# Patient Record
Sex: Male | Born: 1940 | Race: White | Hispanic: No | Marital: Married | Smoking: Former smoker
Health system: Southern US, Community
[De-identification: ages and names within clinical notes are randomized; demographics above are authoritative.]

## PROBLEM LIST (undated history)

## (undated) ENCOUNTER — Ambulatory Visit (HOSPITAL_BASED_OUTPATIENT_CLINIC_OR_DEPARTMENT_OTHER)

## (undated) DIAGNOSIS — I499 Cardiac arrhythmia, unspecified: Secondary | ICD-10-CM

## (undated) DIAGNOSIS — I2510 Atherosclerotic heart disease of native coronary artery without angina pectoris: Secondary | ICD-10-CM

## (undated) DIAGNOSIS — I509 Heart failure, unspecified: Secondary | ICD-10-CM

## (undated) DIAGNOSIS — R55 Syncope and collapse: Secondary | ICD-10-CM

## (undated) DIAGNOSIS — I10 Essential (primary) hypertension: Secondary | ICD-10-CM

## (undated) DIAGNOSIS — E039 Hypothyroidism, unspecified: Secondary | ICD-10-CM

## (undated) DIAGNOSIS — I2699 Other pulmonary embolism without acute cor pulmonale: Secondary | ICD-10-CM

## (undated) DIAGNOSIS — K219 Gastro-esophageal reflux disease without esophagitis: Secondary | ICD-10-CM

## (undated) DIAGNOSIS — R0600 Dyspnea, unspecified: Secondary | ICD-10-CM

## (undated) DIAGNOSIS — G4733 Obstructive sleep apnea (adult) (pediatric): Secondary | ICD-10-CM

## (undated) DIAGNOSIS — I219 Acute myocardial infarction, unspecified: Secondary | ICD-10-CM

## (undated) HISTORY — DX: Obstructive sleep apnea (adult) (pediatric): G47.33

## (undated) HISTORY — PX: CORONARY STENT PLACEMENT: SHX1402

## (undated) HISTORY — DX: Hypothyroidism, unspecified: E03.9

## (undated) HISTORY — DX: Other pulmonary embolism without acute cor pulmonale: I26.99

## (undated) HISTORY — DX: Syncope and collapse: R55

## (undated) HISTORY — DX: Essential (primary) hypertension: I10

## (undated) HISTORY — DX: Heart failure, unspecified: I50.9

## (undated) HISTORY — DX: Gastro-esophageal reflux disease without esophagitis: K21.9

---
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
## 1977-11-09 HISTORY — PX: HERNIA REPAIR: SHX51

## 2010-06-11 HISTORY — PX: CATARACT EXTRACTION: SUR2

## 2011-06-12 HISTORY — PX: COLONOSCOPY: SHX174

## 2011-06-18 DIAGNOSIS — M25559 Pain in unspecified hip: Secondary | ICD-10-CM | POA: Diagnosis not present

## 2011-06-18 DIAGNOSIS — K2100 Gastro-esophageal reflux disease with esophagitis, without bleeding: Secondary | ICD-10-CM | POA: Diagnosis not present

## 2011-06-18 DIAGNOSIS — K5732 Diverticulitis of large intestine without perforation or abscess without bleeding: Secondary | ICD-10-CM | POA: Diagnosis not present

## 2011-08-03 DIAGNOSIS — J45909 Unspecified asthma, uncomplicated: Secondary | ICD-10-CM | POA: Diagnosis not present

## 2011-08-03 DIAGNOSIS — J309 Allergic rhinitis, unspecified: Secondary | ICD-10-CM | POA: Diagnosis not present

## 2011-08-03 DIAGNOSIS — G4710 Hypersomnia, unspecified: Secondary | ICD-10-CM | POA: Diagnosis not present

## 2011-08-03 DIAGNOSIS — G4730 Sleep apnea, unspecified: Secondary | ICD-10-CM | POA: Diagnosis not present

## 2011-08-03 DIAGNOSIS — G2581 Restless legs syndrome: Secondary | ICD-10-CM | POA: Diagnosis not present

## 2011-08-06 DIAGNOSIS — G4730 Sleep apnea, unspecified: Secondary | ICD-10-CM | POA: Diagnosis not present

## 2011-08-06 DIAGNOSIS — G4710 Hypersomnia, unspecified: Secondary | ICD-10-CM | POA: Diagnosis not present

## 2011-08-21 DIAGNOSIS — L570 Actinic keratosis: Secondary | ICD-10-CM | POA: Diagnosis not present

## 2011-08-21 DIAGNOSIS — C44319 Basal cell carcinoma of skin of other parts of face: Secondary | ICD-10-CM | POA: Diagnosis not present

## 2011-08-28 DIAGNOSIS — K5900 Constipation, unspecified: Secondary | ICD-10-CM | POA: Diagnosis not present

## 2011-08-28 DIAGNOSIS — K5732 Diverticulitis of large intestine without perforation or abscess without bleeding: Secondary | ICD-10-CM | POA: Diagnosis not present

## 2011-08-28 DIAGNOSIS — K625 Hemorrhage of anus and rectum: Secondary | ICD-10-CM | POA: Diagnosis not present

## 2011-09-03 DIAGNOSIS — Z0000 Encounter for general adult medical examination without abnormal findings: Secondary | ICD-10-CM | POA: Diagnosis not present

## 2011-09-17 DIAGNOSIS — R5381 Other malaise: Secondary | ICD-10-CM | POA: Diagnosis not present

## 2011-09-24 DIAGNOSIS — Z86010 Personal history of colonic polyps: Secondary | ICD-10-CM | POA: Diagnosis not present

## 2011-09-24 DIAGNOSIS — M129 Arthropathy, unspecified: Secondary | ICD-10-CM | POA: Diagnosis not present

## 2011-09-24 DIAGNOSIS — I10 Essential (primary) hypertension: Secondary | ICD-10-CM | POA: Diagnosis not present

## 2011-09-24 DIAGNOSIS — E039 Hypothyroidism, unspecified: Secondary | ICD-10-CM | POA: Diagnosis not present

## 2011-09-24 DIAGNOSIS — K5730 Diverticulosis of large intestine without perforation or abscess without bleeding: Secondary | ICD-10-CM | POA: Diagnosis not present

## 2011-09-24 DIAGNOSIS — Z9049 Acquired absence of other specified parts of digestive tract: Secondary | ICD-10-CM | POA: Diagnosis not present

## 2011-09-24 DIAGNOSIS — K219 Gastro-esophageal reflux disease without esophagitis: Secondary | ICD-10-CM | POA: Diagnosis not present

## 2011-09-24 DIAGNOSIS — K625 Hemorrhage of anus and rectum: Secondary | ICD-10-CM | POA: Diagnosis not present

## 2011-09-24 DIAGNOSIS — Z79899 Other long term (current) drug therapy: Secondary | ICD-10-CM | POA: Diagnosis not present

## 2011-09-24 DIAGNOSIS — Z7982 Long term (current) use of aspirin: Secondary | ICD-10-CM | POA: Diagnosis not present

## 2011-09-24 DIAGNOSIS — K648 Other hemorrhoids: Secondary | ICD-10-CM | POA: Diagnosis not present

## 2011-09-24 DIAGNOSIS — E7800 Pure hypercholesterolemia, unspecified: Secondary | ICD-10-CM | POA: Diagnosis not present

## 2011-09-24 DIAGNOSIS — K5900 Constipation, unspecified: Secondary | ICD-10-CM | POA: Diagnosis not present

## 2011-10-17 DIAGNOSIS — H1030 Unspecified acute conjunctivitis, unspecified eye: Secondary | ICD-10-CM | POA: Diagnosis not present

## 2011-10-24 DIAGNOSIS — K5900 Constipation, unspecified: Secondary | ICD-10-CM | POA: Diagnosis not present

## 2011-10-24 DIAGNOSIS — K5730 Diverticulosis of large intestine without perforation or abscess without bleeding: Secondary | ICD-10-CM | POA: Diagnosis not present

## 2011-10-24 DIAGNOSIS — K625 Hemorrhage of anus and rectum: Secondary | ICD-10-CM | POA: Diagnosis not present

## 2011-11-02 DIAGNOSIS — G2581 Restless legs syndrome: Secondary | ICD-10-CM | POA: Diagnosis not present

## 2011-11-02 DIAGNOSIS — G4710 Hypersomnia, unspecified: Secondary | ICD-10-CM | POA: Diagnosis not present

## 2011-11-02 DIAGNOSIS — J069 Acute upper respiratory infection, unspecified: Secondary | ICD-10-CM | POA: Diagnosis not present

## 2011-11-02 DIAGNOSIS — G4730 Sleep apnea, unspecified: Secondary | ICD-10-CM | POA: Diagnosis not present

## 2011-11-02 DIAGNOSIS — J45909 Unspecified asthma, uncomplicated: Secondary | ICD-10-CM | POA: Diagnosis not present

## 2011-11-06 DIAGNOSIS — G4710 Hypersomnia, unspecified: Secondary | ICD-10-CM | POA: Diagnosis not present

## 2011-11-19 DIAGNOSIS — I10 Essential (primary) hypertension: Secondary | ICD-10-CM | POA: Diagnosis not present

## 2011-11-19 DIAGNOSIS — R059 Cough, unspecified: Secondary | ICD-10-CM | POA: Diagnosis not present

## 2011-11-19 DIAGNOSIS — R05 Cough: Secondary | ICD-10-CM | POA: Diagnosis not present

## 2011-11-19 DIAGNOSIS — J309 Allergic rhinitis, unspecified: Secondary | ICD-10-CM | POA: Diagnosis not present

## 2011-11-19 DIAGNOSIS — E782 Mixed hyperlipidemia: Secondary | ICD-10-CM | POA: Diagnosis not present

## 2011-12-03 DIAGNOSIS — R05 Cough: Secondary | ICD-10-CM | POA: Diagnosis not present

## 2011-12-03 DIAGNOSIS — R059 Cough, unspecified: Secondary | ICD-10-CM | POA: Diagnosis not present

## 2011-12-24 DIAGNOSIS — R05 Cough: Secondary | ICD-10-CM | POA: Diagnosis not present

## 2011-12-24 DIAGNOSIS — K2100 Gastro-esophageal reflux disease with esophagitis, without bleeding: Secondary | ICD-10-CM | POA: Diagnosis not present

## 2011-12-24 DIAGNOSIS — J45991 Cough variant asthma: Secondary | ICD-10-CM | POA: Diagnosis not present

## 2011-12-24 DIAGNOSIS — R059 Cough, unspecified: Secondary | ICD-10-CM | POA: Diagnosis not present

## 2012-02-08 DIAGNOSIS — R1310 Dysphagia, unspecified: Secondary | ICD-10-CM | POA: Diagnosis not present

## 2012-02-08 DIAGNOSIS — K219 Gastro-esophageal reflux disease without esophagitis: Secondary | ICD-10-CM | POA: Diagnosis not present

## 2012-02-21 DIAGNOSIS — E7800 Pure hypercholesterolemia, unspecified: Secondary | ICD-10-CM | POA: Diagnosis not present

## 2012-02-21 DIAGNOSIS — Z7982 Long term (current) use of aspirin: Secondary | ICD-10-CM | POA: Diagnosis not present

## 2012-02-21 DIAGNOSIS — K2960 Other gastritis without bleeding: Secondary | ICD-10-CM | POA: Diagnosis not present

## 2012-02-21 DIAGNOSIS — E039 Hypothyroidism, unspecified: Secondary | ICD-10-CM | POA: Diagnosis not present

## 2012-02-21 DIAGNOSIS — R1310 Dysphagia, unspecified: Secondary | ICD-10-CM | POA: Diagnosis not present

## 2012-02-21 DIAGNOSIS — J45909 Unspecified asthma, uncomplicated: Secondary | ICD-10-CM | POA: Diagnosis not present

## 2012-02-21 DIAGNOSIS — K219 Gastro-esophageal reflux disease without esophagitis: Secondary | ICD-10-CM | POA: Diagnosis not present

## 2012-02-21 DIAGNOSIS — I10 Essential (primary) hypertension: Secondary | ICD-10-CM | POA: Diagnosis not present

## 2012-02-21 DIAGNOSIS — K222 Esophageal obstruction: Secondary | ICD-10-CM | POA: Diagnosis not present

## 2012-02-21 DIAGNOSIS — Z79899 Other long term (current) drug therapy: Secondary | ICD-10-CM | POA: Diagnosis not present

## 2012-02-21 DIAGNOSIS — R1013 Epigastric pain: Secondary | ICD-10-CM | POA: Diagnosis not present

## 2012-03-10 DIAGNOSIS — L570 Actinic keratosis: Secondary | ICD-10-CM | POA: Diagnosis not present

## 2012-03-14 DIAGNOSIS — R059 Cough, unspecified: Secondary | ICD-10-CM | POA: Diagnosis not present

## 2012-03-14 DIAGNOSIS — I10 Essential (primary) hypertension: Secondary | ICD-10-CM | POA: Diagnosis not present

## 2012-03-14 DIAGNOSIS — E039 Hypothyroidism, unspecified: Secondary | ICD-10-CM | POA: Diagnosis not present

## 2012-03-14 DIAGNOSIS — R05 Cough: Secondary | ICD-10-CM | POA: Diagnosis not present

## 2012-03-14 DIAGNOSIS — Z23 Encounter for immunization: Secondary | ICD-10-CM | POA: Diagnosis not present

## 2012-03-19 DIAGNOSIS — H43819 Vitreous degeneration, unspecified eye: Secondary | ICD-10-CM | POA: Diagnosis not present

## 2012-03-19 DIAGNOSIS — H01009 Unspecified blepharitis unspecified eye, unspecified eyelid: Secondary | ICD-10-CM | POA: Diagnosis not present

## 2012-04-08 DIAGNOSIS — J45909 Unspecified asthma, uncomplicated: Secondary | ICD-10-CM | POA: Diagnosis not present

## 2012-04-08 DIAGNOSIS — G2581 Restless legs syndrome: Secondary | ICD-10-CM | POA: Diagnosis not present

## 2012-04-08 DIAGNOSIS — K219 Gastro-esophageal reflux disease without esophagitis: Secondary | ICD-10-CM | POA: Diagnosis not present

## 2012-04-08 DIAGNOSIS — G4710 Hypersomnia, unspecified: Secondary | ICD-10-CM | POA: Diagnosis not present

## 2012-04-09 DIAGNOSIS — J45909 Unspecified asthma, uncomplicated: Secondary | ICD-10-CM | POA: Diagnosis not present

## 2012-04-09 DIAGNOSIS — G4710 Hypersomnia, unspecified: Secondary | ICD-10-CM | POA: Diagnosis not present

## 2012-04-09 DIAGNOSIS — G2581 Restless legs syndrome: Secondary | ICD-10-CM | POA: Diagnosis not present

## 2012-04-09 DIAGNOSIS — G4730 Sleep apnea, unspecified: Secondary | ICD-10-CM | POA: Diagnosis not present

## 2012-04-09 DIAGNOSIS — K219 Gastro-esophageal reflux disease without esophagitis: Secondary | ICD-10-CM | POA: Diagnosis not present

## 2012-04-28 DIAGNOSIS — R1084 Generalized abdominal pain: Secondary | ICD-10-CM | POA: Diagnosis not present

## 2012-04-28 DIAGNOSIS — R197 Diarrhea, unspecified: Secondary | ICD-10-CM | POA: Diagnosis not present

## 2012-04-29 DIAGNOSIS — R197 Diarrhea, unspecified: Secondary | ICD-10-CM | POA: Diagnosis not present

## 2012-04-29 DIAGNOSIS — R109 Unspecified abdominal pain: Secondary | ICD-10-CM | POA: Diagnosis not present

## 2012-08-04 DIAGNOSIS — G2581 Restless legs syndrome: Secondary | ICD-10-CM | POA: Diagnosis not present

## 2012-08-04 DIAGNOSIS — G4730 Sleep apnea, unspecified: Secondary | ICD-10-CM | POA: Diagnosis not present

## 2012-08-04 DIAGNOSIS — G4710 Hypersomnia, unspecified: Secondary | ICD-10-CM | POA: Diagnosis not present

## 2012-08-04 DIAGNOSIS — K219 Gastro-esophageal reflux disease without esophagitis: Secondary | ICD-10-CM | POA: Diagnosis not present

## 2012-08-04 DIAGNOSIS — J45909 Unspecified asthma, uncomplicated: Secondary | ICD-10-CM | POA: Diagnosis not present

## 2012-08-05 DIAGNOSIS — G4710 Hypersomnia, unspecified: Secondary | ICD-10-CM | POA: Diagnosis not present

## 2012-08-06 DIAGNOSIS — S058X9A Other injuries of unspecified eye and orbit, initial encounter: Secondary | ICD-10-CM | POA: Diagnosis not present

## 2012-08-06 DIAGNOSIS — I10 Essential (primary) hypertension: Secondary | ICD-10-CM | POA: Diagnosis not present

## 2012-08-06 DIAGNOSIS — E039 Hypothyroidism, unspecified: Secondary | ICD-10-CM | POA: Diagnosis not present

## 2012-08-06 DIAGNOSIS — IMO0002 Reserved for concepts with insufficient information to code with codable children: Secondary | ICD-10-CM | POA: Diagnosis not present

## 2012-08-06 DIAGNOSIS — T2610XA Burn of cornea and conjunctival sac, unspecified eye, initial encounter: Secondary | ICD-10-CM | POA: Diagnosis not present

## 2012-08-06 DIAGNOSIS — T2660XA Corrosion of cornea and conjunctival sac, unspecified eye, initial encounter: Secondary | ICD-10-CM | POA: Diagnosis not present

## 2012-08-06 DIAGNOSIS — J45909 Unspecified asthma, uncomplicated: Secondary | ICD-10-CM | POA: Diagnosis not present

## 2012-08-08 DIAGNOSIS — H16109 Unspecified superficial keratitis, unspecified eye: Secondary | ICD-10-CM | POA: Diagnosis not present

## 2012-08-27 DIAGNOSIS — M79609 Pain in unspecified limb: Secondary | ICD-10-CM | POA: Diagnosis not present

## 2012-08-27 DIAGNOSIS — M5030 Other cervical disc degeneration, unspecified cervical region: Secondary | ICD-10-CM | POA: Diagnosis not present

## 2012-08-27 DIAGNOSIS — M542 Cervicalgia: Secondary | ICD-10-CM | POA: Diagnosis not present

## 2012-09-01 DIAGNOSIS — G4730 Sleep apnea, unspecified: Secondary | ICD-10-CM | POA: Diagnosis not present

## 2012-09-01 DIAGNOSIS — Z006 Encounter for examination for normal comparison and control in clinical research program: Secondary | ICD-10-CM | POA: Diagnosis not present

## 2012-09-01 DIAGNOSIS — R5381 Other malaise: Secondary | ICD-10-CM | POA: Diagnosis not present

## 2012-09-01 DIAGNOSIS — R0609 Other forms of dyspnea: Secondary | ICD-10-CM | POA: Diagnosis not present

## 2012-09-01 DIAGNOSIS — J3089 Other allergic rhinitis: Secondary | ICD-10-CM | POA: Diagnosis not present

## 2012-09-01 DIAGNOSIS — G4710 Hypersomnia, unspecified: Secondary | ICD-10-CM | POA: Diagnosis not present

## 2012-09-01 DIAGNOSIS — K2100 Gastro-esophageal reflux disease with esophagitis, without bleeding: Secondary | ICD-10-CM | POA: Diagnosis not present

## 2012-09-01 DIAGNOSIS — J45909 Unspecified asthma, uncomplicated: Secondary | ICD-10-CM | POA: Diagnosis not present

## 2012-09-01 DIAGNOSIS — G2581 Restless legs syndrome: Secondary | ICD-10-CM | POA: Diagnosis not present

## 2012-09-01 DIAGNOSIS — R0989 Other specified symptoms and signs involving the circulatory and respiratory systems: Secondary | ICD-10-CM | POA: Diagnosis not present

## 2012-09-02 DIAGNOSIS — G4710 Hypersomnia, unspecified: Secondary | ICD-10-CM | POA: Diagnosis not present

## 2012-09-02 DIAGNOSIS — G4730 Sleep apnea, unspecified: Secondary | ICD-10-CM | POA: Diagnosis not present

## 2012-09-03 DIAGNOSIS — M542 Cervicalgia: Secondary | ICD-10-CM | POA: Diagnosis not present

## 2012-09-05 DIAGNOSIS — G5620 Lesion of ulnar nerve, unspecified upper limb: Secondary | ICD-10-CM | POA: Diagnosis not present

## 2012-09-05 DIAGNOSIS — G5600 Carpal tunnel syndrome, unspecified upper limb: Secondary | ICD-10-CM | POA: Diagnosis not present

## 2012-09-05 DIAGNOSIS — M48061 Spinal stenosis, lumbar region without neurogenic claudication: Secondary | ICD-10-CM | POA: Diagnosis not present

## 2012-09-05 DIAGNOSIS — M5137 Other intervertebral disc degeneration, lumbosacral region: Secondary | ICD-10-CM | POA: Diagnosis not present

## 2012-09-05 DIAGNOSIS — M5126 Other intervertebral disc displacement, lumbar region: Secondary | ICD-10-CM | POA: Diagnosis not present

## 2012-09-05 DIAGNOSIS — M546 Pain in thoracic spine: Secondary | ICD-10-CM | POA: Diagnosis not present

## 2012-09-09 DIAGNOSIS — M4802 Spinal stenosis, cervical region: Secondary | ICD-10-CM | POA: Diagnosis not present

## 2012-09-09 DIAGNOSIS — G5600 Carpal tunnel syndrome, unspecified upper limb: Secondary | ICD-10-CM | POA: Diagnosis not present

## 2012-09-09 DIAGNOSIS — G5620 Lesion of ulnar nerve, unspecified upper limb: Secondary | ICD-10-CM | POA: Diagnosis not present

## 2012-09-10 ENCOUNTER — Other Ambulatory Visit: Payer: Self-pay | Admitting: Neurosurgery

## 2012-09-10 DIAGNOSIS — M48061 Spinal stenosis, lumbar region without neurogenic claudication: Secondary | ICD-10-CM

## 2012-09-10 DIAGNOSIS — M5126 Other intervertebral disc displacement, lumbar region: Secondary | ICD-10-CM

## 2012-09-10 DIAGNOSIS — M4802 Spinal stenosis, cervical region: Secondary | ICD-10-CM

## 2012-09-10 DIAGNOSIS — M542 Cervicalgia: Secondary | ICD-10-CM

## 2012-09-14 ENCOUNTER — Ambulatory Visit
Admission: RE | Admit: 2012-09-14 | Discharge: 2012-09-14 | Disposition: A | Discharge status: Home / Self Care | Payer: Medicare Other | Source: Ambulatory Visit | Attending: Neurosurgery | Admitting: Neurosurgery

## 2012-09-14 DIAGNOSIS — M5020 Other cervical disc displacement, unspecified cervical region: Secondary | ICD-10-CM | POA: Diagnosis not present

## 2012-09-14 DIAGNOSIS — M5126 Other intervertebral disc displacement, lumbar region: Secondary | ICD-10-CM

## 2012-09-14 DIAGNOSIS — M47817 Spondylosis without myelopathy or radiculopathy, lumbosacral region: Secondary | ICD-10-CM | POA: Diagnosis not present

## 2012-09-14 DIAGNOSIS — M542 Cervicalgia: Secondary | ICD-10-CM

## 2012-09-14 DIAGNOSIS — M5030 Other cervical disc degeneration, unspecified cervical region: Secondary | ICD-10-CM | POA: Diagnosis not present

## 2012-09-14 DIAGNOSIS — M48061 Spinal stenosis, lumbar region without neurogenic claudication: Secondary | ICD-10-CM

## 2012-09-14 DIAGNOSIS — M5137 Other intervertebral disc degeneration, lumbosacral region: Secondary | ICD-10-CM | POA: Diagnosis not present

## 2012-09-14 DIAGNOSIS — M4802 Spinal stenosis, cervical region: Secondary | ICD-10-CM

## 2012-09-14 DIAGNOSIS — M47812 Spondylosis without myelopathy or radiculopathy, cervical region: Secondary | ICD-10-CM | POA: Diagnosis not present

## 2012-09-15 DIAGNOSIS — M5137 Other intervertebral disc degeneration, lumbosacral region: Secondary | ICD-10-CM | POA: Diagnosis not present

## 2012-09-15 DIAGNOSIS — G5600 Carpal tunnel syndrome, unspecified upper limb: Secondary | ICD-10-CM | POA: Diagnosis not present

## 2012-09-15 DIAGNOSIS — M5126 Other intervertebral disc displacement, lumbar region: Secondary | ICD-10-CM | POA: Diagnosis not present

## 2012-09-15 DIAGNOSIS — M47812 Spondylosis without myelopathy or radiculopathy, cervical region: Secondary | ICD-10-CM | POA: Diagnosis not present

## 2012-09-25 DIAGNOSIS — M5126 Other intervertebral disc displacement, lumbar region: Secondary | ICD-10-CM | POA: Diagnosis not present

## 2012-11-07 DIAGNOSIS — L570 Actinic keratosis: Secondary | ICD-10-CM | POA: Diagnosis not present

## 2012-11-25 DIAGNOSIS — M5137 Other intervertebral disc degeneration, lumbosacral region: Secondary | ICD-10-CM | POA: Diagnosis not present

## 2012-11-25 DIAGNOSIS — M5126 Other intervertebral disc displacement, lumbar region: Secondary | ICD-10-CM | POA: Diagnosis not present

## 2012-11-25 DIAGNOSIS — M47817 Spondylosis without myelopathy or radiculopathy, lumbosacral region: Secondary | ICD-10-CM | POA: Diagnosis not present

## 2012-11-26 DIAGNOSIS — R293 Abnormal posture: Secondary | ICD-10-CM | POA: Diagnosis not present

## 2012-11-26 DIAGNOSIS — M6281 Muscle weakness (generalized): Secondary | ICD-10-CM | POA: Diagnosis not present

## 2012-11-26 DIAGNOSIS — M2560 Stiffness of unspecified joint, not elsewhere classified: Secondary | ICD-10-CM | POA: Diagnosis not present

## 2012-11-26 DIAGNOSIS — R269 Unspecified abnormalities of gait and mobility: Secondary | ICD-10-CM | POA: Diagnosis not present

## 2012-11-26 DIAGNOSIS — IMO0001 Reserved for inherently not codable concepts without codable children: Secondary | ICD-10-CM | POA: Diagnosis not present

## 2012-11-26 DIAGNOSIS — M5126 Other intervertebral disc displacement, lumbar region: Secondary | ICD-10-CM | POA: Diagnosis not present

## 2012-12-02 DIAGNOSIS — J45909 Unspecified asthma, uncomplicated: Secondary | ICD-10-CM | POA: Diagnosis not present

## 2012-12-02 DIAGNOSIS — K219 Gastro-esophageal reflux disease without esophagitis: Secondary | ICD-10-CM | POA: Diagnosis not present

## 2012-12-02 DIAGNOSIS — J309 Allergic rhinitis, unspecified: Secondary | ICD-10-CM | POA: Diagnosis not present

## 2012-12-02 DIAGNOSIS — G4710 Hypersomnia, unspecified: Secondary | ICD-10-CM | POA: Diagnosis not present

## 2012-12-02 DIAGNOSIS — G2581 Restless legs syndrome: Secondary | ICD-10-CM | POA: Diagnosis not present

## 2012-12-03 DIAGNOSIS — R269 Unspecified abnormalities of gait and mobility: Secondary | ICD-10-CM | POA: Diagnosis not present

## 2012-12-03 DIAGNOSIS — R293 Abnormal posture: Secondary | ICD-10-CM | POA: Diagnosis not present

## 2012-12-03 DIAGNOSIS — IMO0001 Reserved for inherently not codable concepts without codable children: Secondary | ICD-10-CM | POA: Diagnosis not present

## 2012-12-03 DIAGNOSIS — M5126 Other intervertebral disc displacement, lumbar region: Secondary | ICD-10-CM | POA: Diagnosis not present

## 2012-12-03 DIAGNOSIS — M2560 Stiffness of unspecified joint, not elsewhere classified: Secondary | ICD-10-CM | POA: Diagnosis not present

## 2012-12-03 DIAGNOSIS — G4710 Hypersomnia, unspecified: Secondary | ICD-10-CM | POA: Diagnosis not present

## 2012-12-03 DIAGNOSIS — M6281 Muscle weakness (generalized): Secondary | ICD-10-CM | POA: Diagnosis not present

## 2012-12-05 DIAGNOSIS — IMO0001 Reserved for inherently not codable concepts without codable children: Secondary | ICD-10-CM | POA: Diagnosis not present

## 2012-12-05 DIAGNOSIS — R269 Unspecified abnormalities of gait and mobility: Secondary | ICD-10-CM | POA: Diagnosis not present

## 2012-12-05 DIAGNOSIS — M5126 Other intervertebral disc displacement, lumbar region: Secondary | ICD-10-CM | POA: Diagnosis not present

## 2012-12-05 DIAGNOSIS — M2560 Stiffness of unspecified joint, not elsewhere classified: Secondary | ICD-10-CM | POA: Diagnosis not present

## 2012-12-05 DIAGNOSIS — R293 Abnormal posture: Secondary | ICD-10-CM | POA: Diagnosis not present

## 2012-12-05 DIAGNOSIS — M6281 Muscle weakness (generalized): Secondary | ICD-10-CM | POA: Diagnosis not present

## 2012-12-08 DIAGNOSIS — IMO0001 Reserved for inherently not codable concepts without codable children: Secondary | ICD-10-CM | POA: Diagnosis not present

## 2012-12-08 DIAGNOSIS — M2560 Stiffness of unspecified joint, not elsewhere classified: Secondary | ICD-10-CM | POA: Diagnosis not present

## 2012-12-08 DIAGNOSIS — R269 Unspecified abnormalities of gait and mobility: Secondary | ICD-10-CM | POA: Diagnosis not present

## 2012-12-08 DIAGNOSIS — M5126 Other intervertebral disc displacement, lumbar region: Secondary | ICD-10-CM | POA: Diagnosis not present

## 2012-12-08 DIAGNOSIS — M6281 Muscle weakness (generalized): Secondary | ICD-10-CM | POA: Diagnosis not present

## 2012-12-08 DIAGNOSIS — R293 Abnormal posture: Secondary | ICD-10-CM | POA: Diagnosis not present

## 2012-12-17 DIAGNOSIS — M6281 Muscle weakness (generalized): Secondary | ICD-10-CM | POA: Diagnosis not present

## 2012-12-17 DIAGNOSIS — M5126 Other intervertebral disc displacement, lumbar region: Secondary | ICD-10-CM | POA: Diagnosis not present

## 2012-12-17 DIAGNOSIS — M2560 Stiffness of unspecified joint, not elsewhere classified: Secondary | ICD-10-CM | POA: Diagnosis not present

## 2012-12-17 DIAGNOSIS — R293 Abnormal posture: Secondary | ICD-10-CM | POA: Diagnosis not present

## 2012-12-17 DIAGNOSIS — R269 Unspecified abnormalities of gait and mobility: Secondary | ICD-10-CM | POA: Diagnosis not present

## 2012-12-17 DIAGNOSIS — IMO0001 Reserved for inherently not codable concepts without codable children: Secondary | ICD-10-CM | POA: Diagnosis not present

## 2012-12-19 DIAGNOSIS — R293 Abnormal posture: Secondary | ICD-10-CM | POA: Diagnosis not present

## 2012-12-19 DIAGNOSIS — M6281 Muscle weakness (generalized): Secondary | ICD-10-CM | POA: Diagnosis not present

## 2012-12-19 DIAGNOSIS — R269 Unspecified abnormalities of gait and mobility: Secondary | ICD-10-CM | POA: Diagnosis not present

## 2012-12-19 DIAGNOSIS — M2560 Stiffness of unspecified joint, not elsewhere classified: Secondary | ICD-10-CM | POA: Diagnosis not present

## 2012-12-19 DIAGNOSIS — IMO0001 Reserved for inherently not codable concepts without codable children: Secondary | ICD-10-CM | POA: Diagnosis not present

## 2012-12-19 DIAGNOSIS — M5126 Other intervertebral disc displacement, lumbar region: Secondary | ICD-10-CM | POA: Diagnosis not present

## 2012-12-24 DIAGNOSIS — M6281 Muscle weakness (generalized): Secondary | ICD-10-CM | POA: Diagnosis not present

## 2012-12-24 DIAGNOSIS — R293 Abnormal posture: Secondary | ICD-10-CM | POA: Diagnosis not present

## 2012-12-24 DIAGNOSIS — IMO0001 Reserved for inherently not codable concepts without codable children: Secondary | ICD-10-CM | POA: Diagnosis not present

## 2012-12-24 DIAGNOSIS — R269 Unspecified abnormalities of gait and mobility: Secondary | ICD-10-CM | POA: Diagnosis not present

## 2012-12-24 DIAGNOSIS — M5126 Other intervertebral disc displacement, lumbar region: Secondary | ICD-10-CM | POA: Diagnosis not present

## 2012-12-24 DIAGNOSIS — M2560 Stiffness of unspecified joint, not elsewhere classified: Secondary | ICD-10-CM | POA: Diagnosis not present

## 2012-12-26 DIAGNOSIS — M5126 Other intervertebral disc displacement, lumbar region: Secondary | ICD-10-CM | POA: Diagnosis not present

## 2012-12-26 DIAGNOSIS — IMO0001 Reserved for inherently not codable concepts without codable children: Secondary | ICD-10-CM | POA: Diagnosis not present

## 2012-12-26 DIAGNOSIS — M6281 Muscle weakness (generalized): Secondary | ICD-10-CM | POA: Diagnosis not present

## 2012-12-26 DIAGNOSIS — R269 Unspecified abnormalities of gait and mobility: Secondary | ICD-10-CM | POA: Diagnosis not present

## 2012-12-26 DIAGNOSIS — M2560 Stiffness of unspecified joint, not elsewhere classified: Secondary | ICD-10-CM | POA: Diagnosis not present

## 2012-12-26 DIAGNOSIS — R293 Abnormal posture: Secondary | ICD-10-CM | POA: Diagnosis not present

## 2012-12-31 DIAGNOSIS — R293 Abnormal posture: Secondary | ICD-10-CM | POA: Diagnosis not present

## 2012-12-31 DIAGNOSIS — M6281 Muscle weakness (generalized): Secondary | ICD-10-CM | POA: Diagnosis not present

## 2012-12-31 DIAGNOSIS — M2560 Stiffness of unspecified joint, not elsewhere classified: Secondary | ICD-10-CM | POA: Diagnosis not present

## 2012-12-31 DIAGNOSIS — R269 Unspecified abnormalities of gait and mobility: Secondary | ICD-10-CM | POA: Diagnosis not present

## 2012-12-31 DIAGNOSIS — M5126 Other intervertebral disc displacement, lumbar region: Secondary | ICD-10-CM | POA: Diagnosis not present

## 2012-12-31 DIAGNOSIS — IMO0001 Reserved for inherently not codable concepts without codable children: Secondary | ICD-10-CM | POA: Diagnosis not present

## 2013-01-02 DIAGNOSIS — R269 Unspecified abnormalities of gait and mobility: Secondary | ICD-10-CM | POA: Diagnosis not present

## 2013-01-02 DIAGNOSIS — R293 Abnormal posture: Secondary | ICD-10-CM | POA: Diagnosis not present

## 2013-01-02 DIAGNOSIS — M6281 Muscle weakness (generalized): Secondary | ICD-10-CM | POA: Diagnosis not present

## 2013-01-02 DIAGNOSIS — IMO0001 Reserved for inherently not codable concepts without codable children: Secondary | ICD-10-CM | POA: Diagnosis not present

## 2013-01-02 DIAGNOSIS — M2560 Stiffness of unspecified joint, not elsewhere classified: Secondary | ICD-10-CM | POA: Diagnosis not present

## 2013-01-02 DIAGNOSIS — M5126 Other intervertebral disc displacement, lumbar region: Secondary | ICD-10-CM | POA: Diagnosis not present

## 2013-01-07 DIAGNOSIS — IMO0001 Reserved for inherently not codable concepts without codable children: Secondary | ICD-10-CM | POA: Diagnosis not present

## 2013-01-07 DIAGNOSIS — M2560 Stiffness of unspecified joint, not elsewhere classified: Secondary | ICD-10-CM | POA: Diagnosis not present

## 2013-01-07 DIAGNOSIS — M5126 Other intervertebral disc displacement, lumbar region: Secondary | ICD-10-CM | POA: Diagnosis not present

## 2013-01-07 DIAGNOSIS — R293 Abnormal posture: Secondary | ICD-10-CM | POA: Diagnosis not present

## 2013-01-07 DIAGNOSIS — M6281 Muscle weakness (generalized): Secondary | ICD-10-CM | POA: Diagnosis not present

## 2013-01-07 DIAGNOSIS — R269 Unspecified abnormalities of gait and mobility: Secondary | ICD-10-CM | POA: Diagnosis not present

## 2013-01-20 DIAGNOSIS — M5126 Other intervertebral disc displacement, lumbar region: Secondary | ICD-10-CM | POA: Diagnosis not present

## 2013-01-20 DIAGNOSIS — M5137 Other intervertebral disc degeneration, lumbosacral region: Secondary | ICD-10-CM | POA: Diagnosis not present

## 2013-01-20 DIAGNOSIS — M47817 Spondylosis without myelopathy or radiculopathy, lumbosacral region: Secondary | ICD-10-CM | POA: Diagnosis not present

## 2013-03-16 DIAGNOSIS — L570 Actinic keratosis: Secondary | ICD-10-CM | POA: Diagnosis not present

## 2013-03-25 DIAGNOSIS — Z23 Encounter for immunization: Secondary | ICD-10-CM | POA: Diagnosis not present

## 2013-03-31 DIAGNOSIS — K219 Gastro-esophageal reflux disease without esophagitis: Secondary | ICD-10-CM | POA: Diagnosis not present

## 2013-03-31 DIAGNOSIS — G4710 Hypersomnia, unspecified: Secondary | ICD-10-CM | POA: Diagnosis not present

## 2013-03-31 DIAGNOSIS — G2581 Restless legs syndrome: Secondary | ICD-10-CM | POA: Diagnosis not present

## 2013-03-31 DIAGNOSIS — J45909 Unspecified asthma, uncomplicated: Secondary | ICD-10-CM | POA: Diagnosis not present

## 2013-04-01 DIAGNOSIS — G4710 Hypersomnia, unspecified: Secondary | ICD-10-CM | POA: Diagnosis not present

## 2013-05-06 DIAGNOSIS — Z125 Encounter for screening for malignant neoplasm of prostate: Secondary | ICD-10-CM | POA: Diagnosis not present

## 2013-05-06 DIAGNOSIS — J45909 Unspecified asthma, uncomplicated: Secondary | ICD-10-CM | POA: Diagnosis not present

## 2013-05-06 DIAGNOSIS — G2581 Restless legs syndrome: Secondary | ICD-10-CM | POA: Diagnosis not present

## 2013-05-06 DIAGNOSIS — E039 Hypothyroidism, unspecified: Secondary | ICD-10-CM | POA: Diagnosis not present

## 2013-05-06 DIAGNOSIS — I10 Essential (primary) hypertension: Secondary | ICD-10-CM | POA: Diagnosis not present

## 2013-05-06 DIAGNOSIS — Z23 Encounter for immunization: Secondary | ICD-10-CM | POA: Diagnosis not present

## 2013-05-06 DIAGNOSIS — K219 Gastro-esophageal reflux disease without esophagitis: Secondary | ICD-10-CM | POA: Diagnosis not present

## 2013-05-06 DIAGNOSIS — E782 Mixed hyperlipidemia: Secondary | ICD-10-CM | POA: Diagnosis not present

## 2013-05-06 DIAGNOSIS — G4710 Hypersomnia, unspecified: Secondary | ICD-10-CM | POA: Diagnosis not present

## 2013-05-08 DIAGNOSIS — G4710 Hypersomnia, unspecified: Secondary | ICD-10-CM | POA: Diagnosis not present

## 2013-05-11 DIAGNOSIS — R7301 Impaired fasting glucose: Secondary | ICD-10-CM | POA: Diagnosis not present

## 2013-05-11 DIAGNOSIS — R7309 Other abnormal glucose: Secondary | ICD-10-CM | POA: Diagnosis not present

## 2013-05-11 DIAGNOSIS — R070 Pain in throat: Secondary | ICD-10-CM | POA: Diagnosis not present

## 2013-05-11 DIAGNOSIS — Z1211 Encounter for screening for malignant neoplasm of colon: Secondary | ICD-10-CM | POA: Diagnosis not present

## 2013-05-11 DIAGNOSIS — Z125 Encounter for screening for malignant neoplasm of prostate: Secondary | ICD-10-CM | POA: Diagnosis not present

## 2013-05-24 DIAGNOSIS — G4710 Hypersomnia, unspecified: Secondary | ICD-10-CM | POA: Diagnosis not present

## 2013-05-26 DIAGNOSIS — Z139 Encounter for screening, unspecified: Secondary | ICD-10-CM | POA: Diagnosis not present

## 2013-05-26 DIAGNOSIS — Z0000 Encounter for general adult medical examination without abnormal findings: Secondary | ICD-10-CM | POA: Diagnosis not present

## 2013-05-26 DIAGNOSIS — Z1331 Encounter for screening for depression: Secondary | ICD-10-CM | POA: Diagnosis not present

## 2013-06-01 DIAGNOSIS — G4710 Hypersomnia, unspecified: Secondary | ICD-10-CM | POA: Diagnosis not present

## 2013-06-01 DIAGNOSIS — J45909 Unspecified asthma, uncomplicated: Secondary | ICD-10-CM | POA: Diagnosis not present

## 2013-06-01 DIAGNOSIS — G2581 Restless legs syndrome: Secondary | ICD-10-CM | POA: Diagnosis not present

## 2013-06-01 DIAGNOSIS — K219 Gastro-esophageal reflux disease without esophagitis: Secondary | ICD-10-CM | POA: Diagnosis not present

## 2013-06-15 DIAGNOSIS — K2100 Gastro-esophageal reflux disease with esophagitis, without bleeding: Secondary | ICD-10-CM | POA: Diagnosis not present

## 2013-06-15 DIAGNOSIS — Z6828 Body mass index (BMI) 28.0-28.9, adult: Secondary | ICD-10-CM | POA: Diagnosis not present

## 2013-06-15 DIAGNOSIS — Z1389 Encounter for screening for other disorder: Secondary | ICD-10-CM | POA: Diagnosis not present

## 2013-07-06 DIAGNOSIS — Z1389 Encounter for screening for other disorder: Secondary | ICD-10-CM | POA: Diagnosis not present

## 2013-07-06 DIAGNOSIS — J029 Acute pharyngitis, unspecified: Secondary | ICD-10-CM | POA: Diagnosis not present

## 2013-07-06 DIAGNOSIS — Z6828 Body mass index (BMI) 28.0-28.9, adult: Secondary | ICD-10-CM | POA: Diagnosis not present

## 2013-09-01 DIAGNOSIS — G2581 Restless legs syndrome: Secondary | ICD-10-CM | POA: Diagnosis not present

## 2013-09-01 DIAGNOSIS — G4730 Sleep apnea, unspecified: Secondary | ICD-10-CM | POA: Diagnosis not present

## 2013-09-01 DIAGNOSIS — K219 Gastro-esophageal reflux disease without esophagitis: Secondary | ICD-10-CM | POA: Diagnosis not present

## 2013-09-01 DIAGNOSIS — J45909 Unspecified asthma, uncomplicated: Secondary | ICD-10-CM | POA: Diagnosis not present

## 2013-09-01 DIAGNOSIS — G4710 Hypersomnia, unspecified: Secondary | ICD-10-CM | POA: Diagnosis not present

## 2013-12-04 DIAGNOSIS — G4730 Sleep apnea, unspecified: Secondary | ICD-10-CM | POA: Diagnosis not present

## 2013-12-04 DIAGNOSIS — G2581 Restless legs syndrome: Secondary | ICD-10-CM | POA: Diagnosis not present

## 2013-12-04 DIAGNOSIS — K219 Gastro-esophageal reflux disease without esophagitis: Secondary | ICD-10-CM | POA: Diagnosis not present

## 2013-12-04 DIAGNOSIS — G4710 Hypersomnia, unspecified: Secondary | ICD-10-CM | POA: Diagnosis not present

## 2013-12-04 DIAGNOSIS — J45909 Unspecified asthma, uncomplicated: Secondary | ICD-10-CM | POA: Diagnosis not present

## 2014-01-14 DIAGNOSIS — N410 Acute prostatitis: Secondary | ICD-10-CM | POA: Diagnosis not present

## 2014-02-08 DIAGNOSIS — H93299 Other abnormal auditory perceptions, unspecified ear: Secondary | ICD-10-CM | POA: Diagnosis not present

## 2014-02-08 DIAGNOSIS — H6980 Other specified disorders of Eustachian tube, unspecified ear: Secondary | ICD-10-CM | POA: Diagnosis not present

## 2014-02-10 DIAGNOSIS — L578 Other skin changes due to chronic exposure to nonionizing radiation: Secondary | ICD-10-CM | POA: Diagnosis not present

## 2014-02-10 DIAGNOSIS — L821 Other seborrheic keratosis: Secondary | ICD-10-CM | POA: Diagnosis not present

## 2014-02-10 DIAGNOSIS — L570 Actinic keratosis: Secondary | ICD-10-CM | POA: Diagnosis not present

## 2014-03-19 DIAGNOSIS — K219 Gastro-esophageal reflux disease without esophagitis: Secondary | ICD-10-CM | POA: Diagnosis not present

## 2014-03-19 DIAGNOSIS — J4520 Mild intermittent asthma, uncomplicated: Secondary | ICD-10-CM | POA: Diagnosis not present

## 2014-03-19 DIAGNOSIS — G4733 Obstructive sleep apnea (adult) (pediatric): Secondary | ICD-10-CM | POA: Diagnosis not present

## 2014-03-19 DIAGNOSIS — G2581 Restless legs syndrome: Secondary | ICD-10-CM | POA: Diagnosis not present

## 2014-03-24 DIAGNOSIS — M9903 Segmental and somatic dysfunction of lumbar region: Secondary | ICD-10-CM | POA: Diagnosis not present

## 2014-03-24 DIAGNOSIS — M9904 Segmental and somatic dysfunction of sacral region: Secondary | ICD-10-CM | POA: Diagnosis not present

## 2014-03-24 DIAGNOSIS — M5136 Other intervertebral disc degeneration, lumbar region: Secondary | ICD-10-CM | POA: Diagnosis not present

## 2014-03-24 DIAGNOSIS — M5441 Lumbago with sciatica, right side: Secondary | ICD-10-CM | POA: Diagnosis not present

## 2014-03-29 DIAGNOSIS — M5136 Other intervertebral disc degeneration, lumbar region: Secondary | ICD-10-CM | POA: Diagnosis not present

## 2014-03-29 DIAGNOSIS — M9903 Segmental and somatic dysfunction of lumbar region: Secondary | ICD-10-CM | POA: Diagnosis not present

## 2014-03-29 DIAGNOSIS — M9904 Segmental and somatic dysfunction of sacral region: Secondary | ICD-10-CM | POA: Diagnosis not present

## 2014-03-29 DIAGNOSIS — M5441 Lumbago with sciatica, right side: Secondary | ICD-10-CM | POA: Diagnosis not present

## 2014-03-30 DIAGNOSIS — M5136 Other intervertebral disc degeneration, lumbar region: Secondary | ICD-10-CM | POA: Diagnosis not present

## 2014-03-30 DIAGNOSIS — M5441 Lumbago with sciatica, right side: Secondary | ICD-10-CM | POA: Diagnosis not present

## 2014-03-30 DIAGNOSIS — M9903 Segmental and somatic dysfunction of lumbar region: Secondary | ICD-10-CM | POA: Diagnosis not present

## 2014-03-30 DIAGNOSIS — M9904 Segmental and somatic dysfunction of sacral region: Secondary | ICD-10-CM | POA: Diagnosis not present

## 2014-04-01 DIAGNOSIS — M5441 Lumbago with sciatica, right side: Secondary | ICD-10-CM | POA: Diagnosis not present

## 2014-04-01 DIAGNOSIS — M9904 Segmental and somatic dysfunction of sacral region: Secondary | ICD-10-CM | POA: Diagnosis not present

## 2014-04-01 DIAGNOSIS — M9903 Segmental and somatic dysfunction of lumbar region: Secondary | ICD-10-CM | POA: Diagnosis not present

## 2014-04-01 DIAGNOSIS — M5136 Other intervertebral disc degeneration, lumbar region: Secondary | ICD-10-CM | POA: Diagnosis not present

## 2014-04-05 DIAGNOSIS — H04123 Dry eye syndrome of bilateral lacrimal glands: Secondary | ICD-10-CM | POA: Diagnosis not present

## 2014-04-05 DIAGNOSIS — M9904 Segmental and somatic dysfunction of sacral region: Secondary | ICD-10-CM | POA: Diagnosis not present

## 2014-04-05 DIAGNOSIS — H43819 Vitreous degeneration, unspecified eye: Secondary | ICD-10-CM | POA: Diagnosis not present

## 2014-04-05 DIAGNOSIS — M5441 Lumbago with sciatica, right side: Secondary | ICD-10-CM | POA: Diagnosis not present

## 2014-04-05 DIAGNOSIS — M9903 Segmental and somatic dysfunction of lumbar region: Secondary | ICD-10-CM | POA: Diagnosis not present

## 2014-04-05 DIAGNOSIS — M5136 Other intervertebral disc degeneration, lumbar region: Secondary | ICD-10-CM | POA: Diagnosis not present

## 2014-04-06 DIAGNOSIS — M9904 Segmental and somatic dysfunction of sacral region: Secondary | ICD-10-CM | POA: Diagnosis not present

## 2014-04-06 DIAGNOSIS — M5136 Other intervertebral disc degeneration, lumbar region: Secondary | ICD-10-CM | POA: Diagnosis not present

## 2014-04-06 DIAGNOSIS — M9903 Segmental and somatic dysfunction of lumbar region: Secondary | ICD-10-CM | POA: Diagnosis not present

## 2014-04-06 DIAGNOSIS — M5441 Lumbago with sciatica, right side: Secondary | ICD-10-CM | POA: Diagnosis not present

## 2014-04-08 DIAGNOSIS — M9903 Segmental and somatic dysfunction of lumbar region: Secondary | ICD-10-CM | POA: Diagnosis not present

## 2014-04-08 DIAGNOSIS — M9904 Segmental and somatic dysfunction of sacral region: Secondary | ICD-10-CM | POA: Diagnosis not present

## 2014-04-08 DIAGNOSIS — M5441 Lumbago with sciatica, right side: Secondary | ICD-10-CM | POA: Diagnosis not present

## 2014-04-08 DIAGNOSIS — M5136 Other intervertebral disc degeneration, lumbar region: Secondary | ICD-10-CM | POA: Diagnosis not present

## 2014-04-12 DIAGNOSIS — Z23 Encounter for immunization: Secondary | ICD-10-CM | POA: Diagnosis not present

## 2014-04-12 DIAGNOSIS — M9903 Segmental and somatic dysfunction of lumbar region: Secondary | ICD-10-CM | POA: Diagnosis not present

## 2014-04-12 DIAGNOSIS — M9904 Segmental and somatic dysfunction of sacral region: Secondary | ICD-10-CM | POA: Diagnosis not present

## 2014-04-12 DIAGNOSIS — M5441 Lumbago with sciatica, right side: Secondary | ICD-10-CM | POA: Diagnosis not present

## 2014-04-12 DIAGNOSIS — M5136 Other intervertebral disc degeneration, lumbar region: Secondary | ICD-10-CM | POA: Diagnosis not present

## 2014-04-13 DIAGNOSIS — M9903 Segmental and somatic dysfunction of lumbar region: Secondary | ICD-10-CM | POA: Diagnosis not present

## 2014-04-13 DIAGNOSIS — M9904 Segmental and somatic dysfunction of sacral region: Secondary | ICD-10-CM | POA: Diagnosis not present

## 2014-04-13 DIAGNOSIS — M5136 Other intervertebral disc degeneration, lumbar region: Secondary | ICD-10-CM | POA: Diagnosis not present

## 2014-04-13 DIAGNOSIS — M5441 Lumbago with sciatica, right side: Secondary | ICD-10-CM | POA: Diagnosis not present

## 2014-04-14 DIAGNOSIS — L570 Actinic keratosis: Secondary | ICD-10-CM | POA: Diagnosis not present

## 2014-04-15 DIAGNOSIS — M5136 Other intervertebral disc degeneration, lumbar region: Secondary | ICD-10-CM | POA: Diagnosis not present

## 2014-04-15 DIAGNOSIS — M9904 Segmental and somatic dysfunction of sacral region: Secondary | ICD-10-CM | POA: Diagnosis not present

## 2014-04-15 DIAGNOSIS — M5441 Lumbago with sciatica, right side: Secondary | ICD-10-CM | POA: Diagnosis not present

## 2014-04-15 DIAGNOSIS — M9903 Segmental and somatic dysfunction of lumbar region: Secondary | ICD-10-CM | POA: Diagnosis not present

## 2014-04-19 DIAGNOSIS — M5136 Other intervertebral disc degeneration, lumbar region: Secondary | ICD-10-CM | POA: Diagnosis not present

## 2014-04-19 DIAGNOSIS — M9903 Segmental and somatic dysfunction of lumbar region: Secondary | ICD-10-CM | POA: Diagnosis not present

## 2014-04-19 DIAGNOSIS — M9904 Segmental and somatic dysfunction of sacral region: Secondary | ICD-10-CM | POA: Diagnosis not present

## 2014-04-19 DIAGNOSIS — M5441 Lumbago with sciatica, right side: Secondary | ICD-10-CM | POA: Diagnosis not present

## 2014-04-20 DIAGNOSIS — M9904 Segmental and somatic dysfunction of sacral region: Secondary | ICD-10-CM | POA: Diagnosis not present

## 2014-04-20 DIAGNOSIS — M5441 Lumbago with sciatica, right side: Secondary | ICD-10-CM | POA: Diagnosis not present

## 2014-04-20 DIAGNOSIS — M9903 Segmental and somatic dysfunction of lumbar region: Secondary | ICD-10-CM | POA: Diagnosis not present

## 2014-04-20 DIAGNOSIS — M5136 Other intervertebral disc degeneration, lumbar region: Secondary | ICD-10-CM | POA: Diagnosis not present

## 2014-04-22 DIAGNOSIS — M5441 Lumbago with sciatica, right side: Secondary | ICD-10-CM | POA: Diagnosis not present

## 2014-04-22 DIAGNOSIS — M9903 Segmental and somatic dysfunction of lumbar region: Secondary | ICD-10-CM | POA: Diagnosis not present

## 2014-04-22 DIAGNOSIS — M5136 Other intervertebral disc degeneration, lumbar region: Secondary | ICD-10-CM | POA: Diagnosis not present

## 2014-04-22 DIAGNOSIS — M9904 Segmental and somatic dysfunction of sacral region: Secondary | ICD-10-CM | POA: Diagnosis not present

## 2014-04-26 DIAGNOSIS — M9904 Segmental and somatic dysfunction of sacral region: Secondary | ICD-10-CM | POA: Diagnosis not present

## 2014-04-26 DIAGNOSIS — M9903 Segmental and somatic dysfunction of lumbar region: Secondary | ICD-10-CM | POA: Diagnosis not present

## 2014-04-26 DIAGNOSIS — M5136 Other intervertebral disc degeneration, lumbar region: Secondary | ICD-10-CM | POA: Diagnosis not present

## 2014-04-26 DIAGNOSIS — M5441 Lumbago with sciatica, right side: Secondary | ICD-10-CM | POA: Diagnosis not present

## 2014-04-27 DIAGNOSIS — M5136 Other intervertebral disc degeneration, lumbar region: Secondary | ICD-10-CM | POA: Diagnosis not present

## 2014-04-27 DIAGNOSIS — M9903 Segmental and somatic dysfunction of lumbar region: Secondary | ICD-10-CM | POA: Diagnosis not present

## 2014-04-27 DIAGNOSIS — M9904 Segmental and somatic dysfunction of sacral region: Secondary | ICD-10-CM | POA: Diagnosis not present

## 2014-04-27 DIAGNOSIS — M5441 Lumbago with sciatica, right side: Secondary | ICD-10-CM | POA: Diagnosis not present

## 2014-04-29 DIAGNOSIS — M9903 Segmental and somatic dysfunction of lumbar region: Secondary | ICD-10-CM | POA: Diagnosis not present

## 2014-04-29 DIAGNOSIS — M5136 Other intervertebral disc degeneration, lumbar region: Secondary | ICD-10-CM | POA: Diagnosis not present

## 2014-04-29 DIAGNOSIS — M5441 Lumbago with sciatica, right side: Secondary | ICD-10-CM | POA: Diagnosis not present

## 2014-04-29 DIAGNOSIS — M9904 Segmental and somatic dysfunction of sacral region: Secondary | ICD-10-CM | POA: Diagnosis not present

## 2014-05-03 DIAGNOSIS — M9903 Segmental and somatic dysfunction of lumbar region: Secondary | ICD-10-CM | POA: Diagnosis not present

## 2014-05-03 DIAGNOSIS — M9904 Segmental and somatic dysfunction of sacral region: Secondary | ICD-10-CM | POA: Diagnosis not present

## 2014-05-03 DIAGNOSIS — M5136 Other intervertebral disc degeneration, lumbar region: Secondary | ICD-10-CM | POA: Diagnosis not present

## 2014-05-03 DIAGNOSIS — M5441 Lumbago with sciatica, right side: Secondary | ICD-10-CM | POA: Diagnosis not present

## 2014-05-18 DIAGNOSIS — Z1329 Encounter for screening for other suspected endocrine disorder: Secondary | ICD-10-CM | POA: Diagnosis not present

## 2014-05-18 DIAGNOSIS — E782 Mixed hyperlipidemia: Secondary | ICD-10-CM | POA: Diagnosis not present

## 2014-05-18 DIAGNOSIS — I10 Essential (primary) hypertension: Secondary | ICD-10-CM | POA: Diagnosis not present

## 2014-05-18 DIAGNOSIS — R7301 Impaired fasting glucose: Secondary | ICD-10-CM | POA: Diagnosis not present

## 2014-05-31 DIAGNOSIS — M9904 Segmental and somatic dysfunction of sacral region: Secondary | ICD-10-CM | POA: Diagnosis not present

## 2014-05-31 DIAGNOSIS — M5136 Other intervertebral disc degeneration, lumbar region: Secondary | ICD-10-CM | POA: Diagnosis not present

## 2014-05-31 DIAGNOSIS — M9903 Segmental and somatic dysfunction of lumbar region: Secondary | ICD-10-CM | POA: Diagnosis not present

## 2014-05-31 DIAGNOSIS — M5441 Lumbago with sciatica, right side: Secondary | ICD-10-CM | POA: Diagnosis not present

## 2014-06-25 DIAGNOSIS — J309 Allergic rhinitis, unspecified: Secondary | ICD-10-CM | POA: Diagnosis not present

## 2014-06-25 DIAGNOSIS — J4530 Mild persistent asthma, uncomplicated: Secondary | ICD-10-CM | POA: Diagnosis not present

## 2014-06-25 DIAGNOSIS — G4733 Obstructive sleep apnea (adult) (pediatric): Secondary | ICD-10-CM | POA: Diagnosis not present

## 2014-06-25 DIAGNOSIS — G2581 Restless legs syndrome: Secondary | ICD-10-CM | POA: Diagnosis not present

## 2014-06-28 DIAGNOSIS — M5441 Lumbago with sciatica, right side: Secondary | ICD-10-CM | POA: Diagnosis not present

## 2014-06-28 DIAGNOSIS — M9904 Segmental and somatic dysfunction of sacral region: Secondary | ICD-10-CM | POA: Diagnosis not present

## 2014-06-28 DIAGNOSIS — M5136 Other intervertebral disc degeneration, lumbar region: Secondary | ICD-10-CM | POA: Diagnosis not present

## 2014-06-28 DIAGNOSIS — M5137 Other intervertebral disc degeneration, lumbosacral region: Secondary | ICD-10-CM | POA: Diagnosis not present

## 2014-06-28 DIAGNOSIS — M9903 Segmental and somatic dysfunction of lumbar region: Secondary | ICD-10-CM | POA: Diagnosis not present

## 2014-06-29 DIAGNOSIS — E782 Mixed hyperlipidemia: Secondary | ICD-10-CM | POA: Diagnosis not present

## 2014-06-29 DIAGNOSIS — Z6827 Body mass index (BMI) 27.0-27.9, adult: Secondary | ICD-10-CM | POA: Diagnosis not present

## 2014-06-29 DIAGNOSIS — Z23 Encounter for immunization: Secondary | ICD-10-CM | POA: Diagnosis not present

## 2014-09-13 DIAGNOSIS — K219 Gastro-esophageal reflux disease without esophagitis: Secondary | ICD-10-CM | POA: Diagnosis not present

## 2014-09-13 DIAGNOSIS — Z6826 Body mass index (BMI) 26.0-26.9, adult: Secondary | ICD-10-CM | POA: Diagnosis not present

## 2014-10-05 DIAGNOSIS — K5900 Constipation, unspecified: Secondary | ICD-10-CM | POA: Diagnosis not present

## 2014-10-05 DIAGNOSIS — K219 Gastro-esophageal reflux disease without esophagitis: Secondary | ICD-10-CM | POA: Diagnosis not present

## 2014-10-27 DIAGNOSIS — K2950 Unspecified chronic gastritis without bleeding: Secondary | ICD-10-CM | POA: Diagnosis not present

## 2014-10-27 DIAGNOSIS — K2900 Acute gastritis without bleeding: Secondary | ICD-10-CM | POA: Diagnosis not present

## 2014-10-27 DIAGNOSIS — E039 Hypothyroidism, unspecified: Secondary | ICD-10-CM | POA: Diagnosis not present

## 2014-10-27 DIAGNOSIS — J329 Chronic sinusitis, unspecified: Secondary | ICD-10-CM | POA: Diagnosis not present

## 2014-10-27 DIAGNOSIS — J45909 Unspecified asthma, uncomplicated: Secondary | ICD-10-CM | POA: Diagnosis not present

## 2014-10-27 DIAGNOSIS — K317 Polyp of stomach and duodenum: Secondary | ICD-10-CM | POA: Diagnosis not present

## 2014-10-27 DIAGNOSIS — K5900 Constipation, unspecified: Secondary | ICD-10-CM | POA: Diagnosis not present

## 2014-10-27 DIAGNOSIS — K219 Gastro-esophageal reflux disease without esophagitis: Secondary | ICD-10-CM | POA: Diagnosis not present

## 2014-10-27 DIAGNOSIS — E780 Pure hypercholesterolemia: Secondary | ICD-10-CM | POA: Diagnosis not present

## 2014-10-27 DIAGNOSIS — K5730 Diverticulosis of large intestine without perforation or abscess without bleeding: Secondary | ICD-10-CM | POA: Diagnosis not present

## 2014-10-27 DIAGNOSIS — M1990 Unspecified osteoarthritis, unspecified site: Secondary | ICD-10-CM | POA: Diagnosis not present

## 2014-10-27 DIAGNOSIS — D131 Benign neoplasm of stomach: Secondary | ICD-10-CM | POA: Diagnosis not present

## 2014-10-27 DIAGNOSIS — I10 Essential (primary) hypertension: Secondary | ICD-10-CM | POA: Diagnosis not present

## 2014-11-17 DIAGNOSIS — Z6826 Body mass index (BMI) 26.0-26.9, adult: Secondary | ICD-10-CM | POA: Diagnosis not present

## 2014-11-17 DIAGNOSIS — K219 Gastro-esophageal reflux disease without esophagitis: Secondary | ICD-10-CM | POA: Diagnosis not present

## 2014-11-17 DIAGNOSIS — E039 Hypothyroidism, unspecified: Secondary | ICD-10-CM | POA: Diagnosis not present

## 2014-11-17 DIAGNOSIS — E782 Mixed hyperlipidemia: Secondary | ICD-10-CM | POA: Diagnosis not present

## 2014-11-30 DIAGNOSIS — M5441 Lumbago with sciatica, right side: Secondary | ICD-10-CM | POA: Diagnosis not present

## 2014-11-30 DIAGNOSIS — M9904 Segmental and somatic dysfunction of sacral region: Secondary | ICD-10-CM | POA: Diagnosis not present

## 2014-11-30 DIAGNOSIS — M5136 Other intervertebral disc degeneration, lumbar region: Secondary | ICD-10-CM | POA: Diagnosis not present

## 2014-11-30 DIAGNOSIS — M5137 Other intervertebral disc degeneration, lumbosacral region: Secondary | ICD-10-CM | POA: Diagnosis not present

## 2014-11-30 DIAGNOSIS — M9903 Segmental and somatic dysfunction of lumbar region: Secondary | ICD-10-CM | POA: Diagnosis not present

## 2014-12-01 DIAGNOSIS — M9904 Segmental and somatic dysfunction of sacral region: Secondary | ICD-10-CM | POA: Diagnosis not present

## 2014-12-01 DIAGNOSIS — M9903 Segmental and somatic dysfunction of lumbar region: Secondary | ICD-10-CM | POA: Diagnosis not present

## 2014-12-01 DIAGNOSIS — M5137 Other intervertebral disc degeneration, lumbosacral region: Secondary | ICD-10-CM | POA: Diagnosis not present

## 2014-12-01 DIAGNOSIS — M5441 Lumbago with sciatica, right side: Secondary | ICD-10-CM | POA: Diagnosis not present

## 2014-12-01 DIAGNOSIS — M5136 Other intervertebral disc degeneration, lumbar region: Secondary | ICD-10-CM | POA: Diagnosis not present

## 2014-12-06 DIAGNOSIS — M9904 Segmental and somatic dysfunction of sacral region: Secondary | ICD-10-CM | POA: Diagnosis not present

## 2014-12-06 DIAGNOSIS — M5137 Other intervertebral disc degeneration, lumbosacral region: Secondary | ICD-10-CM | POA: Diagnosis not present

## 2014-12-06 DIAGNOSIS — M9903 Segmental and somatic dysfunction of lumbar region: Secondary | ICD-10-CM | POA: Diagnosis not present

## 2014-12-06 DIAGNOSIS — M5441 Lumbago with sciatica, right side: Secondary | ICD-10-CM | POA: Diagnosis not present

## 2014-12-06 DIAGNOSIS — M5136 Other intervertebral disc degeneration, lumbar region: Secondary | ICD-10-CM | POA: Diagnosis not present

## 2014-12-09 DIAGNOSIS — M5137 Other intervertebral disc degeneration, lumbosacral region: Secondary | ICD-10-CM | POA: Diagnosis not present

## 2014-12-09 DIAGNOSIS — M9903 Segmental and somatic dysfunction of lumbar region: Secondary | ICD-10-CM | POA: Diagnosis not present

## 2014-12-09 DIAGNOSIS — M5441 Lumbago with sciatica, right side: Secondary | ICD-10-CM | POA: Diagnosis not present

## 2014-12-09 DIAGNOSIS — M5136 Other intervertebral disc degeneration, lumbar region: Secondary | ICD-10-CM | POA: Diagnosis not present

## 2014-12-09 DIAGNOSIS — M9904 Segmental and somatic dysfunction of sacral region: Secondary | ICD-10-CM | POA: Diagnosis not present

## 2014-12-15 DIAGNOSIS — M5137 Other intervertebral disc degeneration, lumbosacral region: Secondary | ICD-10-CM | POA: Diagnosis not present

## 2014-12-15 DIAGNOSIS — M5441 Lumbago with sciatica, right side: Secondary | ICD-10-CM | POA: Diagnosis not present

## 2014-12-15 DIAGNOSIS — M5136 Other intervertebral disc degeneration, lumbar region: Secondary | ICD-10-CM | POA: Diagnosis not present

## 2014-12-15 DIAGNOSIS — M9903 Segmental and somatic dysfunction of lumbar region: Secondary | ICD-10-CM | POA: Diagnosis not present

## 2014-12-15 DIAGNOSIS — M9904 Segmental and somatic dysfunction of sacral region: Secondary | ICD-10-CM | POA: Diagnosis not present

## 2014-12-22 DIAGNOSIS — M9904 Segmental and somatic dysfunction of sacral region: Secondary | ICD-10-CM | POA: Diagnosis not present

## 2014-12-22 DIAGNOSIS — M5441 Lumbago with sciatica, right side: Secondary | ICD-10-CM | POA: Diagnosis not present

## 2014-12-22 DIAGNOSIS — M5136 Other intervertebral disc degeneration, lumbar region: Secondary | ICD-10-CM | POA: Diagnosis not present

## 2014-12-22 DIAGNOSIS — M5137 Other intervertebral disc degeneration, lumbosacral region: Secondary | ICD-10-CM | POA: Diagnosis not present

## 2014-12-22 DIAGNOSIS — M9903 Segmental and somatic dysfunction of lumbar region: Secondary | ICD-10-CM | POA: Diagnosis not present

## 2014-12-24 DIAGNOSIS — G4733 Obstructive sleep apnea (adult) (pediatric): Secondary | ICD-10-CM | POA: Diagnosis not present

## 2014-12-24 DIAGNOSIS — K219 Gastro-esophageal reflux disease without esophagitis: Secondary | ICD-10-CM | POA: Diagnosis not present

## 2014-12-24 DIAGNOSIS — J3089 Other allergic rhinitis: Secondary | ICD-10-CM | POA: Diagnosis not present

## 2014-12-24 DIAGNOSIS — J4520 Mild intermittent asthma, uncomplicated: Secondary | ICD-10-CM | POA: Diagnosis not present

## 2015-01-21 DIAGNOSIS — G4733 Obstructive sleep apnea (adult) (pediatric): Secondary | ICD-10-CM | POA: Diagnosis not present

## 2015-01-21 DIAGNOSIS — G2581 Restless legs syndrome: Secondary | ICD-10-CM | POA: Diagnosis not present

## 2015-01-21 DIAGNOSIS — K219 Gastro-esophageal reflux disease without esophagitis: Secondary | ICD-10-CM | POA: Diagnosis not present

## 2015-01-21 DIAGNOSIS — J4520 Mild intermittent asthma, uncomplicated: Secondary | ICD-10-CM | POA: Diagnosis not present

## 2015-01-26 DIAGNOSIS — M5137 Other intervertebral disc degeneration, lumbosacral region: Secondary | ICD-10-CM | POA: Diagnosis not present

## 2015-01-26 DIAGNOSIS — M5136 Other intervertebral disc degeneration, lumbar region: Secondary | ICD-10-CM | POA: Diagnosis not present

## 2015-01-26 DIAGNOSIS — M5441 Lumbago with sciatica, right side: Secondary | ICD-10-CM | POA: Diagnosis not present

## 2015-01-26 DIAGNOSIS — M9903 Segmental and somatic dysfunction of lumbar region: Secondary | ICD-10-CM | POA: Diagnosis not present

## 2015-01-26 DIAGNOSIS — M9904 Segmental and somatic dysfunction of sacral region: Secondary | ICD-10-CM | POA: Diagnosis not present

## 2015-04-06 DIAGNOSIS — Z139 Encounter for screening, unspecified: Secondary | ICD-10-CM | POA: Diagnosis not present

## 2015-04-06 DIAGNOSIS — Z23 Encounter for immunization: Secondary | ICD-10-CM | POA: Diagnosis not present

## 2015-04-06 DIAGNOSIS — Z0000 Encounter for general adult medical examination without abnormal findings: Secondary | ICD-10-CM | POA: Diagnosis not present

## 2015-04-06 DIAGNOSIS — Z1389 Encounter for screening for other disorder: Secondary | ICD-10-CM | POA: Diagnosis not present

## 2015-04-18 DIAGNOSIS — L570 Actinic keratosis: Secondary | ICD-10-CM | POA: Diagnosis not present

## 2015-04-27 DIAGNOSIS — H04123 Dry eye syndrome of bilateral lacrimal glands: Secondary | ICD-10-CM | POA: Diagnosis not present

## 2015-04-27 DIAGNOSIS — H43819 Vitreous degeneration, unspecified eye: Secondary | ICD-10-CM | POA: Diagnosis not present

## 2015-04-29 DIAGNOSIS — J4520 Mild intermittent asthma, uncomplicated: Secondary | ICD-10-CM | POA: Diagnosis not present

## 2015-04-29 DIAGNOSIS — G2581 Restless legs syndrome: Secondary | ICD-10-CM | POA: Diagnosis not present

## 2015-04-29 DIAGNOSIS — K219 Gastro-esophageal reflux disease without esophagitis: Secondary | ICD-10-CM | POA: Diagnosis not present

## 2015-04-29 DIAGNOSIS — G4733 Obstructive sleep apnea (adult) (pediatric): Secondary | ICD-10-CM | POA: Diagnosis not present

## 2015-05-02 DIAGNOSIS — G4733 Obstructive sleep apnea (adult) (pediatric): Secondary | ICD-10-CM | POA: Diagnosis not present

## 2015-05-10 DIAGNOSIS — J4520 Mild intermittent asthma, uncomplicated: Secondary | ICD-10-CM | POA: Diagnosis not present

## 2015-05-10 DIAGNOSIS — G4733 Obstructive sleep apnea (adult) (pediatric): Secondary | ICD-10-CM | POA: Diagnosis not present

## 2015-05-10 DIAGNOSIS — K219 Gastro-esophageal reflux disease without esophagitis: Secondary | ICD-10-CM | POA: Diagnosis not present

## 2015-05-10 DIAGNOSIS — G2581 Restless legs syndrome: Secondary | ICD-10-CM | POA: Diagnosis not present

## 2015-05-12 DIAGNOSIS — E785 Hyperlipidemia, unspecified: Secondary | ICD-10-CM | POA: Diagnosis not present

## 2015-05-12 DIAGNOSIS — I10 Essential (primary) hypertension: Secondary | ICD-10-CM | POA: Diagnosis not present

## 2015-05-19 DIAGNOSIS — K219 Gastro-esophageal reflux disease without esophagitis: Secondary | ICD-10-CM | POA: Diagnosis not present

## 2015-05-19 DIAGNOSIS — M791 Myalgia: Secondary | ICD-10-CM | POA: Diagnosis not present

## 2015-05-19 DIAGNOSIS — E039 Hypothyroidism, unspecified: Secondary | ICD-10-CM | POA: Diagnosis not present

## 2015-05-19 DIAGNOSIS — E785 Hyperlipidemia, unspecified: Secondary | ICD-10-CM | POA: Diagnosis not present

## 2015-05-30 DIAGNOSIS — L219 Seborrheic dermatitis, unspecified: Secondary | ICD-10-CM | POA: Diagnosis not present

## 2015-05-30 DIAGNOSIS — L570 Actinic keratosis: Secondary | ICD-10-CM | POA: Diagnosis not present

## 2015-06-01 DIAGNOSIS — Z6826 Body mass index (BMI) 26.0-26.9, adult: Secondary | ICD-10-CM | POA: Diagnosis not present

## 2015-06-01 DIAGNOSIS — M791 Myalgia: Secondary | ICD-10-CM | POA: Diagnosis not present

## 2015-06-01 DIAGNOSIS — M545 Low back pain: Secondary | ICD-10-CM | POA: Diagnosis not present

## 2015-06-08 DIAGNOSIS — G4733 Obstructive sleep apnea (adult) (pediatric): Secondary | ICD-10-CM | POA: Diagnosis not present

## 2015-06-08 DIAGNOSIS — K219 Gastro-esophageal reflux disease without esophagitis: Secondary | ICD-10-CM | POA: Diagnosis not present

## 2015-06-08 DIAGNOSIS — J4520 Mild intermittent asthma, uncomplicated: Secondary | ICD-10-CM | POA: Diagnosis not present

## 2015-06-08 DIAGNOSIS — G2581 Restless legs syndrome: Secondary | ICD-10-CM | POA: Diagnosis not present

## 2015-06-28 DIAGNOSIS — S61211A Laceration without foreign body of left index finger without damage to nail, initial encounter: Secondary | ICD-10-CM | POA: Diagnosis not present

## 2015-08-05 DIAGNOSIS — Z6827 Body mass index (BMI) 27.0-27.9, adult: Secondary | ICD-10-CM | POA: Diagnosis not present

## 2015-08-05 DIAGNOSIS — R079 Chest pain, unspecified: Secondary | ICD-10-CM | POA: Diagnosis not present

## 2015-08-05 DIAGNOSIS — J984 Other disorders of lung: Secondary | ICD-10-CM | POA: Diagnosis not present

## 2015-08-09 DIAGNOSIS — R079 Chest pain, unspecified: Secondary | ICD-10-CM | POA: Diagnosis not present

## 2015-08-12 DIAGNOSIS — I2510 Atherosclerotic heart disease of native coronary artery without angina pectoris: Secondary | ICD-10-CM | POA: Diagnosis not present

## 2015-08-12 DIAGNOSIS — R911 Solitary pulmonary nodule: Secondary | ICD-10-CM | POA: Diagnosis not present

## 2015-08-12 DIAGNOSIS — R918 Other nonspecific abnormal finding of lung field: Secondary | ICD-10-CM | POA: Diagnosis not present

## 2015-08-12 DIAGNOSIS — R079 Chest pain, unspecified: Secondary | ICD-10-CM | POA: Diagnosis not present

## 2015-08-18 DIAGNOSIS — R079 Chest pain, unspecified: Secondary | ICD-10-CM | POA: Diagnosis not present

## 2015-08-18 DIAGNOSIS — Z6826 Body mass index (BMI) 26.0-26.9, adult: Secondary | ICD-10-CM | POA: Diagnosis not present

## 2015-08-24 DIAGNOSIS — R079 Chest pain, unspecified: Secondary | ICD-10-CM | POA: Diagnosis not present

## 2015-08-24 DIAGNOSIS — I259 Chronic ischemic heart disease, unspecified: Secondary | ICD-10-CM | POA: Diagnosis not present

## 2015-08-29 DIAGNOSIS — Z6826 Body mass index (BMI) 26.0-26.9, adult: Secondary | ICD-10-CM | POA: Diagnosis not present

## 2015-08-29 DIAGNOSIS — R9439 Abnormal result of other cardiovascular function study: Secondary | ICD-10-CM | POA: Diagnosis not present

## 2015-08-29 DIAGNOSIS — R079 Chest pain, unspecified: Secondary | ICD-10-CM | POA: Diagnosis not present

## 2015-09-06 DIAGNOSIS — E785 Hyperlipidemia, unspecified: Secondary | ICD-10-CM | POA: Insufficient documentation

## 2015-09-06 DIAGNOSIS — I209 Angina pectoris, unspecified: Secondary | ICD-10-CM | POA: Diagnosis not present

## 2015-09-06 DIAGNOSIS — R0609 Other forms of dyspnea: Secondary | ICD-10-CM

## 2015-09-06 DIAGNOSIS — R9439 Abnormal result of other cardiovascular function study: Secondary | ICD-10-CM | POA: Diagnosis not present

## 2015-09-06 DIAGNOSIS — R0600 Dyspnea, unspecified: Secondary | ICD-10-CM | POA: Insufficient documentation

## 2015-09-06 DIAGNOSIS — I25118 Atherosclerotic heart disease of native coronary artery with other forms of angina pectoris: Secondary | ICD-10-CM | POA: Diagnosis not present

## 2015-09-06 DIAGNOSIS — I2510 Atherosclerotic heart disease of native coronary artery without angina pectoris: Secondary | ICD-10-CM | POA: Insufficient documentation

## 2015-09-06 HISTORY — DX: Other forms of dyspnea: R06.09

## 2015-09-06 HISTORY — DX: Hyperlipidemia, unspecified: E78.5

## 2015-09-06 HISTORY — DX: Dyspnea, unspecified: R06.00

## 2015-09-06 HISTORY — DX: Angina pectoris, unspecified: I20.9

## 2015-09-07 DIAGNOSIS — I209 Angina pectoris, unspecified: Secondary | ICD-10-CM | POA: Diagnosis not present

## 2015-09-07 DIAGNOSIS — E785 Hyperlipidemia, unspecified: Secondary | ICD-10-CM | POA: Diagnosis not present

## 2015-09-07 DIAGNOSIS — I25118 Atherosclerotic heart disease of native coronary artery with other forms of angina pectoris: Secondary | ICD-10-CM | POA: Diagnosis not present

## 2015-09-07 DIAGNOSIS — R0609 Other forms of dyspnea: Secondary | ICD-10-CM | POA: Diagnosis not present

## 2015-09-07 DIAGNOSIS — Z01818 Encounter for other preprocedural examination: Secondary | ICD-10-CM | POA: Diagnosis not present

## 2015-09-07 DIAGNOSIS — R9439 Abnormal result of other cardiovascular function study: Secondary | ICD-10-CM | POA: Diagnosis not present

## 2015-09-07 DIAGNOSIS — R001 Bradycardia, unspecified: Secondary | ICD-10-CM | POA: Diagnosis not present

## 2015-09-07 DIAGNOSIS — Z79899 Other long term (current) drug therapy: Secondary | ICD-10-CM | POA: Diagnosis not present

## 2015-09-08 DIAGNOSIS — I25118 Atherosclerotic heart disease of native coronary artery with other forms of angina pectoris: Secondary | ICD-10-CM | POA: Diagnosis not present

## 2015-09-08 DIAGNOSIS — R9439 Abnormal result of other cardiovascular function study: Secondary | ICD-10-CM | POA: Diagnosis not present

## 2015-09-10 HISTORY — PX: OTHER SURGICAL HISTORY: SHX169

## 2015-09-22 DIAGNOSIS — Z6827 Body mass index (BMI) 27.0-27.9, adult: Secondary | ICD-10-CM | POA: Diagnosis not present

## 2015-09-22 DIAGNOSIS — R079 Chest pain, unspecified: Secondary | ICD-10-CM | POA: Diagnosis not present

## 2015-10-06 DIAGNOSIS — I25118 Atherosclerotic heart disease of native coronary artery with other forms of angina pectoris: Secondary | ICD-10-CM | POA: Diagnosis not present

## 2015-10-06 DIAGNOSIS — E785 Hyperlipidemia, unspecified: Secondary | ICD-10-CM | POA: Diagnosis not present

## 2015-10-06 DIAGNOSIS — I209 Angina pectoris, unspecified: Secondary | ICD-10-CM | POA: Diagnosis not present

## 2015-10-06 DIAGNOSIS — R0609 Other forms of dyspnea: Secondary | ICD-10-CM | POA: Diagnosis not present

## 2015-10-07 DIAGNOSIS — E785 Hyperlipidemia, unspecified: Secondary | ICD-10-CM | POA: Diagnosis not present

## 2015-11-08 ENCOUNTER — Telehealth: Payer: Self-pay | Admitting: Pulmonary Disease

## 2015-11-08 NOTE — Telephone Encounter (Signed)
Rec'd from [facility] forward 13 pages to [place]

## 2015-11-14 ENCOUNTER — Ambulatory Visit (INDEPENDENT_AMBULATORY_CARE_PROVIDER_SITE_OTHER): Payer: PPO | Admitting: Pulmonary Disease

## 2015-11-14 ENCOUNTER — Encounter: Payer: Self-pay | Admitting: *Deleted

## 2015-11-14 ENCOUNTER — Telehealth: Payer: Self-pay | Admitting: Pulmonary Disease

## 2015-11-14 VITALS — BP 132/88 | HR 56 | Ht 70.0 in | Wt 188.0 lb

## 2015-11-14 DIAGNOSIS — G4733 Obstructive sleep apnea (adult) (pediatric): Secondary | ICD-10-CM

## 2015-11-14 DIAGNOSIS — Z9989 Dependence on other enabling machines and devices: Secondary | ICD-10-CM

## 2015-11-14 HISTORY — DX: Dependence on other enabling machines and devices: Z99.89

## 2015-11-14 HISTORY — DX: Obstructive sleep apnea (adult) (pediatric): G47.33

## 2015-11-14 NOTE — Telephone Encounter (Signed)
Spoke with pt's wife. Pt is needing a refill on Clonazepam. He meant to ask about this when he was here for his appointment. A 90 day supply will need to go to [facility].  RA - please advise if you are okay with refilling this medication. Thanks.

## 2015-11-14 NOTE — Assessment & Plan Note (Addendum)
CPAP is set at 13 cm and seems to be effective Call me if snoring persists or if your wife witnesses apneas  We will obtain your sleep studies from Dr.Chodri We will set you up with a new DME to get CPAP supplies    Weight loss encouraged, compliance with goal of at least 4-6 hrs every night is the expectation. Advised against medications with sedative side effects Cautioned against driving when sleepy - understanding that sleepiness will vary on a day to day basis

## 2015-11-14 NOTE — Patient Instructions (Signed)
Your CPAP is set at 13 cm and seems to be effective Call me if snoring persists or if your wife witnesses apneas  We will obtain your sleep studies from Dr.Chodri We will set you up with a new DME to get CPAP supplies

## 2015-11-14 NOTE — Telephone Encounter (Signed)
Defer to PCP I prefer an alternative medication for restless leg syndrome like requip -so would prefer if PCP prescribes this

## 2015-11-14 NOTE — Progress Notes (Signed)
Subjective:    Patient ID: Shane Massey, male    DOB: [birth year], 75 y.o.   MRN: [id number]  HPI  Chief Complaint  Patient presents with  . Sleep Consult    Referred by Shane. Alcide Massey.    75 year old referred for management of OSA He was diagnosed with OSA more than 15 years ago by Shane Massey in [place] and is maintained on CPAP since then. He underwent a repeat study in 2016 He changed insurance this year his DME was [facility] and he now needs to change DME and her sleep doctor.  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 His wife Shane Massey reports that he still snores on CPAP and has occasional witnessed apneas. He is maintained on CPAP of 13 cm and download shows excellent compliance with a few residuals at 5/hour and mild leak.  Epworth sleepiness score is 8 Bedtime is between 10 PM and midnight, sleep latency is about 30 minutes, he sleeps on his back with one pillow up with 1-2 nocturnal awakenings for nocturia and is out of bed by 9 AM feeling rested without dryness of mouth or headaches, his weight has mostly been unchanged over the past few years  He has been diagnosed with restless leg syndrome and has been maintained on clonazepam for more than 10 years. He did not try Requip due to reading about symptoms of augmentation.  He was diagnosed with asthma-but his symptoms seem to have been mostly cardiac related with dyspnea on walking uphill and this was relieved after stent placement, he has not required use of MDI     Past Medical History  Diagnosis Date  . OSA (obstructive sleep apnea)   . GERD (gastroesophageal reflux disease)   . Hypothyroidism     No past surgical history on file.  No Known Allergies  Social History   Social History  . Marital Status: Married    Spouse Name: N/A  . Number of Children: N/A  . Years of Education: N/A   Occupational History  . Not on file.   Social History Main Topics  . Smoking status: Former Smoker --  0.50 packs/day for 4 years    Types: Cigarettes    Quit date: 06/11/1958  . Smokeless tobacco: Not on file  . Alcohol Use: No  . Drug Use: No  . Sexual Activity: Not on file   Other Topics Concern  . Not on file   Social History Narrative  . No narrative on file    No family history on file.     Review of Systems  Constitutional: Negative for fever and unexpected weight change.  HENT: Negative for congestion, dental problem, ear pain, nosebleeds, postnasal drip, rhinorrhea, sinus pressure, sneezing, sore throat and trouble swallowing.   Eyes: Negative for redness and itching.  Respiratory: Negative for cough, chest tightness, shortness of breath and wheezing.   Cardiovascular: Negative for palpitations and leg swelling.  Gastrointestinal: Negative for nausea and vomiting.  Genitourinary: Negative for dysuria.  Musculoskeletal: Negative for joint swelling.  Skin: Negative for rash.  Neurological: Negative for headaches.  Hematological: Does not bruise/bleed easily.  Psychiatric/Behavioral: Negative for dysphoric mood. The patient is not nervous/anxious.        Objective:   Physical Exam  Gen. Pleasant, well-nourished, in no distress ENT - no lesions, no post nasal drip Neck: No JVD, no thyromegaly, no carotid bruits Lungs: no use of accessory muscles, no dullness to percussion, clear without rales or  rhonchi  Cardiovascular: Rhythm regular, heart sounds  normal, no murmurs or gallops, no peripheral edema Musculoskeletal: No deformities, no cyanosis or clubbing         Assessment & Plan:

## 2015-11-15 NOTE — Telephone Encounter (Signed)
Spoke with pt's wife. She is aware of the below. Nothing further was needed.

## 2015-11-18 ENCOUNTER — Telehealth: Payer: Self-pay | Admitting: Pulmonary Disease

## 2015-11-18 NOTE — Telephone Encounter (Signed)
11/2003 PSG showed mild OSA RDI 10/h 12/2003 wt 180 lbs - CPAP 9 cm

## 2015-11-22 DIAGNOSIS — G4733 Obstructive sleep apnea (adult) (pediatric): Secondary | ICD-10-CM | POA: Diagnosis not present

## 2015-11-23 DIAGNOSIS — H6590 Unspecified nonsuppurative otitis media, unspecified ear: Secondary | ICD-10-CM | POA: Diagnosis not present

## 2015-11-23 DIAGNOSIS — Z6826 Body mass index (BMI) 26.0-26.9, adult: Secondary | ICD-10-CM | POA: Diagnosis not present

## 2015-11-23 DIAGNOSIS — G2581 Restless legs syndrome: Secondary | ICD-10-CM | POA: Diagnosis not present

## 2015-11-24 ENCOUNTER — Encounter: Payer: Self-pay | Admitting: Pulmonary Disease

## 2015-11-28 DIAGNOSIS — L570 Actinic keratosis: Secondary | ICD-10-CM | POA: Diagnosis not present

## 2015-12-03 DIAGNOSIS — K5732 Diverticulitis of large intestine without perforation or abscess without bleeding: Secondary | ICD-10-CM | POA: Diagnosis not present

## 2015-12-03 DIAGNOSIS — N281 Cyst of kidney, acquired: Secondary | ICD-10-CM | POA: Diagnosis not present

## 2015-12-03 DIAGNOSIS — R109 Unspecified abdominal pain: Secondary | ICD-10-CM | POA: Diagnosis not present

## 2015-12-07 DIAGNOSIS — R197 Diarrhea, unspecified: Secondary | ICD-10-CM | POA: Diagnosis not present

## 2015-12-07 DIAGNOSIS — G2581 Restless legs syndrome: Secondary | ICD-10-CM | POA: Diagnosis not present

## 2015-12-07 DIAGNOSIS — Z6826 Body mass index (BMI) 26.0-26.9, adult: Secondary | ICD-10-CM | POA: Diagnosis not present

## 2015-12-07 DIAGNOSIS — H6590 Unspecified nonsuppurative otitis media, unspecified ear: Secondary | ICD-10-CM | POA: Diagnosis not present

## 2015-12-27 DIAGNOSIS — A09 Infectious gastroenteritis and colitis, unspecified: Secondary | ICD-10-CM | POA: Diagnosis not present

## 2015-12-27 DIAGNOSIS — Z9049 Acquired absence of other specified parts of digestive tract: Secondary | ICD-10-CM | POA: Diagnosis not present

## 2015-12-27 DIAGNOSIS — Z8719 Personal history of other diseases of the digestive system: Secondary | ICD-10-CM | POA: Diagnosis not present

## 2015-12-27 DIAGNOSIS — K5730 Diverticulosis of large intestine without perforation or abscess without bleeding: Secondary | ICD-10-CM | POA: Diagnosis not present

## 2016-01-06 DIAGNOSIS — R0609 Other forms of dyspnea: Secondary | ICD-10-CM | POA: Diagnosis not present

## 2016-01-06 DIAGNOSIS — I25118 Atherosclerotic heart disease of native coronary artery with other forms of angina pectoris: Secondary | ICD-10-CM | POA: Diagnosis not present

## 2016-01-06 DIAGNOSIS — I209 Angina pectoris, unspecified: Secondary | ICD-10-CM | POA: Diagnosis not present

## 2016-01-06 DIAGNOSIS — R9439 Abnormal result of other cardiovascular function study: Secondary | ICD-10-CM | POA: Diagnosis not present

## 2016-01-09 DIAGNOSIS — G2581 Restless legs syndrome: Secondary | ICD-10-CM | POA: Diagnosis not present

## 2016-01-09 DIAGNOSIS — Z6826 Body mass index (BMI) 26.0-26.9, adult: Secondary | ICD-10-CM | POA: Diagnosis not present

## 2016-01-11 ENCOUNTER — Encounter: Payer: Self-pay | Admitting: Pulmonary Disease

## 2016-01-11 NOTE — Progress Notes (Signed)
CPAP titration study 04/2015 showed optimal pressure of 13 cm  Spirometry 03/2013 shows normal ratio with FEV1 of 75% and FVC of 73%, DLCO 70%

## 2016-01-19 ENCOUNTER — Encounter: Payer: Self-pay | Admitting: Pulmonary Disease

## 2016-02-07 DIAGNOSIS — Z8719 Personal history of other diseases of the digestive system: Secondary | ICD-10-CM | POA: Diagnosis not present

## 2016-02-07 DIAGNOSIS — K625 Hemorrhage of anus and rectum: Secondary | ICD-10-CM | POA: Diagnosis not present

## 2016-02-07 DIAGNOSIS — K5900 Constipation, unspecified: Secondary | ICD-10-CM | POA: Diagnosis not present

## 2016-02-07 DIAGNOSIS — A047 Enterocolitis due to Clostridium difficile: Secondary | ICD-10-CM | POA: Diagnosis not present

## 2016-02-29 DIAGNOSIS — G4733 Obstructive sleep apnea (adult) (pediatric): Secondary | ICD-10-CM | POA: Diagnosis not present

## 2016-03-09 DIAGNOSIS — I209 Angina pectoris, unspecified: Secondary | ICD-10-CM | POA: Diagnosis not present

## 2016-03-09 DIAGNOSIS — R9439 Abnormal result of other cardiovascular function study: Secondary | ICD-10-CM | POA: Diagnosis not present

## 2016-03-09 DIAGNOSIS — E785 Hyperlipidemia, unspecified: Secondary | ICD-10-CM | POA: Diagnosis not present

## 2016-03-09 DIAGNOSIS — R0609 Other forms of dyspnea: Secondary | ICD-10-CM | POA: Diagnosis not present

## 2016-03-09 DIAGNOSIS — I25118 Atherosclerotic heart disease of native coronary artery with other forms of angina pectoris: Secondary | ICD-10-CM | POA: Diagnosis not present

## 2016-03-13 DIAGNOSIS — I25118 Atherosclerotic heart disease of native coronary artery with other forms of angina pectoris: Secondary | ICD-10-CM | POA: Diagnosis not present

## 2016-03-26 DIAGNOSIS — R9439 Abnormal result of other cardiovascular function study: Secondary | ICD-10-CM | POA: Diagnosis not present

## 2016-03-26 DIAGNOSIS — R0609 Other forms of dyspnea: Secondary | ICD-10-CM | POA: Diagnosis not present

## 2016-03-26 DIAGNOSIS — E785 Hyperlipidemia, unspecified: Secondary | ICD-10-CM | POA: Diagnosis not present

## 2016-03-26 DIAGNOSIS — I25118 Atherosclerotic heart disease of native coronary artery with other forms of angina pectoris: Secondary | ICD-10-CM | POA: Diagnosis not present

## 2016-03-26 DIAGNOSIS — I209 Angina pectoris, unspecified: Secondary | ICD-10-CM | POA: Diagnosis not present

## 2016-04-30 DIAGNOSIS — H524 Presbyopia: Secondary | ICD-10-CM | POA: Diagnosis not present

## 2016-04-30 DIAGNOSIS — H43819 Vitreous degeneration, unspecified eye: Secondary | ICD-10-CM | POA: Diagnosis not present

## 2016-04-30 DIAGNOSIS — H04123 Dry eye syndrome of bilateral lacrimal glands: Secondary | ICD-10-CM | POA: Diagnosis not present

## 2016-05-10 DIAGNOSIS — E785 Hyperlipidemia, unspecified: Secondary | ICD-10-CM | POA: Diagnosis not present

## 2016-05-10 DIAGNOSIS — E039 Hypothyroidism, unspecified: Secondary | ICD-10-CM | POA: Diagnosis not present

## 2016-05-10 DIAGNOSIS — Z9049 Acquired absence of other specified parts of digestive tract: Secondary | ICD-10-CM | POA: Diagnosis not present

## 2016-05-10 DIAGNOSIS — G4730 Sleep apnea, unspecified: Secondary | ICD-10-CM | POA: Diagnosis not present

## 2016-05-10 DIAGNOSIS — Z79899 Other long term (current) drug therapy: Secondary | ICD-10-CM | POA: Diagnosis not present

## 2016-05-10 DIAGNOSIS — K219 Gastro-esophageal reflux disease without esophagitis: Secondary | ICD-10-CM | POA: Diagnosis not present

## 2016-05-10 DIAGNOSIS — K649 Unspecified hemorrhoids: Secondary | ICD-10-CM | POA: Diagnosis not present

## 2016-05-10 DIAGNOSIS — K5730 Diverticulosis of large intestine without perforation or abscess without bleeding: Secondary | ICD-10-CM | POA: Diagnosis not present

## 2016-05-10 DIAGNOSIS — I10 Essential (primary) hypertension: Secondary | ICD-10-CM | POA: Diagnosis not present

## 2016-05-10 DIAGNOSIS — K648 Other hemorrhoids: Secondary | ICD-10-CM | POA: Diagnosis not present

## 2016-05-10 DIAGNOSIS — J45909 Unspecified asthma, uncomplicated: Secondary | ICD-10-CM | POA: Diagnosis not present

## 2016-05-10 DIAGNOSIS — Z8719 Personal history of other diseases of the digestive system: Secondary | ICD-10-CM | POA: Diagnosis not present

## 2016-05-10 DIAGNOSIS — F329 Major depressive disorder, single episode, unspecified: Secondary | ICD-10-CM | POA: Diagnosis not present

## 2016-05-10 DIAGNOSIS — K5900 Constipation, unspecified: Secondary | ICD-10-CM | POA: Diagnosis not present

## 2016-05-10 DIAGNOSIS — I252 Old myocardial infarction: Secondary | ICD-10-CM | POA: Diagnosis not present

## 2016-05-10 DIAGNOSIS — K625 Hemorrhage of anus and rectum: Secondary | ICD-10-CM | POA: Diagnosis not present

## 2016-05-10 DIAGNOSIS — M1990 Unspecified osteoarthritis, unspecified site: Secondary | ICD-10-CM | POA: Diagnosis not present

## 2016-05-26 DIAGNOSIS — M79605 Pain in left leg: Secondary | ICD-10-CM | POA: Diagnosis not present

## 2016-05-26 DIAGNOSIS — M79604 Pain in right leg: Secondary | ICD-10-CM | POA: Diagnosis not present

## 2016-05-26 DIAGNOSIS — G2581 Restless legs syndrome: Secondary | ICD-10-CM | POA: Diagnosis not present

## 2016-05-29 DIAGNOSIS — L570 Actinic keratosis: Secondary | ICD-10-CM | POA: Diagnosis not present

## 2016-06-07 DIAGNOSIS — G2581 Restless legs syndrome: Secondary | ICD-10-CM | POA: Diagnosis not present

## 2016-06-07 DIAGNOSIS — Z6826 Body mass index (BMI) 26.0-26.9, adult: Secondary | ICD-10-CM | POA: Diagnosis not present

## 2016-06-26 DIAGNOSIS — I209 Angina pectoris, unspecified: Secondary | ICD-10-CM | POA: Diagnosis not present

## 2016-06-26 DIAGNOSIS — I25118 Atherosclerotic heart disease of native coronary artery with other forms of angina pectoris: Secondary | ICD-10-CM | POA: Diagnosis not present

## 2016-06-26 DIAGNOSIS — R0609 Other forms of dyspnea: Secondary | ICD-10-CM | POA: Diagnosis not present

## 2016-06-26 DIAGNOSIS — E785 Hyperlipidemia, unspecified: Secondary | ICD-10-CM | POA: Diagnosis not present

## 2016-07-04 DIAGNOSIS — M25512 Pain in left shoulder: Secondary | ICD-10-CM | POA: Diagnosis not present

## 2016-07-04 DIAGNOSIS — I2510 Atherosclerotic heart disease of native coronary artery without angina pectoris: Secondary | ICD-10-CM | POA: Diagnosis not present

## 2016-07-04 DIAGNOSIS — M7702 Medial epicondylitis, left elbow: Secondary | ICD-10-CM | POA: Diagnosis not present

## 2016-07-12 DIAGNOSIS — Z6826 Body mass index (BMI) 26.0-26.9, adult: Secondary | ICD-10-CM | POA: Diagnosis not present

## 2016-07-12 DIAGNOSIS — G2581 Restless legs syndrome: Secondary | ICD-10-CM | POA: Diagnosis not present

## 2016-07-12 DIAGNOSIS — J029 Acute pharyngitis, unspecified: Secondary | ICD-10-CM | POA: Diagnosis not present

## 2016-08-02 DIAGNOSIS — G2581 Restless legs syndrome: Secondary | ICD-10-CM | POA: Diagnosis not present

## 2016-08-02 DIAGNOSIS — Z6826 Body mass index (BMI) 26.0-26.9, adult: Secondary | ICD-10-CM | POA: Diagnosis not present

## 2016-08-15 DIAGNOSIS — G5622 Lesion of ulnar nerve, left upper limb: Secondary | ICD-10-CM | POA: Diagnosis not present

## 2016-08-15 DIAGNOSIS — M25512 Pain in left shoulder: Secondary | ICD-10-CM | POA: Diagnosis not present

## 2016-08-22 DIAGNOSIS — J383 Other diseases of vocal cords: Secondary | ICD-10-CM | POA: Diagnosis not present

## 2016-08-22 DIAGNOSIS — R0989 Other specified symptoms and signs involving the circulatory and respiratory systems: Secondary | ICD-10-CM | POA: Diagnosis not present

## 2016-08-22 DIAGNOSIS — K219 Gastro-esophageal reflux disease without esophagitis: Secondary | ICD-10-CM | POA: Diagnosis not present

## 2016-08-22 DIAGNOSIS — R1310 Dysphagia, unspecified: Secondary | ICD-10-CM | POA: Diagnosis not present

## 2016-08-22 DIAGNOSIS — R490 Dysphonia: Secondary | ICD-10-CM | POA: Diagnosis not present

## 2016-09-06 DIAGNOSIS — R1310 Dysphagia, unspecified: Secondary | ICD-10-CM | POA: Diagnosis not present

## 2016-09-13 DIAGNOSIS — Z6826 Body mass index (BMI) 26.0-26.9, adult: Secondary | ICD-10-CM | POA: Diagnosis not present

## 2016-09-13 DIAGNOSIS — Z0000 Encounter for general adult medical examination without abnormal findings: Secondary | ICD-10-CM | POA: Diagnosis not present

## 2016-09-13 DIAGNOSIS — G2581 Restless legs syndrome: Secondary | ICD-10-CM | POA: Diagnosis not present

## 2016-09-13 DIAGNOSIS — Z139 Encounter for screening, unspecified: Secondary | ICD-10-CM | POA: Diagnosis not present

## 2016-09-13 DIAGNOSIS — Z1389 Encounter for screening for other disorder: Secondary | ICD-10-CM | POA: Diagnosis not present

## 2016-09-26 DIAGNOSIS — I209 Angina pectoris, unspecified: Secondary | ICD-10-CM | POA: Diagnosis not present

## 2016-09-26 DIAGNOSIS — R0609 Other forms of dyspnea: Secondary | ICD-10-CM | POA: Diagnosis not present

## 2016-09-26 DIAGNOSIS — I25118 Atherosclerotic heart disease of native coronary artery with other forms of angina pectoris: Secondary | ICD-10-CM | POA: Diagnosis not present

## 2016-09-26 DIAGNOSIS — E785 Hyperlipidemia, unspecified: Secondary | ICD-10-CM | POA: Diagnosis not present

## 2016-09-27 DIAGNOSIS — M7702 Medial epicondylitis, left elbow: Secondary | ICD-10-CM | POA: Diagnosis not present

## 2016-09-27 DIAGNOSIS — G5602 Carpal tunnel syndrome, left upper limb: Secondary | ICD-10-CM | POA: Diagnosis not present

## 2016-10-03 DIAGNOSIS — L570 Actinic keratosis: Secondary | ICD-10-CM | POA: Diagnosis not present

## 2016-10-03 DIAGNOSIS — L578 Other skin changes due to chronic exposure to nonionizing radiation: Secondary | ICD-10-CM | POA: Diagnosis not present

## 2016-10-03 DIAGNOSIS — L821 Other seborrheic keratosis: Secondary | ICD-10-CM | POA: Diagnosis not present

## 2016-10-15 DIAGNOSIS — K219 Gastro-esophageal reflux disease without esophagitis: Secondary | ICD-10-CM | POA: Diagnosis not present

## 2016-10-15 DIAGNOSIS — R1310 Dysphagia, unspecified: Secondary | ICD-10-CM | POA: Diagnosis not present

## 2016-10-15 DIAGNOSIS — R0989 Other specified symptoms and signs involving the circulatory and respiratory systems: Secondary | ICD-10-CM | POA: Diagnosis not present

## 2016-10-24 DIAGNOSIS — E785 Hyperlipidemia, unspecified: Secondary | ICD-10-CM | POA: Diagnosis not present

## 2016-10-24 DIAGNOSIS — R0609 Other forms of dyspnea: Secondary | ICD-10-CM | POA: Diagnosis not present

## 2016-10-24 DIAGNOSIS — I25118 Atherosclerotic heart disease of native coronary artery with other forms of angina pectoris: Secondary | ICD-10-CM | POA: Diagnosis not present

## 2016-10-24 DIAGNOSIS — I255 Ischemic cardiomyopathy: Secondary | ICD-10-CM | POA: Diagnosis not present

## 2016-10-24 DIAGNOSIS — I209 Angina pectoris, unspecified: Secondary | ICD-10-CM | POA: Diagnosis not present

## 2016-10-24 HISTORY — DX: Ischemic cardiomyopathy: I25.5

## 2016-11-26 DIAGNOSIS — I25118 Atherosclerotic heart disease of native coronary artery with other forms of angina pectoris: Secondary | ICD-10-CM | POA: Diagnosis not present

## 2016-11-26 DIAGNOSIS — I255 Ischemic cardiomyopathy: Secondary | ICD-10-CM | POA: Diagnosis not present

## 2016-11-29 DIAGNOSIS — I252 Old myocardial infarction: Secondary | ICD-10-CM | POA: Diagnosis not present

## 2016-11-29 DIAGNOSIS — I10 Essential (primary) hypertension: Secondary | ICD-10-CM | POA: Diagnosis not present

## 2016-11-29 DIAGNOSIS — E875 Hyperkalemia: Secondary | ICD-10-CM | POA: Diagnosis not present

## 2016-11-29 DIAGNOSIS — I2510 Atherosclerotic heart disease of native coronary artery without angina pectoris: Secondary | ICD-10-CM | POA: Diagnosis not present

## 2016-11-29 DIAGNOSIS — G2581 Restless legs syndrome: Secondary | ICD-10-CM | POA: Diagnosis not present

## 2016-11-29 DIAGNOSIS — I255 Ischemic cardiomyopathy: Secondary | ICD-10-CM | POA: Diagnosis not present

## 2016-11-29 DIAGNOSIS — G4733 Obstructive sleep apnea (adult) (pediatric): Secondary | ICD-10-CM | POA: Diagnosis not present

## 2016-11-29 DIAGNOSIS — E785 Hyperlipidemia, unspecified: Secondary | ICD-10-CM | POA: Diagnosis not present

## 2016-11-30 ENCOUNTER — Ambulatory Visit (INDEPENDENT_AMBULATORY_CARE_PROVIDER_SITE_OTHER): Payer: PPO | Admitting: Pulmonary Disease

## 2016-11-30 ENCOUNTER — Other Ambulatory Visit (INDEPENDENT_AMBULATORY_CARE_PROVIDER_SITE_OTHER): Payer: PPO

## 2016-11-30 ENCOUNTER — Encounter: Payer: Self-pay | Admitting: Pulmonary Disease

## 2016-11-30 VITALS — BP 118/74 | HR 64 | Ht 70.0 in | Wt 176.6 lb

## 2016-11-30 DIAGNOSIS — G2581 Restless legs syndrome: Secondary | ICD-10-CM

## 2016-11-30 DIAGNOSIS — G4733 Obstructive sleep apnea (adult) (pediatric): Secondary | ICD-10-CM | POA: Diagnosis not present

## 2016-11-30 DIAGNOSIS — Z9989 Dependence on other enabling machines and devices: Secondary | ICD-10-CM | POA: Diagnosis not present

## 2016-11-30 HISTORY — DX: Restless legs syndrome: G25.81

## 2016-11-30 LAB — IRON AND TIBC
%SAT: 21 % (ref 15–60)
Iron: 68 ug/dL (ref 50–180)
TIBC: 330 ug/dL (ref 250–425)
UIBC: 262 ug/dL

## 2016-11-30 LAB — FERRITIN: Ferritin: 63.4 ng/mL (ref 22.0–322.0)

## 2016-11-30 NOTE — Assessment & Plan Note (Signed)
Check iron studies today.  Now off clonazepam, better controlled with Mirapex

## 2016-11-30 NOTE — Progress Notes (Signed)
   Subjective:    Patient ID: Shane Massey, male    DOB: April 09, 1941, 76 y.o.   MRN: [id number]  HPI  76 year old for FU of OSA He was diagnosed with OSA more than 15 years ago by DR chodri in [place] and was maintained on CPAP since then.as a dental guard.  CPAP download on his last visit continued to show events 5/hour.  He stopped using his CPAP in 02/2016 because he felt that mask issues hours worse than the problem itself. His wife needed reports that he continues to remain sleepy in the daytime and also sleep every time he sits down or reads or watches TV  He was maintained on clonazepam for more than 10 years. This was discontinued last year and he is now on Mirapex and does has been titrated to 1.5 mg at bedtime but he continues to complain of leg movements that wake him up from sleep  He has CAD status post stents   Significant tests/ events reviewed 11/2003 PSG showed mild OSA RDI 10/h 12/2003 wt 180 lbs - CPAP 9 cm  Review of Systems neg for any significant sore throat, dysphagia, itching, sneezing, nasal congestion or excess/ purulent secretions, fever, chills, sweats, unintended wt loss, pleuritic or exertional cp, hempoptysis, orthopnea pnd or change in chronic leg swelling. Also denies presyncope, palpitations, heartburn, abdominal pain, nausea, vomiting, diarrhea or change in bowel or urinary habits, dysuria,hematuria, rash, arthralgias, visual complaints, headache, numbness weakness or ataxia.     Objective:   Physical Exam  Gen. Pleasant, well-nourished, in no distress ENT - no thrush, no post nasal drip Neck: No JVD, no thyromegaly, no carotid bruits Lungs: no use of accessory muscles, no dullness to percussion, clear without rales or rhonchi  Cardiovascular: Rhythm regular, heart sounds  normal, no murmurs or gallops, no peripheral edema Musculoskeletal: No deformities, no cyanosis or clubbing        Assessment & Plan:

## 2016-11-30 NOTE — Patient Instructions (Signed)
Check iron studies today.  Your obstructive sleep apnea was of mild degree-okay to stay off CPAP machine  We discussed strategies for excessive daytime sleepiness - Light exposure to 30 minutes in the morning  -planned nap in the afternoon for 1 hour with alarm - Stay active

## 2016-11-30 NOTE — Assessment & Plan Note (Signed)
obstructive sleep apnea was of mild degree-okay to stay off CPAP machine  We discussed strategies for excessive daytime sleepiness - Light exposure to 30 minutes in the morning  -planned nap in the afternoon for 1 hour with alarm - Stay active

## 2016-12-03 ENCOUNTER — Telehealth: Payer: Self-pay | Admitting: Pulmonary Disease

## 2016-12-03 NOTE — Telephone Encounter (Signed)
Notes recorded by Rigoberto Noel, MD on 12/03/2016 at 12:27 PM EDT Iron levels are ok ----------------------- Spoke with pt's wife. She is aware of pt's lab results. Nothing further was needed.

## 2016-12-13 DIAGNOSIS — G2581 Restless legs syndrome: Secondary | ICD-10-CM | POA: Diagnosis not present

## 2016-12-13 DIAGNOSIS — F3160 Bipolar disorder, current episode mixed, unspecified: Secondary | ICD-10-CM | POA: Diagnosis not present

## 2016-12-13 DIAGNOSIS — Z139 Encounter for screening, unspecified: Secondary | ICD-10-CM | POA: Diagnosis not present

## 2016-12-13 DIAGNOSIS — M79609 Pain in unspecified limb: Secondary | ICD-10-CM | POA: Diagnosis not present

## 2016-12-13 DIAGNOSIS — Z6824 Body mass index (BMI) 24.0-24.9, adult: Secondary | ICD-10-CM | POA: Diagnosis not present

## 2016-12-24 DIAGNOSIS — R7303 Prediabetes: Secondary | ICD-10-CM | POA: Diagnosis not present

## 2016-12-24 DIAGNOSIS — I10 Essential (primary) hypertension: Secondary | ICD-10-CM | POA: Diagnosis not present

## 2016-12-24 DIAGNOSIS — E785 Hyperlipidemia, unspecified: Secondary | ICD-10-CM | POA: Diagnosis not present

## 2016-12-24 DIAGNOSIS — E039 Hypothyroidism, unspecified: Secondary | ICD-10-CM | POA: Diagnosis not present

## 2017-01-14 DIAGNOSIS — E785 Hyperlipidemia, unspecified: Secondary | ICD-10-CM | POA: Diagnosis not present

## 2017-01-14 DIAGNOSIS — Z125 Encounter for screening for malignant neoplasm of prostate: Secondary | ICD-10-CM | POA: Diagnosis not present

## 2017-01-14 DIAGNOSIS — Z6825 Body mass index (BMI) 25.0-25.9, adult: Secondary | ICD-10-CM | POA: Diagnosis not present

## 2017-01-14 DIAGNOSIS — E039 Hypothyroidism, unspecified: Secondary | ICD-10-CM | POA: Diagnosis not present

## 2017-01-14 DIAGNOSIS — Z139 Encounter for screening, unspecified: Secondary | ICD-10-CM | POA: Diagnosis not present

## 2017-01-14 DIAGNOSIS — J305 Allergic rhinitis due to food: Secondary | ICD-10-CM | POA: Diagnosis not present

## 2017-01-14 DIAGNOSIS — I2510 Atherosclerotic heart disease of native coronary artery without angina pectoris: Secondary | ICD-10-CM | POA: Diagnosis not present

## 2017-01-14 DIAGNOSIS — J302 Other seasonal allergic rhinitis: Secondary | ICD-10-CM | POA: Diagnosis not present

## 2017-01-31 DIAGNOSIS — L821 Other seborrheic keratosis: Secondary | ICD-10-CM | POA: Diagnosis not present

## 2017-01-31 DIAGNOSIS — L570 Actinic keratosis: Secondary | ICD-10-CM | POA: Diagnosis not present

## 2017-01-31 DIAGNOSIS — L300 Nummular dermatitis: Secondary | ICD-10-CM | POA: Diagnosis not present

## 2017-01-31 DIAGNOSIS — D1801 Hemangioma of skin and subcutaneous tissue: Secondary | ICD-10-CM | POA: Diagnosis not present

## 2017-03-08 DIAGNOSIS — Z23 Encounter for immunization: Secondary | ICD-10-CM | POA: Diagnosis not present

## 2017-03-25 DIAGNOSIS — L578 Other skin changes due to chronic exposure to nonionizing radiation: Secondary | ICD-10-CM | POA: Diagnosis not present

## 2017-03-25 DIAGNOSIS — L820 Inflamed seborrheic keratosis: Secondary | ICD-10-CM | POA: Diagnosis not present

## 2017-03-25 DIAGNOSIS — L821 Other seborrheic keratosis: Secondary | ICD-10-CM | POA: Diagnosis not present

## 2017-04-08 ENCOUNTER — Encounter: Payer: Self-pay | Admitting: Cardiology

## 2017-04-08 ENCOUNTER — Ambulatory Visit (INDEPENDENT_AMBULATORY_CARE_PROVIDER_SITE_OTHER): Payer: PPO | Admitting: Cardiology

## 2017-04-08 VITALS — BP 114/62 | HR 68 | Resp 12 | Ht 70.0 in | Wt 175.0 lb

## 2017-04-08 DIAGNOSIS — R0600 Dyspnea, unspecified: Secondary | ICD-10-CM

## 2017-04-08 DIAGNOSIS — R0609 Other forms of dyspnea: Secondary | ICD-10-CM | POA: Diagnosis not present

## 2017-04-08 DIAGNOSIS — I2510 Atherosclerotic heart disease of native coronary artery without angina pectoris: Secondary | ICD-10-CM | POA: Diagnosis not present

## 2017-04-08 DIAGNOSIS — I255 Ischemic cardiomyopathy: Secondary | ICD-10-CM | POA: Diagnosis not present

## 2017-04-08 NOTE — Progress Notes (Signed)
Cardiology Office Note:    Date:  04/08/2017   ID:  Shane Massey, DOB Feb 27, 1941, MRN [id number]  PCP:  Maryella Shivers, MD  Cardiologist:  Jenne Campus, MD    Referring MD: Maryella Shivers, MD   Chief Complaint  Patient presents with  . Follow-up  Have some strange symptoms  History of Present Illness:    Shane Massey is a 76 y.o. male with coronary artery disease he describes episodes of lightheadedness and dizziness that happened twice it always happen after exercise.  Last time it happened he went to his hunting stand he climbed a tree and he started feeling poorly is having a chest pain tightness squeezing pressure burning chest just profound dizziness to the point that he ended up going down of his stent.  Another time it happened when he was walking he had to slow down and stop because of dizziness.  Also described to have some dizziness during the night while walking.  Denies having any chest pain tightness squeezing pressure burning chest but he said there was something not right in my chest.  Past Medical History:  Diagnosis Date  . GERD (gastroesophageal reflux disease)   . Hypothyroidism   . OSA (obstructive sleep apnea)     Past Surgical History:  Procedure Laterality Date  . heart stent Left 09/2015    Current Medications: Current Meds  Medication Sig  . aspirin 81 MG tablet Take 81 mg by mouth daily.  Marland Kitchen atorvastatin (LIPITOR) 80 MG tablet Take 80 mg by mouth daily.  . calcium carbonate (CALCIUM 600) 600 MG TABS tablet Take 1 tablet by mouth daily.  . clopidogrel (PLAVIX) 75 MG tablet Take 75 mg by mouth daily.  . Coenzyme Q-10 200 MG CAPS Take 200 mg by mouth daily.  . isosorbide mononitrate (IMDUR) 30 MG 24 hr tablet Take 30 mg by mouth daily.  Marland Kitchen levothyroxine (SYNTHROID, LEVOTHROID) 50 MCG tablet Take 50 mcg by mouth daily before breakfast.  . Multiple Vitamin (MULTIVITAMIN) tablet Take 1 tablet by mouth daily.  . pantoprazole (PROTONIX) 40 MG  tablet Take 40 mg by mouth daily.  . pramipexole (MIRAPEX) 0.5 MG tablet Take 0.5 mg by mouth at bedtime.     Allergies:   Patient has no known allergies.   Social History   Social History  . Marital status: Married    Spouse name: N/A  . Number of children: N/A  . Years of education: N/A   Social History Main Topics  . Smoking status: Former Smoker    Packs/day: 0.50    Years: 4.00    Types: Cigarettes    Quit date: 06/11/1958  . Smokeless tobacco: Former Systems developer    Types: Chew    Quit date: 12/01/1958  . Alcohol use No  . Drug use: No  . Sexual activity: Not Asked   Other Topics Concern  . None   Social History Narrative  . None     Family History: The patient's family history includes Asthma in his brother and maternal grandmother; Heart attack in his brother; Heart disease in his brother; Stroke in his father. ROS:   Please see the history of present illness.    All 14 point review of systems negative except as described per history of present illness  EKGs/Labs/Other Studies Reviewed:      Recent Labs: No results found for requested labs within last 8760 hours.  Recent Lipid Panel No results found for: CHOL, TRIG, HDL, CHOLHDL, VLDL, LDLCALC, LDLDIRECT  Physical  Exam:    VS:  BP 114/62   Pulse 68   Resp 12   Ht 5\' 10"  (1.778 m)   Wt 175 lb (79.4 kg)   BMI 25.11 kg/m     Wt Readings from Last 3 Encounters:  04/08/17 175 lb (79.4 kg)  11/30/16 176 lb 9.6 oz (80.1 kg)  11/14/15 188 lb (85.3 kg)     GEN:  Well nourished, well developed in no acute distress HEENT: Normal NECK: No JVD; No carotid bruits LYMPHATICS: No lymphadenopathy CARDIAC: RRR, no murmurs, no rubs, no gallops RESPIRATORY:  Clear to auscultation without rales, wheezing or rhonchi  ABDOMEN: Soft, non-tender, non-distended MUSCULOSKELETAL:  No edema; No deformity  SKIN: Warm and dry LOWER EXTREMITIES: no swelling NEUROLOGIC:  Alert and oriented x 3 PSYCHIATRIC:  Normal affect    ASSESSMENT:    1. Ischemic cardiomyopathy   2. Dyspnea on exertion   3. Coronary artery disease involving native heart without angina pectoris, unspecified vessel or lesion type    PLAN:    In order of problems listed above:  1. Ischemic cardiomyopathy: On appropriate medication that he can tolerate I will ask him to have echocardiogram to recheck left ventricular ejection fraction. 2. Coronary artery disease status post PTCA and stenting to mid LAD in 2017.  He does have some strange symptoms we will schedule him to have a stress test. 3. Because of dizziness I will ask him to wear a event recorder. 4. Dyslipidemia: We will call his primary care physician to get fasting lipid profile.   Medication Adjustments/Labs and Tests Ordered: Current medicines are reviewed at length with the patient today.  Concerns regarding medicines are outlined above.  No orders of the defined types were placed in this encounter.  Medication changes: No orders of the defined types were placed in this encounter.   Signed, Park Liter, MD, [facility] 04/08/2017 3:51 PM    [place]

## 2017-04-08 NOTE — Patient Instructions (Signed)
Medication Instructions:  Your physician recommends that you continue on your current medications as directed. Please refer to the Current Medication list given to you today.  1. Avoid all over-the-counter antihistamines except Claritin/Loratadine and Zyrtec/Cetrizine. 2. Avoid all combination including cold sinus allergies flu decongestant and sleep medications 3. You can use Robitussin DM Mucinex and Mucinex DM for cough. 4. can use Tylenol aspirin ibuprofen and naproxen but no combinations such as sleep or sinus.  Labwork: None   Testing/Procedures: Your physician has requested that you have an echocardiogram. Echocardiography is a painless test that uses sound waves to create images of your heart. It provides your doctor with information about the size and shape of your heart and how well your heart's chambers and valves are working. This procedure takes approximately one hour. There are no restrictions for this procedure.  Your physician has recommended that you wear an event monitor. Event monitors are medical devices that record the heart's electrical activity. Doctors most often [place] these monitors to diagnose arrhythmias. Arrhythmias are problems with the speed or rhythm of the heartbeat. The monitor is a small, portable device. You can wear one while you do your normal daily activities. This is usually used to diagnose what is causing palpitations/syncope (passing out).  Stress Test Directions for [facility]: 1.) Please check in at the outpatient center at [facility] the day of your testing. 2.) Nothing to eat or drink after midnight prior to testing. Please hold all medications the day of your test.  3.) Please be aware that the test can take up to 3-4 hours.  4.) Should you have any problem with the appointment date or time, please call [phone].   Follow-Up: Your physician recommends that you schedule a follow-up appointment in: 2 months   Any Other Special  Instructions Will Be Listed Below (If Applicable).  Please note that any paperwork needing to be filled out by the provider will need to be addressed at the front desk prior to seeing the provider. Please note that any paperwork FMLA, Disability or other documents regarding health condition is subject to a $25.00 charge that must be received prior to completion of paperwork in the form of a money order or check.     If you need a refill on your cardiac medications before your next appointment, please call your pharmacy.

## 2017-04-11 ENCOUNTER — Ambulatory Visit (HOSPITAL_BASED_OUTPATIENT_CLINIC_OR_DEPARTMENT_OTHER)
Admission: RE | Admit: 2017-04-11 | Discharge: 2017-04-11 | Disposition: A | Discharge status: Home / Self Care | Payer: PPO | Source: Ambulatory Visit | Attending: Cardiology | Admitting: Cardiology

## 2017-04-11 ENCOUNTER — Ambulatory Visit: Payer: PPO

## 2017-04-11 DIAGNOSIS — R0609 Other forms of dyspnea: Secondary | ICD-10-CM | POA: Insufficient documentation

## 2017-04-11 DIAGNOSIS — I255 Ischemic cardiomyopathy: Secondary | ICD-10-CM | POA: Diagnosis not present

## 2017-04-11 DIAGNOSIS — I2510 Atherosclerotic heart disease of native coronary artery without angina pectoris: Secondary | ICD-10-CM

## 2017-04-11 DIAGNOSIS — R0600 Dyspnea, unspecified: Secondary | ICD-10-CM

## 2017-04-11 NOTE — Progress Notes (Signed)
  Echocardiogram 2D Echocardiogram has been performed.  Shane Massey 04/11/2017, 8:56 AM

## 2017-04-17 DIAGNOSIS — I2510 Atherosclerotic heart disease of native coronary artery without angina pectoris: Secondary | ICD-10-CM | POA: Diagnosis not present

## 2017-04-25 DIAGNOSIS — I255 Ischemic cardiomyopathy: Secondary | ICD-10-CM

## 2017-04-25 DIAGNOSIS — I517 Cardiomegaly: Secondary | ICD-10-CM | POA: Diagnosis not present

## 2017-04-25 DIAGNOSIS — I2510 Atherosclerotic heart disease of native coronary artery without angina pectoris: Secondary | ICD-10-CM | POA: Diagnosis not present

## 2017-04-25 DIAGNOSIS — I499 Cardiac arrhythmia, unspecified: Secondary | ICD-10-CM | POA: Diagnosis not present

## 2017-04-25 DIAGNOSIS — R0609 Other forms of dyspnea: Secondary | ICD-10-CM | POA: Diagnosis not present

## 2017-04-25 DIAGNOSIS — I252 Old myocardial infarction: Secondary | ICD-10-CM | POA: Diagnosis not present

## 2017-04-25 DIAGNOSIS — I5189 Other ill-defined heart diseases: Secondary | ICD-10-CM | POA: Diagnosis not present

## 2017-04-26 ENCOUNTER — Telehealth: Payer: Self-pay

## 2017-04-26 DIAGNOSIS — I255 Ischemic cardiomyopathy: Secondary | ICD-10-CM

## 2017-04-26 DIAGNOSIS — R0609 Other forms of dyspnea: Secondary | ICD-10-CM

## 2017-04-26 DIAGNOSIS — R0600 Dyspnea, unspecified: Secondary | ICD-10-CM

## 2017-04-26 DIAGNOSIS — I2510 Atherosclerotic heart disease of native coronary artery without angina pectoris: Secondary | ICD-10-CM

## 2017-04-26 NOTE — Telephone Encounter (Signed)
pts wife advised of results.

## 2017-05-13 DIAGNOSIS — H524 Presbyopia: Secondary | ICD-10-CM | POA: Diagnosis not present

## 2017-05-13 DIAGNOSIS — H43819 Vitreous degeneration, unspecified eye: Secondary | ICD-10-CM | POA: Diagnosis not present

## 2017-05-13 DIAGNOSIS — H16103 Unspecified superficial keratitis, bilateral: Secondary | ICD-10-CM | POA: Diagnosis not present

## 2017-06-14 ENCOUNTER — Ambulatory Visit (INDEPENDENT_AMBULATORY_CARE_PROVIDER_SITE_OTHER): Payer: PPO | Admitting: Cardiology

## 2017-06-14 ENCOUNTER — Encounter: Payer: Self-pay | Admitting: Cardiology

## 2017-06-14 VITALS — BP 128/70 | HR 65 | Ht 70.0 in | Wt 178.0 lb

## 2017-06-14 DIAGNOSIS — I2510 Atherosclerotic heart disease of native coronary artery without angina pectoris: Secondary | ICD-10-CM

## 2017-06-14 DIAGNOSIS — G4733 Obstructive sleep apnea (adult) (pediatric): Secondary | ICD-10-CM

## 2017-06-14 DIAGNOSIS — I255 Ischemic cardiomyopathy: Secondary | ICD-10-CM

## 2017-06-14 DIAGNOSIS — Z9989 Dependence on other enabling machines and devices: Secondary | ICD-10-CM | POA: Diagnosis not present

## 2017-06-14 DIAGNOSIS — R42 Dizziness and giddiness: Secondary | ICD-10-CM

## 2017-06-14 HISTORY — DX: Dizziness and giddiness: R42

## 2017-06-14 NOTE — Progress Notes (Signed)
Cardiology Office Note:    Date:  06/14/2017   ID:  Shane Massey, DOB 09-22-1940, MRN [id number]  PCP:  Maryella Shivers, MD  Cardiologist:  Jenne Campus, MD    Referring MD: Maryella Shivers, MD   Chief Complaint  Patient presents with  . Follow-up  Doing well  History of Present Illness:    Shane Massey is a 77 y.o. male with history of dizziness and coronary artery disease.  We have been investigating issue of dizziness for last few visits.  He had 2 episode of profound weakness and dizziness that happens when he went to [place] he went to stand and then had to go down because of dizziness.  Never completely passed out there is no chest pain no shortness of breath associated with this sensation.  I did echocardiogram which showed relatively preserved left ventricular ejection fraction, stress test was done which showed no evidence of ischemia.  Event recorder showed only some extrasystole.  He tells me that he had episode of dizziness when he was wearing event recorder.  After conversation today I realized that we may be dealing with some vasovagal response.  He tells me when he go hunting he try not to drink fluids so he will not have to urinate.  Luckily hunting season is over and I advised him to stay well-hydrated.  Also told him if he have another episode of dizziness or syncope he need to let me know.  Past Medical History:  Diagnosis Date  . GERD (gastroesophageal reflux disease)   . Hypothyroidism   . OSA (obstructive sleep apnea)     Past Surgical History:  Procedure Laterality Date  . heart stent Left 09/2015    Current Medications: Current Meds  Medication Sig  . aspirin 81 MG tablet Take 81 mg by mouth daily.  Marland Kitchen atorvastatin (LIPITOR) 80 MG tablet Take 80 mg by mouth daily.  . calcium carbonate (CALCIUM 600) 600 MG TABS tablet Take 1 tablet by mouth daily.  . clopidogrel (PLAVIX) 75 MG tablet Take 75 mg by mouth daily.  . Coenzyme Q-10 200 MG CAPS Take 200  mg by mouth daily.  . isosorbide mononitrate (IMDUR) 30 MG 24 hr tablet Take 30 mg by mouth daily.  Marland Kitchen levothyroxine (SYNTHROID, LEVOTHROID) 50 MCG tablet Take 50 mcg by mouth daily before breakfast.  . Multiple Vitamin (MULTIVITAMIN) tablet Take 1 tablet by mouth daily.  . pantoprazole (PROTONIX) 40 MG tablet Take 40 mg by mouth daily.  . pramipexole (MIRAPEX) 0.5 MG tablet Take 0.5 mg by mouth at bedtime.     Allergies:   Patient has no known allergies.   Social History   Socioeconomic History  . Marital status: Married    Spouse name: None  . Number of children: None  . Years of education: None  . Highest education level: None  Social Needs  . Financial resource strain: None  . Food insecurity - worry: None  . Food insecurity - inability: None  . Transportation needs - medical: None  . Transportation needs - non-medical: None  Occupational History  . None  Tobacco Use  . Smoking status: Former Smoker    Packs/day: 0.50    Years: 4.00    Pack years: 2.00    Types: Cigarettes    Last attempt to quit: 06/11/1958    Years since quitting: 59.0  . Smokeless tobacco: Former Systems developer    Types: [place] date: 12/01/1958  Substance and Sexual Activity  .  Alcohol use: No    Alcohol/week: 0.0 oz  . Drug use: No  . Sexual activity: None  Other Topics Concern  . None  Social History Narrative  . None     Family History: The patient's family history includes Asthma in his brother and maternal grandmother; Heart attack in his brother; Heart disease in his brother; Stroke in his father. ROS:   Please see the history of present illness.    All 14 point review of systems negative except as described per history of present illness  EKGs/Labs/Other Studies Reviewed:      Recent Labs: No results found for requested labs within last 8760 hours.  Recent Lipid Panel No results found for: CHOL, TRIG, HDL, CHOLHDL, VLDL, LDLCALC, LDLDIRECT  Physical Exam:    VS:  BP 128/70 (BP  Location: Right Arm, Patient Position: Sitting, Cuff Size: Normal)   Pulse 65   Ht 5\' 10"  (1.778 m)   Wt 178 lb (80.7 kg)   SpO2 98%   BMI 25.54 kg/m     Wt Readings from Last 3 Encounters:  06/14/17 178 lb (80.7 kg)  04/08/17 175 lb (79.4 kg)  11/30/16 176 lb 9.6 oz (80.1 kg)     GEN:  Well nourished, well developed in no acute distress HEENT: Normal NECK: No JVD; No carotid bruits LYMPHATICS: No lymphadenopathy CARDIAC: RRR, no murmurs, no rubs, no gallops RESPIRATORY:  Clear to auscultation without rales, wheezing or rhonchi  ABDOMEN: Soft, non-tender, non-distended MUSCULOSKELETAL:  No edema; No deformity  SKIN: Warm and dry LOWER EXTREMITIES: no swelling NEUROLOGIC:  Alert and oriented x 3 PSYCHIATRIC:  Normal affect   ASSESSMENT:    1. Coronary artery disease involving native heart without angina pectoris, unspecified vessel or lesion type   2. Ischemic cardiomyopathy   3. OSA on CPAP   4. Dizziness    PLAN:    In order of problems listed above:  1. Coronary artery disease: Asymptomatic lower recent stress test showed no evidence of ischemia. 2. Ischemic cardiomyopathy: Left ventricular ejection fraction relatively preserved we will continue present management. 3. Obstructive sleep apnea followed by internal medicine team. 4. Dizziness: Quite extensive workup been done so far no worrisome finding.  No ischemia preserved ejection fraction, nothing important on event recorder.  We will continue monitoring he will stay well-hydrated.  Overall he seems to be doing well I see him back in 6 months.   Medication Adjustments/Labs and Tests Ordered: Current medicines are reviewed at length with the patient today.  Concerns regarding medicines are outlined above.  No orders of the defined types were placed in this encounter.  Medication changes: No orders of the defined types were placed in this encounter.   Signed, Park Liter, MD, [facility] 06/14/2017 11:03 AM      Shane Massey

## 2017-06-14 NOTE — Patient Instructions (Signed)
Medication Instructions:  Your physician recommends that you continue on your current medications as directed. Please refer to the Current Medication list given to you today.  Labwork: None ordered  Testing/Procedures: None ordered  Follow-Up: Your physician recommends that you schedule a follow-up appointment in: 6 months with Dr. Krasowski in [place]  Any Other Special Instructions Will Be Listed Below (If Applicable).     If you need a refill on your cardiac medications before your next appointment, please call your pharmacy.   

## 2017-07-31 DIAGNOSIS — Z6825 Body mass index (BMI) 25.0-25.9, adult: Secondary | ICD-10-CM | POA: Diagnosis not present

## 2017-07-31 DIAGNOSIS — Z7189 Other specified counseling: Secondary | ICD-10-CM | POA: Diagnosis not present

## 2017-07-31 DIAGNOSIS — M5431 Sciatica, right side: Secondary | ICD-10-CM | POA: Diagnosis not present

## 2017-08-01 ENCOUNTER — Other Ambulatory Visit: Payer: Self-pay

## 2017-08-01 MED ORDER — CLOPIDOGREL BISULFATE 75 MG PO TABS: 75.0000 mg | ORAL_TABLET | Freq: Every day | ORAL | 3 refills | Status: DC

## 2017-08-12 DIAGNOSIS — H04123 Dry eye syndrome of bilateral lacrimal glands: Secondary | ICD-10-CM | POA: Diagnosis not present

## 2017-08-20 DIAGNOSIS — Z0000 Encounter for general adult medical examination without abnormal findings: Secondary | ICD-10-CM | POA: Diagnosis not present

## 2017-08-20 DIAGNOSIS — Z1331 Encounter for screening for depression: Secondary | ICD-10-CM | POA: Diagnosis not present

## 2017-08-20 DIAGNOSIS — Z139 Encounter for screening, unspecified: Secondary | ICD-10-CM | POA: Diagnosis not present

## 2017-09-04 ENCOUNTER — Telehealth: Payer: Self-pay | Admitting: Cardiology

## 2017-09-04 MED ORDER — ISOSORBIDE MONONITRATE ER 30 MG PO TB24: 30.0000 mg | ORAL_TABLET | Freq: Every day | ORAL | 3 refills | Status: DC

## 2017-09-04 MED ORDER — ATORVASTATIN CALCIUM 80 MG PO TABS: 80.0000 mg | ORAL_TABLET | Freq: Every day | ORAL | 3 refills | Status: DC

## 2017-09-04 NOTE — Telephone Encounter (Signed)
°*  STAT* If patient is at the pharmacy, call can be transferred to refill team.   1. Which medications need to be refilled? (please list name of each medication and dose if known) Isosorbide 30mg  tablet takes 1 daily   2. Which pharmacy/location (including street and city if local pharmacy) is medication to be sent to?[place] (mail order)  3. Do they need a 30 day or 90 day supply? 90   *STAT* If patient is at the pharmacy, call can be transferred to refill team.   1. Which medications need to be refilled? (please list name of each medication and dose if known) Atorvastatin 80mg  takes 1 daily   2. Which pharmacy/location (including street and city if local pharmacy) is medication to be sent to? [place]  3. Do they need a 30 day or 90 day supply? [place]

## 2017-09-04 NOTE — Telephone Encounter (Signed)
Refill sent.

## 2017-09-06 DIAGNOSIS — M1990 Unspecified osteoarthritis, unspecified site: Secondary | ICD-10-CM | POA: Diagnosis not present

## 2017-09-06 DIAGNOSIS — Z6825 Body mass index (BMI) 25.0-25.9, adult: Secondary | ICD-10-CM | POA: Diagnosis not present

## 2017-09-09 DIAGNOSIS — N393 Stress incontinence (female) (male): Secondary | ICD-10-CM | POA: Diagnosis not present

## 2017-09-09 DIAGNOSIS — Z6826 Body mass index (BMI) 26.0-26.9, adult: Secondary | ICD-10-CM | POA: Diagnosis not present

## 2017-09-23 DIAGNOSIS — R05 Cough: Secondary | ICD-10-CM | POA: Diagnosis not present

## 2017-09-23 DIAGNOSIS — Z6827 Body mass index (BMI) 27.0-27.9, adult: Secondary | ICD-10-CM | POA: Diagnosis not present

## 2017-09-23 DIAGNOSIS — J040 Acute laryngitis: Secondary | ICD-10-CM | POA: Diagnosis not present

## 2017-10-07 DIAGNOSIS — H9193 Unspecified hearing loss, bilateral: Secondary | ICD-10-CM | POA: Diagnosis not present

## 2017-10-07 DIAGNOSIS — R351 Nocturia: Secondary | ICD-10-CM | POA: Diagnosis not present

## 2017-10-07 DIAGNOSIS — G4730 Sleep apnea, unspecified: Secondary | ICD-10-CM | POA: Diagnosis not present

## 2017-10-07 DIAGNOSIS — H938X2 Other specified disorders of left ear: Secondary | ICD-10-CM | POA: Diagnosis not present

## 2017-10-07 DIAGNOSIS — Z6827 Body mass index (BMI) 27.0-27.9, adult: Secondary | ICD-10-CM | POA: Diagnosis not present

## 2017-10-07 DIAGNOSIS — H9190 Unspecified hearing loss, unspecified ear: Secondary | ICD-10-CM | POA: Diagnosis not present

## 2017-10-10 DIAGNOSIS — G4733 Obstructive sleep apnea (adult) (pediatric): Secondary | ICD-10-CM | POA: Diagnosis not present

## 2017-10-14 DIAGNOSIS — G4733 Obstructive sleep apnea (adult) (pediatric): Secondary | ICD-10-CM | POA: Diagnosis not present

## 2017-11-01 DIAGNOSIS — N393 Stress incontinence (female) (male): Secondary | ICD-10-CM | POA: Diagnosis not present

## 2017-11-01 DIAGNOSIS — Z6841 Body Mass Index (BMI) 40.0 and over, adult: Secondary | ICD-10-CM | POA: Diagnosis not present

## 2017-11-01 DIAGNOSIS — R0683 Snoring: Secondary | ICD-10-CM | POA: Diagnosis not present

## 2017-12-11 DIAGNOSIS — L578 Other skin changes due to chronic exposure to nonionizing radiation: Secondary | ICD-10-CM | POA: Diagnosis not present

## 2017-12-11 DIAGNOSIS — C44622 Squamous cell carcinoma of skin of right upper limb, including shoulder: Secondary | ICD-10-CM | POA: Diagnosis not present

## 2017-12-11 DIAGNOSIS — L570 Actinic keratosis: Secondary | ICD-10-CM | POA: Diagnosis not present

## 2017-12-11 DIAGNOSIS — L821 Other seborrheic keratosis: Secondary | ICD-10-CM | POA: Diagnosis not present

## 2017-12-20 DIAGNOSIS — I2510 Atherosclerotic heart disease of native coronary artery without angina pectoris: Secondary | ICD-10-CM | POA: Diagnosis not present

## 2017-12-20 DIAGNOSIS — J45909 Unspecified asthma, uncomplicated: Secondary | ICD-10-CM | POA: Diagnosis not present

## 2017-12-20 DIAGNOSIS — Z79899 Other long term (current) drug therapy: Secondary | ICD-10-CM | POA: Diagnosis not present

## 2017-12-20 DIAGNOSIS — E039 Hypothyroidism, unspecified: Secondary | ICD-10-CM | POA: Diagnosis not present

## 2017-12-20 DIAGNOSIS — G2581 Restless legs syndrome: Secondary | ICD-10-CM | POA: Diagnosis not present

## 2017-12-20 DIAGNOSIS — S60456A Superficial foreign body of right little finger, initial encounter: Secondary | ICD-10-CM | POA: Diagnosis not present

## 2017-12-20 DIAGNOSIS — Z7982 Long term (current) use of aspirin: Secondary | ICD-10-CM | POA: Diagnosis not present

## 2017-12-20 DIAGNOSIS — I10 Essential (primary) hypertension: Secondary | ICD-10-CM | POA: Diagnosis not present

## 2017-12-20 DIAGNOSIS — I252 Old myocardial infarction: Secondary | ICD-10-CM | POA: Diagnosis not present

## 2017-12-27 DIAGNOSIS — Z6827 Body mass index (BMI) 27.0-27.9, adult: Secondary | ICD-10-CM | POA: Diagnosis not present

## 2017-12-27 DIAGNOSIS — Z7189 Other specified counseling: Secondary | ICD-10-CM | POA: Diagnosis not present

## 2017-12-27 DIAGNOSIS — S60551D Superficial foreign body of right hand, subsequent encounter: Secondary | ICD-10-CM | POA: Diagnosis not present

## 2017-12-27 DIAGNOSIS — N400 Enlarged prostate without lower urinary tract symptoms: Secondary | ICD-10-CM | POA: Diagnosis not present

## 2018-02-03 ENCOUNTER — Ambulatory Visit: Payer: PPO | Admitting: Cardiology

## 2018-02-18 ENCOUNTER — Encounter: Payer: Self-pay | Admitting: Cardiology

## 2018-02-18 ENCOUNTER — Ambulatory Visit (INDEPENDENT_AMBULATORY_CARE_PROVIDER_SITE_OTHER): Payer: PPO | Admitting: Cardiology

## 2018-02-18 VITALS — BP 150/62 | HR 59 | Ht 70.0 in | Wt 178.0 lb

## 2018-02-18 DIAGNOSIS — I209 Angina pectoris, unspecified: Secondary | ICD-10-CM | POA: Diagnosis not present

## 2018-02-18 DIAGNOSIS — I255 Ischemic cardiomyopathy: Secondary | ICD-10-CM

## 2018-02-18 DIAGNOSIS — E785 Hyperlipidemia, unspecified: Secondary | ICD-10-CM

## 2018-02-18 DIAGNOSIS — I2510 Atherosclerotic heart disease of native coronary artery without angina pectoris: Secondary | ICD-10-CM | POA: Diagnosis not present

## 2018-02-18 NOTE — Addendum Note (Signed)
Addended by: Ashok Norris on: 02/18/2018 10:47 AM   Modules accepted: Orders

## 2018-02-18 NOTE — Patient Instructions (Signed)
Medication Instructions:  Your physician recommends that you continue on your current medications as directed. Please refer to the Current Medication list given to you today.   Labwork: None.  Testing/Procedures: Your physician has requested that you have a lexiscan myoview. For further information please visit [email or web address]. Please follow instruction sheet, as given.    Follow-Up: Your physician recommends that you schedule a follow-up appointment in: 1 month.   Any Other Special Instructions Will Be Listed Below (If Applicable).     If you need a refill on your cardiac medications before your next appointment, please call your pharmacy.   Cardiac Nuclear Scan A cardiac nuclear scan is a test that measures blood flow to the heart when a person is resting and when he or she is exercising. The test looks for problems such as:  Not enough blood reaching a portion of the heart.  The heart muscle not working normally.  You may need this test if:  You have heart disease.  You have had abnormal lab results.  You have had heart surgery or angioplasty.  You have chest pain.  You have shortness of breath.  In this test, a radioactive dye (tracer) is injected into your bloodstream. After the tracer has traveled to your heart, an imaging device is used to measure how much of the tracer is absorbed by or distributed to various areas of your heart. This procedure is usually done at a hospital and takes 2-4 hours. Tell a health care provider about:  Any allergies you have.  All medicines you are taking, including vitamins, herbs, eye drops, creams, and over-the-counter medicines.  Any problems you or family members have had with the use of anesthetic medicines.  Any blood disorders you have.  Any surgeries you have had.  Any medical conditions you have.  Whether you are pregnant or may be pregnant. What are the risks? Generally, this is a safe procedure. However,  problems may occur, including:  Serious chest pain and heart attack. This is only a risk if the stress portion of the test is done.  Rapid heartbeat.  Sensation of warmth in your chest. This usually passes quickly.  What happens before the procedure?  Ask your health care provider about changing or stopping your regular medicines. This is especially important if you are taking diabetes medicines or blood thinners.  Remove your jewelry on the day of the procedure. What happens during the procedure?  An IV tube will be inserted into one of your veins.  Your health care provider will inject a small amount of radioactive tracer through the tube.  You will wait for 20-40 minutes while the tracer travels through your bloodstream.  Your heart activity will be monitored with an electrocardiogram (ECG).  You will lie down on an exam table.  Images of your heart will be taken for about 15-20 minutes.  You may be asked to exercise on a treadmill or stationary bike. While you exercise, your heart's activity will be monitored with an ECG, and your blood pressure will be checked. If you are unable to exercise, you may be given a medicine to increase blood flow to parts of your heart.  When blood flow to your heart has peaked, a tracer will again be injected through the IV tube.  After 20-40 minutes, you will get back on the exam table and have more images taken of your heart.  When the procedure is over, your IV tube will be removed. The procedure may vary  among health care providers and hospitals. Depending on the type of tracer used, scans may need to be repeated 3-4 hours later. What happens after the procedure?  Unless your health care provider tells you otherwise, you may return to your normal schedule, including diet, activities, and medicines.  Unless your health care provider tells you otherwise, you may increase your fluid intake. This will help flush the contrast dye from your  body. Drink enough fluid to keep your urine clear or pale yellow.  It is up to you to get your test results. Ask your health care provider, or the department that is doing the test, when your results will be ready. Summary  A cardiac nuclear scan measures the blood flow to the heart when a person is resting and when he or she is exercising.  You may need this test if you are at risk for heart disease.  Tell your health care provider if you are pregnant.  Unless your health care provider tells you otherwise, increase your fluid intake. This will help flush the contrast dye from your body. Drink enough fluid to keep your urine clear or pale yellow. This information is not intended to replace advice given to you by your health care provider. Make sure you discuss any questions you have with your health care provider. Document Released: 06/22/2004 Document Revised: 05/30/2016 Document Reviewed: 05/06/2013 Elsevier Interactive Patient Education  2017 [employer].

## 2018-02-18 NOTE — Addendum Note (Signed)
Addended by: Linna Hoff R on: 02/18/2018 11:40 AM   Modules accepted: Orders

## 2018-02-18 NOTE — Progress Notes (Signed)
Cardiology Office Note:    Date:  02/18/2018   ID:  Shane Massey, DOB 1941/05/04, MRN [id number]  PCP:  Maryella Shivers, MD  Cardiologist:  Jenne Campus, MD    Referring MD: Maryella Shivers, MD   Chief Complaint  Patient presents with  . Follow-up  Doing fair  History of Present Illness:    Shane Massey is a 77 y.o. male with history of coronary artery disease.  Comes today to my office for follow-up he complains of having chest pain.  Pain happening different situations one time he had to take nitroglycerin usually not related to exercise.  Overall he can of slows down.  He is being active all his life always doing something left behind however lately he started experiencing some issue tiredness fatigue he thinks this is simply related to his aging however I brought this issue of chest pain that became somewhat more frequent obviously concerning.  He graded the pain as up to 4 in scale up to 10.  He denies having any shortness of breath associated with this sensation pain can last up to 20 minutes last episode of pain he got was 2 weeks ago he was just doing his usual staff in the garden when he developed this sensation.  Past Medical History:  Diagnosis Date  . GERD (gastroesophageal reflux disease)   . Hypothyroidism   . OSA (obstructive sleep apnea)     Past Surgical History:  Procedure Laterality Date  . heart stent Left 09/2015    Current Medications: Current Meds  Medication Sig  . aspirin 81 MG tablet Take 81 mg by mouth daily.  Marland Kitchen atorvastatin (LIPITOR) 80 MG tablet Take 1 tablet (80 mg total) by mouth daily.  . calcium carbonate (CALCIUM 600) 600 MG TABS tablet Take 1 tablet by mouth daily.  . clopidogrel (PLAVIX) 75 MG tablet Take 1 tablet (75 mg total) by mouth daily.  . Coenzyme Q-10 200 MG CAPS Take 200 mg by mouth daily.  . isosorbide mononitrate (IMDUR) 30 MG 24 hr tablet Take 1 tablet (30 mg total) by mouth daily.  Marland Kitchen levothyroxine (SYNTHROID,  LEVOTHROID) 50 MCG tablet Take 50 mcg by mouth daily before breakfast.  . Multiple Vitamin (MULTIVITAMIN) tablet Take 1 tablet by mouth daily.  . pantoprazole (PROTONIX) 40 MG tablet Take 40 mg by mouth daily.  . pramipexole (MIRAPEX) 1.5 MG tablet Take 1.5 mg by mouth at bedtime.      Allergies:   Patient has no known allergies.   Social History   Socioeconomic History  . Marital status: Married    Spouse name: Not on file  . Number of children: Not on file  . Years of education: Not on file  . Highest education level: Not on file  Occupational History  . Not on file  Social Needs  . Financial resource strain: Not on file  . Food insecurity:    Worry: Not on file    Inability: Not on file  . Transportation needs:    Medical: Not on file    Non-medical: Not on file  Tobacco Use  . Smoking status: Former Smoker    Packs/day: 0.50    Years: 4.00    Pack years: 2.00    Types: Cigarettes    Last attempt to quit: 06/11/1958    Years since quitting: 59.7  . Smokeless tobacco: Former Systems developer    Types: [place] date: 12/01/1958  Substance and Sexual Activity  . Alcohol  use: No    Alcohol/week: 0.0 standard drinks  . Drug use: No  . Sexual activity: Not on file  Lifestyle  . Physical activity:    Days per week: Not on file    Minutes per session: Not on file  . Stress: Not on file  Relationships  . Social connections:    Talks on phone: Not on file    Gets together: Not on file    Attends religious service: Not on file    Active member of club or organization: Not on file    Attends meetings of clubs or organizations: Not on file    Relationship status: Not on file  Other Topics Concern  . Not on file  Social History Narrative  . Not on file     Family History: The patient's family history includes Asthma in his brother and maternal grandmother; Heart attack in his brother; Heart disease in his brother; Stroke in his father. ROS:   Please see the history of  present illness.    All 14 point review of systems negative except as described per history of present illness  EKGs/Labs/Other Studies Reviewed:    EKG showed sinus bradycardia with occasional PVCs.  Q waves V1 V2 raising suspicion for anterior septal wall MI.  Nonspecific ST segment changes  Recent Labs: No results found for requested labs within last 8760 hours.  Recent Lipid Panel No results found for: CHOL, TRIG, HDL, CHOLHDL, VLDL, LDLCALC, LDLDIRECT  Physical Exam:    VS:  BP (!) 150/62   Pulse (!) 59   Ht 5\' 10"  (1.778 m)   Wt 178 lb (80.7 kg)   SpO2 97%   BMI 25.54 kg/m     Wt Readings from Last 3 Encounters:  02/18/18 178 lb (80.7 kg)  06/14/17 178 lb (80.7 kg)  04/08/17 175 lb (79.4 kg)     GEN:  Well nourished, well developed in no acute distress HEENT: Normal NECK: No JVD; No carotid bruits LYMPHATICS: No lymphadenopathy CARDIAC: RRR, no murmurs, no rubs, no gallops RESPIRATORY:  Clear to auscultation without rales, wheezing or rhonchi  ABDOMEN: Soft, non-tender, non-distended MUSCULOSKELETAL:  No edema; No deformity  SKIN: Warm and dry LOWER EXTREMITIES: no swelling NEUROLOGIC:  Alert and oriented x 3 PSYCHIATRIC:  Normal affect   ASSESSMENT:    1. Angina pectoris ([place])   2. Coronary artery disease involving native heart without angina pectoris, unspecified vessel or lesion type   3. Ischemic cardiomyopathy   4. Dyslipidemia    PLAN:    In order of problems listed above:  1. Angina pectoris.  I offer him ranolazine he declined he said he gets rare episode of pain before he does not want to take any extra medications.  I will schedule him to have a stress test.  It will be done in form of exercise Cardiolite. 2. Coronary artery disease; plan as outlined above 3. Ischemic cardiomyopathy and appropriate medications which I will continue. 4. Dyslipidemia he is on high intensity statin which I will continue.  He was told if he developed pain which is  worse than before and not relieved by nitroglycerin he needs to go to the emergency room.   Medication Adjustments/Labs and Tests Ordered: Current medicines are reviewed at length with the patient today.  Concerns regarding medicines are outlined above.  No orders of the defined types were placed in this encounter.  Medication changes: No orders of the defined types were placed in this encounter.  Signed, Park Liter, MD, [facility] 02/18/2018 10:19 AM    [place]

## 2018-03-05 ENCOUNTER — Telehealth (HOSPITAL_COMMUNITY): Payer: Self-pay

## 2018-03-05 NOTE — Telephone Encounter (Signed)
Spoke with the pt's wife. She took all the instructions and said that she would give them to her husband. She stated that she understood and would call back with any questions.

## 2018-03-11 ENCOUNTER — Ambulatory Visit (INDEPENDENT_AMBULATORY_CARE_PROVIDER_SITE_OTHER): Payer: PPO

## 2018-03-11 VITALS — Ht 70.0 in | Wt 178.0 lb

## 2018-03-11 DIAGNOSIS — I209 Angina pectoris, unspecified: Secondary | ICD-10-CM

## 2018-03-11 DIAGNOSIS — I255 Ischemic cardiomyopathy: Secondary | ICD-10-CM | POA: Diagnosis not present

## 2018-03-11 DIAGNOSIS — R0609 Other forms of dyspnea: Secondary | ICD-10-CM

## 2018-03-11 DIAGNOSIS — R0600 Dyspnea, unspecified: Secondary | ICD-10-CM

## 2018-03-11 MED ORDER — TECHNETIUM TC 99M TETROFOSMIN IV KIT
10.6000 | PACK | Freq: Once | INTRAVENOUS | Status: AC | PRN
  Administered 2018-03-11: 10.6 via INTRAVENOUS

## 2018-03-11 MED ORDER — TECHNETIUM TC 99M TETROFOSMIN IV KIT
30.8000 | PACK | Freq: Once | INTRAVENOUS | Status: AC | PRN
  Administered 2018-03-11: 30.8 via INTRAVENOUS

## 2018-03-12 LAB — MYOCARDIAL PERFUSION IMAGING
CHL CUP MPHR: 145 {beats}/min
CHL CUP NUCLEAR SDS: 0
CHL CUP NUCLEAR SRS: 0
CHL CUP NUCLEAR SSS: 0
CHL CUP RESTING HR STRESS: 56 {beats}/min
CSEPEW: 7.9 METS
CSEPPHR: 126 {beats}/min
Exercise duration (min): 7 min
Exercise duration (sec): 46 s
LV dias vol: 155 mL (ref 62–150)
LV sys vol: 84 mL
NUC STRESS TID: 1.06
Percent HR: 86 %

## 2018-03-18 DIAGNOSIS — L570 Actinic keratosis: Secondary | ICD-10-CM | POA: Diagnosis not present

## 2018-03-18 DIAGNOSIS — L578 Other skin changes due to chronic exposure to nonionizing radiation: Secondary | ICD-10-CM | POA: Diagnosis not present

## 2018-03-18 DIAGNOSIS — C44622 Squamous cell carcinoma of skin of right upper limb, including shoulder: Secondary | ICD-10-CM | POA: Diagnosis not present

## 2018-03-18 DIAGNOSIS — D485 Neoplasm of uncertain behavior of skin: Secondary | ICD-10-CM | POA: Diagnosis not present

## 2018-03-18 DIAGNOSIS — L821 Other seborrheic keratosis: Secondary | ICD-10-CM | POA: Diagnosis not present

## 2018-04-09 ENCOUNTER — Ambulatory Visit (INDEPENDENT_AMBULATORY_CARE_PROVIDER_SITE_OTHER): Payer: PPO | Admitting: Cardiology

## 2018-04-09 ENCOUNTER — Encounter: Payer: Self-pay | Admitting: Cardiology

## 2018-04-09 ENCOUNTER — Telehealth: Payer: Self-pay | Admitting: Cardiology

## 2018-04-09 VITALS — BP 144/72 | HR 65 | Ht 70.0 in | Wt 177.2 lb

## 2018-04-09 DIAGNOSIS — R0609 Other forms of dyspnea: Secondary | ICD-10-CM | POA: Diagnosis not present

## 2018-04-09 DIAGNOSIS — I255 Ischemic cardiomyopathy: Secondary | ICD-10-CM

## 2018-04-09 DIAGNOSIS — I2510 Atherosclerotic heart disease of native coronary artery without angina pectoris: Secondary | ICD-10-CM

## 2018-04-09 DIAGNOSIS — E785 Hyperlipidemia, unspecified: Secondary | ICD-10-CM | POA: Diagnosis not present

## 2018-04-09 DIAGNOSIS — R0600 Dyspnea, unspecified: Secondary | ICD-10-CM

## 2018-04-09 DIAGNOSIS — I209 Angina pectoris, unspecified: Secondary | ICD-10-CM | POA: Diagnosis not present

## 2018-04-09 MED ORDER — RANOLAZINE ER 500 MG PO TB12: 500.0000 mg | ORAL_TABLET | Freq: Two times a day (BID) | ORAL | 1 refills | Status: DC

## 2018-04-09 NOTE — Patient Instructions (Signed)
Medication Instructions:  Your physician has recommended you make the following change in your medication:   START: Ranexa 500 mg twice daily   If you need a refill on your cardiac medications before your next appointment, please call your pharmacy.   Lab work: None.   If you have labs (blood work) drawn today and your tests are completely normal, you will receive your results only by: Marland Kitchen MyChart Message (if you have MyChart) OR . A paper copy in the mail If you have any lab test that is abnormal or we need to change your treatment, we will call you to review the results.  Testing/Procedures: None.    Follow-Up: At [facility], you and your health needs are our priority.  As part of our continuing mission to provide you with exceptional heart care, we have created designated Provider Care Teams.  These Care Teams include your primary Cardiologist (physician) and Advanced Practice Providers (APPs -  Physician Assistants and Nurse Practitioners) who all work together to provide you with the care you need, when you need it. You will need a follow up appointment in 3 weeks.  Please call our office 2 months in advance to schedule this appointment.  You may see No primary care provider on file. or another member of our [employer] Provider Team in [place]: Shirlee More, MD . Jyl Heinz, MD  Any Other Special Instructions Will Be Listed Below (If Applicable).  Ranolazine tablets, extended release What is this medicine? RANOLAZINE (ra NOE la zeen) is a heart medicine. It is used to treat chronic chest pain (angina). This medicine must be taken regularly. It will not relieve an acute episode of chest pain. This medicine may be used for other purposes; ask your health care provider or pharmacist if you have questions. COMMON BRAND NAME(S): Ranexa What should I tell my health care provider before I take this medicine? They need to know if you have any of these conditions: -heart  disease -irregular heartbeat -kidney disease -liver disease -low levels of potassium or magnesium in the blood -an unusual or allergic reaction to ranolazine, other medicines, foods, dyes, or preservatives -pregnant or trying to get pregnant -breast-feeding How should I use this medicine? Take this medicine by mouth with a glass of water. Follow the directions on the prescription label. Do not cut, crush, or chew this medicine. Take with or without food. Do not take this medication with grapefruit juice. Take your doses at regular intervals. Do not take your medicine more often then directed. Talk to your pediatrician regarding the use of this medicine in children. Special care may be needed. Overdosage: If you think you have taken too much of this medicine contact a poison control center or emergency room at once. NOTE: This medicine is only for you. Do not share this medicine with others. What if I miss a dose? If you miss a dose, take it as soon as you can. If it is almost time for your next dose, take only that dose. Do not take double or extra doses. What may interact with this medicine? Do not take this medicine with any of the following medications: -antivirals for HIV or AIDS -cerivastatin -certain antibiotics like chloramphenicol, clarithromycin, dalfopristin; quinupristin, isoniazid, rifabutin, rifampin, rifapentine -certain medicines used for cancer like imatinib, nilotinib -certain medicines for fungal infections like fluconazole, itraconazole, ketoconazole, posaconazole, voriconazole -certain medicines for irregular heart beat like dofetilide, dronedarone -certain medicines for seizures like carbamazepine, fosphenytoin, oxcarbazepine, phenobarbital, phenytoin -cisapride -conivaptan -cyclosporine -  grapefruit or grapefruit juice -lumacaftor; ivacaftor -nefazodone -pimozide -quinacrine -St John's wort -thioridazine -ziprasidone This medicine may also interact with the  following medications: -alfuzosin -certain medicines for depression, anxiety, or psychotic disturbances like bupropion, citalopram, fluoxetine, fluphenazine, paroxetine, perphenazine, risperidone, sertraline, trifluoperazine -certain medicines for cholesterol like atorvastatin, lovastatin, simvastatin -certain medicines for stomach problems like octreotide, palonosetron, prochlorperazine -eplerenone -ergot alkaloids like dihydroergotamine, ergonovine, ergotamine, methylergonovine -metformin -nicardipine -other medicines that prolong the QT interval (cause an abnormal heart rhythm) -sirolimus -tacrolimus This list may not describe all possible interactions. Give your health care provider a list of all the medicines, herbs, non-prescription drugs, or dietary supplements you use. Also tell them if you smoke, drink alcohol, or use illegal drugs. Some items may interact with your medicine. What should I watch for while using this medicine? Visit your doctor for regular check ups. Tell your doctor or healthcare professional if your symptoms do not start to get better or if they get worse. This medicine will not relieve an acute attack of angina or chest pain. This medicine can change your heart rhythm. Your health care provider may check your heart rhythm by ordering an electrocardiogram (ECG) while you are taking this medicine. You may get drowsy or dizzy. Do not drive, use machinery, or do anything that needs mental alertness until you know how this medicine affects you. Do not stand or sit up quickly, especially if you are an older patient. This reduces the risk of dizzy or fainting spells. Alcohol may interfere with the effect of this medicine. Avoid alcoholic drinks. If you are scheduled for any medical or dental procedure, tell your healthcare provider that you are taking this medicine. This medicine can interact with other medicines used during surgery. What side effects may I notice from  receiving this medicine? Side effects that you should report to your doctor or health care professional as soon as possible: -allergic reactions like skin rash, itching or hives, swelling of the face, lips, or tongue -breathing problems -changes in vision -fast, irregular or pounding heartbeat -feeling faint or lightheaded, falls -low or high blood pressure -numbness or tingling feelings -ringing in the ears -tremor or shakiness -slow heartbeat (fewer than 50 beats per minute) -swelling of the legs or feet Side effects that usually do not require medical attention (report to your doctor or health care professional if they continue or are bothersome): -constipation -drowsy -dry mouth -headache -nausea or vomiting -stomach upset This list may not describe all possible side effects. Call your doctor for medical advice about side effects. You may report side effects to FDA at 1-800-FDA-1088. Where should I keep my medicine? Keep out of the reach of children. Store at room temperature between 15 and 30 degrees C (59 and 86 degrees F). Throw away any unused medicine after the expiration date. NOTE: This sheet is a summary. It may not cover all possible information. If you have questions about this medicine, talk to your doctor, pharmacist, or health care provider.  2018 Elsevier/Gold Standard (2015-06-30 12:24:15)

## 2018-04-09 NOTE — Progress Notes (Signed)
Cardiology Office Note:    Date:  04/09/2018   ID:  CARLA WHILDEN, DOB 20-Apr-1941, MRN [id number]  PCP:  Maryella Shivers, MD  Cardiologist:  Jenne Campus, MD    Referring MD: Maryella Shivers, MD   Chief Complaint  Patient presents with  . Follow up on Lexi  I am still having problems  History of Present Illness:    Shane Massey is a 77 y.o. male with coronary artery disease.  I see him about a month ago he started complaining of having some chest pains some of this was exertional he took nitroglycerin with good relief.  We decided to do a stress test stress test showed no evidence of ischemia however he still complaining of having some pain.  Last time I wanted to give him some medication he refused he said he takes too many medication he did not want to do it however today is ready to take some medication.  We talked about options in this situation about 3 weeks ago he also had of having cold he still recovering from it.  I will talk about potentially doing cardiac catheterization or starting some extra medication he wants to try medications first therefore I will give him ranolazine 5 mg twice daily if that will not help done cardiac catheterization need to be strongly consider even considering the fact that his stress test was normal.  Past Medical History:  Diagnosis Date  . GERD (gastroesophageal reflux disease)   . Hypothyroidism   . OSA (obstructive sleep apnea)     Past Surgical History:  Procedure Laterality Date  . heart stent Left 09/2015    Current Medications: Current Meds  Medication Sig  . aspirin 81 MG tablet Take 81 mg by mouth daily.  Marland Kitchen atorvastatin (LIPITOR) 80 MG tablet Take 1 tablet (80 mg total) by mouth daily.  . calcium carbonate (CALCIUM 600) 600 MG TABS tablet Take 1 tablet by mouth daily.  . clopidogrel (PLAVIX) 75 MG tablet Take 1 tablet (75 mg total) by mouth daily.  . Coenzyme Q-10 200 MG CAPS Take 200 mg by mouth daily.  . isosorbide  mononitrate (IMDUR) 30 MG 24 hr tablet Take 1 tablet (30 mg total) by mouth daily.  Marland Kitchen levothyroxine (SYNTHROID, LEVOTHROID) 50 MCG tablet Take 50 mcg by mouth daily before breakfast.  . Multiple Vitamin (MULTIVITAMIN) tablet Take 1 tablet by mouth daily.  . pantoprazole (PROTONIX) 40 MG tablet Take 40 mg by mouth daily.  . pramipexole (MIRAPEX) 1.5 MG tablet Take 1.5 mg by mouth at bedtime.      Allergies:   Patient has no known allergies.   Social History   Socioeconomic History  . Marital status: Married    Spouse name: Not on file  . Number of children: Not on file  . Years of education: Not on file  . Highest education level: Not on file  Occupational History  . Not on file  Social Needs  . Financial resource strain: Not on file  . Food insecurity:    Worry: Not on file    Inability: Not on file  . Transportation needs:    Medical: Not on file    Non-medical: Not on file  Tobacco Use  . Smoking status: Former Smoker    Packs/day: 0.50    Years: 4.00    Pack years: 2.00    Types: Cigarettes    Last attempt to quit: 06/11/1958    Years since quitting: 59.8  . Smokeless  tobacco: Former Systems developer    Types: Chew    Quit date: 12/01/1958  Substance and Sexual Activity  . Alcohol use: No    Alcohol/week: 0.0 standard drinks  . Drug use: No  . Sexual activity: Not on file  Lifestyle  . Physical activity:    Days per week: Not on file    Minutes per session: Not on file  . Stress: Not on file  Relationships  . Social connections:    Talks on phone: Not on file    Gets together: Not on file    Attends religious service: Not on file    Active member of club or organization: Not on file    Attends meetings of clubs or organizations: Not on file    Relationship status: Not on file  Other Topics Concern  . Not on file  Social History Narrative  . Not on file     Family History: The patient's family history includes Asthma in his brother and maternal grandmother; Heart  attack in his brother; Heart disease in his brother; Stroke in his father. ROS:   Please see the history of present illness.    All 14 point review of systems negative except as described per history of present illness  EKGs/Labs/Other Studies Reviewed:      Recent Labs: No results found for requested labs within last 8760 hours.  Recent Lipid Panel No results found for: CHOL, TRIG, HDL, CHOLHDL, VLDL, LDLCALC, LDLDIRECT  Physical Exam:    VS:  BP (!) 144/72   Pulse 65   Ht 5\' 10"  (1.778 m)   Wt 177 lb 3.2 oz (80.4 kg)   SpO2 96%   BMI 25.43 kg/m     Wt Readings from Last 3 Encounters:  04/09/18 177 lb 3.2 oz (80.4 kg)  03/11/18 178 lb (80.7 kg)  02/18/18 178 lb (80.7 kg)     GEN:  Well nourished, well developed in no acute distress HEENT: Normal NECK: No JVD; No carotid bruits LYMPHATICS: No lymphadenopathy CARDIAC: RRR, no murmurs, no rubs, no gallops RESPIRATORY:  Clear to auscultation without rales, wheezing or rhonchi  ABDOMEN: Soft, non-tender, non-distended MUSCULOSKELETAL:  No edema; No deformity  SKIN: Warm and dry LOWER EXTREMITIES: no swelling NEUROLOGIC:  Alert and oriented x 3 PSYCHIATRIC:  Normal affect   ASSESSMENT:    1. Angina pectoris ([place])   2. Coronary artery disease involving native heart without angina pectoris, unspecified vessel or lesion type   3. Ischemic cardiomyopathy   4. Dyslipidemia   5. Dyspnea on exertion    PLAN:    In order of problems listed above:  1. Angina pectoris we will initiate ranolazine 500 mg twice daily rest of the medication will be the same he takes nitroglycerin on as-needed basis. 2. Coronary artery disease last stress test normal however symptoms are worrisome plan as outlined above. 3. Ischemic cardiomyopathy on appropriate medication which I will continue. 4. Dyslipidemia on high intensity statin with latest LDL of 73 and HDL 54   See him back with 2 to 3 weeks or sooner if he has a problem.  I told him  that he need to let us know if he started having more problems.   Medication Adjustments/Labs and Tests Ordered: Current medicines are reviewed at length with the patient today.  Concerns regarding medicines are outlined above.  No orders of the defined types were placed in this encounter.  Medication changes: No orders of the defined types were placed in  this encounter.   Signed, Park Liter, MD, [facility] 04/09/2018 9:24 AM    [place]

## 2018-04-09 NOTE — Telephone Encounter (Signed)
Per Dr. Agustin Cree this is okay. Wife informed, no further questions

## 2018-04-09 NOTE — Telephone Encounter (Signed)
Patient's wife called back because Dr. Agustin Cree started patient on Ranexa and the drug information states not to take it if he is takig a statin drug. Please call wide cause hse is concerned because he has taken 1/2 of a pill already.

## 2018-04-10 DIAGNOSIS — Z23 Encounter for immunization: Secondary | ICD-10-CM | POA: Diagnosis not present

## 2018-04-30 ENCOUNTER — Ambulatory Visit (INDEPENDENT_AMBULATORY_CARE_PROVIDER_SITE_OTHER): Payer: PPO | Admitting: Cardiology

## 2018-04-30 ENCOUNTER — Encounter: Payer: Self-pay | Admitting: Cardiology

## 2018-04-30 VITALS — BP 132/74 | HR 58 | Ht 70.0 in | Wt 179.8 lb

## 2018-04-30 DIAGNOSIS — R42 Dizziness and giddiness: Secondary | ICD-10-CM | POA: Diagnosis not present

## 2018-04-30 DIAGNOSIS — I2510 Atherosclerotic heart disease of native coronary artery without angina pectoris: Secondary | ICD-10-CM

## 2018-04-30 DIAGNOSIS — I255 Ischemic cardiomyopathy: Secondary | ICD-10-CM | POA: Diagnosis not present

## 2018-04-30 NOTE — Progress Notes (Signed)
Cardiology Office Note:    Date:  04/30/2018   ID:  Shane Massey, DOB Jul 17, 1940, MRN [id number]  PCP:  Maryella Shivers, MD  Cardiologist:  Jenne Campus, MD    Referring MD: Maryella Shivers, MD   Chief Complaint  Patient presents with  . 3 week follow up  After episode of dizziness as well as chest pain  History of Present Illness:    Shane Massey is a 77 y.o. 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  I am worried that he may have some significant slowing down heart rate which is responsible for his symptomatology and dizziness.  Past Medical History:  Diagnosis Date  . GERD (gastroesophageal reflux disease)   . Hypothyroidism   . OSA (obstructive sleep apnea)     Past Surgical History:  Procedure Laterality Date  . heart stent Left 09/2015    Current Medications: Current Meds  Medication Sig  . aspirin 81 MG tablet Take 81 mg by mouth daily.  Marland Kitchen atorvastatin (LIPITOR) 80 MG tablet Take 1 tablet (80 mg total) by mouth daily.  . calcium carbonate (CALCIUM 600) 600 MG TABS tablet Take 1 tablet by mouth daily.  . clopidogrel (PLAVIX) 75 MG tablet Take 1 tablet (75 mg total) by mouth daily.  . Coenzyme Q-10 200 MG CAPS Take 200 mg by mouth daily.  . isosorbide mononitrate (IMDUR) 30 MG 24 hr tablet Take 1  tablet (30 mg total) by mouth daily.  Marland Kitchen levothyroxine (SYNTHROID, LEVOTHROID) 50 MCG tablet Take 50 mcg by mouth daily before breakfast.  . Multiple Vitamin (MULTIVITAMIN) tablet Take 1 tablet by mouth daily.  . pantoprazole (PROTONIX) 40 MG tablet Take 40 mg by mouth daily.  . pramipexole (MIRAPEX) 1.5 MG tablet Take 1.5 mg by mouth at bedtime.   . ranolazine (RANEXA) 500 MG 12 hr tablet Take 1 tablet (500 mg total) by mouth 2 (two) times daily.     Allergies:   Patient has no known allergies.   Social History   Socioeconomic History  . Marital status: Married    Spouse name: Not on file  . Number of children: Not on file  . Years of education: Not on file  . Highest education level: Not on file  Occupational History  . Not on file  Social Needs  . Financial resource strain: Not on file  . Food insecurity:    Worry: Not on file    Inability: Not on file  . Transportation needs:    Medical: Not on file    Non-medical: Not on file  Tobacco Use  . Smoking status: Former Smoker    Packs/day: 0.50    Years: 4.00    Pack years: 2.00    Types: Cigarettes    Last attempt  to quit: 06/11/1958    Years since quitting: 59.9  . Smokeless tobacco: Former Systems developer    Types: [place] date: 12/01/1958  Substance and Sexual Activity  . Alcohol use: No    Alcohol/week: 0.0 standard drinks  . Drug use: No  . Sexual activity: Not on file  Lifestyle  . Physical activity:    Days per week: Not on file    Minutes per session: Not on file  . Stress: Not on file  Relationships  . Social connections:    Talks on phone: Not on file    Gets together: Not on file    Attends religious service: Not on file    Active member of club or organization: Not on file    Attends meetings of clubs or organizations: Not on file    Relationship status: Not on file  Other Topics Concern  . Not on file  Social History Narrative  . Not on file     Family History: The patient's family history includes  Asthma in his brother and maternal grandmother; Heart attack in his brother; Heart disease in his brother; Stroke in his father. ROS:   Please see the history of present illness.    All 14 point review of systems negative except as described per history of present illness  EKGs/Labs/Other Studies Reviewed:      Recent Labs: No results found for requested labs within last 8760 hours.  Recent Lipid Panel No results found for: CHOL, TRIG, HDL, CHOLHDL, VLDL, LDLCALC, LDLDIRECT  Physical Exam:    VS:  BP 132/74   Pulse (!) 58   Ht 5\' 10"  (1.778 m)   Wt 179 lb 12.8 oz (81.6 kg)   SpO2 97%   BMI 25.80 kg/m     Wt Readings from Last 3 Encounters:  04/30/18 179 lb 12.8 oz (81.6 kg)  04/09/18 177 lb 3.2 oz (80.4 kg)  03/11/18 178 lb (80.7 kg)     GEN:  Well nourished, well developed in no acute distress HEENT: Normal NECK: No JVD; No carotid bruits LYMPHATICS: No lymphadenopathy CARDIAC: RRR, no murmurs, no rubs, no gallops RESPIRATORY:  Clear to auscultation without rales, wheezing or rhonchi  ABDOMEN: Soft, non-tender, non-distended MUSCULOSKELETAL:  No edema; No deformity  SKIN: Warm and dry LOWER EXTREMITIES: no swelling NEUROLOGIC:  Alert and oriented x 3 PSYCHIATRIC:  Normal affect   ASSESSMENT:    1. Coronary artery disease involving native heart without angina pectoris, unspecified vessel or lesion type   2. Dizziness   3. Ischemic cardiomyopathy    PLAN:    In order of problems listed above:  1. Coronary artery disease and appropriate medication which I will continue recent stress test negative but symptoms somewhat concerning. 2. Dizziness will put 30 days telemetry monitor so we can monitor him carefully and see if he get any significant arrhythmia 3. Ischemic cardia myopathy and appropriate medications which I will continue.   Medication Adjustments/Labs and Tests Ordered: Current medicines are reviewed at length with the patient today.  Concerns  regarding medicines are outlined above.  No orders of the defined types were placed in this encounter.  Medication changes: No orders of the defined types were placed in this encounter.   Signed, Park Liter, MD, [facility] 04/30/2018 11:23 AM    [place]

## 2018-04-30 NOTE — Addendum Note (Signed)
Addended by: Ashok Norris on: 04/30/2018 11:47 AM   Modules accepted: Orders

## 2018-04-30 NOTE — Addendum Note (Signed)
Addended by: Ashok Norris on: 04/30/2018 01:46 PM   Modules accepted: Orders

## 2018-04-30 NOTE — Patient Instructions (Signed)
Medication Instructions:  Your physician recommends that you continue on your current medications as directed. Please refer to the Current Medication list given to you today.  If you need a refill on your cardiac medications before your next appointment, please call your pharmacy.   Lab work: None.  If you have labs (blood work) drawn today and your tests are completely normal, you will receive your results only by: Marland Kitchen MyChart Message (if you have MyChart) OR . A paper copy in the mail If you have any lab test that is abnormal or we need to change your treatment, we will call you to review the results.  Testing/Procedures: Your physician has recommended that you wear an event monitor. Event monitors are medical devices that record the heart's electrical activity. Doctors most often [place] these monitors to diagnose arrhythmias. Arrhythmias are problems with the speed or rhythm of the heartbeat. The monitor is a small, portable device. You can wear one while you do your normal daily activities. This is usually used to diagnose what is causing palpitations/syncope (passing out). Wear for 30 days.     Follow-Up: At [facility], you and your health needs are our priority.  As part of our continuing mission to provide you with exceptional heart care, we have created designated Provider Care Teams.  These Care Teams include your primary Cardiologist (physician) and Advanced Practice Providers (APPs -  Physician Assistants and Nurse Practitioners) who all work together to provide you with the care you need, when you need it. You will need a follow up appointment in 6 weeks.  Please call our office 2 months in advance to schedule this appointment.  You may see No primary care provider on file. or another member of our [employer] Provider Team in [place]: Shirlee More, MD . Jyl Heinz, MD  Any Other Special Instructions Will Be Listed Below (If Applicable).   Cardiac Event Monitoring A  cardiac event monitor is a small recording device that is used to detect abnormal heart rhythms (arrhythmias). The monitor is used to record your heart rhythm when you have symptoms, such as:  Fast heartbeats (palpitations), such as heart racing or fluttering.  Dizziness.  Fainting or light-headedness.  Unexplained weakness.  Some monitors are wired to electrodes placed on your chest. Electrodes are flat, sticky disks that attach to your skin. Other monitors may be hand-held or worn on the wrist. The monitor can be worn for up to 30 days. If the monitor is attached to your chest, a technician will prepare your chest for the electrode placement and show you how to work the monitor. Take time to practice using the monitor before you leave the office. Make sure you understand how to send the information from the monitor to your health care provider. In some cases, you may need to use a landline telephone instead of a cell phone. What are the risks? Generally, this device is safe to use, but it possible that the skin under the electrodes will become irritated. How to use your cardiac event monitor  Wear your monitor at all times, except when you are in water: ? Do not let the monitor get wet. ? Take the monitor off when you bathe. Do not swim or use a hot tub with it on.  Keep your skin clean. Do not put body lotion or moisturizer on your chest.  Change the electrodes as told by your health care provider or any time they stop sticking to your skin. You may  need to use medical tape to keep them on.  Try to put the electrodes in slightly different places on your chest to help prevent skin irritation. They must remain in the area under your left breast and in the upper right section of your chest.  Make sure the monitor is safely clipped to your clothing or in a location close to your body that your health care provider recommends.  Press the button to record as soon as you feel heart-related  symptoms, such as: ? Dizziness. ? Weakness. ? Light-headedness. ? Palpitations. ? Thumping or pounding in your chest. ? Shortness of breath. ? Unexplained weakness.  Keep a diary of your activities, such as walking, doing chores, and taking medicine. It is very important to note what you were doing when you pushed the button to record your symptoms. This will help your health care provider determine what might be contributing to your symptoms.  Send the recorded information as recommended by your health care provider. It may take some time for your health care provider to process the results.  Change the batteries as told by your health care provider.  Keep electronic devices away from your monitor. This includes: ? Tablets. ? MP3 players. ? Cell phones.  While wearing your monitor you should avoid: ? Electric blankets. ? Armed forces operational officer. ? Electric toothbrushes. ? Microwave ovens. ? Magnets. ? Metal detectors. Get help right away if:  You have chest pain.  You have extreme difficulty breathing or shortness of breath.  You develop a very fast heartbeat that persists.  You develop dizziness that does not go away.  You faint or constantly feel like you are about to faint. Summary  A cardiac event monitor is a small recording device that is used to help detect abnormal heart rhythms (arrhythmias).  The monitor is used to record your heart rhythm when you have heart-related symptoms.  Make sure you understand how to send the information from the monitor to your health care provider.  It is important to press the button on the monitor when you have any heart-related symptoms.  Keep a diary of your activities, such as walking, doing chores, and taking medicine. It is very important to note what you were doing when you pushed the button to record your symptoms. This will help your health care provider learn what might be causing your symptoms. This information is not  intended to replace advice given to you by your health care provider. Make sure you discuss any questions you have with your health care provider. Document Released: 03/06/2008 Document Revised: 05/12/2016 Document Reviewed: 05/12/2016 Elsevier Interactive Patient Education  2017 [employer].

## 2018-05-05 DIAGNOSIS — R7303 Prediabetes: Secondary | ICD-10-CM | POA: Diagnosis not present

## 2018-05-05 DIAGNOSIS — E785 Hyperlipidemia, unspecified: Secondary | ICD-10-CM | POA: Diagnosis not present

## 2018-05-05 DIAGNOSIS — E039 Hypothyroidism, unspecified: Secondary | ICD-10-CM | POA: Diagnosis not present

## 2018-05-06 DIAGNOSIS — M9903 Segmental and somatic dysfunction of lumbar region: Secondary | ICD-10-CM | POA: Diagnosis not present

## 2018-05-06 DIAGNOSIS — M62830 Muscle spasm of back: Secondary | ICD-10-CM | POA: Diagnosis not present

## 2018-05-06 DIAGNOSIS — M9901 Segmental and somatic dysfunction of cervical region: Secondary | ICD-10-CM | POA: Diagnosis not present

## 2018-05-06 DIAGNOSIS — M5442 Lumbago with sciatica, left side: Secondary | ICD-10-CM | POA: Diagnosis not present

## 2018-05-06 DIAGNOSIS — M5137 Other intervertebral disc degeneration, lumbosacral region: Secondary | ICD-10-CM | POA: Diagnosis not present

## 2018-05-06 DIAGNOSIS — M5413 Radiculopathy, cervicothoracic region: Secondary | ICD-10-CM | POA: Diagnosis not present

## 2018-05-06 DIAGNOSIS — M5136 Other intervertebral disc degeneration, lumbar region: Secondary | ICD-10-CM | POA: Diagnosis not present

## 2018-05-06 DIAGNOSIS — M9904 Segmental and somatic dysfunction of sacral region: Secondary | ICD-10-CM | POA: Diagnosis not present

## 2018-05-07 DIAGNOSIS — M9904 Segmental and somatic dysfunction of sacral region: Secondary | ICD-10-CM | POA: Diagnosis not present

## 2018-05-07 DIAGNOSIS — M9903 Segmental and somatic dysfunction of lumbar region: Secondary | ICD-10-CM | POA: Diagnosis not present

## 2018-05-07 DIAGNOSIS — M5136 Other intervertebral disc degeneration, lumbar region: Secondary | ICD-10-CM | POA: Diagnosis not present

## 2018-05-07 DIAGNOSIS — M5442 Lumbago with sciatica, left side: Secondary | ICD-10-CM | POA: Diagnosis not present

## 2018-05-07 DIAGNOSIS — M5413 Radiculopathy, cervicothoracic region: Secondary | ICD-10-CM | POA: Diagnosis not present

## 2018-05-07 DIAGNOSIS — M9901 Segmental and somatic dysfunction of cervical region: Secondary | ICD-10-CM | POA: Diagnosis not present

## 2018-05-07 DIAGNOSIS — M5137 Other intervertebral disc degeneration, lumbosacral region: Secondary | ICD-10-CM | POA: Diagnosis not present

## 2018-05-07 DIAGNOSIS — M62830 Muscle spasm of back: Secondary | ICD-10-CM | POA: Diagnosis not present

## 2018-05-12 ENCOUNTER — Other Ambulatory Visit: Payer: Self-pay | Admitting: Cardiology

## 2018-05-12 DIAGNOSIS — M9901 Segmental and somatic dysfunction of cervical region: Secondary | ICD-10-CM | POA: Diagnosis not present

## 2018-05-12 DIAGNOSIS — M5413 Radiculopathy, cervicothoracic region: Secondary | ICD-10-CM | POA: Diagnosis not present

## 2018-05-12 DIAGNOSIS — M9903 Segmental and somatic dysfunction of lumbar region: Secondary | ICD-10-CM | POA: Diagnosis not present

## 2018-05-12 DIAGNOSIS — I2510 Atherosclerotic heart disease of native coronary artery without angina pectoris: Secondary | ICD-10-CM

## 2018-05-12 DIAGNOSIS — M5442 Lumbago with sciatica, left side: Secondary | ICD-10-CM | POA: Diagnosis not present

## 2018-05-12 DIAGNOSIS — E785 Hyperlipidemia, unspecified: Secondary | ICD-10-CM | POA: Diagnosis not present

## 2018-05-12 DIAGNOSIS — M5136 Other intervertebral disc degeneration, lumbar region: Secondary | ICD-10-CM | POA: Diagnosis not present

## 2018-05-12 DIAGNOSIS — E039 Hypothyroidism, unspecified: Secondary | ICD-10-CM | POA: Diagnosis not present

## 2018-05-12 DIAGNOSIS — I255 Ischemic cardiomyopathy: Secondary | ICD-10-CM

## 2018-05-12 DIAGNOSIS — M5137 Other intervertebral disc degeneration, lumbosacral region: Secondary | ICD-10-CM | POA: Diagnosis not present

## 2018-05-12 DIAGNOSIS — M9904 Segmental and somatic dysfunction of sacral region: Secondary | ICD-10-CM | POA: Diagnosis not present

## 2018-05-12 DIAGNOSIS — R42 Dizziness and giddiness: Secondary | ICD-10-CM

## 2018-05-12 DIAGNOSIS — K219 Gastro-esophageal reflux disease without esophagitis: Secondary | ICD-10-CM | POA: Diagnosis not present

## 2018-05-12 DIAGNOSIS — I10 Essential (primary) hypertension: Secondary | ICD-10-CM | POA: Diagnosis not present

## 2018-05-12 DIAGNOSIS — M62830 Muscle spasm of back: Secondary | ICD-10-CM | POA: Diagnosis not present

## 2018-05-14 ENCOUNTER — Ambulatory Visit (INDEPENDENT_AMBULATORY_CARE_PROVIDER_SITE_OTHER): Payer: PPO

## 2018-05-14 DIAGNOSIS — R42 Dizziness and giddiness: Secondary | ICD-10-CM | POA: Diagnosis not present

## 2018-05-14 DIAGNOSIS — I2510 Atherosclerotic heart disease of native coronary artery without angina pectoris: Secondary | ICD-10-CM | POA: Diagnosis not present

## 2018-05-14 DIAGNOSIS — I255 Ischemic cardiomyopathy: Secondary | ICD-10-CM | POA: Diagnosis not present

## 2018-05-14 DIAGNOSIS — M9904 Segmental and somatic dysfunction of sacral region: Secondary | ICD-10-CM | POA: Diagnosis not present

## 2018-05-14 DIAGNOSIS — M5413 Radiculopathy, cervicothoracic region: Secondary | ICD-10-CM | POA: Diagnosis not present

## 2018-05-14 DIAGNOSIS — M5137 Other intervertebral disc degeneration, lumbosacral region: Secondary | ICD-10-CM | POA: Diagnosis not present

## 2018-05-14 DIAGNOSIS — M9901 Segmental and somatic dysfunction of cervical region: Secondary | ICD-10-CM | POA: Diagnosis not present

## 2018-05-14 DIAGNOSIS — M62830 Muscle spasm of back: Secondary | ICD-10-CM | POA: Diagnosis not present

## 2018-05-14 DIAGNOSIS — M9903 Segmental and somatic dysfunction of lumbar region: Secondary | ICD-10-CM | POA: Diagnosis not present

## 2018-05-14 DIAGNOSIS — M5136 Other intervertebral disc degeneration, lumbar region: Secondary | ICD-10-CM | POA: Diagnosis not present

## 2018-05-14 DIAGNOSIS — M5442 Lumbago with sciatica, left side: Secondary | ICD-10-CM | POA: Diagnosis not present

## 2018-05-16 DIAGNOSIS — M5137 Other intervertebral disc degeneration, lumbosacral region: Secondary | ICD-10-CM | POA: Diagnosis not present

## 2018-05-16 DIAGNOSIS — M9901 Segmental and somatic dysfunction of cervical region: Secondary | ICD-10-CM | POA: Diagnosis not present

## 2018-05-16 DIAGNOSIS — M5413 Radiculopathy, cervicothoracic region: Secondary | ICD-10-CM | POA: Diagnosis not present

## 2018-05-16 DIAGNOSIS — M5136 Other intervertebral disc degeneration, lumbar region: Secondary | ICD-10-CM | POA: Diagnosis not present

## 2018-05-16 DIAGNOSIS — M9904 Segmental and somatic dysfunction of sacral region: Secondary | ICD-10-CM | POA: Diagnosis not present

## 2018-05-16 DIAGNOSIS — M9903 Segmental and somatic dysfunction of lumbar region: Secondary | ICD-10-CM | POA: Diagnosis not present

## 2018-05-16 DIAGNOSIS — M5442 Lumbago with sciatica, left side: Secondary | ICD-10-CM | POA: Diagnosis not present

## 2018-05-16 DIAGNOSIS — M62830 Muscle spasm of back: Secondary | ICD-10-CM | POA: Diagnosis not present

## 2018-05-19 DIAGNOSIS — M62830 Muscle spasm of back: Secondary | ICD-10-CM | POA: Diagnosis not present

## 2018-05-19 DIAGNOSIS — M5137 Other intervertebral disc degeneration, lumbosacral region: Secondary | ICD-10-CM | POA: Diagnosis not present

## 2018-05-19 DIAGNOSIS — M9903 Segmental and somatic dysfunction of lumbar region: Secondary | ICD-10-CM | POA: Diagnosis not present

## 2018-05-19 DIAGNOSIS — M9904 Segmental and somatic dysfunction of sacral region: Secondary | ICD-10-CM | POA: Diagnosis not present

## 2018-05-19 DIAGNOSIS — M5413 Radiculopathy, cervicothoracic region: Secondary | ICD-10-CM | POA: Diagnosis not present

## 2018-05-19 DIAGNOSIS — M5136 Other intervertebral disc degeneration, lumbar region: Secondary | ICD-10-CM | POA: Diagnosis not present

## 2018-05-19 DIAGNOSIS — M9901 Segmental and somatic dysfunction of cervical region: Secondary | ICD-10-CM | POA: Diagnosis not present

## 2018-05-19 DIAGNOSIS — M5442 Lumbago with sciatica, left side: Secondary | ICD-10-CM | POA: Diagnosis not present

## 2018-05-21 DIAGNOSIS — M5442 Lumbago with sciatica, left side: Secondary | ICD-10-CM | POA: Diagnosis not present

## 2018-05-21 DIAGNOSIS — M5137 Other intervertebral disc degeneration, lumbosacral region: Secondary | ICD-10-CM | POA: Diagnosis not present

## 2018-05-21 DIAGNOSIS — M9903 Segmental and somatic dysfunction of lumbar region: Secondary | ICD-10-CM | POA: Diagnosis not present

## 2018-05-21 DIAGNOSIS — M9901 Segmental and somatic dysfunction of cervical region: Secondary | ICD-10-CM | POA: Diagnosis not present

## 2018-05-21 DIAGNOSIS — M62830 Muscle spasm of back: Secondary | ICD-10-CM | POA: Diagnosis not present

## 2018-05-21 DIAGNOSIS — M9904 Segmental and somatic dysfunction of sacral region: Secondary | ICD-10-CM | POA: Diagnosis not present

## 2018-05-21 DIAGNOSIS — M5136 Other intervertebral disc degeneration, lumbar region: Secondary | ICD-10-CM | POA: Diagnosis not present

## 2018-05-21 DIAGNOSIS — M5413 Radiculopathy, cervicothoracic region: Secondary | ICD-10-CM | POA: Diagnosis not present

## 2018-05-23 DIAGNOSIS — M5413 Radiculopathy, cervicothoracic region: Secondary | ICD-10-CM | POA: Diagnosis not present

## 2018-05-23 DIAGNOSIS — M5137 Other intervertebral disc degeneration, lumbosacral region: Secondary | ICD-10-CM | POA: Diagnosis not present

## 2018-05-23 DIAGNOSIS — M5442 Lumbago with sciatica, left side: Secondary | ICD-10-CM | POA: Diagnosis not present

## 2018-05-23 DIAGNOSIS — M5136 Other intervertebral disc degeneration, lumbar region: Secondary | ICD-10-CM | POA: Diagnosis not present

## 2018-05-23 DIAGNOSIS — M9903 Segmental and somatic dysfunction of lumbar region: Secondary | ICD-10-CM | POA: Diagnosis not present

## 2018-05-23 DIAGNOSIS — L570 Actinic keratosis: Secondary | ICD-10-CM | POA: Diagnosis not present

## 2018-05-23 DIAGNOSIS — M62830 Muscle spasm of back: Secondary | ICD-10-CM | POA: Diagnosis not present

## 2018-05-23 DIAGNOSIS — M9901 Segmental and somatic dysfunction of cervical region: Secondary | ICD-10-CM | POA: Diagnosis not present

## 2018-05-23 DIAGNOSIS — M9904 Segmental and somatic dysfunction of sacral region: Secondary | ICD-10-CM | POA: Diagnosis not present

## 2018-05-26 DIAGNOSIS — M5442 Lumbago with sciatica, left side: Secondary | ICD-10-CM | POA: Diagnosis not present

## 2018-05-26 DIAGNOSIS — M5136 Other intervertebral disc degeneration, lumbar region: Secondary | ICD-10-CM | POA: Diagnosis not present

## 2018-05-26 DIAGNOSIS — M9903 Segmental and somatic dysfunction of lumbar region: Secondary | ICD-10-CM | POA: Diagnosis not present

## 2018-05-26 DIAGNOSIS — M9904 Segmental and somatic dysfunction of sacral region: Secondary | ICD-10-CM | POA: Diagnosis not present

## 2018-05-26 DIAGNOSIS — M5413 Radiculopathy, cervicothoracic region: Secondary | ICD-10-CM | POA: Diagnosis not present

## 2018-05-26 DIAGNOSIS — M9901 Segmental and somatic dysfunction of cervical region: Secondary | ICD-10-CM | POA: Diagnosis not present

## 2018-05-26 DIAGNOSIS — M62830 Muscle spasm of back: Secondary | ICD-10-CM | POA: Diagnosis not present

## 2018-05-26 DIAGNOSIS — M5137 Other intervertebral disc degeneration, lumbosacral region: Secondary | ICD-10-CM | POA: Diagnosis not present

## 2018-05-28 DIAGNOSIS — M5413 Radiculopathy, cervicothoracic region: Secondary | ICD-10-CM | POA: Diagnosis not present

## 2018-05-28 DIAGNOSIS — M5137 Other intervertebral disc degeneration, lumbosacral region: Secondary | ICD-10-CM | POA: Diagnosis not present

## 2018-05-28 DIAGNOSIS — M9904 Segmental and somatic dysfunction of sacral region: Secondary | ICD-10-CM | POA: Diagnosis not present

## 2018-05-28 DIAGNOSIS — M62830 Muscle spasm of back: Secondary | ICD-10-CM | POA: Diagnosis not present

## 2018-05-28 DIAGNOSIS — M5136 Other intervertebral disc degeneration, lumbar region: Secondary | ICD-10-CM | POA: Diagnosis not present

## 2018-05-28 DIAGNOSIS — M9903 Segmental and somatic dysfunction of lumbar region: Secondary | ICD-10-CM | POA: Diagnosis not present

## 2018-05-28 DIAGNOSIS — M9901 Segmental and somatic dysfunction of cervical region: Secondary | ICD-10-CM | POA: Diagnosis not present

## 2018-05-28 DIAGNOSIS — M5442 Lumbago with sciatica, left side: Secondary | ICD-10-CM | POA: Diagnosis not present

## 2018-05-29 DIAGNOSIS — N401 Enlarged prostate with lower urinary tract symptoms: Secondary | ICD-10-CM | POA: Diagnosis not present

## 2018-05-29 DIAGNOSIS — R339 Retention of urine, unspecified: Secondary | ICD-10-CM | POA: Diagnosis not present

## 2018-05-30 DIAGNOSIS — M5413 Radiculopathy, cervicothoracic region: Secondary | ICD-10-CM | POA: Diagnosis not present

## 2018-05-30 DIAGNOSIS — M62830 Muscle spasm of back: Secondary | ICD-10-CM | POA: Diagnosis not present

## 2018-05-30 DIAGNOSIS — M5136 Other intervertebral disc degeneration, lumbar region: Secondary | ICD-10-CM | POA: Diagnosis not present

## 2018-05-30 DIAGNOSIS — M9903 Segmental and somatic dysfunction of lumbar region: Secondary | ICD-10-CM | POA: Diagnosis not present

## 2018-05-30 DIAGNOSIS — M9904 Segmental and somatic dysfunction of sacral region: Secondary | ICD-10-CM | POA: Diagnosis not present

## 2018-05-30 DIAGNOSIS — M5442 Lumbago with sciatica, left side: Secondary | ICD-10-CM | POA: Diagnosis not present

## 2018-05-30 DIAGNOSIS — M5137 Other intervertebral disc degeneration, lumbosacral region: Secondary | ICD-10-CM | POA: Diagnosis not present

## 2018-05-30 DIAGNOSIS — M9901 Segmental and somatic dysfunction of cervical region: Secondary | ICD-10-CM | POA: Diagnosis not present

## 2018-06-02 DIAGNOSIS — M62830 Muscle spasm of back: Secondary | ICD-10-CM | POA: Diagnosis not present

## 2018-06-02 DIAGNOSIS — M9901 Segmental and somatic dysfunction of cervical region: Secondary | ICD-10-CM | POA: Diagnosis not present

## 2018-06-02 DIAGNOSIS — M9904 Segmental and somatic dysfunction of sacral region: Secondary | ICD-10-CM | POA: Diagnosis not present

## 2018-06-02 DIAGNOSIS — M5136 Other intervertebral disc degeneration, lumbar region: Secondary | ICD-10-CM | POA: Diagnosis not present

## 2018-06-02 DIAGNOSIS — M5137 Other intervertebral disc degeneration, lumbosacral region: Secondary | ICD-10-CM | POA: Diagnosis not present

## 2018-06-02 DIAGNOSIS — M9903 Segmental and somatic dysfunction of lumbar region: Secondary | ICD-10-CM | POA: Diagnosis not present

## 2018-06-02 DIAGNOSIS — M5442 Lumbago with sciatica, left side: Secondary | ICD-10-CM | POA: Diagnosis not present

## 2018-06-02 DIAGNOSIS — M5413 Radiculopathy, cervicothoracic region: Secondary | ICD-10-CM | POA: Diagnosis not present

## 2018-06-06 DIAGNOSIS — M9903 Segmental and somatic dysfunction of lumbar region: Secondary | ICD-10-CM | POA: Diagnosis not present

## 2018-06-06 DIAGNOSIS — M9904 Segmental and somatic dysfunction of sacral region: Secondary | ICD-10-CM | POA: Diagnosis not present

## 2018-06-06 DIAGNOSIS — M5442 Lumbago with sciatica, left side: Secondary | ICD-10-CM | POA: Diagnosis not present

## 2018-06-06 DIAGNOSIS — M62830 Muscle spasm of back: Secondary | ICD-10-CM | POA: Diagnosis not present

## 2018-06-06 DIAGNOSIS — M5413 Radiculopathy, cervicothoracic region: Secondary | ICD-10-CM | POA: Diagnosis not present

## 2018-06-06 DIAGNOSIS — M9901 Segmental and somatic dysfunction of cervical region: Secondary | ICD-10-CM | POA: Diagnosis not present

## 2018-06-06 DIAGNOSIS — M5137 Other intervertebral disc degeneration, lumbosacral region: Secondary | ICD-10-CM | POA: Diagnosis not present

## 2018-06-06 DIAGNOSIS — M5136 Other intervertebral disc degeneration, lumbar region: Secondary | ICD-10-CM | POA: Diagnosis not present

## 2018-06-09 DIAGNOSIS — M9901 Segmental and somatic dysfunction of cervical region: Secondary | ICD-10-CM | POA: Diagnosis not present

## 2018-06-09 DIAGNOSIS — M62830 Muscle spasm of back: Secondary | ICD-10-CM | POA: Diagnosis not present

## 2018-06-09 DIAGNOSIS — M9904 Segmental and somatic dysfunction of sacral region: Secondary | ICD-10-CM | POA: Diagnosis not present

## 2018-06-09 DIAGNOSIS — M5137 Other intervertebral disc degeneration, lumbosacral region: Secondary | ICD-10-CM | POA: Diagnosis not present

## 2018-06-09 DIAGNOSIS — M5136 Other intervertebral disc degeneration, lumbar region: Secondary | ICD-10-CM | POA: Diagnosis not present

## 2018-06-09 DIAGNOSIS — M5413 Radiculopathy, cervicothoracic region: Secondary | ICD-10-CM | POA: Diagnosis not present

## 2018-06-09 DIAGNOSIS — M5442 Lumbago with sciatica, left side: Secondary | ICD-10-CM | POA: Diagnosis not present

## 2018-06-09 DIAGNOSIS — M9903 Segmental and somatic dysfunction of lumbar region: Secondary | ICD-10-CM | POA: Diagnosis not present

## 2018-06-12 DIAGNOSIS — M5413 Radiculopathy, cervicothoracic region: Secondary | ICD-10-CM | POA: Diagnosis not present

## 2018-06-12 DIAGNOSIS — M5137 Other intervertebral disc degeneration, lumbosacral region: Secondary | ICD-10-CM | POA: Diagnosis not present

## 2018-06-12 DIAGNOSIS — M5442 Lumbago with sciatica, left side: Secondary | ICD-10-CM | POA: Diagnosis not present

## 2018-06-12 DIAGNOSIS — M62830 Muscle spasm of back: Secondary | ICD-10-CM | POA: Diagnosis not present

## 2018-06-12 DIAGNOSIS — M9904 Segmental and somatic dysfunction of sacral region: Secondary | ICD-10-CM | POA: Diagnosis not present

## 2018-06-12 DIAGNOSIS — M9901 Segmental and somatic dysfunction of cervical region: Secondary | ICD-10-CM | POA: Diagnosis not present

## 2018-06-12 DIAGNOSIS — M5136 Other intervertebral disc degeneration, lumbar region: Secondary | ICD-10-CM | POA: Diagnosis not present

## 2018-06-12 DIAGNOSIS — M9903 Segmental and somatic dysfunction of lumbar region: Secondary | ICD-10-CM | POA: Diagnosis not present

## 2018-06-13 DIAGNOSIS — M9903 Segmental and somatic dysfunction of lumbar region: Secondary | ICD-10-CM | POA: Diagnosis not present

## 2018-06-13 DIAGNOSIS — M5413 Radiculopathy, cervicothoracic region: Secondary | ICD-10-CM | POA: Diagnosis not present

## 2018-06-13 DIAGNOSIS — M9904 Segmental and somatic dysfunction of sacral region: Secondary | ICD-10-CM | POA: Diagnosis not present

## 2018-06-13 DIAGNOSIS — M5137 Other intervertebral disc degeneration, lumbosacral region: Secondary | ICD-10-CM | POA: Diagnosis not present

## 2018-06-13 DIAGNOSIS — M9901 Segmental and somatic dysfunction of cervical region: Secondary | ICD-10-CM | POA: Diagnosis not present

## 2018-06-13 DIAGNOSIS — M5136 Other intervertebral disc degeneration, lumbar region: Secondary | ICD-10-CM | POA: Diagnosis not present

## 2018-06-13 DIAGNOSIS — M5442 Lumbago with sciatica, left side: Secondary | ICD-10-CM | POA: Diagnosis not present

## 2018-06-13 DIAGNOSIS — M62830 Muscle spasm of back: Secondary | ICD-10-CM | POA: Diagnosis not present

## 2018-06-16 ENCOUNTER — Ambulatory Visit: Payer: PPO | Admitting: Cardiology

## 2018-06-17 ENCOUNTER — Telehealth: Payer: Self-pay | Admitting: Cardiology

## 2018-06-17 DIAGNOSIS — I4729 Other ventricular tachycardia: Secondary | ICD-10-CM

## 2018-06-17 DIAGNOSIS — I472 Ventricular tachycardia: Secondary | ICD-10-CM

## 2018-06-17 NOTE — Telephone Encounter (Signed)
Patient called to get his monitor results.

## 2018-06-17 NOTE — Telephone Encounter (Signed)
Patient informed of monitor results. Patient wife informed of appointment for ep.

## 2018-06-19 DIAGNOSIS — Z6827 Body mass index (BMI) 27.0-27.9, adult: Secondary | ICD-10-CM | POA: Diagnosis not present

## 2018-06-19 DIAGNOSIS — R42 Dizziness and giddiness: Secondary | ICD-10-CM | POA: Diagnosis not present

## 2018-06-24 ENCOUNTER — Ambulatory Visit: Payer: PPO | Admitting: Cardiology

## 2018-06-24 DIAGNOSIS — E039 Hypothyroidism, unspecified: Secondary | ICD-10-CM | POA: Diagnosis not present

## 2018-06-27 DIAGNOSIS — Z6827 Body mass index (BMI) 27.0-27.9, adult: Secondary | ICD-10-CM | POA: Diagnosis not present

## 2018-06-27 DIAGNOSIS — H8110 Benign paroxysmal vertigo, unspecified ear: Secondary | ICD-10-CM | POA: Diagnosis not present

## 2018-06-30 DIAGNOSIS — I2510 Atherosclerotic heart disease of native coronary artery without angina pectoris: Secondary | ICD-10-CM | POA: Diagnosis not present

## 2018-06-30 DIAGNOSIS — Z6827 Body mass index (BMI) 27.0-27.9, adult: Secondary | ICD-10-CM | POA: Diagnosis not present

## 2018-06-30 DIAGNOSIS — H8110 Benign paroxysmal vertigo, unspecified ear: Secondary | ICD-10-CM | POA: Diagnosis not present

## 2018-07-01 DIAGNOSIS — R339 Retention of urine, unspecified: Secondary | ICD-10-CM | POA: Diagnosis not present

## 2018-07-01 DIAGNOSIS — Z125 Encounter for screening for malignant neoplasm of prostate: Secondary | ICD-10-CM | POA: Diagnosis not present

## 2018-07-01 DIAGNOSIS — N401 Enlarged prostate with lower urinary tract symptoms: Secondary | ICD-10-CM | POA: Diagnosis not present

## 2018-07-07 DIAGNOSIS — M1990 Unspecified osteoarthritis, unspecified site: Secondary | ICD-10-CM | POA: Diagnosis not present

## 2018-07-07 DIAGNOSIS — J45909 Unspecified asthma, uncomplicated: Secondary | ICD-10-CM | POA: Diagnosis not present

## 2018-07-07 DIAGNOSIS — Z7902 Long term (current) use of antithrombotics/antiplatelets: Secondary | ICD-10-CM | POA: Diagnosis not present

## 2018-07-07 DIAGNOSIS — E039 Hypothyroidism, unspecified: Secondary | ICD-10-CM | POA: Diagnosis not present

## 2018-07-07 DIAGNOSIS — I252 Old myocardial infarction: Secondary | ICD-10-CM | POA: Diagnosis not present

## 2018-07-07 DIAGNOSIS — Z87891 Personal history of nicotine dependence: Secondary | ICD-10-CM | POA: Diagnosis not present

## 2018-07-07 DIAGNOSIS — Z79899 Other long term (current) drug therapy: Secondary | ICD-10-CM | POA: Diagnosis not present

## 2018-07-07 DIAGNOSIS — K219 Gastro-esophageal reflux disease without esophagitis: Secondary | ICD-10-CM | POA: Diagnosis not present

## 2018-07-07 DIAGNOSIS — Z955 Presence of coronary angioplasty implant and graft: Secondary | ICD-10-CM | POA: Diagnosis not present

## 2018-07-07 DIAGNOSIS — R079 Chest pain, unspecified: Secondary | ICD-10-CM | POA: Diagnosis not present

## 2018-07-07 DIAGNOSIS — I10 Essential (primary) hypertension: Secondary | ICD-10-CM | POA: Diagnosis not present

## 2018-07-07 DIAGNOSIS — D6489 Other specified anemias: Secondary | ICD-10-CM | POA: Diagnosis not present

## 2018-07-07 DIAGNOSIS — G2581 Restless legs syndrome: Secondary | ICD-10-CM | POA: Diagnosis not present

## 2018-07-07 DIAGNOSIS — Z7982 Long term (current) use of aspirin: Secondary | ICD-10-CM | POA: Diagnosis not present

## 2018-07-07 DIAGNOSIS — R0789 Other chest pain: Secondary | ICD-10-CM | POA: Diagnosis not present

## 2018-07-07 DIAGNOSIS — I2510 Atherosclerotic heart disease of native coronary artery without angina pectoris: Secondary | ICD-10-CM | POA: Diagnosis not present

## 2018-07-07 DIAGNOSIS — R400 Somnolence: Secondary | ICD-10-CM | POA: Diagnosis not present

## 2018-07-07 DIAGNOSIS — R0602 Shortness of breath: Secondary | ICD-10-CM | POA: Diagnosis not present

## 2018-07-08 DIAGNOSIS — I517 Cardiomegaly: Secondary | ICD-10-CM | POA: Diagnosis not present

## 2018-07-08 DIAGNOSIS — R079 Chest pain, unspecified: Secondary | ICD-10-CM | POA: Diagnosis not present

## 2018-07-10 ENCOUNTER — Other Ambulatory Visit: Payer: Self-pay | Admitting: *Deleted

## 2018-07-10 NOTE — Patient Outreach (Addendum)
[facility]) Care Management  07/10/2018  Shane Massey 09/05/1940 [id number]   Transition of care Referral date: 07/10/2018 Referral source: Transition of care  Insurance: [employer]   Per referral patient discharged from [facility] on 07/08/2018 and diagnosis hyperlipidemia. Transition of care will be completed by primary care provider office who will refer to [facility] care management if needed.   PLAN:  RNCM will close patient due to patient being enrolled in an external program.   Shane Massey H. Annia Friendly, BSN, [place] [facility] Telephonic CM Phone: [phone] Fax: [phone]

## 2018-07-15 DIAGNOSIS — I2510 Atherosclerotic heart disease of native coronary artery without angina pectoris: Secondary | ICD-10-CM | POA: Diagnosis not present

## 2018-07-15 DIAGNOSIS — Z6828 Body mass index (BMI) 28.0-28.9, adult: Secondary | ICD-10-CM | POA: Diagnosis not present

## 2018-07-15 DIAGNOSIS — R0789 Other chest pain: Secondary | ICD-10-CM | POA: Diagnosis not present

## 2018-07-15 DIAGNOSIS — H8110 Benign paroxysmal vertigo, unspecified ear: Secondary | ICD-10-CM | POA: Diagnosis not present

## 2018-07-17 ENCOUNTER — Ambulatory Visit (INDEPENDENT_AMBULATORY_CARE_PROVIDER_SITE_OTHER): Payer: PPO | Admitting: Cardiology

## 2018-07-17 ENCOUNTER — Encounter: Payer: Self-pay | Admitting: Cardiology

## 2018-07-17 VITALS — BP 136/64 | HR 61 | Ht 70.0 in | Wt 185.2 lb

## 2018-07-17 DIAGNOSIS — I4729 Other ventricular tachycardia: Secondary | ICD-10-CM

## 2018-07-17 DIAGNOSIS — R0600 Dyspnea, unspecified: Secondary | ICD-10-CM

## 2018-07-17 DIAGNOSIS — R0609 Other forms of dyspnea: Secondary | ICD-10-CM

## 2018-07-17 DIAGNOSIS — I2510 Atherosclerotic heart disease of native coronary artery without angina pectoris: Secondary | ICD-10-CM

## 2018-07-17 DIAGNOSIS — I472 Ventricular tachycardia: Secondary | ICD-10-CM | POA: Diagnosis not present

## 2018-07-17 DIAGNOSIS — I255 Ischemic cardiomyopathy: Secondary | ICD-10-CM

## 2018-07-17 DIAGNOSIS — R42 Dizziness and giddiness: Secondary | ICD-10-CM

## 2018-07-17 DIAGNOSIS — E785 Hyperlipidemia, unspecified: Secondary | ICD-10-CM

## 2018-07-17 HISTORY — DX: Ventricular tachycardia: I47.2

## 2018-07-17 HISTORY — DX: Other ventricular tachycardia: I47.29

## 2018-07-17 MED ORDER — RANITIDINE HCL 150 MG PO TABS: 150.0000 mg | ORAL_TABLET | Freq: Two times a day (BID) | ORAL | 1 refills | Status: DC

## 2018-07-17 NOTE — Patient Instructions (Signed)
Medication Instructions:  Your physician has recommended you make the following change in your medication:  START: Zantac 150 mg twice daily   If you need a refill on your cardiac medications before your next appointment, please call your pharmacy.   Lab work: None.  If you have labs (blood work) drawn today and your tests are completely normal, you will receive your results only by: Marland Kitchen MyChart Message (if you have MyChart) OR . A paper copy in the mail If you have any lab test that is abnormal or we need to change your treatment, we will call you to review the results.  Testing/Procedures: None.   Follow-Up: At [facility], you and your health needs are our priority.  As part of our continuing mission to provide you with exceptional heart care, we have created designated Provider Care Teams.  These Care Teams include your primary Cardiologist (physician) and Advanced Practice Providers (APPs -  Physician Assistants and Nurse Practitioners) who all work together to provide you with the care you need, when you need it. You will need a follow up appointment in 2 months.  Please call our office 2 months in advance to schedule this appointment.  You may see Jenne Campus, MD or another member of our [place] Provider Team in [place]: Shirlee More, MD . Jyl Heinz, MD  Any Other Special Instructions Will Be Listed Below (If Applicable).  Ranitidine tablets or capsules What is this medicine? RANITIDINE (ra NYE te deen) is a type of antihistamine that blocks the release of stomach acid. It is used to treat stomach or intestinal ulcers. It can relieve ulcer pain and discomfort, and the heartburn from acid reflux. This medicine may be used for other purposes; ask your health care provider or pharmacist if you have questions. COMMON BRAND NAME(S): Acid Reducer, Ranitidine, Taladine, Wal-Zan, Zantac, Zantac 150, Zantac 75 What should I tell my health care provider before I take  this medicine? They need to know if you have any of these conditions: -kidney disease -liver disease -porphyria -an unusual or allergic reaction to ranitidine, other medicines, foods, dyes, or preservatives -pregnant or trying to get pregnant -breast-feeding How should I use this medicine? Take this medicine by mouth with a glass of water. Follow the directions on the prescription label. If you only take this medicine once a day, take it at bedtime. Take your medicine at regular intervals. Do not take your medicine more often than directed. Do not stop taking except on your doctor's advice. Talk to your pediatrician regarding the use of this medicine in children. Special care may be needed. Overdosage: If you think you have taken too much of this medicine contact a poison control center or emergency room at once. NOTE: This medicine is only for you. Do not share this medicine with others. What if I miss a dose? If you miss a dose, take it as soon as you can. If it is almost time for your next dose, take only that dose. Do not take double or extra doses. What may interact with this medicine? -atazanavir -delavirdine -gefitinib -glipizide -ketoconazole -midazolam -procainamide -propantheline -triazolam -warfarin This list may not describe all possible interactions. Give your health care provider a list of all the medicines, herbs, non-prescription drugs, or dietary supplements you use. Also tell them if you smoke, drink alcohol, or use illegal drugs. Some items may interact with your medicine. What should I watch for while using this medicine? Tell your doctor or health care professional  if your condition does not start to get better or gets worse. You may need to take this medicine for several days as prescribed before your symptoms get better. Finish the full course of tablets prescribed, even if you feel better. Do not smoke cigarettes or drink alcohol. These increase irritation in your  stomach and can lengthen the time it will take for ulcers to heal. Cigarettes and alcohol can also make acid reflux or heartburn worse. If you get black, tarry stools or vomit up what looks like coffee grounds, call your doctor or health care professional at once. You may have a bleeding ulcer. This medicine may cause a decrease in vitamin B12. You should make sure that you get enough vitamin B12 while you are taking this medicine. Discuss the foods you eat and the vitamins you take with your health care professional. What side effects may I notice from receiving this medicine? Side effects that you should report to your doctor or health care professional as soon as possible: -agitation, nervousness, depression, hallucinations -allergic reactions like skin rash, itching or hives, swelling of the face, lips, or tongue -breast enlargement in both males and females -breathing problems -redness, blistering, peeling or loosening of the skin, including inside the mouth -unusual bleeding or bruising -unusually weak or tired -vomiting -yellowing of the skin or eyes Side effects that usually do not require medical attention (report to your doctor or health care professional if they continue or are bothersome): -constipation or diarrhea -dizziness -headache -nausea This list may not describe all possible side effects. Call your doctor for medical advice about side effects. You may report side effects to FDA at 1-800-FDA-1088. Where should I keep my medicine? Keep out of the reach of children. Store at room temperature between 15 and 30 degrees C (59 and 86 degrees F). Protect from light and moisture. Keep container tightly closed. Throw away any unused medicine after the expiration date. NOTE: This sheet is a summary. It may not cover all possible information. If you have questions about this medicine, talk to your doctor, pharmacist, or health care provider.  2019 Elsevier/Gold Standard (2017-01-11  13:22:30)

## 2018-07-17 NOTE — Progress Notes (Signed)
Cardiology Office Note:    Date:  07/17/2018   ID:  Shane Massey, DOB [birth year], MRN [id number]  PCP:  Maryella Shivers, MD  Cardiologist:  Jenne Campus, MD    Referring MD: Maryella Shivers, MD   Chief Complaint  Patient presents with  . Follow up on monitor  Am here to follow-up, I was in the hospital  History of Present Illness:    Shane Massey is a 78 y.o. male with remote history of coronary artery disease.  He has been experiencing some strange symptoms for last few months quite extensive evaluation has been done for that which included echocardiogram as well as stress things thing it was unrevealing, however recent monitor showed evidence of nonsustained ventricular tachycardia.  About 2 weeks ago he was working cutting wood he did well however after he finished his sit down and realized he was having some chest pain he ended up going to the home when he came there he said the pain was gone however his wife noted that he looked differently she asked him if this problem he said there is some eventually he ended up getting nitroglycerin after he took nitroglycerin he said he started having some blurred vision and also started having tingling in his fingers eventually 911 was called he was brought to the emergency room EKG was normal biochemical markers were normal.  Echocardiogram was done which showed preserved left ventricular ejection fraction, stress test was done which was done in form of [place] which showed no evidence of ischemia he is here to talk about this.  Biggest concern that I have is his arrhythmia with constellation of symptoms that he got he may require more aggressive evaluation for coronary artery disease he is already scheduled to see EP team however I will try to expedite that visit.  He may eventually require cardiac catheterization to clarify the diagnosis however repeated stress test so far has been negative.  He does have some stomach issue and actually what  I am going to do is to give him some extra medication for stomach and give him Zantac 150 twice daily to see if he feels any better.  He denies having any passing out recently but does get dizzy sometimes.  Past Medical History:  Diagnosis Date  . GERD (gastroesophageal reflux disease)   . Hypothyroidism   . OSA (obstructive sleep apnea)     Past Surgical History:  Procedure Laterality Date  . heart stent Left 09/2015    Current Medications: Current Meds  Medication Sig  . aspirin 81 MG tablet Take 81 mg by mouth daily.  Marland Kitchen atorvastatin (LIPITOR) 80 MG tablet Take 1 tablet (80 mg total) by mouth daily.  . calcium carbonate (CALCIUM 600) 600 MG TABS tablet Take 1 tablet by mouth daily.  . clopidogrel (PLAVIX) 75 MG tablet Take 1 tablet (75 mg total) by mouth daily.  . Coenzyme Q-10 200 MG CAPS Take 200 mg by mouth daily.  . isosorbide mononitrate (IMDUR) 30 MG 24 hr tablet Take 1 tablet (30 mg total) by mouth daily.  Marland Kitchen levothyroxine (SYNTHROID, LEVOTHROID) 50 MCG tablet Take 50 mcg by mouth daily before breakfast.  . Multiple Vitamin (MULTIVITAMIN) tablet Take 1 tablet by mouth daily.  . pantoprazole (PROTONIX) 40 MG tablet Take 40 mg by mouth daily.  . pramipexole (MIRAPEX) 1.5 MG tablet Take 1.5 mg by mouth at bedtime.   . ranolazine (RANEXA) 500 MG 12 hr tablet Take 1 tablet (500 mg total) by  mouth 2 (two) times daily.     Allergies:   Patient has no known allergies.   Social History   Socioeconomic History  . Marital status: Married    Spouse name: Not on file  . Number of children: Not on file  . Years of education: Not on file  . Highest education level: Not on file  Occupational History  . Not on file  Social Needs  . Financial resource strain: Not on file  . Food insecurity:    Worry: Not on file    Inability: Not on file  . Transportation needs:    Medical: Not on file    Non-medical: Not on file  Tobacco Use  . Smoking status: Former Smoker    Packs/day:  0.50    Years: 4.00    Pack years: 2.00    Types: Cigarettes    Last attempt to quit: 06/11/1958    Years since quitting: 60.1  . Smokeless tobacco: Former Systems developer    Types: [place] date: 12/01/1958  Substance and Sexual Activity  . Alcohol use: No    Alcohol/week: 0.0 standard drinks  . Drug use: No  . Sexual activity: Not on file  Lifestyle  . Physical activity:    Days per week: Not on file    Minutes per session: Not on file  . Stress: Not on file  Relationships  . Social connections:    Talks on phone: Not on file    Gets together: Not on file    Attends religious service: Not on file    Active member of club or organization: Not on file    Attends meetings of clubs or organizations: Not on file    Relationship status: Not on file  Other Topics Concern  . Not on file  Social History Narrative  . Not on file     Family History: The patient's family history includes Asthma in his brother and maternal grandmother; Heart attack in his brother; Heart disease in his brother; Stroke in his father. ROS:   Please see the history of present illness.    All 14 point review of systems negative except as described per history of present illness  EKGs/Labs/Other Studies Reviewed:      Recent Labs: No results found for requested labs within last 8760 hours.  Recent Lipid Panel No results found for: CHOL, TRIG, HDL, CHOLHDL, VLDL, LDLCALC, LDLDIRECT  Physical Exam:    VS:  BP 136/64   Pulse 61   Ht 5\' 10"  (1.778 m)   Wt 185 lb 3.2 oz (84 kg)   SpO2 97%   BMI 26.57 kg/m     Wt Readings from Last 3 Encounters:  07/17/18 185 lb 3.2 oz (84 kg)  04/30/18 179 lb 12.8 oz (81.6 kg)  04/09/18 177 lb 3.2 oz (80.4 kg)     GEN:  Well nourished, well developed in no acute distress HEENT: Normal NECK: No JVD; No carotid bruits LYMPHATICS: No lymphadenopathy CARDIAC: RRR, no murmurs, no rubs, no gallops RESPIRATORY:  Clear to auscultation without rales, wheezing or rhonchi    ABDOMEN: Soft, non-tender, non-distended MUSCULOSKELETAL:  No edema; No deformity  SKIN: Warm and dry LOWER EXTREMITIES: no swelling NEUROLOGIC:  Alert and oriented x 3 PSYCHIATRIC:  Normal affect   ASSESSMENT:    1. Ischemic cardiomyopathy   2. Nonsustained ventricular tachycardia ([place])   3. Coronary artery disease involving native heart without angina pectoris, unspecified vessel or lesion type  4. Dyspnea on exertion   5. Dyslipidemia   6. Dizziness    PLAN:    In order of problems listed above:  1. Ischemic cardiomyopathy however latest echocardiogram showed ejection fraction 50%.  Recent stress test negative.  Still concerning symptoms. 2. Nonsustained ventricular tachycardia we will try to expedite visit with EP team trying to make a decision about how aggressive we want to be in evaluating of this problem.  He may require cardiac catheterization but again recent stress test was normal. 3. Coronary artery disease plan as outlined above. 4. Dyspnea on exertion denies having much of that.  He still very active works hard in the garden. 5. Dyslipidemia on high intensity statin.   Medication Adjustments/Labs and Tests Ordered: Current medicines are reviewed at length with the patient today.  Concerns regarding medicines are outlined above.  No orders of the defined types were placed in this encounter.  Medication changes: No orders of the defined types were placed in this encounter.   Signed, Park Liter, MD, [facility] 07/17/2018 9:01 AM    [place]

## 2018-07-17 NOTE — Addendum Note (Signed)
Addended by: Ashok Norris on: 07/17/2018 09:13 AM   Modules accepted: Orders

## 2018-08-08 ENCOUNTER — Telehealth: Payer: Self-pay | Admitting: Cardiology

## 2018-08-08 MED ORDER — ISOSORBIDE MONONITRATE ER 30 MG PO TB24: 30.0000 mg | ORAL_TABLET | Freq: Every day | ORAL | 3 refills | Status: DC

## 2018-08-08 NOTE — Telephone Encounter (Signed)
Call isosorbide to [place] [facility]

## 2018-08-08 NOTE — Telephone Encounter (Signed)
Refills sent

## 2018-08-12 DIAGNOSIS — N3941 Urge incontinence: Secondary | ICD-10-CM | POA: Diagnosis not present

## 2018-08-12 DIAGNOSIS — N401 Enlarged prostate with lower urinary tract symptoms: Secondary | ICD-10-CM | POA: Diagnosis not present

## 2018-08-12 DIAGNOSIS — R339 Retention of urine, unspecified: Secondary | ICD-10-CM | POA: Diagnosis not present

## 2018-08-20 ENCOUNTER — Telehealth: Payer: Self-pay | Admitting: Cardiology

## 2018-08-20 MED ORDER — CLOPIDOGREL BISULFATE 75 MG PO TABS: 75.0000 mg | ORAL_TABLET | Freq: Every day | ORAL | 1 refills | Status: DC

## 2018-08-20 NOTE — Telephone Encounter (Signed)
Clopidogrel 75 mg daily refilled  

## 2018-08-20 NOTE — Telephone Encounter (Signed)
Call clopidigril to [place] [facility]

## 2018-08-20 NOTE — Addendum Note (Signed)
Addended by: Ashok Norris on: 08/20/2018 03:13 PM   Modules accepted: Orders

## 2018-08-25 ENCOUNTER — Institutional Professional Consult (permissible substitution): Payer: PPO | Admitting: Cardiology

## 2018-09-09 DIAGNOSIS — I10 Essential (primary) hypertension: Secondary | ICD-10-CM | POA: Diagnosis not present

## 2018-09-09 DIAGNOSIS — E039 Hypothyroidism, unspecified: Secondary | ICD-10-CM | POA: Diagnosis not present

## 2018-09-09 DIAGNOSIS — E785 Hyperlipidemia, unspecified: Secondary | ICD-10-CM | POA: Diagnosis not present

## 2018-09-09 DIAGNOSIS — I2510 Atherosclerotic heart disease of native coronary artery without angina pectoris: Secondary | ICD-10-CM | POA: Diagnosis not present

## 2018-09-09 DIAGNOSIS — K219 Gastro-esophageal reflux disease without esophagitis: Secondary | ICD-10-CM | POA: Diagnosis not present

## 2018-09-25 DIAGNOSIS — C44622 Squamous cell carcinoma of skin of right upper limb, including shoulder: Secondary | ICD-10-CM | POA: Diagnosis not present

## 2018-09-25 DIAGNOSIS — L821 Other seborrheic keratosis: Secondary | ICD-10-CM | POA: Diagnosis not present

## 2018-09-25 DIAGNOSIS — L578 Other skin changes due to chronic exposure to nonionizing radiation: Secondary | ICD-10-CM | POA: Diagnosis not present

## 2018-09-25 DIAGNOSIS — L570 Actinic keratosis: Secondary | ICD-10-CM | POA: Diagnosis not present

## 2018-09-25 DIAGNOSIS — L820 Inflamed seborrheic keratosis: Secondary | ICD-10-CM | POA: Diagnosis not present

## 2018-09-29 DIAGNOSIS — Z1331 Encounter for screening for depression: Secondary | ICD-10-CM | POA: Diagnosis not present

## 2018-09-29 DIAGNOSIS — Z0000 Encounter for general adult medical examination without abnormal findings: Secondary | ICD-10-CM | POA: Diagnosis not present

## 2018-09-29 DIAGNOSIS — Z139 Encounter for screening, unspecified: Secondary | ICD-10-CM | POA: Diagnosis not present

## 2018-10-06 DIAGNOSIS — E785 Hyperlipidemia, unspecified: Secondary | ICD-10-CM | POA: Diagnosis not present

## 2018-10-06 DIAGNOSIS — I10 Essential (primary) hypertension: Secondary | ICD-10-CM | POA: Diagnosis not present

## 2018-10-06 DIAGNOSIS — R7303 Prediabetes: Secondary | ICD-10-CM | POA: Diagnosis not present

## 2018-10-07 ENCOUNTER — Telehealth: Payer: Self-pay | Admitting: Cardiology

## 2018-10-07 NOTE — Telephone Encounter (Signed)
Called pt to follow up on rescheduling consult w/ Dr. Curt Bears per referral from Dr. Agustin Cree  (consult appt scheduled for 3/16 was cancelled d/t COVID-19). Pt states he is doing fine and doesn't believe he needs the appt at this time. Will forward to Dr. Agustin Cree for his FYI.    (Dr. Raliegh Ip - if you feel pt still needs consult please inform pt and place new referral to EP if needed. Thanks)

## 2018-10-07 NOTE — Telephone Encounter (Signed)
Disregard

## 2018-10-09 DIAGNOSIS — K219 Gastro-esophageal reflux disease without esophagitis: Secondary | ICD-10-CM | POA: Diagnosis not present

## 2018-10-09 DIAGNOSIS — I10 Essential (primary) hypertension: Secondary | ICD-10-CM | POA: Diagnosis not present

## 2018-10-09 DIAGNOSIS — E785 Hyperlipidemia, unspecified: Secondary | ICD-10-CM | POA: Diagnosis not present

## 2018-10-09 DIAGNOSIS — I2510 Atherosclerotic heart disease of native coronary artery without angina pectoris: Secondary | ICD-10-CM | POA: Diagnosis not present

## 2018-10-13 DIAGNOSIS — E785 Hyperlipidemia, unspecified: Secondary | ICD-10-CM | POA: Diagnosis not present

## 2018-10-13 DIAGNOSIS — E039 Hypothyroidism, unspecified: Secondary | ICD-10-CM | POA: Diagnosis not present

## 2018-10-13 DIAGNOSIS — R7303 Prediabetes: Secondary | ICD-10-CM | POA: Diagnosis not present

## 2018-10-13 DIAGNOSIS — I10 Essential (primary) hypertension: Secondary | ICD-10-CM | POA: Diagnosis not present

## 2018-10-21 ENCOUNTER — Other Ambulatory Visit: Payer: Self-pay | Admitting: Cardiology

## 2018-10-21 MED ORDER — ATORVASTATIN CALCIUM 80 MG PO TABS: 80.0000 mg | ORAL_TABLET | Freq: Every day | ORAL | 1 refills | Status: DC

## 2018-10-21 NOTE — Telephone Encounter (Signed)
Refill sent in

## 2018-10-21 NOTE — Telephone Encounter (Signed)
°*  STAT* If patient is at the pharmacy, call can be transferred to refill team.   1. Which medications need to be refilled? (please list name of each medication and dose if known) Atorvastatin 80mg  tablets daily  2. Which pharmacy/location (including street and city if local pharmacy) is medication to be sent to? [facility] on [address]  3. Do they need a 30 day or 90 day supply? [place]

## 2018-11-07 DIAGNOSIS — R7303 Prediabetes: Secondary | ICD-10-CM | POA: Diagnosis not present

## 2018-11-07 DIAGNOSIS — E785 Hyperlipidemia, unspecified: Secondary | ICD-10-CM | POA: Diagnosis not present

## 2018-11-07 DIAGNOSIS — E039 Hypothyroidism, unspecified: Secondary | ICD-10-CM | POA: Diagnosis not present

## 2018-11-07 DIAGNOSIS — I10 Essential (primary) hypertension: Secondary | ICD-10-CM | POA: Diagnosis not present

## 2018-12-09 DIAGNOSIS — E785 Hyperlipidemia, unspecified: Secondary | ICD-10-CM | POA: Diagnosis not present

## 2018-12-09 DIAGNOSIS — E039 Hypothyroidism, unspecified: Secondary | ICD-10-CM | POA: Diagnosis not present

## 2018-12-09 DIAGNOSIS — R7303 Prediabetes: Secondary | ICD-10-CM | POA: Diagnosis not present

## 2018-12-09 DIAGNOSIS — I10 Essential (primary) hypertension: Secondary | ICD-10-CM | POA: Diagnosis not present

## 2018-12-10 ENCOUNTER — Other Ambulatory Visit: Payer: Self-pay

## 2018-12-10 ENCOUNTER — Ambulatory Visit (INDEPENDENT_AMBULATORY_CARE_PROVIDER_SITE_OTHER): Payer: PPO | Admitting: Cardiology

## 2018-12-10 ENCOUNTER — Encounter: Payer: Self-pay | Admitting: Cardiology

## 2018-12-10 VITALS — BP 124/60 | HR 66 | Ht 70.0 in | Wt 181.4 lb

## 2018-12-10 DIAGNOSIS — E785 Hyperlipidemia, unspecified: Secondary | ICD-10-CM | POA: Diagnosis not present

## 2018-12-10 DIAGNOSIS — R531 Weakness: Secondary | ICD-10-CM

## 2018-12-10 DIAGNOSIS — R5383 Other fatigue: Secondary | ICD-10-CM | POA: Diagnosis not present

## 2018-12-10 DIAGNOSIS — I4729 Other ventricular tachycardia: Secondary | ICD-10-CM

## 2018-12-10 DIAGNOSIS — I472 Ventricular tachycardia: Secondary | ICD-10-CM

## 2018-12-10 DIAGNOSIS — I2510 Atherosclerotic heart disease of native coronary artery without angina pectoris: Secondary | ICD-10-CM

## 2018-12-10 DIAGNOSIS — I255 Ischemic cardiomyopathy: Secondary | ICD-10-CM

## 2018-12-10 MED ORDER — ROSUVASTATIN CALCIUM 20 MG PO TABS: 20.0000 mg | ORAL_TABLET | Freq: Every day | ORAL | 1 refills | Status: DC

## 2018-12-10 NOTE — Progress Notes (Signed)
Cardiology Office Note:    Date:  12/10/2018   ID:  Shane Massey, DOB 1940-06-25, MRN [id number]  PCP:  Maryella Shivers, MD  Cardiologist:  Jenne Campus, MD    Referring MD: Maryella Shivers, MD   Chief Complaint  Patient presents with  . Follow-up  I am still weak and tired  History of Present Illness:    Shane Massey is a 78 y.o. male with coronary disease, history of nonsustained ventricular tachycardia, constellation of very nonspecific symptoms with weakness fatigue and tiredness.  At the same time he is able to walk climb stairs work in the garden with absolutely no difficulties.  He did wear monitor shows short nonsustained ventricular tachycardia he was referred to EP however eventually canceled he is taking as needed.  Since that time he is doing well denies have any chest pain tightness pressure burning the chest just weakness and fatigue.  Apparently years ago he was diagnosed with sleep apnea.  He was treated with all different kind of equipment for about 8 years finally he went to a different physician he was told he does not have sleep apnea he described difficulty sleeping he said he wakes up every hour during the night.  He does have restless leg syndrome, recently he is Lipitor has been stopped for a few weeks and he started feeling better now he is back on Lipitor only half of the maximum strength.  He said he is better but still have some difficulty sleeping. Past Medical History:  Diagnosis Date  . GERD (gastroesophageal reflux disease)   . Hypothyroidism   . OSA (obstructive sleep apnea)     Past Surgical History:  Procedure Laterality Date  . heart stent Left 09/2015    Current Medications: Current Meds  Medication Sig  . aspirin 81 MG tablet Take 81 mg by mouth daily.  Marland Kitchen atorvastatin (LIPITOR) 80 MG tablet Take 1 tablet (80 mg total) by mouth daily.  . calcium carbonate (CALCIUM 600) 600 MG TABS tablet Take 1 tablet by mouth daily.  . clopidogrel  (PLAVIX) 75 MG tablet Take 1 tablet (75 mg total) by mouth daily.  . Coenzyme Q-10 200 MG CAPS Take 200 mg by mouth daily.  . isosorbide mononitrate (IMDUR) 30 MG 24 hr tablet Take 1 tablet (30 mg total) by mouth daily.  Marland Kitchen levothyroxine (SYNTHROID, LEVOTHROID) 50 MCG tablet Take 50 mcg by mouth daily before breakfast.  . Multiple Vitamin (MULTIVITAMIN) tablet Take 1 tablet by mouth daily.  . pantoprazole (PROTONIX) 40 MG tablet Take 40 mg by mouth daily.  . pramipexole (MIRAPEX) 1.5 MG tablet Take 1.5 mg by mouth at bedtime.   . ranitidine (ZANTAC) 150 MG tablet Take 1 tablet (150 mg total) by mouth 2 (two) times daily.  . ranolazine (RANEXA) 500 MG 12 hr tablet Take 1 tablet (500 mg total) by mouth 2 (two) times daily.     Allergies:   Patient has no known allergies.   Social History   Socioeconomic History  . Marital status: Married    Spouse name: Not on file  . Number of children: Not on file  . Years of education: Not on file  . Highest education level: Not on file  Occupational History  . Not on file  Social Needs  . Financial resource strain: Not on file  . Food insecurity    Worry: Not on file    Inability: Not on file  . Transportation needs    Medical: Not  on file    Non-medical: Not on file  Tobacco Use  . Smoking status: Former Smoker    Packs/day: 0.50    Years: 4.00    Pack years: 2.00    Types: Cigarettes    Quit date: 06/11/1958    Years since quitting: 60.5  . Smokeless tobacco: Former Systems developer    Types: [place] date: 12/01/1958  Substance and Sexual Activity  . Alcohol use: No    Alcohol/week: 0.0 standard drinks  . Drug use: No  . Sexual activity: Not on file  Lifestyle  . Physical activity    Days per week: Not on file    Minutes per session: Not on file  . Stress: Not on file  Relationships  . Social Herbalist on phone: Not on file    Gets together: Not on file    Attends religious service: Not on file    Active member of club or  organization: Not on file    Attends meetings of clubs or organizations: Not on file    Relationship status: Not on file  Other Topics Concern  . Not on file  Social History Narrative  . Not on file     Family History: The patient's family history includes Asthma in his brother and maternal grandmother; Heart attack in his brother; Heart disease in his brother; Stroke in his father. ROS:   Please see the history of present illness.    All 14 point review of systems negative except as described per history of present illness  EKGs/Labs/Other Studies Reviewed:      Recent Labs: No results found for requested labs within last 8760 hours.  Recent Lipid Panel No results found for: CHOL, TRIG, HDL, CHOLHDL, VLDL, LDLCALC, LDLDIRECT  Physical Exam:    VS:  BP 124/60   Pulse 66   Ht 5\' 10"  (1.778 m)   Wt 181 lb 6.4 oz (82.3 kg)   SpO2 98%   BMI 26.03 kg/m     Wt Readings from Last 3 Encounters:  12/10/18 181 lb 6.4 oz (82.3 kg)  07/17/18 185 lb 3.2 oz (84 kg)  04/30/18 179 lb 12.8 oz (81.6 kg)     GEN:  Well nourished, well developed in no acute distress HEENT: Normal NECK: No JVD; No carotid bruits LYMPHATICS: No lymphadenopathy CARDIAC: RRR, no murmurs, no rubs, no gallops RESPIRATORY:  Clear to auscultation without rales, wheezing or rhonchi  ABDOMEN: Soft, non-tender, non-distended MUSCULOSKELETAL:  No edema; No deformity  SKIN: Warm and dry LOWER EXTREMITIES: no swelling NEUROLOGIC:  Alert and oriented x 3 PSYCHIATRIC:  Normal affect   ASSESSMENT:    1. Coronary artery disease involving native heart without angina pectoris, unspecified vessel or lesion type   2. Ischemic cardiomyopathy   3. Nonsustained ventricular tachycardia ([employer])   4. Dyslipidemia    PLAN:    In order of problems listed above:  1. Coronary artery disease seems to be doing well from that point of view.  Asymptomatic. 2. Ischemic cardiomyopathy.  Last echocardiogram showed ejection  fraction being preserved. 3. Nonsustained ventricular tachycardia but does not want to have any testing done for it.  He was referred to EP for consult. 4. Dyslipidemia I propose to change medication to Crestor 20 from Lipitor 40.  I hope you be able to sleep better.  However I will be honest this is first time I heard side effect of statin therapy with a sleeping problem. 5.  Constellation of atypical symptoms with weakness and fatigue I will check a complete metabolic panel and K55 vitamin D level.  TSH will be checked as well.   Medication Adjustments/Labs and Tests Ordered: Current medicines are reviewed at length with the patient today.  Concerns regarding medicines are outlined above.  No orders of the defined types were placed in this encounter.  Medication changes: No orders of the defined types were placed in this encounter.   Signed, Park Liter, MD, [facility] 12/10/2018 1:41 PM    [place]

## 2018-12-10 NOTE — Patient Instructions (Signed)
Medication Instructions:  Your physician has recommended you make the following change in your medication:  STOP: Lipitor  START: Crestor 20 mg daily  If you need a refill on your cardiac medications before your next appointment, please call your pharmacy.   Lab work: Your physician recommends that you return for lab work today: cmp, tsh, Vitamin D, B12,  If you have labs (blood work) drawn today and your tests are completely normal, you will receive your results only by: Marland Kitchen MyChart Message (if you have MyChart) OR . A paper copy in the mail If you have any lab test that is abnormal or we need to change your treatment, we will call you to review the results.  Testing/Procedures: None.   Follow-Up: At [facility], you and your health needs are our priority.  As part of our continuing mission to provide you with exceptional heart care, we have created designated Provider Care Teams.  These Care Teams include your primary Cardiologist (physician) and Advanced Practice Providers (APPs -  Physician Assistants and Nurse Practitioners) who all work together to provide you with the care you need, when you need it. You will need a follow up appointment in 3 months.  Please call our office 2 months in advance to schedule this appointment.  You may see Jenne Campus, MD or another member of our [facility] Provider Team in [place]: Shirlee More, MD . Jyl Heinz, MD  Any Other Special Instructions Will Be Listed Below (If Applicable).  Rosuvastatin Tablets What is this medicine? ROSUVASTATIN (roe SOO va sta tin) is known as a HMG-CoA reductase inhibitor or 'statin'. It lowers cholesterol and triglycerides in the blood. This drug may also reduce the risk of heart attack, stroke, or other health problems in patients with risk factors for heart disease. Diet and lifestyle changes are often used with this drug. This medicine may be used for other purposes; ask your health care provider or  pharmacist if you have questions. COMMON BRAND NAME(S): Crestor What should I tell my health care provider before I take this medicine? They need to know if you have any of these conditions:  diabetes  if you often drink alcohol  history of stroke  kidney disease  liver disease  muscle aches or weakness  thyroid disease  an unusual or allergic reaction to rosuvastatin, other medicines, foods, dyes, or preservatives  pregnant or trying to get pregnant  breast-feeding How should I use this medicine? Take this medicine by mouth with a glass of water. Follow the directions on the prescription label. Do not cut, crush or chew this medicine. You can take this medicine with or without food. Take your doses at regular intervals. Do not take your medicine more often than directed. Talk to your pediatrician regarding the use of this medicine in children. While this drug may be prescribed for children as young as 17 years old for selected conditions, precautions do apply. Overdosage: If you think you have taken too much of this medicine contact a poison control center or emergency room at once. NOTE: This medicine is only for you. Do not share this medicine with others. What if I miss a dose? If you miss a dose, take it as soon as you can. If your next dose is to be taken in less than 12 hours, then do not take the missed dose. Take the next dose at your regular time. Do not take double or extra doses. What may interact with this medicine? Do not  take this medicine with any of the following medications:  herbal medicines like red yeast rice This medicine may also interact with the following medications:  alcohol  antacids containing aluminum hydroxide or magnesium hydroxide  cyclosporine  other medicines for high cholesterol  some medicines for HIV infection  warfarin This list may not describe all possible interactions. Give your health care provider a list of all the medicines,  herbs, non-prescription drugs, or dietary supplements you use. Also tell them if you smoke, drink alcohol, or use illegal drugs. Some items may interact with your medicine. What should I watch for while using this medicine? Visit your doctor or health care professional for regular check-ups. You may need regular tests to make sure your liver is working properly. Your health care professional may tell you to stop taking this medicine if you develop muscle problems. If your muscle problems do not go away after stopping this medicine, contact your health care professional. Do not become pregnant while taking this medicine. Women should inform their health care professional if they wish to become pregnant or think they might be pregnant. There is a potential for serious side effects to an unborn child. Talk to your health care professional or pharmacist for more information. Do not breast-feed an infant while taking this medicine. This medicine may increase blood sugar. Ask your healthcare provider if changes in diet or medicines are needed if you have diabetes. If you are going to need surgery or other procedure, tell your doctor that you are using this medicine. This drug is only part of a total heart-health program. Your doctor or a dietician can suggest a low-cholesterol and low-fat diet to help. Avoid alcohol and smoking, and keep a proper exercise schedule. This medicine may cause a decrease in Co-Enzyme Q-10. You should make sure that you get enough Co-Enzyme Q-10 while you are taking this medicine. Discuss the foods you eat and the vitamins you take with your health care professional. What side effects may I notice from receiving this medicine? Side effects that you should report to your doctor or health care professional as soon as possible:  allergic reactions like skin rash, itching or hives, swelling of the face, lips, or tongue  confusion  joint pain  loss of memory  redness, blistering,  peeling or loosening of the skin, including inside the mouth  signs and symptoms of high blood sugar such as being more thirsty or hungry or having to urinate more than normal. You may also feel very tired or have blurry vision.  signs and symptoms of muscle injury like dark urine; trouble passing urine or change in the amount of urine; unusually weak or tired; muscle pain or side or back pain  yellowing of the eyes or skin Side effects that usually do not require medical attention (report to your doctor or health care professional if they continue or are bothersome):  constipation  diarrhea  dizziness  gas  headache  nausea  stomach pain  trouble sleeping  upset stomach This list may not describe all possible side effects. Call your doctor for medical advice about side effects. You may report side effects to FDA at 1-800-FDA-1088. Where should I keep my medicine? Keep out of the reach of children. Store at room temperature between 20 and 25 degrees C (68 and 77 degrees F). Keep container tightly closed (protect from moisture). Throw away any unused medicine after the expiration date. NOTE: This sheet is a summary. It may not cover all  possible information. If you have questions about this medicine, talk to your doctor, pharmacist, or health care provider.  2020 Elsevier/Gold Standard (2018-03-20 08:25:08)

## 2018-12-10 NOTE — Addendum Note (Signed)
Addended by: Ashok Norris on: 12/10/2018 01:55 PM   Modules accepted: Orders

## 2018-12-15 LAB — COMPREHENSIVE METABOLIC PANEL
ALT: 16 IU/L (ref 0–44)
AST: 20 IU/L (ref 0–40)
Albumin/Globulin Ratio: 1.8 (ref 1.2–2.2)
Albumin: 4.4 g/dL (ref 3.7–4.7)
Alkaline Phosphatase: 49 IU/L (ref 39–117)
BUN/Creatinine Ratio: 19 (ref 10–24)
BUN: 16 mg/dL (ref 8–27)
Bilirubin Total: 0.4 mg/dL (ref 0.0–1.2)
CO2: 22 mmol/L (ref 20–29)
Calcium: 9.5 mg/dL (ref 8.6–10.2)
Chloride: 105 mmol/L (ref 96–106)
Creatinine, Ser: 0.84 mg/dL (ref 0.76–1.27)
GFR calc Af Amer: 98 mL/min/{1.73_m2} (ref >59)
GFR calc non Af Amer: 85 mL/min/{1.73_m2} (ref >59)
Globulin, Total: 2.4 g/dL (ref 1.5–4.5)
Glucose: 131 mg/dL — ABNORMAL HIGH (ref 65–99)
Potassium: 4.2 mmol/L (ref 3.5–5.2)
Sodium: 140 mmol/L (ref 134–144)
Total Protein: 6.8 g/dL (ref 6.0–8.5)

## 2018-12-15 LAB — VITAMIN D 1,25 DIHYDROXY
Vitamin D 1, 25 (OH)2 Total: 47 pg/mL
Vitamin D2 1, 25 (OH)2: <10 pg/mL
Vitamin D3 1, 25 (OH)2: 47 pg/mL

## 2018-12-15 LAB — TSH: TSH: 2.02 u[IU]/mL (ref 0.450–4.500)

## 2018-12-15 LAB — VITAMIN B12: Vitamin B-12: 265 pg/mL (ref 232–1245)

## 2019-01-09 DIAGNOSIS — E785 Hyperlipidemia, unspecified: Secondary | ICD-10-CM | POA: Diagnosis not present

## 2019-01-09 DIAGNOSIS — E039 Hypothyroidism, unspecified: Secondary | ICD-10-CM | POA: Diagnosis not present

## 2019-01-09 DIAGNOSIS — I10 Essential (primary) hypertension: Secondary | ICD-10-CM | POA: Diagnosis not present

## 2019-01-09 DIAGNOSIS — R7303 Prediabetes: Secondary | ICD-10-CM | POA: Diagnosis not present

## 2019-01-12 DIAGNOSIS — R200 Anesthesia of skin: Secondary | ICD-10-CM | POA: Diagnosis not present

## 2019-01-12 DIAGNOSIS — Z6827 Body mass index (BMI) 27.0-27.9, adult: Secondary | ICD-10-CM | POA: Diagnosis not present

## 2019-01-13 DIAGNOSIS — R200 Anesthesia of skin: Secondary | ICD-10-CM | POA: Diagnosis not present

## 2019-01-13 DIAGNOSIS — M4180 Other forms of scoliosis, site unspecified: Secondary | ICD-10-CM | POA: Diagnosis not present

## 2019-01-13 DIAGNOSIS — M4316 Spondylolisthesis, lumbar region: Secondary | ICD-10-CM | POA: Diagnosis not present

## 2019-01-13 DIAGNOSIS — M47817 Spondylosis without myelopathy or radiculopathy, lumbosacral region: Secondary | ICD-10-CM | POA: Diagnosis not present

## 2019-01-26 DIAGNOSIS — R200 Anesthesia of skin: Secondary | ICD-10-CM | POA: Diagnosis not present

## 2019-01-26 DIAGNOSIS — R29898 Other symptoms and signs involving the musculoskeletal system: Secondary | ICD-10-CM | POA: Diagnosis not present

## 2019-01-26 DIAGNOSIS — M549 Dorsalgia, unspecified: Secondary | ICD-10-CM | POA: Diagnosis not present

## 2019-01-26 DIAGNOSIS — M47816 Spondylosis without myelopathy or radiculopathy, lumbar region: Secondary | ICD-10-CM | POA: Diagnosis not present

## 2019-01-27 ENCOUNTER — Ambulatory Visit: Payer: PPO | Admitting: Cardiology

## 2019-01-30 DIAGNOSIS — R200 Anesthesia of skin: Secondary | ICD-10-CM | POA: Diagnosis not present

## 2019-01-30 DIAGNOSIS — M545 Low back pain: Secondary | ICD-10-CM | POA: Diagnosis not present

## 2019-02-04 DIAGNOSIS — M25552 Pain in left hip: Secondary | ICD-10-CM | POA: Diagnosis not present

## 2019-02-04 DIAGNOSIS — R200 Anesthesia of skin: Secondary | ICD-10-CM | POA: Diagnosis not present

## 2019-02-04 DIAGNOSIS — Z6827 Body mass index (BMI) 27.0-27.9, adult: Secondary | ICD-10-CM | POA: Diagnosis not present

## 2019-02-04 DIAGNOSIS — M47816 Spondylosis without myelopathy or radiculopathy, lumbar region: Secondary | ICD-10-CM | POA: Diagnosis not present

## 2019-02-09 DIAGNOSIS — E039 Hypothyroidism, unspecified: Secondary | ICD-10-CM | POA: Diagnosis not present

## 2019-02-09 DIAGNOSIS — R7303 Prediabetes: Secondary | ICD-10-CM | POA: Diagnosis not present

## 2019-02-09 DIAGNOSIS — E785 Hyperlipidemia, unspecified: Secondary | ICD-10-CM | POA: Diagnosis not present

## 2019-02-09 DIAGNOSIS — I10 Essential (primary) hypertension: Secondary | ICD-10-CM | POA: Diagnosis not present

## 2019-02-11 DIAGNOSIS — R200 Anesthesia of skin: Secondary | ICD-10-CM | POA: Diagnosis not present

## 2019-02-11 DIAGNOSIS — Z6827 Body mass index (BMI) 27.0-27.9, adult: Secondary | ICD-10-CM | POA: Diagnosis not present

## 2019-02-17 ENCOUNTER — Other Ambulatory Visit: Payer: Self-pay | Admitting: Cardiology

## 2019-02-17 DIAGNOSIS — M545 Low back pain: Secondary | ICD-10-CM | POA: Diagnosis not present

## 2019-02-17 DIAGNOSIS — M25652 Stiffness of left hip, not elsewhere classified: Secondary | ICD-10-CM | POA: Diagnosis not present

## 2019-02-23 DIAGNOSIS — M545 Low back pain: Secondary | ICD-10-CM | POA: Diagnosis not present

## 2019-02-23 DIAGNOSIS — M25652 Stiffness of left hip, not elsewhere classified: Secondary | ICD-10-CM | POA: Diagnosis not present

## 2019-02-24 ENCOUNTER — Other Ambulatory Visit: Payer: Self-pay | Admitting: Cardiology

## 2019-02-25 DIAGNOSIS — M25652 Stiffness of left hip, not elsewhere classified: Secondary | ICD-10-CM | POA: Diagnosis not present

## 2019-02-25 DIAGNOSIS — M545 Low back pain: Secondary | ICD-10-CM | POA: Diagnosis not present

## 2019-03-02 ENCOUNTER — Other Ambulatory Visit: Payer: Self-pay

## 2019-03-02 ENCOUNTER — Ambulatory Visit (INDEPENDENT_AMBULATORY_CARE_PROVIDER_SITE_OTHER): Payer: PPO | Admitting: Cardiology

## 2019-03-02 VITALS — BP 120/66 | HR 74 | Ht 70.0 in | Wt 181.0 lb

## 2019-03-02 DIAGNOSIS — R0609 Other forms of dyspnea: Secondary | ICD-10-CM

## 2019-03-02 DIAGNOSIS — I4729 Other ventricular tachycardia: Secondary | ICD-10-CM

## 2019-03-02 DIAGNOSIS — M545 Low back pain: Secondary | ICD-10-CM | POA: Diagnosis not present

## 2019-03-02 DIAGNOSIS — I2510 Atherosclerotic heart disease of native coronary artery without angina pectoris: Secondary | ICD-10-CM

## 2019-03-02 DIAGNOSIS — E785 Hyperlipidemia, unspecified: Secondary | ICD-10-CM | POA: Diagnosis not present

## 2019-03-02 DIAGNOSIS — I472 Ventricular tachycardia: Secondary | ICD-10-CM

## 2019-03-02 DIAGNOSIS — I255 Ischemic cardiomyopathy: Secondary | ICD-10-CM | POA: Diagnosis not present

## 2019-03-02 DIAGNOSIS — R0600 Dyspnea, unspecified: Secondary | ICD-10-CM

## 2019-03-02 DIAGNOSIS — M25652 Stiffness of left hip, not elsewhere classified: Secondary | ICD-10-CM | POA: Diagnosis not present

## 2019-03-02 NOTE — Progress Notes (Signed)
Cardiology Office Note:    Date:  03/02/2019   ID:  Shane Massey, DOB 1941/04/12, MRN [id number]  PCP:  Maryella Shivers, MD  Cardiologist:  Jenne Campus, MD    Referring MD: Maryella Shivers, MD   Chief Complaint  Patient presents with  . Follow-up  Doing well  History of Present Illness:    Shane Massey is a 78 y.o. male with cardiomyopathy with normal ejection fraction, coronary artery disease, dyslipidemia comes today to my office for follow-up overall doing very well busy doing a lot of stuff.  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  He said he is getting a little more tired than before but overall doing well.  Last time when I spoke to him to complain of being weak tired exhausted we did a lot of test all were negative.  He also got Holter monitor which showed some nonsustained ventricular tachycardia.  I talked to him about potentially referral to EP but he declined.  Doing well no dizziness no passing out  Past Medical History:  Diagnosis Date  . GERD (gastroesophageal reflux disease)   . Hypothyroidism   . OSA (obstructive sleep apnea)     Past Surgical History:  Procedure Laterality Date  . heart stent Left 09/2015    Current Medications: Current Meds  Medication Sig  . aspirin 81 MG tablet Take 81 mg by mouth daily.  Marland Kitchen atorvastatin (LIPITOR) 80 MG tablet Take 80 mg by mouth daily.  . calcium carbonate (CALCIUM 600) 600 MG TABS tablet Take 1 tablet by mouth daily.  . clopidogrel (PLAVIX) 75 MG tablet TAKE 1 TABLET BY MOUTH ONCE DAILY.  Marland Kitchen Coenzyme Q-10 200 MG CAPS Take 200 mg by mouth daily.  . Ergocalciferol (VITAMIN D2) 10 MCG (400 UNIT) TABS Take 800 Units by mouth daily.  . isosorbide mononitrate (IMDUR) 30 MG 24 hr tablet Take 1 tablet (30 mg total) by mouth daily.  Marland Kitchen levothyroxine (SYNTHROID, LEVOTHROID) 50 MCG tablet Take 50 mcg by mouth daily before breakfast.  . Multiple Vitamin (MULTIVITAMIN) tablet Take 1 tablet by mouth  daily.  . pantoprazole (PROTONIX) 40 MG tablet Take 40 mg by mouth daily.  . pramipexole (MIRAPEX) 1.5 MG tablet Take 1.5 mg by mouth at bedtime.      Allergies:   Patient has no known allergies.   Social History   Socioeconomic History  . Marital status: Married    Spouse name: Not on file  . Number of children: Not on file  . Years of education: Not on file  . Highest education level: Not on file  Occupational History  . Not on file  Social Needs  . Financial resource strain: Not on file  . Food insecurity    Worry: Not on file    Inability: Not on file  . Transportation needs    Medical: Not on file    Non-medical: Not on file  Tobacco Use  . Smoking status: Former Smoker    Packs/day: 0.50    Years: 4.00    Pack years: 2.00    Types: Cigarettes    Quit date: 06/11/1958    Years since quitting: 60.7  . Smokeless tobacco: Former Systems developer    Types: [place] date: 12/01/1958  Substance and Sexual Activity  . Alcohol use: No    Alcohol/week: 0.0 standard drinks  . Drug use: No  . Sexual activity: Not on file  Lifestyle  . Physical activity  Days per week: Not on file    Minutes per session: Not on file  . Stress: Not on file  Relationships  . Social Herbalist on phone: Not on file    Gets together: Not on file    Attends religious service: Not on file    Active member of club or organization: Not on file    Attends meetings of clubs or organizations: Not on file    Relationship status: Not on file  Other Topics Concern  . Not on file  Social History Narrative  . Not on file     Family History: The patient's family history includes Asthma in his brother and maternal grandmother; Heart attack in his brother; Heart disease in his brother; Stroke in his father. ROS:   Please see the history of present illness.    All 14 point review of systems negative except as described per history of present illness  EKGs/Labs/Other Studies Reviewed:       Recent Labs: 12/10/2018: ALT 16; BUN 16; Creatinine, Ser 0.84; Potassium 4.2; Sodium 140; TSH 2.020  Recent Lipid Panel No results found for: CHOL, TRIG, HDL, CHOLHDL, VLDL, LDLCALC, LDLDIRECT  Physical Exam:    VS:  BP 120/66   Pulse 74   Ht 5\' 10"  (1.778 m)   Wt 181 lb (82.1 kg)   SpO2 98%   BMI 25.97 kg/m     Wt Readings from Last 3 Encounters:  03/02/19 181 lb (82.1 kg)  12/10/18 181 lb 6.4 oz (82.3 kg)  07/17/18 185 lb 3.2 oz (84 kg)     GEN:  Well nourished, well developed in no acute distress HEENT: Normal NECK: No JVD; No carotid bruits LYMPHATICS: No lymphadenopathy CARDIAC: RRR, no murmurs, no rubs, no gallops RESPIRATORY:  Clear to auscultation without rales, wheezing or rhonchi  ABDOMEN: Soft, non-tender, non-distended MUSCULOSKELETAL:  No edema; No deformity  SKIN: Warm and dry LOWER EXTREMITIES: no swelling NEUROLOGIC:  Alert and oriented x 3 PSYCHIATRIC:  Normal affect   ASSESSMENT:    1. Ischemic cardiomyopathy   2. Coronary artery disease involving native heart without angina pectoris, unspecified vessel or lesion type   3. Dyspnea on exertion   4. Dyslipidemia   5. Nonsustained ventricular tachycardia (HCC)    PLAN:    In order of problems listed above:  1. Ischemic cardiomyopathy doing well on appropriate medications which I will continue 2. Dyspnea on exertion.  Stable 3. Dyslipidemia he did not have fasting lipid profile done by his primary care physician.  He is taking atorvastatin 80 and able to tolerate this 4. Nonsustained ventricular tachycardia does not want investigated   Medication Adjustments/Labs and Tests Ordered: Current medicines are reviewed at length with the patient today.  Concerns regarding medicines are outlined above.  No orders of the defined types were placed in this encounter.  Medication changes: No orders of the defined types were placed in this encounter.   Signed, Park Liter, MD, [facility] 03/02/2019 4:06  PM    [facility]

## 2019-03-02 NOTE — Patient Instructions (Signed)
Medication Instructions:  .instucr  If you need a refill on your cardiac medications before your next appointment, please call your pharmacy.   Lab work: None.  If you have labs (blood work) drawn today and your tests are completely normal, you will receive your results only by: Marland Kitchen MyChart Message (if you have MyChart) OR . A paper copy in the mail If you have any lab test that is abnormal or we need to change your treatment, we will call you to review the results.  Testing/Procedures: None.   Follow-Up: At [facility], you and your health needs are our priority.  As part of our continuing mission to provide you with exceptional heart care, we have created designated Provider Care Teams.  These Care Teams include your primary Cardiologist (physician) and Advanced Practice Providers (APPs -  Physician Assistants and Nurse Practitioners) who all work together to provide you with the care you need, when you need it. You will need a follow up appointment in 5 months.  Please call our office 2 months in advance to schedule this appointment.  You may see Jenne Campus, MD or another member of our [facility] Provider Team in [place]: Shirlee More, MD . Jyl Heinz, MD  Any Other Special Instructions Will Be Listed Below (If Applicable).

## 2019-03-03 ENCOUNTER — Encounter: Payer: Self-pay | Admitting: Cardiology

## 2019-03-04 DIAGNOSIS — M25652 Stiffness of left hip, not elsewhere classified: Secondary | ICD-10-CM | POA: Diagnosis not present

## 2019-03-04 DIAGNOSIS — M545 Low back pain: Secondary | ICD-10-CM | POA: Diagnosis not present

## 2019-03-05 ENCOUNTER — Other Ambulatory Visit: Payer: Self-pay | Admitting: Cardiology

## 2019-03-09 DIAGNOSIS — M545 Low back pain: Secondary | ICD-10-CM | POA: Diagnosis not present

## 2019-03-09 DIAGNOSIS — M25652 Stiffness of left hip, not elsewhere classified: Secondary | ICD-10-CM | POA: Diagnosis not present

## 2019-03-11 DIAGNOSIS — E785 Hyperlipidemia, unspecified: Secondary | ICD-10-CM | POA: Diagnosis not present

## 2019-03-11 DIAGNOSIS — E039 Hypothyroidism, unspecified: Secondary | ICD-10-CM | POA: Diagnosis not present

## 2019-03-11 DIAGNOSIS — R7303 Prediabetes: Secondary | ICD-10-CM | POA: Diagnosis not present

## 2019-03-11 DIAGNOSIS — I10 Essential (primary) hypertension: Secondary | ICD-10-CM | POA: Diagnosis not present

## 2019-03-11 DIAGNOSIS — M25652 Stiffness of left hip, not elsewhere classified: Secondary | ICD-10-CM | POA: Diagnosis not present

## 2019-03-11 DIAGNOSIS — M545 Low back pain: Secondary | ICD-10-CM | POA: Diagnosis not present

## 2019-03-12 DIAGNOSIS — Z125 Encounter for screening for malignant neoplasm of prostate: Secondary | ICD-10-CM | POA: Diagnosis not present

## 2019-03-12 DIAGNOSIS — R7303 Prediabetes: Secondary | ICD-10-CM | POA: Diagnosis not present

## 2019-03-12 DIAGNOSIS — I10 Essential (primary) hypertension: Secondary | ICD-10-CM | POA: Diagnosis not present

## 2019-03-12 DIAGNOSIS — E785 Hyperlipidemia, unspecified: Secondary | ICD-10-CM | POA: Diagnosis not present

## 2019-03-12 DIAGNOSIS — E039 Hypothyroidism, unspecified: Secondary | ICD-10-CM | POA: Diagnosis not present

## 2019-03-13 ENCOUNTER — Other Ambulatory Visit: Payer: Self-pay | Admitting: Cardiology

## 2019-03-17 ENCOUNTER — Other Ambulatory Visit: Payer: Self-pay | Admitting: Cardiology

## 2019-03-17 DIAGNOSIS — M25652 Stiffness of left hip, not elsewhere classified: Secondary | ICD-10-CM | POA: Diagnosis not present

## 2019-03-17 DIAGNOSIS — M545 Low back pain: Secondary | ICD-10-CM | POA: Diagnosis not present

## 2019-03-18 ENCOUNTER — Telehealth: Payer: Self-pay | Admitting: Cardiology

## 2019-03-18 MED ORDER — ROSUVASTATIN CALCIUM 20 MG PO TABS: 20.0000 mg | ORAL_TABLET | Freq: Every day | ORAL | 1 refills | Status: DC

## 2019-03-18 NOTE — Telephone Encounter (Signed)
Called patient to verify refill request. Crestor was not on patients chart as taking. However after confirming with the patient's wife per the patient's verbal permission she informed me that she didn't get to come with the patient to his last appointment in September which was when crestor was documented as not taking, she reports the patients gets confused on his medication sometimes and he has in fact been taking crestor 20 mg daily and hasn't taken atorvastatin since July when this was initially switched by Dr. Fraser Din. I will update this in chart and refill crestor 20 mg daily. Patient's wife verbally understands and there are no further questions.

## 2019-03-18 NOTE — Telephone Encounter (Signed)
°*  STAT* If patient is at the pharmacy, call can be transferred to refill team.   1. Which medications need to be refilled? (please list name of each medication and dose if known) Rosuvastatin 20mg  takes 1 daily   2. Which pharmacy/location (including street and city if local pharmacy) is medication to be sent to?[place] [facility]  3. Do they need a 30 day or 90 day supply? 90   He is out completely!!!

## 2019-03-18 NOTE — Addendum Note (Signed)
Addended by: Ashok Norris on: 03/18/2019 01:32 PM   Modules accepted: Orders

## 2019-03-19 DIAGNOSIS — M25652 Stiffness of left hip, not elsewhere classified: Secondary | ICD-10-CM | POA: Diagnosis not present

## 2019-03-19 DIAGNOSIS — M545 Low back pain: Secondary | ICD-10-CM | POA: Diagnosis not present

## 2019-03-20 DIAGNOSIS — E039 Hypothyroidism, unspecified: Secondary | ICD-10-CM | POA: Diagnosis not present

## 2019-03-20 DIAGNOSIS — E785 Hyperlipidemia, unspecified: Secondary | ICD-10-CM | POA: Diagnosis not present

## 2019-03-20 DIAGNOSIS — R7303 Prediabetes: Secondary | ICD-10-CM | POA: Diagnosis not present

## 2019-03-20 DIAGNOSIS — Z23 Encounter for immunization: Secondary | ICD-10-CM | POA: Diagnosis not present

## 2019-03-20 DIAGNOSIS — I10 Essential (primary) hypertension: Secondary | ICD-10-CM | POA: Diagnosis not present

## 2019-03-23 DIAGNOSIS — M545 Low back pain: Secondary | ICD-10-CM | POA: Diagnosis not present

## 2019-03-23 DIAGNOSIS — M25652 Stiffness of left hip, not elsewhere classified: Secondary | ICD-10-CM | POA: Diagnosis not present

## 2019-03-25 DIAGNOSIS — M25652 Stiffness of left hip, not elsewhere classified: Secondary | ICD-10-CM | POA: Diagnosis not present

## 2019-03-25 DIAGNOSIS — M545 Low back pain: Secondary | ICD-10-CM | POA: Diagnosis not present

## 2019-03-30 DIAGNOSIS — L570 Actinic keratosis: Secondary | ICD-10-CM | POA: Diagnosis not present

## 2019-03-30 DIAGNOSIS — M25652 Stiffness of left hip, not elsewhere classified: Secondary | ICD-10-CM | POA: Diagnosis not present

## 2019-03-30 DIAGNOSIS — M545 Low back pain: Secondary | ICD-10-CM | POA: Diagnosis not present

## 2019-03-30 DIAGNOSIS — C44622 Squamous cell carcinoma of skin of right upper limb, including shoulder: Secondary | ICD-10-CM | POA: Diagnosis not present

## 2019-04-01 DIAGNOSIS — M25652 Stiffness of left hip, not elsewhere classified: Secondary | ICD-10-CM | POA: Diagnosis not present

## 2019-04-01 DIAGNOSIS — M545 Low back pain: Secondary | ICD-10-CM | POA: Diagnosis not present

## 2019-04-06 DIAGNOSIS — M25652 Stiffness of left hip, not elsewhere classified: Secondary | ICD-10-CM | POA: Diagnosis not present

## 2019-04-06 DIAGNOSIS — M545 Low back pain: Secondary | ICD-10-CM | POA: Diagnosis not present

## 2019-04-08 DIAGNOSIS — M545 Low back pain: Secondary | ICD-10-CM | POA: Diagnosis not present

## 2019-04-08 DIAGNOSIS — M25652 Stiffness of left hip, not elsewhere classified: Secondary | ICD-10-CM | POA: Diagnosis not present

## 2019-04-10 DIAGNOSIS — E039 Hypothyroidism, unspecified: Secondary | ICD-10-CM | POA: Diagnosis not present

## 2019-04-10 DIAGNOSIS — E785 Hyperlipidemia, unspecified: Secondary | ICD-10-CM | POA: Diagnosis not present

## 2019-04-10 DIAGNOSIS — R7303 Prediabetes: Secondary | ICD-10-CM | POA: Diagnosis not present

## 2019-04-10 DIAGNOSIS — I10 Essential (primary) hypertension: Secondary | ICD-10-CM | POA: Diagnosis not present

## 2019-04-13 DIAGNOSIS — M25652 Stiffness of left hip, not elsewhere classified: Secondary | ICD-10-CM | POA: Diagnosis not present

## 2019-04-13 DIAGNOSIS — M545 Low back pain: Secondary | ICD-10-CM | POA: Diagnosis not present

## 2019-04-27 DIAGNOSIS — M545 Low back pain: Secondary | ICD-10-CM | POA: Diagnosis not present

## 2019-04-27 DIAGNOSIS — M25652 Stiffness of left hip, not elsewhere classified: Secondary | ICD-10-CM | POA: Diagnosis not present

## 2019-05-06 DIAGNOSIS — M545 Low back pain: Secondary | ICD-10-CM | POA: Diagnosis not present

## 2019-05-06 DIAGNOSIS — M25652 Stiffness of left hip, not elsewhere classified: Secondary | ICD-10-CM | POA: Diagnosis not present

## 2019-05-11 DIAGNOSIS — E039 Hypothyroidism, unspecified: Secondary | ICD-10-CM | POA: Diagnosis not present

## 2019-05-11 DIAGNOSIS — R7303 Prediabetes: Secondary | ICD-10-CM | POA: Diagnosis not present

## 2019-05-11 DIAGNOSIS — M25652 Stiffness of left hip, not elsewhere classified: Secondary | ICD-10-CM | POA: Diagnosis not present

## 2019-05-11 DIAGNOSIS — M545 Low back pain: Secondary | ICD-10-CM | POA: Diagnosis not present

## 2019-05-11 DIAGNOSIS — E785 Hyperlipidemia, unspecified: Secondary | ICD-10-CM | POA: Diagnosis not present

## 2019-05-11 DIAGNOSIS — I10 Essential (primary) hypertension: Secondary | ICD-10-CM | POA: Diagnosis not present

## 2019-05-18 ENCOUNTER — Telehealth: Payer: Self-pay | Admitting: Cardiology

## 2019-05-18 MED ORDER — CLOPIDOGREL BISULFATE 75 MG PO TABS: 75.0000 mg | ORAL_TABLET | Freq: Every day | ORAL | 1 refills | Status: DC

## 2019-05-18 NOTE — Addendum Note (Signed)
Addended by: Ashok Norris on: 05/18/2019 10:27 AM   Modules accepted: Orders

## 2019-05-18 NOTE — Telephone Encounter (Signed)
°*  STAT* If patient is at the pharmacy, call can be transferred to refill team.   1. Which medications need to be refilled? (please list name of each medication and dose if known) Clopidigrel 2. Which pharmacy/location (including street and city if local pharmacy) is medication to be sent to? [facility] In [place]   3. Do they need a 30 day or 90 day supply? 90   They are changing pharmacies

## 2019-05-18 NOTE — Telephone Encounter (Signed)
Plavix 75 mg daily refilled.  

## 2019-06-01 DIAGNOSIS — Z6827 Body mass index (BMI) 27.0-27.9, adult: Secondary | ICD-10-CM | POA: Diagnosis not present

## 2019-06-01 DIAGNOSIS — M25561 Pain in right knee: Secondary | ICD-10-CM | POA: Diagnosis not present

## 2019-06-01 DIAGNOSIS — M25569 Pain in unspecified knee: Secondary | ICD-10-CM | POA: Diagnosis not present

## 2019-06-10 DIAGNOSIS — E039 Hypothyroidism, unspecified: Secondary | ICD-10-CM | POA: Diagnosis not present

## 2019-06-10 DIAGNOSIS — E785 Hyperlipidemia, unspecified: Secondary | ICD-10-CM | POA: Diagnosis not present

## 2019-06-10 DIAGNOSIS — R7303 Prediabetes: Secondary | ICD-10-CM | POA: Diagnosis not present

## 2019-06-10 DIAGNOSIS — I10 Essential (primary) hypertension: Secondary | ICD-10-CM | POA: Diagnosis not present

## 2019-06-15 ENCOUNTER — Telehealth: Payer: Self-pay | Admitting: Cardiology

## 2019-06-15 ENCOUNTER — Other Ambulatory Visit: Payer: Self-pay | Admitting: *Deleted

## 2019-06-15 MED ORDER — ROSUVASTATIN CALCIUM 20 MG PO TABS: 20.0000 mg | ORAL_TABLET | Freq: Every day | ORAL | 1 refills | Status: DC

## 2019-06-15 NOTE — Telephone Encounter (Signed)
Refill sent to [facility]

## 2019-06-15 NOTE — Telephone Encounter (Signed)
*  STAT* If patient is at the pharmacy, call can be transferred to refill team.   1. Which medications need to be refilled? (please list name of each medication and dose if known) Rouvastatin  2. Which pharmacy/location (including street and city if local pharmacy) is medication to be sent to?90  3. Do they need a 30 day or 90 day supply? [facility] in [place]

## 2019-06-29 DIAGNOSIS — Z6828 Body mass index (BMI) 28.0-28.9, adult: Secondary | ICD-10-CM | POA: Diagnosis not present

## 2019-06-29 DIAGNOSIS — M25561 Pain in right knee: Secondary | ICD-10-CM | POA: Diagnosis not present

## 2019-07-10 DIAGNOSIS — R7303 Prediabetes: Secondary | ICD-10-CM | POA: Diagnosis not present

## 2019-07-10 DIAGNOSIS — E785 Hyperlipidemia, unspecified: Secondary | ICD-10-CM | POA: Diagnosis not present

## 2019-07-10 DIAGNOSIS — I10 Essential (primary) hypertension: Secondary | ICD-10-CM | POA: Diagnosis not present

## 2019-07-10 DIAGNOSIS — E039 Hypothyroidism, unspecified: Secondary | ICD-10-CM | POA: Diagnosis not present

## 2019-07-24 DIAGNOSIS — Z6828 Body mass index (BMI) 28.0-28.9, adult: Secondary | ICD-10-CM | POA: Diagnosis not present

## 2019-07-24 DIAGNOSIS — K219 Gastro-esophageal reflux disease without esophagitis: Secondary | ICD-10-CM | POA: Diagnosis not present

## 2019-07-31 ENCOUNTER — Encounter: Payer: Self-pay | Admitting: Cardiology

## 2019-07-31 ENCOUNTER — Ambulatory Visit (INDEPENDENT_AMBULATORY_CARE_PROVIDER_SITE_OTHER): Payer: PPO | Admitting: Cardiology

## 2019-07-31 ENCOUNTER — Other Ambulatory Visit: Payer: Self-pay

## 2019-07-31 VITALS — BP 116/58 | HR 59 | Temp 98.0°F | Ht 70.0 in | Wt 188.6 lb

## 2019-07-31 DIAGNOSIS — E785 Hyperlipidemia, unspecified: Secondary | ICD-10-CM | POA: Diagnosis not present

## 2019-07-31 DIAGNOSIS — I472 Ventricular tachycardia: Secondary | ICD-10-CM | POA: Diagnosis not present

## 2019-07-31 DIAGNOSIS — I2510 Atherosclerotic heart disease of native coronary artery without angina pectoris: Secondary | ICD-10-CM

## 2019-07-31 DIAGNOSIS — I4729 Other ventricular tachycardia: Secondary | ICD-10-CM

## 2019-07-31 DIAGNOSIS — I209 Angina pectoris, unspecified: Secondary | ICD-10-CM

## 2019-07-31 DIAGNOSIS — R0602 Shortness of breath: Secondary | ICD-10-CM | POA: Diagnosis not present

## 2019-07-31 DIAGNOSIS — I255 Ischemic cardiomyopathy: Secondary | ICD-10-CM

## 2019-07-31 DIAGNOSIS — R0609 Other forms of dyspnea: Secondary | ICD-10-CM

## 2019-07-31 DIAGNOSIS — R918 Other nonspecific abnormal finding of lung field: Secondary | ICD-10-CM | POA: Diagnosis not present

## 2019-07-31 DIAGNOSIS — R0600 Dyspnea, unspecified: Secondary | ICD-10-CM

## 2019-07-31 DIAGNOSIS — Z01810 Encounter for preprocedural cardiovascular examination: Secondary | ICD-10-CM | POA: Diagnosis not present

## 2019-07-31 DIAGNOSIS — I700 Atherosclerosis of aorta: Secondary | ICD-10-CM | POA: Diagnosis not present

## 2019-07-31 NOTE — Progress Notes (Signed)
Cardiology Office Note:    Date:  07/31/2019   ID:  Shane Massey, DOB 12-Oct-1940, MRN [id number]  PCP:  Maryella Shivers, MD  Cardiologist:  Jenne Campus, MD    Referring MD: Maryella Shivers, MD   No chief complaint on file. Doing well  History of Present Illness:    Shane Massey is a 79 y.o. male with past medical history significant for coronary artery disease, status post PTCA and stenting to mid LAD March 2017, also history of ischemic cardiomyopathy however latest estimation of left ventricle ejection fraction was normal in January 2020.  He comes today to me for follow-up.  He complains of being profoundly weak tired and exhausted.  This is going on especially for last few months.  Walking outside to get him very tired.  He is to be very active and to do a lot over now he can actually because sensation of fatigue tiredness shortness of breath ths. we talked in length about what to do with the situation and honestly I think we reached the point that cardiac catheterization would be the best option.  I spoke to him as to his wife and explained this to him.  He is willing to proceed.  Another contributing factor is the issue of nonsustained ventricular tachycardia has been kept on the monitor.  He never wanted to go to see EP team, however, as a part of stratification we should rule out significant coronary artery disease.  Past Medical History:  Diagnosis Date  . GERD (gastroesophageal reflux disease)   . Hypothyroidism   . OSA (obstructive sleep apnea)     Past Surgical History:  Procedure Laterality Date  . heart stent Left 09/2015    Current Medications: Current Meds  Medication Sig  . aspirin 81 MG tablet Take 81 mg by mouth daily.  . calcium carbonate (CALCIUM 600) 600 MG TABS tablet Take 1 tablet by mouth daily.  . clopidogrel (PLAVIX) 75 MG tablet Take 1 tablet (75 mg total) by mouth daily.  . Coenzyme Q-10 200 MG CAPS Take 200 mg by mouth daily.  .  Ergocalciferol (VITAMIN D2) 10 MCG (400 UNIT) TABS Take 800 Units by mouth daily.  . isosorbide mononitrate (IMDUR) 30 MG 24 hr tablet Take 1 tablet (30 mg total) by mouth daily.  Marland Kitchen levothyroxine (SYNTHROID, LEVOTHROID) 50 MCG tablet Take 50 mcg by mouth daily before breakfast.  . Multiple Vitamin (MULTIVITAMIN) tablet Take 1 tablet by mouth daily.  . pantoprazole (PROTONIX) 40 MG tablet Take 40 mg by mouth daily.  . pramipexole (MIRAPEX) 1.5 MG tablet Take 1.5 mg by mouth at bedtime.   . rosuvastatin (CRESTOR) 20 MG tablet Take 1 tablet (20 mg total) by mouth daily.     Allergies:   Patient has no known allergies.   Social History   Socioeconomic History  . Marital status: Married    Spouse name: Not on file  . Number of children: Not on file  . Years of education: Not on file  . Highest education level: Not on file  Occupational History  . Not on file  Tobacco Use  . Smoking status: Former Smoker    Packs/day: 0.50    Years: 4.00    Pack years: 2.00    Types: Cigarettes    Quit date: 06/11/1958    Years since quitting: 61.1  . Smokeless tobacco: Former Systems developer    Types: [place] date: 12/01/1958  Substance and Sexual Activity  .  Alcohol use: No    Alcohol/week: 0.0 standard drinks  . Drug use: No  . Sexual activity: Not on file  Other Topics Concern  . Not on file  Social History Narrative  . Not on file   Social Determinants of Health   Financial Resource Strain:   . Difficulty of Paying Living Expenses: Not on file  Food Insecurity:   . Worried About Charity fundraiser in the Last Year: Not on file  . Ran Out of Food in the Last Year: Not on file  Transportation Needs:   . Lack of Transportation (Medical): Not on file  . Lack of Transportation (Non-Medical): Not on file  Physical Activity:   . Days of Exercise per Week: Not on file  . Minutes of Exercise per Session: Not on file  Stress:   . Feeling of Stress : Not on file  Social Connections:   .  Frequency of Communication with Friends and Family: Not on file  . Frequency of Social Gatherings with Friends and Family: Not on file  . Attends Religious Services: Not on file  . Active Member of Clubs or Organizations: Not on file  . Attends Archivist Meetings: Not on file  . Marital Status: Not on file     Family History: The patient's family history includes Asthma in his brother and maternal grandmother; Heart attack in his brother; Heart disease in his brother; Stroke in his father. ROS:   Please see the history of present illness.    All 14 point review of systems negative except as described per history of present illness  EKGs/Labs/Other Studies Reviewed:      Recent Labs: 12/10/2018: ALT 16; BUN 16; Creatinine, Ser 0.84; Potassium 4.2; Sodium 140; TSH 2.020  Recent Lipid Panel No results found for: CHOL, TRIG, HDL, CHOLHDL, VLDL, LDLCALC, LDLDIRECT  Physical Exam:    VS:  BP (!) 116/58   Pulse (!) 59   Temp 98 F (36.7 C)   Ht 5\' 10"  (1.778 m)   Wt 188 lb 9.6 oz (85.5 kg)   SpO2 98%   BMI 27.06 kg/m     Wt Readings from Last 3 Encounters:  07/31/19 188 lb 9.6 oz (85.5 kg)  03/02/19 181 lb (82.1 kg)  12/10/18 181 lb 6.4 oz (82.3 kg)     GEN:  Well nourished, well developed in no acute distress HEENT: Normal NECK: No JVD; No carotid bruits LYMPHATICS: No lymphadenopathy CARDIAC: RRR, no murmurs, no rubs, no gallops RESPIRATORY:  Clear to auscultation without rales, wheezing or rhonchi  ABDOMEN: Soft, non-tender, non-distended MUSCULOSKELETAL:  No edema; No deformity  SKIN: Warm and dry LOWER EXTREMITIES: no swelling NEUROLOGIC:  Alert and oriented x 3 PSYCHIATRIC:  Normal affect   ASSESSMENT:    1. Coronary artery disease involving native heart without angina pectoris, unspecified vessel or lesion type   2. Ischemic cardiomyopathy   3. Nonsustained ventricular tachycardia ([place])   4. Angina pectoris ([place])   5. Dyspnea on exertion   6.  Dyslipidemia    PLAN:    In order of problems listed above:  1. Coronary disease status post PCI stenting to mid LAD in 2017.  Now with constellation of symptoms that may be very worried.  I will continue present management.  He will be scheduled to have a cardiac catheterization.  Procedure was explained to him including all risk benefits as well as alternatives. 2. Nonsustained V. tach.  Again as a part of stratification  he will have cardiac catheterization done.  He did not want to see EP team. 3. Dyspnea on exertion will do cardiac catheterization. 4. Dyslipidemia he is on Crestor 20 which I will continue.  Will call primary care physician with fasting lipid profile   Medication Adjustments/Labs and Tests Ordered: Current medicines are reviewed at length with the patient today.  Concerns regarding medicines are outlined above.  No orders of the defined types were placed in this encounter.  Medication changes: No orders of the defined types were placed in this encounter.   Signed, Park Liter, MD, [facility] 07/31/2019 2:11 PM    [place]

## 2019-07-31 NOTE — Progress Notes (Signed)
am

## 2019-07-31 NOTE — H&P (View-Only) (Signed)
Cardiology Office Note:    Date:  07/31/2019   ID:  Shane Massey, DOB [birth year], MRN [id number]  PCP:  Maryella Shivers, MD  Cardiologist:  Jenne Campus, MD    Referring MD: Maryella Shivers, MD   No chief complaint on file. Doing well  History of Present Illness:    Shane Massey is a 79 y.o. male with past medical history significant for coronary artery disease, status post PTCA and stenting to mid LAD March 2017, also history of ischemic cardiomyopathy however latest estimation of left ventricle ejection fraction was normal in January 2020.  He comes today to me for follow-up.  He complains of being profoundly weak tired and exhausted.  This is going on especially for last few months.  Walking outside to get him very tired.  He is to be very active and to do a lot over now he can actually because sensation of fatigue tiredness shortness of breath ths. we talked in length about what to do with the situation and honestly I think we reached the point that cardiac catheterization would be the best option.  I spoke to him as to his wife and explained this to him.  He is willing to proceed.  Another contributing factor is the issue of nonsustained ventricular tachycardia has been kept on the monitor.  He never wanted to go to see EP team, however, as a part of stratification we should rule out significant coronary artery disease.  Past Medical History:  Diagnosis Date  . GERD (gastroesophageal reflux disease)   . Hypothyroidism   . OSA (obstructive sleep apnea)     Past Surgical History:  Procedure Laterality Date  . heart stent Left 09/2015    Current Medications: Current Meds  Medication Sig  . aspirin 81 MG tablet Take 81 mg by mouth daily.  . calcium carbonate (CALCIUM 600) 600 MG TABS tablet Take 1 tablet by mouth daily.  . clopidogrel (PLAVIX) 75 MG tablet Take 1 tablet (75 mg total) by mouth daily.  . Coenzyme Q-10 200 MG CAPS Take 200 mg by mouth daily.  .  Ergocalciferol (VITAMIN D2) 10 MCG (400 UNIT) TABS Take 800 Units by mouth daily.  . isosorbide mononitrate (IMDUR) 30 MG 24 hr tablet Take 1 tablet (30 mg total) by mouth daily.  Marland Kitchen levothyroxine (SYNTHROID, LEVOTHROID) 50 MCG tablet Take 50 mcg by mouth daily before breakfast.  . Multiple Vitamin (MULTIVITAMIN) tablet Take 1 tablet by mouth daily.  . pantoprazole (PROTONIX) 40 MG tablet Take 40 mg by mouth daily.  . pramipexole (MIRAPEX) 1.5 MG tablet Take 1.5 mg by mouth at bedtime.   . rosuvastatin (CRESTOR) 20 MG tablet Take 1 tablet (20 mg total) by mouth daily.     Allergies:   Patient has no known allergies.   Social History   Socioeconomic History  . Marital status: Married    Spouse name: Not on file  . Number of children: Not on file  . Years of education: Not on file  . Highest education level: Not on file  Occupational History  . Not on file  Tobacco Use  . Smoking status: Former Smoker    Packs/day: 0.50    Years: 4.00    Pack years: 2.00    Types: Cigarettes    Quit date: 06/11/1958    Years since quitting: 61.1  . Smokeless tobacco: Former Systems developer    Types: [place] date: 12/01/1958  Substance and Sexual Activity  .  Alcohol use: No    Alcohol/week: 0.0 standard drinks  . Drug use: No  . Sexual activity: Not on file  Other Topics Concern  . Not on file  Social History Narrative  . Not on file   Social Determinants of Health   Financial Resource Strain:   . Difficulty of Paying Living Expenses: Not on file  Food Insecurity:   . Worried About Charity fundraiser in the Last Year: Not on file  . Ran Out of Food in the Last Year: Not on file  Transportation Needs:   . Lack of Transportation (Medical): Not on file  . Lack of Transportation (Non-Medical): Not on file  Physical Activity:   . Days of Exercise per Week: Not on file  . Minutes of Exercise per Session: Not on file  Stress:   . Feeling of Stress : Not on file  Social Connections:   .  Frequency of Communication with Friends and Family: Not on file  . Frequency of Social Gatherings with Friends and Family: Not on file  . Attends Religious Services: Not on file  . Active Member of Clubs or Organizations: Not on file  . Attends Archivist Meetings: Not on file  . Marital Status: Not on file     Family History: The patient's family history includes Asthma in his brother and maternal grandmother; Heart attack in his brother; Heart disease in his brother; Stroke in his father. ROS:   Please see the history of present illness.    All 14 point review of systems negative except as described per history of present illness  EKGs/Labs/Other Studies Reviewed:      Recent Labs: 12/10/2018: ALT 16; BUN 16; Creatinine, Ser 0.84; Potassium 4.2; Sodium 140; TSH 2.020  Recent Lipid Panel No results found for: CHOL, TRIG, HDL, CHOLHDL, VLDL, LDLCALC, LDLDIRECT  Physical Exam:    VS:  BP (!) 116/58   Pulse (!) 59   Temp 98 F (36.7 C)   Ht 5\' 10"  (1.778 m)   Wt 188 lb 9.6 oz (85.5 kg)   SpO2 98%   BMI 27.06 kg/m     Wt Readings from Last 3 Encounters:  07/31/19 188 lb 9.6 oz (85.5 kg)  03/02/19 181 lb (82.1 kg)  12/10/18 181 lb 6.4 oz (82.3 kg)     GEN:  Well nourished, well developed in no acute distress HEENT: Normal NECK: No JVD; No carotid bruits LYMPHATICS: No lymphadenopathy CARDIAC: RRR, no murmurs, no rubs, no gallops RESPIRATORY:  Clear to auscultation without rales, wheezing or rhonchi  ABDOMEN: Soft, non-tender, non-distended MUSCULOSKELETAL:  No edema; No deformity  SKIN: Warm and dry LOWER EXTREMITIES: no swelling NEUROLOGIC:  Alert and oriented x 3 PSYCHIATRIC:  Normal affect   ASSESSMENT:    1. Coronary artery disease involving native heart without angina pectoris, unspecified vessel or lesion type   2. Ischemic cardiomyopathy   3. Nonsustained ventricular tachycardia ([place])   4. Angina pectoris ([place])   5. Dyspnea on exertion   6.  Dyslipidemia    PLAN:    In order of problems listed above:  1. Coronary disease status post PCI stenting to mid LAD in 2017.  Now with constellation of symptoms that may be very worried.  I will continue present management.  He will be scheduled to have a cardiac catheterization.  Procedure was explained to him including all risk benefits as well as alternatives. 2. Nonsustained V. tach.  Again as a part of stratification  he will have cardiac catheterization done.  He did not want to see EP team. 3. Dyspnea on exertion will do cardiac catheterization. 4. Dyslipidemia he is on Crestor 20 which I will continue.  Will call primary care physician with fasting lipid profile   Medication Adjustments/Labs and Tests Ordered: Current medicines are reviewed at length with the patient today.  Concerns regarding medicines are outlined above.  No orders of the defined types were placed in this encounter.  Medication changes: No orders of the defined types were placed in this encounter.   Signed, Park Liter, MD, [facility] 07/31/2019 2:11 PM    [place]

## 2019-07-31 NOTE — Patient Instructions (Addendum)
Medication Instructions:  Your physician recommends that you continue on your current medications as directed. Please refer to the Current Medication list given to you today.  *If you need a refill on your cardiac medications before your next appointment, please call your pharmacy*  Lab Work: Your physician recommends that you return for lab work today: bmp, cbc   If you have labs (blood work) drawn today and your tests are completely normal, you will receive your results only by: Marland Kitchen MyChart Message (if you have MyChart) OR . A paper copy in the mail If you have any lab test that is abnormal or we need to change your treatment, we will call you to review the results.  Testing/Procedures: A chest x-ray takes a picture of the organs and structures inside the chest, including the heart, lungs, and blood vessels. This test can show several things, including, whether the heart is enlarges; whether fluid is building up in the lungs; and whether pacemaker / defibrillator leads are still in place.     [address] [place] [address] Dept: [phone] Loc: [place]  07/31/2019  You are scheduled for a Cardiac Catheterization on Thursday, February 25 with Dr. Shelva Majestic.  1. Please arrive at the [facility] (Main Entrance A) at [facility]: [address] [place], [place] [address] at 8:30 AM (This time is two hours before your procedure to ensure your preparation). Free valet parking service is available.   Special note: Every effort is made to have your procedure done on time. Please understand that emergencies sometimes delay scheduled procedures.  2. Diet: Do not eat solid foods after midnight.  The patient may have clear liquids until 5am upon the day of the procedure.  3. Labs: You will have labs drawn today.   4. Medication instructions in preparation for your  procedure:   Contrast Allergy: No    On the morning of your procedure, take your Aspirin and any morning medicines NOT listed above.  You may use sips of water.  5. Plan for one night stay--bring personal belongings. 6. Bring a current list of your medications and current insurance cards. 7. You MUST have a responsible person to drive you home. 8. Someone MUST be with you the first 24 hours after you arrive home or your discharge will be delayed. 9. Please wear clothes that are easy to get on and off and wear slip-on shoes.  Thank you for allowing [place] to care for you!   -- [facility]   Follow-Up: At [facility], you and your health needs are our priority.  As part of our continuing mission to provide you with exceptional heart care, we have created designated Provider Care Teams.  These Care Teams include your primary Cardiologist (physician) and Advanced Practice Providers (APPs -  Physician Assistants and Nurse Practitioners) who all work together to provide you with the care you need, when you need it.  Your next appointment:   1 month(s)  The format for your next appointment:   In Person  Provider:   Jenne Campus, MD  Other Instructions   Coronary Angiogram With Stent Coronary angiogram with stent placement is a procedure to widen or open a narrow blood vessel of the heart (coronary artery). Arteries may become blocked by cholesterol buildup (plaques) in the lining of the artery wall. When a coronary artery becomes partially blocked,  blood flow to that area decreases. This may lead to chest pain or a heart attack (myocardial infarction). A stent is a small piece of metal that looks like mesh or spring. Stent placement may be done as treatment after a heart attack, or to prevent a heart attack if a blocked artery is found by a coronary angiogram. Let your health care provider know about:  Any allergies you have, including allergies to  medicines or contrast dye.  All medicines you are taking, including vitamins, herbs, eye drops, creams, and over-the-counter medicines.  Any problems you or family members have had with anesthetic medicines.  Any blood disorders you have.  Any surgeries you have had.  Any medical conditions you have, including kidney problems or kidney failure.  Whether you are pregnant or may be pregnant.  Whether you are breastfeeding. What are the risks? Generally, this is a safe procedure. However, serious problems may occur, including:  Damage to nearby structures or organs, such as the heart, blood vessels, or kidneys.  A return of blockage.  Bleeding, infection, or bruising at the insertion site.  A collection of blood under the skin (hematoma) at the insertion site.  A blood clot in another part of the body.  Allergic reaction to medicines or dyes.  Bleeding into the abdomen (retroperitoneal bleeding).  Stroke (rare).  Heart attack (rare). What happens before the procedure? Staying hydrated Follow instructions from your health care provider about hydration, which may include:  Up to 2 hours before the procedure - you may continue to drink clear liquids, such as water, clear fruit juice, black coffee, and plain tea.  Eating and drinking restrictions Follow instructions from your health care provider about eating and drinking, which may include:  8 hours before the procedure - stop eating heavy meals or foods, such as meat, fried foods, or fatty foods.  6 hours before the procedure - stop eating light meals or foods, such as toast or cereal.  2 hours before the procedure - stop drinking clear liquids. Medicines Ask your health care provider about:  Changing or stopping your regular medicines. This is especially important if you are taking diabetes medicines or blood thinners.  Taking medicines such as aspirin and ibuprofen. These medicines can thin your blood. Do not take  these medicines unless your health care provider tells you to take them. ? Generally, aspirin is recommended before a thin tube, called a catheter, is passed through a blood vessel and inserted into the heart (cardiac catheterization).  Taking over-the-counter medicines, vitamins, herbs, and supplements. General instructions  Do not use any products that contain nicotine or tobacco for at least 4 weeks before the procedure. These products include cigarettes, e-cigarettes, and chewing tobacco. If you need help quitting, ask your health care provider.  Plan to have someone take you home from the hospital or clinic.  If you will be going home right after the procedure, plan to have someone with you for 24 hours.  You may have tests and imaging procedures.  Ask your health care provider: ? How your insertion site will be marked. Ask which artery will be used for the procedure. ? What steps will be taken to help prevent infection. These may include:  Removing hair at the insertion site.  Washing skin with a germ-killing soap.  Taking antibiotic medicine. What happens during the procedure?   An IV will be inserted into one of your veins.  Electrodes may be placed on your chest to monitor your  heart rate during the procedure.  You will be given one or more of the following: ? A medicine to help you relax (sedative). ? A medicine to numb the area (local anesthetic) for catheter insertion.  A small incision will be made for catheter insertion.  The catheter will be inserted into an artery using a guide wire. The location may be in your groin, your wrist, or the fold of your arm (near your elbow).  An X-ray procedure (fluoroscopy) will be used to help guide the catheter to the opening of the heart arteries.  A dye will be injected into the catheter. X-rays will be taken. The dye helps to show where any narrowing or blockages are located in the arteries.  Tell your health care provider  if you have chest pain or trouble breathing.  A tiny wire will be guided to the blocked spot, and a balloon will be inflated to make the artery wider.  The stent will be expanded to crush the plaques into the wall of the vessel. The stent will hold the area open and improve the blood flow. Most stents have a drug coating to reduce the risk of the stent narrowing over time.  The artery may be made wider using a drill, laser, or other tools that remove plaques.  The catheter will be removed when the blood flow improves. The stent will stay where it was placed, and the lining of the artery will grow over it.  A bandage (dressing) will be placed on the insertion site. Pressure will be applied to stop bleeding.  The IV will be removed. This procedure may vary among health care providers and hospitals. What happens after the procedure?  Your blood pressure, heart rate, breathing rate, and blood oxygen level will be monitored until you leave the hospital or clinic.  If the procedure is done through the leg, you will lie flat in bed for a few hours or for as long as told by your health care provider. You will be instructed not to bend or cross your legs.  The insertion site and the pulse in your foot or wrist will be checked often.  You may have more blood tests, X-rays, and a test that records the electrical activity of your heart (electrocardiogram, or ECG).  Do not drive for 24 hours if you were given a sedative during your procedure. Summary  Coronary angiogram with stent placement is a procedure to widen or open a narrowed coronary artery. This is done to treat heart problems.  Before the procedure, let your health care provider know about all the medical conditions and surgeries you have or have had.  This is a safe procedure. However, some problems may occur, including damage to nearby structures or organs, bleeding, blood clots, or allergies.  Follow your health care provider's  instructions about eating, drinking, medicines, and other lifestyle changes, such as quitting tobacco use before the procedure. This information is not intended to replace advice given to you by your health care provider. Make sure you discuss any questions you have with your health care provider. Document Revised: 12/17/2018 Document Reviewed: 12/17/2018 Elsevier Patient Education  [place].

## 2019-08-01 LAB — CBC
Hematocrit: 39.2 % (ref 37.5–51.0)
Hemoglobin: 13.2 g/dL (ref 13.0–17.7)
MCH: 31.1 pg (ref 26.6–33.0)
MCHC: 33.7 g/dL (ref 31.5–35.7)
MCV: 93 fL (ref 79–97)
Platelets: 230 10*3/uL (ref 150–450)
RBC: 4.24 x10E6/uL (ref 4.14–5.80)
RDW: 12.6 % (ref 11.6–15.4)
WBC: 5.3 10*3/uL (ref 3.4–10.8)

## 2019-08-01 LAB — BASIC METABOLIC PANEL
BUN/Creatinine Ratio: 20 (ref 10–24)
BUN: 15 mg/dL (ref 8–27)
CO2: 23 mmol/L (ref 20–29)
Calcium: 9.5 mg/dL (ref 8.6–10.2)
Chloride: 105 mmol/L (ref 96–106)
Creatinine, Ser: 0.75 mg/dL — ABNORMAL LOW (ref 0.76–1.27)
GFR calc Af Amer: 103 mL/min/{1.73_m2} (ref >59)
GFR calc non Af Amer: 89 mL/min/{1.73_m2} (ref >59)
Glucose: 95 mg/dL (ref 65–99)
Potassium: 5.3 mmol/L — ABNORMAL HIGH (ref 3.5–5.2)
Sodium: 142 mmol/L (ref 134–144)

## 2019-08-03 ENCOUNTER — Other Ambulatory Visit (HOSPITAL_COMMUNITY)
Admission: RE | Admit: 2019-08-03 | Discharge: 2019-08-03 | Disposition: A | Discharge status: Home / Self Care | Payer: PPO | Source: Ambulatory Visit | Attending: Cardiovascular Disease | Admitting: Cardiovascular Disease

## 2019-08-03 DIAGNOSIS — Z20822 Contact with and (suspected) exposure to covid-19: Secondary | ICD-10-CM | POA: Diagnosis not present

## 2019-08-03 DIAGNOSIS — Z01812 Encounter for preprocedural laboratory examination: Secondary | ICD-10-CM | POA: Insufficient documentation

## 2019-08-03 LAB — SARS CORONAVIRUS 2 (TAT 6-24 HRS): SARS Coronavirus 2: NEGATIVE

## 2019-08-04 ENCOUNTER — Telehealth: Payer: Self-pay | Admitting: *Deleted

## 2019-08-04 ENCOUNTER — Telehealth: Payer: Self-pay | Admitting: Cardiology

## 2019-08-04 NOTE — Telephone Encounter (Signed)
Called patient wife per dpr informed her I resent email for patient to sign up for mychart. He should be able to access results on there. She verbally understood.

## 2019-08-04 NOTE — Telephone Encounter (Signed)
New Message   Pt needs a copy of his covid test results for his dental office.

## 2019-08-04 NOTE — Telephone Encounter (Addendum)
Pt contacted pre-catheterization scheduled at [facility] for: Thursday August 06, 2019 10:30 AM Verified arrival time and place: [place] [facility]) at:  8:30 AM   No solid food after midnight prior to cath, clear liquids until 5 AM day of procedure. Contrast allergy: no  AM meds can be  taken pre-cath with sip of water including: ASA 81 mg Plavix 75 mg  Confirmed patient has responsible adult to drive home post procedure and observe 24 hours after arriving home: yes  Currently, due to Covid-19 pandemic, only one person will be allowed with patient. Must be the same person for patient's entire stay and will be required to wear a mask. They will be asked to wait in the waiting room for the duration of the patient's stay.  Patients are required to wear a mask when they enter the hospital.      COVID-19 Pre-Screening Questions:  . In the past 7 to 10 days have you had a cough,  shortness of breath, headache, congestion, fever (100 or greater) body aches, chills, sore throat, or sudden loss of taste or sense of smell? no . Have you been around anyone with known Covid 19 in the past 7-10 days? no . Have you been around anyone who is awaiting Covid 19 test results in the past 7 to 10 days? no . Have you been around anyone who has been exposed to Covid 19, or has mentioned symptoms of Covid 19 within the past 7 to 10 days? no   I reviewed procedure/mask/visitor instructions, COVID-19 screening questions with patient, he verbalized understanding, thanked me for call.

## 2019-08-06 ENCOUNTER — Encounter (HOSPITAL_COMMUNITY)
Admission: RE | Disposition: A | Discharge status: Home / Self Care | Payer: PPO | Source: Home / Self Care | Attending: Cardiovascular Disease

## 2019-08-06 ENCOUNTER — Other Ambulatory Visit: Payer: Self-pay

## 2019-08-06 ENCOUNTER — Ambulatory Visit (HOSPITAL_COMMUNITY)
Admission: RE | Admit: 2019-08-06 | Discharge: 2019-08-06 | Disposition: A | Discharge status: Home / Self Care | Payer: PPO | Attending: Cardiovascular Disease | Admitting: Cardiovascular Disease

## 2019-08-06 DIAGNOSIS — I25118 Atherosclerotic heart disease of native coronary artery with other forms of angina pectoris: Secondary | ICD-10-CM

## 2019-08-06 DIAGNOSIS — R0609 Other forms of dyspnea: Secondary | ICD-10-CM | POA: Diagnosis not present

## 2019-08-06 DIAGNOSIS — E039 Hypothyroidism, unspecified: Secondary | ICD-10-CM | POA: Diagnosis not present

## 2019-08-06 DIAGNOSIS — Z87891 Personal history of nicotine dependence: Secondary | ICD-10-CM | POA: Diagnosis not present

## 2019-08-06 DIAGNOSIS — G4733 Obstructive sleep apnea (adult) (pediatric): Secondary | ICD-10-CM | POA: Diagnosis not present

## 2019-08-06 DIAGNOSIS — I255 Ischemic cardiomyopathy: Secondary | ICD-10-CM | POA: Diagnosis not present

## 2019-08-06 DIAGNOSIS — Z7982 Long term (current) use of aspirin: Secondary | ICD-10-CM | POA: Diagnosis not present

## 2019-08-06 DIAGNOSIS — I25119 Atherosclerotic heart disease of native coronary artery with unspecified angina pectoris: Secondary | ICD-10-CM | POA: Insufficient documentation

## 2019-08-06 DIAGNOSIS — Z8249 Family history of ischemic heart disease and other diseases of the circulatory system: Secondary | ICD-10-CM | POA: Diagnosis not present

## 2019-08-06 DIAGNOSIS — I472 Ventricular tachycardia: Secondary | ICD-10-CM | POA: Insufficient documentation

## 2019-08-06 DIAGNOSIS — K219 Gastro-esophageal reflux disease without esophagitis: Secondary | ICD-10-CM | POA: Insufficient documentation

## 2019-08-06 DIAGNOSIS — Z79890 Hormone replacement therapy (postmenopausal): Secondary | ICD-10-CM | POA: Diagnosis not present

## 2019-08-06 DIAGNOSIS — E785 Hyperlipidemia, unspecified: Secondary | ICD-10-CM | POA: Insufficient documentation

## 2019-08-06 DIAGNOSIS — Z7902 Long term (current) use of antithrombotics/antiplatelets: Secondary | ICD-10-CM | POA: Diagnosis not present

## 2019-08-06 DIAGNOSIS — Z79899 Other long term (current) drug therapy: Secondary | ICD-10-CM | POA: Insufficient documentation

## 2019-08-06 DIAGNOSIS — Z955 Presence of coronary angioplasty implant and graft: Secondary | ICD-10-CM | POA: Diagnosis not present

## 2019-08-06 DIAGNOSIS — R0600 Dyspnea, unspecified: Secondary | ICD-10-CM

## 2019-08-06 HISTORY — PX: LEFT HEART CATH AND CORONARY ANGIOGRAPHY: CATH118249

## 2019-08-06 SURGERY — LEFT HEART CATH AND CORONARY ANGIOGRAPHY
Anesthesia: LOCAL

## 2019-08-06 MED ORDER — SODIUM CHLORIDE 0.9% FLUSH: 3.0000 mL | Freq: Two times a day (BID) | INTRAVENOUS | Status: DC

## 2019-08-06 MED ORDER — CLOPIDOGREL BISULFATE 75 MG PO TABS: 75.0000 mg | ORAL_TABLET | ORAL | Status: DC

## 2019-08-06 MED ORDER — HEPARIN SODIUM (PORCINE) 1000 UNIT/ML IJ SOLN
INTRAMUSCULAR | Status: AC
  Filled 2019-08-06: qty 1

## 2019-08-06 MED ORDER — MIDAZOLAM HCL 2 MG/2ML IJ SOLN
INTRAMUSCULAR | Status: DC | PRN
  Administered 2019-08-06: 2 mg via INTRAVENOUS

## 2019-08-06 MED ORDER — LIDOCAINE HCL (PF) 1 % IJ SOLN
INTRAMUSCULAR | Status: AC
  Filled 2019-08-06: qty 30

## 2019-08-06 MED ORDER — FENTANYL CITRATE (PF) 100 MCG/2ML IJ SOLN
INTRAMUSCULAR | Status: DC | PRN
  Administered 2019-08-06: 25 ug via INTRAVENOUS

## 2019-08-06 MED ORDER — MIDAZOLAM HCL 2 MG/2ML IJ SOLN
INTRAMUSCULAR | Status: AC
  Filled 2019-08-06: qty 2

## 2019-08-06 MED ORDER — SODIUM CHLORIDE 0.9 % IV SOLN: 250.0000 mL | INTRAVENOUS | Status: DC | PRN

## 2019-08-06 MED ORDER — HEPARIN (PORCINE) IN NACL 1000-0.9 UT/500ML-% IV SOLN
INTRAVENOUS | Status: AC
  Filled 2019-08-06: qty 1000

## 2019-08-06 MED ORDER — ASPIRIN 81 MG PO CHEW: 81.0000 mg | CHEWABLE_TABLET | ORAL | Status: DC

## 2019-08-06 MED ORDER — FENTANYL CITRATE (PF) 100 MCG/2ML IJ SOLN
INTRAMUSCULAR | Status: AC
  Filled 2019-08-06: qty 2

## 2019-08-06 MED ORDER — SODIUM CHLORIDE 0.9% FLUSH: 3.0000 mL | INTRAVENOUS | Status: DC | PRN

## 2019-08-06 MED ORDER — SODIUM CHLORIDE 0.9 % IV SOLN: INTRAVENOUS | Status: DC

## 2019-08-06 MED ORDER — ONDANSETRON HCL 4 MG/2ML IJ SOLN: 4.0000 mg | Freq: Four times a day (QID) | INTRAMUSCULAR | Status: DC | PRN

## 2019-08-06 MED ORDER — LIDOCAINE HCL (PF) 1 % IJ SOLN
INTRAMUSCULAR | Status: DC | PRN
  Administered 2019-08-06: 2 mL

## 2019-08-06 MED ORDER — VERAPAMIL HCL 2.5 MG/ML IV SOLN
INTRAVENOUS | Status: AC
  Filled 2019-08-06: qty 2

## 2019-08-06 MED ORDER — VERAPAMIL HCL 2.5 MG/ML IV SOLN
INTRAVENOUS | Status: DC | PRN
  Administered 2019-08-06: 10 mL via INTRA_ARTERIAL

## 2019-08-06 MED ORDER — HEPARIN (PORCINE) IN NACL 1000-0.9 UT/500ML-% IV SOLN
INTRAVENOUS | Status: DC | PRN
  Administered 2019-08-06 (×2): 500 mL

## 2019-08-06 MED ORDER — HEPARIN SODIUM (PORCINE) 1000 UNIT/ML IJ SOLN
INTRAMUSCULAR | Status: DC | PRN
  Administered 2019-08-06: 4500 [IU] via INTRAVENOUS

## 2019-08-06 MED ORDER — IOHEXOL 350 MG/ML SOLN
INTRAVENOUS | Status: DC | PRN
  Administered 2019-08-06: 80 mL

## 2019-08-06 MED ORDER — ACETAMINOPHEN 325 MG PO TABS: 650.0000 mg | ORAL_TABLET | ORAL | Status: DC | PRN

## 2019-08-06 MED ORDER — SODIUM CHLORIDE 0.9 % WEIGHT BASED INFUSION
3.0000 mL/kg/h | INTRAVENOUS | Status: AC
  Administered 2019-08-06: 3 mL/kg/h via INTRAVENOUS

## 2019-08-06 MED ORDER — SODIUM CHLORIDE 0.9 % WEIGHT BASED INFUSION: 1.0000 mL/kg/h | INTRAVENOUS | Status: DC

## 2019-08-06 SURGICAL SUPPLY — 11 items
CATH INFINITI JR4 5F (CATHETERS) ×2 IMPLANT
CATH OPTITORQUE TIG 4.0 5F (CATHETERS) ×1 IMPLANT
DEVICE RAD COMP TR BAND LRG (VASCULAR PRODUCTS) ×1 IMPLANT
ELECT DEFIB PAD ADLT CADENCE (PAD) ×2 IMPLANT
GLIDESHEATH SLEND SS 6F .021 (SHEATH) ×1 IMPLANT
GUIDEWIRE INQWIRE 1.5J.035X260 (WIRE) IMPLANT
INQWIRE 1.5J .035X260CM (WIRE) ×2
KIT HEART LEFT (KITS) ×2 IMPLANT
PACK CARDIAC CATHETERIZATION (CUSTOM PROCEDURE TRAY) ×2 IMPLANT
TRANSDUCER W/STOPCOCK (MISCELLANEOUS) ×2 IMPLANT
TUBING CIL FLEX 10 FLL-RA (TUBING) ×2 IMPLANT

## 2019-08-06 NOTE — Discharge Instructions (Signed)
Radial Site Care  This sheet gives you information about how to care for yourself after your procedure. Your health care provider may also give you more specific instructions. If you have problems or questions, contact your health care provider. What can I expect after the procedure? After the procedure, it is common to have:  Bruising and tenderness at the catheter insertion area. Follow these instructions at home: Medicines  Take over-the-counter and prescription medicines only as told by your health care provider. Insertion site care  Follow instructions from your health care provider about how to take care of your insertion site. Make sure you: ? Wash your hands with soap and water before you change your bandage (dressing). If soap and water are not available, use hand sanitizer. ? Change your dressing as told by your health care provider. ? Leave stitches (sutures), skin glue, or adhesive strips in place. These skin closures may need to stay in place for 2 weeks or longer. If adhesive strip edges start to loosen and curl up, you may trim the loose edges. Do not remove adhesive strips completely unless your health care provider tells you to do that.  Check your insertion site every day for signs of infection. Check for: ? Redness, swelling, or pain. ? Fluid or blood. ? Pus or a bad smell. ? Warmth.  Do not take baths, swim, or use a hot tub until your health care provider approves.  You may shower 24-48 hours after the procedure, or as directed by your health care provider. ? Remove the dressing and gently wash the site with plain soap and water. ? Pat the area dry with a clean towel. ? Do not rub the site. That could cause bleeding.  Do not apply powder or lotion to the site. Activity   For 24 hours after the procedure, or as directed by your health care provider: ? Do not flex or bend the affected arm. ? Do not push or pull heavy objects with the affected arm. ? Do not  drive yourself home from the hospital or clinic. You may drive 24 hours after the procedure unless your health care provider tells you not to. ? Do not operate machinery or power tools.  Do not lift anything that is heavier than 10 lb (4.5 kg), or the limit that you are told, until your health care provider says that it is safe.  Ask your health care provider when it is okay to: ? Return to work or school. ? Resume usual physical activities or sports. ? Resume sexual activity. General instructions  If the catheter site starts to bleed, raise your arm and put firm pressure on the site. If the bleeding does not stop, get help right away. This is a medical emergency.  If you went home on the same day as your procedure, a responsible adult should be with you for the first 24 hours after you arrive home.  Keep all follow-up visits as told by your health care provider. This is important. Contact a health care provider if:  You have a fever.  You have redness, swelling, or yellow drainage around your insertion site. Get help right away if:  You have unusual pain at the radial site.  The catheter insertion area swells very fast.  The insertion area is bleeding, and the bleeding does not stop when you hold steady pressure on the area.  Your arm or hand becomes pale, cool, tingly, or numb. These symptoms may represent a serious problem   that is an emergency. Do not wait to see if the symptoms will go away. Get medical help right away. Call your local emergency services (911 in the [place] Do not drive yourself to the hospital. Summary  After the procedure, it is common to have bruising and tenderness at the site.  Follow instructions from your health care provider about how to take care of your radial site wound. Check the wound every day for signs of infection.  Do not lift anything that is heavier than 10 lb (4.5 kg), or the limit that you are told, until your health care provider says  that it is safe. This information is not intended to replace advice given to you by your health care provider. Make sure you discuss any questions you have with your health care provider. Document Revised: 07/03/2017 Document Reviewed: 07/03/2017 Elsevier Patient Education  2020 [employer]vier Inc[employer]  

## 2019-08-06 NOTE — Interval H&P Note (Signed)
Cath Lab Visit (complete for each Cath Lab visit)  Clinical Evaluation Leading to the Procedure:   ACS: No.  Non-ACS:    Anginal Classification: CCS III  Anti-ischemic medical therapy: Maximal Therapy (2 or more classes of medications)  Non-Invasive Test Results: No non-invasive testing performed  Prior CABG: No previous CABG      History and Physical Interval Note:  08/06/2019 9:37 AM  Shane Massey  has presented today for surgery, with the diagnosis of chest pain.  The various methods of treatment have been discussed with the patient and family. After consideration of risks, benefits and other options for treatment, the patient has consented to  Procedure(s): LEFT HEART CATH AND CORONARY ANGIOGRAPHY (N/A) as a surgical intervention.  The patient's history has been reviewed, patient examined, no change in status, stable for surgery.  I have reviewed the patient's chart and labs.  Questions were answered to the patient's satisfaction.     Shelva Majestic

## 2019-08-06 NOTE — Progress Notes (Signed)
TCTS consulted for CABG evaluation. °

## 2019-08-09 DIAGNOSIS — E039 Hypothyroidism, unspecified: Secondary | ICD-10-CM | POA: Diagnosis not present

## 2019-08-09 DIAGNOSIS — K219 Gastro-esophageal reflux disease without esophagitis: Secondary | ICD-10-CM | POA: Diagnosis not present

## 2019-08-09 DIAGNOSIS — R7303 Prediabetes: Secondary | ICD-10-CM | POA: Diagnosis not present

## 2019-08-09 DIAGNOSIS — I10 Essential (primary) hypertension: Secondary | ICD-10-CM | POA: Diagnosis not present

## 2019-08-10 ENCOUNTER — Encounter: Payer: Self-pay | Admitting: Cardiothoracic Surgery

## 2019-08-10 ENCOUNTER — Institutional Professional Consult (permissible substitution): Payer: PPO | Admitting: Cardiothoracic Surgery

## 2019-08-10 ENCOUNTER — Other Ambulatory Visit: Payer: Self-pay | Admitting: *Deleted

## 2019-08-10 ENCOUNTER — Other Ambulatory Visit: Payer: Self-pay

## 2019-08-10 VITALS — BP 127/68 | HR 63 | Temp 97.7°F | Resp 16 | Ht 70.0 in | Wt 184.0 lb

## 2019-08-10 DIAGNOSIS — I2510 Atherosclerotic heart disease of native coronary artery without angina pectoris: Secondary | ICD-10-CM

## 2019-08-11 NOTE — Pre-Procedure Instructions (Signed)
Shane Massey  08/11/2019      [facility] - [place], [place] - [place] [address] Phone: [phone] Fax: [phone]  [facility] [address] [place] [id number] [place] [id number] Phone: [phone] Fax: [phone]    Your procedure is scheduled on Mar. 5  Report to [facility] Entrance A at 5:30 A.M.  Call this number if you have problems the morning of surgery:  [phone]   Remember:  Do not eat or drink after midnight.      Take these medicines the morning of surgery with A SIP OF WATER :             Tylenol if needed             dexilant             Eye drops             Isosorbide (isordil)             Levothyroxine (synthroid)                 7 days prior to surgery STOP taking any  Aleve, Naproxen, Ibuprofen, Motrin, Advil, Goody's, BC's, all herbal medications, fish oil, and all vitamins.                 Do not wear jewelry.  Do not wear lotions, powders, or perfumes, or deodorant.  Do not shave 48 hours prior to surgery.  Men may shave face and neck.  Do not bring valuables to the hospital.  [facility] is not responsible for any belongings or valuables.  Contacts, dentures or bridgework may not be worn into surgery.  Leave your suitcase in the car.  After surgery it may be brought to your room.  For patients admitted to the hospital, discharge time will be determined by your treatment team.  Patients discharged the day of surgery will not be allowed to drive home.    Special instructions:  [facility]- Preparing For Surgery  Before surgery, you can play an important role. Because skin is not sterile, your skin needs to be as free of germs as possible. You can reduce the number of germs on your skin by washing with CHG (chlorahexidine gluconate) Soap before surgery.  CHG is an antiseptic cleaner which kills germs and bonds with the skin to continue killing germs even after  washing.    Oral Hygiene is also important to reduce your risk of infection.  Remember - BRUSH YOUR TEETH THE MORNING OF SURGERY WITH YOUR REGULAR TOOTHPASTE  Please do not use if you have an allergy to CHG or antibacterial soaps. If your skin becomes reddened/irritated stop using the CHG.  Do not shave (including legs and underarms) for at least 48 hours prior to first CHG shower. It is OK to shave your face.  Please follow these instructions carefully.   1. Shower the NIGHT BEFORE SURGERY and the MORNING OF SURGERY with CHG.   2. If you chose to wash your hair, wash your hair first as usual with your normal shampoo.  3. After you shampoo, rinse your hair and body thoroughly to remove the shampoo.  4. Use CHG as you would any other liquid soap. You can apply CHG directly to the skin and wash gently with a scrungie or a clean washcloth.   5. Apply the CHG Soap to your body  ONLY FROM THE NECK DOWN.  Do not use on open wounds or open sores. Avoid contact with your eyes, ears, mouth and genitals (private parts). Wash Face and genitals (private parts)  with your normal soap.  6. Wash thoroughly, paying special attention to the area where your surgery will be performed.  7. Thoroughly rinse your body with warm water from the neck down.  8. DO NOT shower/wash with your normal soap after using and rinsing off the CHG Soap.  9. Pat yourself dry with a CLEAN TOWEL.  10. Wear CLEAN PAJAMAS to bed the night before surgery, wear comfortable clothes the morning of surgery  11. Place CLEAN SHEETS on your bed the night of your first shower and DO NOT SLEEP WITH PETS.    Day of Surgery:  Do not apply any deodorants/lotions.  Please wear clean clothes to the hospital/surgery center.   Remember to brush your teeth WITH YOUR REGULAR TOOTHPASTE.    Please read over the following fact sheets that you were given. Coughing and Deep Breathing, MRSA Information and Surgical Site Infection  Prevention

## 2019-08-11 NOTE — Progress Notes (Signed)
[address]       [place] [address]             [place] REPORT  Referring Provider is Troy Sine, MD Primary Cardiologist is Jenne Campus, MD PCP is Maryella Shivers, MD  Chief Complaint  Patient presents with  . Coronary Artery Disease    Surgical eval, Cardiac Cath 08/06/19, ECHO 07/08/18    HPI:  79 year old gentleman with a history of coronary artery disease status post stenting of the LAD artery approximately 4 years ago.  The patient presented electively to Dr. Agustin Cree with symptoms of weakness and easy fatigability.  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  Given his known history of coronary disease and the unusual nature of the symptoms to recommendation was to proceed directly to cardiac catheterization.  This was performed last week and demonstrated 75 to 80% left main coronary artery disease with some distal obstruction as well.  The patient was allowed to return to home for the weekend but presents today for surgical evaluation.  He denies rest pain.  Mostly, his symptoms are shortness of breath and easy fatigue with exertion.  He denies edema or orthopnea.  He has no history of strokes or TIAs.  Importantly, he stopped his Plavix a few days ago in preparation for potential surgery. Past Medical History:  Diagnosis Date  . GERD (gastroesophageal reflux disease)   . Hypothyroidism   . OSA (obstructive sleep apnea)     Past Surgical History:  Procedure Laterality Date  . heart stent Left 09/2015  . LEFT HEART CATH AND CORONARY ANGIOGRAPHY N/A 08/06/2019   Procedure: LEFT HEART CATH AND CORONARY ANGIOGRAPHY;  Surgeon: Troy Sine, MD;  Location: [facility];  Service: Cardiovascular;  Laterality: N/A;    Family History  Problem Relation Age of Onset  . Stroke Father   . Asthma Brother   . Heart disease Brother   . Heart  attack Brother   . Asthma Maternal Grandmother     Social History   Socioeconomic History  . Marital status: Married    Spouse name: Not on file  . Number of children: Not on file  . Years of education: Not on file  . Highest education level: Not on file  Occupational History  . Not on file  Tobacco Use  . Smoking status: Former Smoker    Packs/day: 0.50    Years: 4.00    Pack years: 2.00    Types: Cigarettes    Quit date: 06/11/1958    Years since quitting: 61.2  . Smokeless tobacco: Former Systems developer    Types: [place] date: 12/01/1958  Substance and Sexual Activity  . Alcohol use: No    Alcohol/week: 0.0 standard drinks  . Drug use: No  . Sexual activity: Not on file  Other Topics Concern  . Not on file  Social History Narrative  . Not on file   Social Determinants of Health   Financial Resource Strain:   . Difficulty of Paying Living Expenses: Not on file  Food Insecurity:   . Worried About Charity fundraiser in the Last Year: Not on file  . Ran Out of Food in the Last Year: Not on file  Transportation Needs:   . Lack of Transportation (Medical): Not on file  . Lack of Transportation (  Non-Medical): Not on file  Physical Activity:   . Days of Exercise per Week: Not on file  . Minutes of Exercise per Session: Not on file  Stress:   . Feeling of Stress : Not on file  Social Connections:   . Frequency of Communication with Friends and Family: Not on file  . Frequency of Social Gatherings with Friends and Family: Not on file  . Attends Religious Services: Not on file  . Active Member of Clubs or Organizations: Not on file  . Attends Archivist Meetings: Not on file  . Marital Status: Not on file  Intimate Partner Violence:   . Fear of Current or Ex-Partner: Not on file  . Emotionally Abused: Not on file  . Physically Abused: Not on file  . Sexually Abused: Not on file    Current Outpatient Medications  Medication Sig Dispense Refill  .  acetaminophen (TYLENOL) 650 MG CR tablet Take 650-1,300 mg by mouth every 8 (eight) hours as needed (for arthritis pain.).    Marland Kitchen ascorbic acid (CVS VITAMIN C) 500 MG tablet Take 500 mg by mouth daily at 12 noon.    Marland Kitchen aspirin EC 81 MG tablet Take 81 mg by mouth daily.    . Calcium Carb-Cholecalciferol (CALCIUM 600+D3 PO) Take 1 tablet by mouth daily at 12 noon.    . Coenzyme Q10 (COQ10) 100 MG CAPS Take 100 mg by mouth daily at 12 noon.    Marland Kitchen dexlansoprazole (DEXILANT) 60 MG capsule Take 60 mg by mouth daily before breakfast.    . Glycerin-Hypromellose-PEG 400 (DRY EYE RELIEF DROPS) 0.2-0.2-1 % SOLN Place 1 drop into both eyes 3 (three) times daily as needed (dry/irritated eyes.).    Marland Kitchen isosorbide dinitrate (ISORDIL) 30 MG tablet Take 30 mg by mouth daily.    Marland Kitchen levothyroxine (SYNTHROID, LEVOTHROID) 50 MCG tablet Take 50 mcg by mouth daily before breakfast.    . Multiple Vitamin (MULTIVITAMIN WITH MINERALS) TABS tablet Take 1 tablet by mouth daily at 12 noon.    . pramipexole (MIRAPEX) 1.5 MG tablet Take 1.5 mg by mouth at bedtime.     . rosuvastatin (CRESTOR) 20 MG tablet Take 1 tablet (20 mg total) by mouth daily. (Patient taking differently: Take 20 mg by mouth every evening. ) 90 tablet 1   No current facility-administered medications for this visit.    No Known Allergies    Review of Systems:   General:  Reduced energy, no weight change  Cardiac:  Positive chest pain with exertion, positive SOB with mild exertion, denies palpitations/dizzy spells  Respiratory:  Denies productive cough/ bronchitis,  GI:   Positive frequent heartburn, no hematochezia,  hematemesis, or melena  GU:   No dysuria or kidney disease  Vascular:  Positive leg cramps at night, positive varicose veins  Neuro:   Denies stroke/TIA's  Musculoskeletal: Multifocal arthritis, no reduced mobility   Skin:   Negative  Psych:   Negative  Eyes:    wears glasses   ENT:   last saw dentist last  month  Hematologic:  Negative  Endocrine:  No diabetes, does not check CBG's at home     Physical Exam:   BP 127/68 (BP Location: Right Arm, Patient Position: Sitting, Cuff Size: Normal)   Pulse 63   Temp 97.7 F (36.5 C)   Resp 16   Ht 5\' 10"  (1.778 m)   Wt 83.5 kg   SpO2 96% Comment: RA  BMI 26.40 kg/m   General:  well-appearing  HEENT:  Unremarkable   Neck:   no JVD, no bruits, no adenopathy  Chest:   clear to auscultation, symmetrical breath sounds, no wheezes, no rhonchi   CV:   RRR, no murmur   Abdomen:  soft, non-tender, no masses   Extremities:  warm, well-perfused, pulses intact throughout, no LE edema  Rectal/GU  Deferred  Neuro:   Grossly non-focal and symmetrical throughout  Skin:   Clean and dry, no rashes, no breakdown   Diagnostic Tests:  I have reviewed his available imaging studies including the left heart catheterization performed last week and agree with their interpretation.   Impression:  79 year old gentleman with history of coronary disease status post LAD stenting now found to have severe left main disease as well.  He is otherwise in good physical condition and is a good candidate for definitive surgery.   Plan:  Suggest relatively urgent coronary artery bypass grafting.  This is due to the severity of left main disease.  The patient and his wife are in agreement.  I have explained the risks and benefits of the procedure and their questions have been answered to their satisfaction.  We will pursue additional work-up for risk stratification and tentatively plan surgery for later this week.   I spent in excess of 45  minutes during the conduct of this office consultation and >50% of this time involved direct face-to-face encounter with the patient for counseling and/or coordination of their care.          Level 3 Office Consult = 40 minutes         Level 4 Office Consult = 60 minutes         Level 5 Office Consult = 80 minutes  B. Murvin Natal, MD 08/11/2019 7:19 PM

## 2019-08-12 ENCOUNTER — Other Ambulatory Visit: Payer: Self-pay

## 2019-08-12 ENCOUNTER — Emergency Department (HOSPITAL_COMMUNITY): Payer: PPO

## 2019-08-12 ENCOUNTER — Encounter (HOSPITAL_COMMUNITY): Payer: Self-pay

## 2019-08-12 ENCOUNTER — Ambulatory Visit (HOSPITAL_COMMUNITY)
Admission: RE | Admit: 2019-08-12 | Discharge: 2019-08-12 | Disposition: A | Discharge status: Home / Self Care | Payer: PPO | Source: Ambulatory Visit | Attending: Cardiothoracic Surgery | Admitting: Cardiothoracic Surgery

## 2019-08-12 ENCOUNTER — Encounter (HOSPITAL_COMMUNITY)
Admission: RE | Admit: 2019-08-12 | Discharge: 2019-08-12 | Disposition: A | Discharge status: Home / Self Care | Payer: PPO | Source: Ambulatory Visit | Attending: Cardiothoracic Surgery | Admitting: Cardiothoracic Surgery

## 2019-08-12 ENCOUNTER — Other Ambulatory Visit (HOSPITAL_COMMUNITY): Payer: PPO

## 2019-08-12 ENCOUNTER — Inpatient Hospital Stay (HOSPITAL_COMMUNITY): Payer: PPO

## 2019-08-12 ENCOUNTER — Inpatient Hospital Stay (HOSPITAL_COMMUNITY)
Admission: EM | Admit: 2019-08-12 | Discharge: 2019-08-19 | DRG: 229 | Disposition: A | Discharge status: Home / Self Care | Payer: PPO | Attending: Cardiothoracic Surgery | Admitting: Cardiothoracic Surgery

## 2019-08-12 ENCOUNTER — Ambulatory Visit (HOSPITAL_COMMUNITY): Admission: RE | Admit: 2019-08-12 | Payer: PPO | Source: Ambulatory Visit

## 2019-08-12 DIAGNOSIS — I2510 Atherosclerotic heart disease of native coronary artery without angina pectoris: Principal | ICD-10-CM | POA: Diagnosis present

## 2019-08-12 DIAGNOSIS — Z7982 Long term (current) use of aspirin: Secondary | ICD-10-CM

## 2019-08-12 DIAGNOSIS — Z01810 Encounter for preprocedural cardiovascular examination: Secondary | ICD-10-CM | POA: Diagnosis not present

## 2019-08-12 DIAGNOSIS — R778 Other specified abnormalities of plasma proteins: Secondary | ICD-10-CM

## 2019-08-12 DIAGNOSIS — Z79899 Other long term (current) drug therapy: Secondary | ICD-10-CM | POA: Diagnosis not present

## 2019-08-12 DIAGNOSIS — D62 Acute posthemorrhagic anemia: Secondary | ICD-10-CM | POA: Diagnosis not present

## 2019-08-12 DIAGNOSIS — E785 Hyperlipidemia, unspecified: Secondary | ICD-10-CM | POA: Diagnosis not present

## 2019-08-12 DIAGNOSIS — Z8249 Family history of ischemic heart disease and other diseases of the circulatory system: Secondary | ICD-10-CM | POA: Diagnosis not present

## 2019-08-12 DIAGNOSIS — Q211 Atrial septal defect: Secondary | ICD-10-CM

## 2019-08-12 DIAGNOSIS — Z20822 Contact with and (suspected) exposure to covid-19: Secondary | ICD-10-CM | POA: Diagnosis not present

## 2019-08-12 DIAGNOSIS — I9789 Other postprocedural complications and disorders of the circulatory system, not elsewhere classified: Secondary | ICD-10-CM | POA: Diagnosis not present

## 2019-08-12 DIAGNOSIS — J939 Pneumothorax, unspecified: Secondary | ICD-10-CM | POA: Diagnosis not present

## 2019-08-12 DIAGNOSIS — E039 Hypothyroidism, unspecified: Secondary | ICD-10-CM | POA: Diagnosis present

## 2019-08-12 DIAGNOSIS — G4733 Obstructive sleep apnea (adult) (pediatric): Secondary | ICD-10-CM | POA: Diagnosis not present

## 2019-08-12 DIAGNOSIS — Z955 Presence of coronary angioplasty implant and graft: Secondary | ICD-10-CM | POA: Diagnosis not present

## 2019-08-12 DIAGNOSIS — I5020 Unspecified systolic (congestive) heart failure: Secondary | ICD-10-CM | POA: Diagnosis not present

## 2019-08-12 DIAGNOSIS — Z87891 Personal history of nicotine dependence: Secondary | ICD-10-CM | POA: Diagnosis not present

## 2019-08-12 DIAGNOSIS — G2581 Restless legs syndrome: Secondary | ICD-10-CM | POA: Diagnosis not present

## 2019-08-12 DIAGNOSIS — Z951 Presence of aortocoronary bypass graft: Secondary | ICD-10-CM | POA: Diagnosis not present

## 2019-08-12 DIAGNOSIS — Z79890 Hormone replacement therapy (postmenopausal): Secondary | ICD-10-CM | POA: Diagnosis not present

## 2019-08-12 DIAGNOSIS — R7989 Other specified abnormal findings of blood chemistry: Secondary | ICD-10-CM | POA: Diagnosis not present

## 2019-08-12 DIAGNOSIS — I4891 Unspecified atrial fibrillation: Secondary | ICD-10-CM | POA: Diagnosis not present

## 2019-08-12 DIAGNOSIS — Z7901 Long term (current) use of anticoagulants: Secondary | ICD-10-CM | POA: Diagnosis not present

## 2019-08-12 DIAGNOSIS — I25118 Atherosclerotic heart disease of native coronary artery with other forms of angina pectoris: Secondary | ICD-10-CM | POA: Diagnosis not present

## 2019-08-12 DIAGNOSIS — Z4682 Encounter for fitting and adjustment of non-vascular catheter: Secondary | ICD-10-CM | POA: Diagnosis not present

## 2019-08-12 DIAGNOSIS — Z9889 Other specified postprocedural states: Secondary | ICD-10-CM

## 2019-08-12 DIAGNOSIS — I110 Hypertensive heart disease with heart failure: Secondary | ICD-10-CM | POA: Diagnosis not present

## 2019-08-12 DIAGNOSIS — I082 Rheumatic disorders of both aortic and tricuspid valves: Secondary | ICD-10-CM | POA: Diagnosis not present

## 2019-08-12 DIAGNOSIS — I472 Ventricular tachycardia: Secondary | ICD-10-CM | POA: Diagnosis not present

## 2019-08-12 DIAGNOSIS — J9811 Atelectasis: Secondary | ICD-10-CM | POA: Diagnosis not present

## 2019-08-12 DIAGNOSIS — J90 Pleural effusion, not elsewhere classified: Secondary | ICD-10-CM | POA: Diagnosis not present

## 2019-08-12 DIAGNOSIS — I25110 Atherosclerotic heart disease of native coronary artery with unstable angina pectoris: Secondary | ICD-10-CM | POA: Diagnosis not present

## 2019-08-12 DIAGNOSIS — R55 Syncope and collapse: Secondary | ICD-10-CM | POA: Diagnosis not present

## 2019-08-12 DIAGNOSIS — I255 Ischemic cardiomyopathy: Secondary | ICD-10-CM | POA: Diagnosis not present

## 2019-08-12 DIAGNOSIS — K219 Gastro-esophageal reflux disease without esophagitis: Secondary | ICD-10-CM | POA: Diagnosis not present

## 2019-08-12 DIAGNOSIS — Z01818 Encounter for other preprocedural examination: Secondary | ICD-10-CM | POA: Diagnosis not present

## 2019-08-12 DIAGNOSIS — I252 Old myocardial infarction: Secondary | ICD-10-CM | POA: Diagnosis not present

## 2019-08-12 DIAGNOSIS — I480 Paroxysmal atrial fibrillation: Secondary | ICD-10-CM | POA: Diagnosis not present

## 2019-08-12 DIAGNOSIS — R001 Bradycardia, unspecified: Secondary | ICD-10-CM

## 2019-08-12 HISTORY — DX: Atherosclerotic heart disease of native coronary artery without angina pectoris: I25.10

## 2019-08-12 HISTORY — DX: Acute myocardial infarction, unspecified: I21.9

## 2019-08-12 HISTORY — DX: Essential (primary) hypertension: I10

## 2019-08-12 HISTORY — DX: Cardiac arrhythmia, unspecified: I49.9

## 2019-08-12 HISTORY — DX: Dyspnea, unspecified: R06.00

## 2019-08-12 LAB — BASIC METABOLIC PANEL
Anion gap: 7 (ref 5–15)
BUN: 15 mg/dL (ref 8–23)
CO2: 26 mmol/L (ref 22–32)
Calcium: 9.3 mg/dL (ref 8.9–10.3)
Chloride: 105 mmol/L (ref 98–111)
Creatinine, Ser: 1.17 mg/dL (ref 0.61–1.24)
GFR calc Af Amer: >60 mL/min (ref >60)
GFR calc non Af Amer: >60 mL/min (ref >60)
Glucose, Bld: 145 mg/dL — ABNORMAL HIGH (ref 70–99)
Potassium: 3.9 mmol/L (ref 3.5–5.1)
Sodium: 138 mmol/L (ref 135–145)

## 2019-08-12 LAB — TYPE AND SCREEN
ABO/RH(D): A POS
ABO/RH(D): A POS
Antibody Screen: NEGATIVE
Antibody Screen: NEGATIVE

## 2019-08-12 LAB — HEMOGLOBIN A1C
Hgb A1c MFr Bld: 5.6 % (ref 4.8–5.6)
Mean Plasma Glucose: 114.02 mg/dL

## 2019-08-12 LAB — COMPREHENSIVE METABOLIC PANEL
ALT: 21 U/L (ref 0–44)
AST: 24 U/L (ref 15–41)
Albumin: 3.8 g/dL (ref 3.5–5.0)
Alkaline Phosphatase: 38 U/L (ref 38–126)
Anion gap: 10 (ref 5–15)
BUN: 15 mg/dL (ref 8–23)
CO2: 22 mmol/L (ref 22–32)
Calcium: 9 mg/dL (ref 8.9–10.3)
Chloride: 104 mmol/L (ref 98–111)
Creatinine, Ser: 0.9 mg/dL (ref 0.61–1.24)
GFR calc Af Amer: >60 mL/min (ref >60)
GFR calc non Af Amer: >60 mL/min (ref >60)
Glucose, Bld: 155 mg/dL — ABNORMAL HIGH (ref 70–99)
Potassium: 3.7 mmol/L (ref 3.5–5.1)
Sodium: 136 mmol/L (ref 135–145)
Total Bilirubin: 0.8 mg/dL (ref 0.3–1.2)
Total Protein: 6.6 g/dL (ref 6.5–8.1)

## 2019-08-12 LAB — SURGICAL PCR SCREEN
MRSA, PCR: NEGATIVE
Staphylococcus aureus: NEGATIVE

## 2019-08-12 LAB — CBC
HCT: 39.5 % (ref 39.0–52.0)
HCT: 41.5 % (ref 39.0–52.0)
Hemoglobin: 12.8 g/dL — ABNORMAL LOW (ref 13.0–17.0)
Hemoglobin: 13.6 g/dL (ref 13.0–17.0)
MCH: 30.8 pg (ref 26.0–34.0)
MCH: 31.2 pg (ref 26.0–34.0)
MCHC: 32.4 g/dL (ref 30.0–36.0)
MCHC: 32.8 g/dL (ref 30.0–36.0)
MCV: 95.2 fL (ref 80.0–100.0)
MCV: 95.2 fL (ref 80.0–100.0)
Platelets: 209 10*3/uL (ref 150–400)
Platelets: 222 10*3/uL (ref 150–400)
RBC: 4.15 MIL/uL — ABNORMAL LOW (ref 4.22–5.81)
RBC: 4.36 MIL/uL (ref 4.22–5.81)
RDW: 13.2 % (ref 11.5–15.5)
RDW: 13.2 % (ref 11.5–15.5)
WBC: 5.2 10*3/uL (ref 4.0–10.5)
WBC: 6.3 10*3/uL (ref 4.0–10.5)
nRBC: 0 % (ref 0.0–0.2)
nRBC: 0 % (ref 0.0–0.2)

## 2019-08-12 LAB — APTT: aPTT: 26 seconds (ref 24–36)

## 2019-08-12 LAB — CBG MONITORING, ED: Glucose-Capillary: 137 mg/dL — ABNORMAL HIGH (ref 70–99)

## 2019-08-12 LAB — BLOOD GAS, ARTERIAL
Acid-Base Excess: 0.8 mmol/L (ref 0.0–2.0)
Bicarbonate: 24.9 mmol/L (ref 20.0–28.0)
Drawn by: 421801
FIO2: 21
O2 Saturation: 96.7 %
Patient temperature: 37
pCO2 arterial: 40.3 mmHg (ref 32.0–48.0)
pH, Arterial: 7.408 (ref 7.350–7.450)
pO2, Arterial: 88.5 mmHg (ref 83.0–108.0)

## 2019-08-12 LAB — TROPONIN I (HIGH SENSITIVITY)
Troponin I (High Sensitivity): 19 ng/L — ABNORMAL HIGH (ref <18)
Troponin I (High Sensitivity): 17 ng/L (ref <18)
Troponin I (High Sensitivity): 16 ng/L (ref <18)

## 2019-08-12 LAB — PROTIME-INR
INR: 1 (ref 0.8–1.2)
Prothrombin Time: 12.8 seconds (ref 11.4–15.2)

## 2019-08-12 LAB — PLATELET INHIBITION P2Y12: Platelet Function  P2Y12: 304 [PRU] (ref 182–335)

## 2019-08-12 LAB — ABO/RH: ABO/RH(D): A POS

## 2019-08-12 LAB — SARS CORONAVIRUS 2 (TAT 6-24 HRS): SARS Coronavirus 2: NEGATIVE

## 2019-08-12 LAB — GLUCOSE, CAPILLARY: Glucose-Capillary: 143 mg/dL — ABNORMAL HIGH (ref 70–99)

## 2019-08-12 MED ORDER — PANTOPRAZOLE SODIUM 40 MG PO TBEC
40.0000 mg | DELAYED_RELEASE_TABLET | Freq: Every day | ORAL | Status: DC
  Administered 2019-08-12: 40 mg via ORAL
  Filled 2019-08-12: qty 1

## 2019-08-12 MED ORDER — SODIUM CHLORIDE 0.9 % IV SOLN
1.5000 g | INTRAVENOUS | Status: AC
  Administered 2019-08-13: 1.5 g via INTRAVENOUS
  Filled 2019-08-12: qty 1.5

## 2019-08-12 MED ORDER — DEXMEDETOMIDINE HCL IN NACL 400 MCG/100ML IV SOLN
0.1000 ug/kg/h | INTRAVENOUS | Status: AC
  Administered 2019-08-13: .5 ug/kg/h via INTRAVENOUS
  Filled 2019-08-12: qty 100

## 2019-08-12 MED ORDER — TRANEXAMIC ACID 1000 MG/10ML IV SOLN
1.5000 mg/kg/h | INTRAVENOUS | Status: AC
  Administered 2019-08-13: 1.5 mg/kg/h via INTRAVENOUS
  Filled 2019-08-12: qty 25

## 2019-08-12 MED ORDER — TEMAZEPAM 15 MG PO CAPS: 15.0000 mg | ORAL_CAPSULE | Freq: Once | ORAL | Status: DC | PRN

## 2019-08-12 MED ORDER — METOPROLOL TARTRATE 12.5 MG HALF TABLET: 12.5000 mg | ORAL_TABLET | Freq: Once | ORAL | Status: DC

## 2019-08-12 MED ORDER — ACETAMINOPHEN 325 MG PO TABS: 650.0000 mg | ORAL_TABLET | ORAL | Status: DC | PRN

## 2019-08-12 MED ORDER — SODIUM CHLORIDE 0.9 % IV SOLN
INTRAVENOUS | Status: DC
  Filled 2019-08-12: qty 30

## 2019-08-12 MED ORDER — POTASSIUM CHLORIDE 2 MEQ/ML IV SOLN
80.0000 meq | INTRAVENOUS | Status: DC
  Filled 2019-08-12: qty 40

## 2019-08-12 MED ORDER — TRANEXAMIC ACID (OHS) BOLUS VIA INFUSION
15.0000 mg/kg | INTRAVENOUS | Status: AC
  Administered 2019-08-13: 1249.5 mg via INTRAVENOUS
  Filled 2019-08-12: qty 1250

## 2019-08-12 MED ORDER — NITROGLYCERIN 0.4 MG SL SUBL: 0.4000 mg | SUBLINGUAL_TABLET | SUBLINGUAL | Status: DC | PRN

## 2019-08-12 MED ORDER — COQ10 100 MG PO CAPS: 100.0000 mg | ORAL_CAPSULE | Freq: Every day | ORAL | Status: DC

## 2019-08-12 MED ORDER — VANCOMYCIN HCL 1500 MG/300ML IV SOLN
1500.0000 mg | INTRAVENOUS | Status: AC
  Administered 2019-08-13: 1500 mg via INTRAVENOUS
  Filled 2019-08-12: qty 300

## 2019-08-12 MED ORDER — EPINEPHRINE HCL 5 MG/250ML IV SOLN IN NS
0.0000 ug/min | INTRAVENOUS | Status: DC
  Filled 2019-08-12: qty 250

## 2019-08-12 MED ORDER — HEPARIN SODIUM (PORCINE) 5000 UNIT/ML IJ SOLN
5000.0000 [IU] | Freq: Three times a day (TID) | INTRAMUSCULAR | Status: DC
  Administered 2019-08-12 (×2): 5000 [IU] via SUBCUTANEOUS
  Filled 2019-08-12 (×2): qty 1

## 2019-08-12 MED ORDER — INSULIN REGULAR(HUMAN) IN NACL 100-0.9 UT/100ML-% IV SOLN
INTRAVENOUS | Status: AC
  Administered 2019-08-13: 1 [IU]/h via INTRAVENOUS
  Filled 2019-08-12: qty 100

## 2019-08-12 MED ORDER — CHLORHEXIDINE GLUCONATE 4 % EX LIQD: 30.0000 mL | CUTANEOUS | Status: DC

## 2019-08-12 MED ORDER — MILRINONE LACTATE IN DEXTROSE 20-5 MG/100ML-% IV SOLN
0.3000 ug/kg/min | INTRAVENOUS | Status: DC
  Filled 2019-08-12: qty 100

## 2019-08-12 MED ORDER — MAGNESIUM SULFATE 50 % IJ SOLN
40.0000 meq | INTRAMUSCULAR | Status: DC
  Filled 2019-08-12: qty 9.85

## 2019-08-12 MED ORDER — BISACODYL 5 MG PO TBEC: 5.0000 mg | DELAYED_RELEASE_TABLET | Freq: Once | ORAL | Status: DC

## 2019-08-12 MED ORDER — ONDANSETRON HCL 4 MG/2ML IJ SOLN: 4.0000 mg | Freq: Four times a day (QID) | INTRAMUSCULAR | Status: DC | PRN

## 2019-08-12 MED ORDER — CHLORHEXIDINE GLUCONATE CLOTH 2 % EX PADS
6.0000 | MEDICATED_PAD | Freq: Once | CUTANEOUS | Status: AC
  Administered 2019-08-12: 6 via TOPICAL

## 2019-08-12 MED ORDER — METOPROLOL TARTRATE 25 MG PO TABS: 12.5000 mg | ORAL_TABLET | Freq: Once | ORAL | Status: DC

## 2019-08-12 MED ORDER — ADULT MULTIVITAMIN W/MINERALS CH
1.0000 | ORAL_TABLET | Freq: Every day | ORAL | Status: DC
  Administered 2019-08-12: 1 via ORAL
  Filled 2019-08-12: qty 1

## 2019-08-12 MED ORDER — GLYCERIN-HYPROMELLOSE-PEG 400 0.2-0.2-1 % OP SOLN: 1.0000 [drp] | Freq: Three times a day (TID) | OPHTHALMIC | Status: DC | PRN

## 2019-08-12 MED ORDER — ASCORBIC ACID 500 MG PO TABS
500.0000 mg | ORAL_TABLET | Freq: Every day | ORAL | Status: DC
  Administered 2019-08-12: 500 mg via ORAL
  Filled 2019-08-12: qty 1

## 2019-08-12 MED ORDER — PLASMA-LYTE 148 IV SOLN
INTRAVENOUS | Status: DC
  Filled 2019-08-12: qty 2.5

## 2019-08-12 MED ORDER — NOREPINEPHRINE 4 MG/250ML-% IV SOLN
0.0000 ug/min | INTRAVENOUS | Status: DC
  Filled 2019-08-12: qty 250

## 2019-08-12 MED ORDER — CHLORHEXIDINE GLUCONATE 0.12 % MT SOLN: 15.0000 mL | Freq: Once | OROMUCOSAL | Status: DC

## 2019-08-12 MED ORDER — HYPROMELLOSE (GONIOSCOPIC) 2.5 % OP SOLN
1.0000 [drp] | Freq: Three times a day (TID) | OPHTHALMIC | Status: DC | PRN
  Filled 2019-08-12: qty 15

## 2019-08-12 MED ORDER — ASPIRIN EC 81 MG PO TBEC
81.0000 mg | DELAYED_RELEASE_TABLET | Freq: Every day | ORAL | Status: DC
  Administered 2019-08-12: 81 mg via ORAL
  Filled 2019-08-12: qty 1

## 2019-08-12 MED ORDER — LEVOTHYROXINE SODIUM 50 MCG PO TABS: 50.0000 ug | ORAL_TABLET | Freq: Every day | ORAL | Status: DC

## 2019-08-12 MED ORDER — PRAMIPEXOLE DIHYDROCHLORIDE 1.5 MG PO TABS
1.5000 mg | ORAL_TABLET | Freq: Every day | ORAL | Status: DC
  Administered 2019-08-12: 1.5 mg via ORAL
  Filled 2019-08-12: qty 1

## 2019-08-12 MED ORDER — ROSUVASTATIN CALCIUM 20 MG PO TABS
20.0000 mg | ORAL_TABLET | Freq: Every evening | ORAL | Status: DC
  Administered 2019-08-12 – 2019-08-18 (×6): 20 mg via ORAL
  Filled 2019-08-12 (×7): qty 1

## 2019-08-12 MED ORDER — SODIUM CHLORIDE 0.9 % IV SOLN
750.0000 mg | INTRAVENOUS | Status: AC
  Administered 2019-08-13: 750 mg via INTRAVENOUS
  Filled 2019-08-12: qty 750

## 2019-08-12 MED ORDER — PHENYLEPHRINE HCL-NACL 20-0.9 MG/250ML-% IV SOLN
30.0000 ug/min | INTRAVENOUS | Status: AC
  Administered 2019-08-13: 20 ug/min via INTRAVENOUS
  Filled 2019-08-12: qty 250

## 2019-08-12 MED ORDER — NITROGLYCERIN IN D5W 200-5 MCG/ML-% IV SOLN
2.0000 ug/min | INTRAVENOUS | Status: AC
  Administered 2019-08-13: 10 ug/min via INTRAVENOUS
  Filled 2019-08-12: qty 250

## 2019-08-12 MED ORDER — LEVOTHYROXINE SODIUM 50 MCG PO TABS
50.0000 ug | ORAL_TABLET | Freq: Every day | ORAL | Status: DC
  Administered 2019-08-14 – 2019-08-19 (×6): 50 ug via ORAL
  Filled 2019-08-12 (×6): qty 1

## 2019-08-12 MED ORDER — TRANEXAMIC ACID (OHS) PUMP PRIME SOLUTION
2.0000 mg/kg | INTRAVENOUS | Status: DC
  Filled 2019-08-12: qty 1.67

## 2019-08-12 MED ORDER — CHLORHEXIDINE GLUCONATE 0.12 % MT SOLN
15.0000 mL | Freq: Once | OROMUCOSAL | Status: AC
  Administered 2019-08-13: 15 mL via OROMUCOSAL
  Filled 2019-08-12: qty 15

## 2019-08-12 NOTE — ED Provider Notes (Signed)
[facility] EMERGENCY DEPARTMENT Provider Note   CSN: [id number] Arrival date & time: 08/12/19  [id number]     History No chief complaint on file.   Shane Massey is a 79 y.o. male.  The history is provided by the patient and medical records. No language interpreter was used.  Near Syncope     79 year old male with history of CAD status post stenting of the LAD approximate 4 years ago, hypertension, GERD, obstructive sleep apnea, presenting today for evaluation of weakness and lightheadedness.  Patient is scheduled for CABG on 08/14/2019.  He was coming to preadmission testing today for his scheduled CABG in 2 days however he reportedly having lightheadedness and staff noticed that his blood pressure was soft and direct him to the ER for further evaluation.  Patient report while driving here, he endorsed lightheadedness as if his vision was fading away from him.  He asked his wife to drive instead.  He is currently feeling better.  He mentioned having similar episodes of lightheadedness like this in the past that happened sporadically without any provocation.  He did not report of any vertigo symptoms such as room spinning sensation denies nausea or vomiting denies fever chills denies any active chest pain or shortness of breath or cough denies any focal numbness or focal weakness.  He took his normal medication this morning.  He has discontinue taking Plavix for the past week per recommendation.  Past Medical History:  Diagnosis Date  . Coronary artery disease   . Dyspnea   . Dysrhythmia   . GERD (gastroesophageal reflux disease)   . Hypertension   . Hypothyroidism   . Myocardial infarction ([place])   . OSA (obstructive sleep apnea)     Patient Active Problem List   Diagnosis Date Noted  . 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 Dizziness 06/14/2017  . Restless leg syndrome 11/30/2016  . Ischemic cardiomyopathy 10/24/2016  . OSA on CPAP 11/14/2015  .  Angina pectoris ([place]) 09/06/2015  . Coronary artery disease 09/06/2015  . Dyslipidemia 09/06/2015  . Exertional dyspnea 09/06/2015    Past Surgical History:  Procedure Laterality Date  . heart stent Left 09/2015  . LEFT HEART CATH AND CORONARY ANGIOGRAPHY N/A 08/06/2019   Procedure: LEFT HEART CATH AND CORONARY ANGIOGRAPHY;  Surgeon: Troy Sine, MD;  Location: [facility];  Service: Cardiovascular;  Laterality: N/A;       Family History  Problem Relation Age of Onset  . Stroke Father   . Asthma Brother   . Heart disease Brother   . Heart attack Brother   . Asthma Maternal Grandmother     Social History   Tobacco Use  . Smoking status: Former Smoker    Packs/day: 0.50    Years: 4.00    Pack years: 2.00    Types: Cigarettes    Quit date: 06/11/1958    Years since quitting: 61.2  . Smokeless tobacco: Former Systems developer    Types: Chew    Quit date: 12/01/1958  Substance Use Topics  . Alcohol use: No    Alcohol/week: 0.0 standard drinks  . Drug use: No    Home Medications Prior to Admission medications   Medication Sig Start Date End Date Taking? Authorizing Provider  acetaminophen (TYLENOL) 650 MG CR tablet Take 650-1,300 mg by mouth every 8 (eight) hours as needed (for arthritis pain.).    [provider]  ascorbic acid (CVS VITAMIN C) 500 MG tablet Take 500 mg by  mouth daily at 12 noon.    [provider]  aspirin EC 81 MG tablet Take 81 mg by mouth daily.    [provider]  Calcium Carb-Cholecalciferol (CALCIUM 600+D3 PO) Take 1 tablet by mouth daily at 12 noon.    [provider]  Coenzyme Q10 (COQ10) 100 MG CAPS Take 100 mg by mouth daily at 12 noon.    [provider]  dexlansoprazole (DEXILANT) 60 MG capsule Take 60 mg by mouth daily before breakfast.    [provider]  Glycerin-Hypromellose-PEG 400 (DRY EYE RELIEF DROPS) 0.2-0.2-1 % SOLN Place 1 drop into both eyes 3 (three) times daily as needed  (dry/irritated eyes.).    [provider]  isosorbide dinitrate (ISORDIL) 30 MG tablet Take 30 mg by mouth daily. 07/31/19   [provider]  levothyroxine (SYNTHROID, LEVOTHROID) 50 MCG tablet Take 50 mcg by mouth daily before breakfast.    [provider]  Multiple Vitamin (MULTIVITAMIN WITH MINERALS) TABS tablet Take 1 tablet by mouth daily at 12 noon.    [provider]  pramipexole (MIRAPEX) 1.5 MG tablet Take 1.5 mg by mouth at bedtime.     [provider]  rosuvastatin (CRESTOR) 20 MG tablet Take 1 tablet (20 mg total) by mouth daily. Patient taking differently: Take 20 mg by mouth every evening.  06/15/19 09/13/19  Park Liter, MD    Allergies    Patient has no known allergies.  Review of Systems   Review of Systems  Cardiovascular: Positive for near-syncope.  All other systems reviewed and are negative.   Physical Exam Updated Vital Signs There were no vitals taken for this visit.  Physical Exam Vitals and nursing note reviewed.  Constitutional:      General: He is not in acute distress.    Appearance: He is well-developed.  HENT:     Head: Atraumatic.  Eyes:     Conjunctiva/sclera: Conjunctivae normal.  Cardiovascular:     Rate and Rhythm: Bradycardia present.     Pulses: Normal pulses.     Heart sounds: Normal heart sounds.  Pulmonary:     Effort: Pulmonary effort is normal.     Breath sounds: Normal breath sounds. No wheezing, rhonchi or rales.  Abdominal:     Palpations: Abdomen is soft.     Tenderness: There is no abdominal tenderness.  Musculoskeletal:        General: No swelling.     Cervical back: Neck supple.  Skin:    Capillary Refill: Capillary refill takes less than 2 seconds.     Findings: No rash.  Neurological:     Mental Status: He is alert and oriented to person, place, and time.  Psychiatric:        Mood and Affect: Mood normal.     ED Results / Procedures / Treatments   Labs (all labs  ordered are listed, but only abnormal results are displayed) Labs Reviewed  BASIC METABOLIC PANEL - Abnormal; Notable for the following components:      Result Value   Glucose, Bld 145 (*)    All other components within normal limits  CBG MONITORING, ED - Abnormal; Notable for the following components:   Glucose-Capillary 137 (*)    All other components within normal limits  TROPONIN I (HIGH SENSITIVITY) - Abnormal; Notable for the following components:   Troponin I (High Sensitivity) 19 (*)    All other components within normal limits  SARS CORONAVIRUS 2 (TAT 6-24 HRS)  CBC  ABO/RH  TROPONIN I (HIGH SENSITIVITY)    EKG EKG Interpretation  Date/Time:  Wednesday August 12 2019 09:19:42 EST Ventricular Rate:  50 PR Interval:    QRS Duration: 103 QT Interval:  475 QTC Calculation: 434 R Axis:   60 Text Interpretation: Sinus rhythm Abnormal T, consider ischemia, diffuse leads No significant change since last tracing Confirmed by Dorie Rank [id number]) on 08/12/2019 9:39:20 AM   Radiology No results found.  Procedures .1-3 Lead EKG Interpretation Performed by: Domenic Moras, PA-C Authorized by: Domenic Moras, PA-C     ECG rate assessment: bradycardic     Rhythm: sinus bradycardia   Comments:     The cardiac monitor reviewed sinus bradycardia as interpreted by me. The cardiac monitor was ordered secondary to the patient's history of CAD and to monitor the patient for dysrhythmia   (including critical care time)      Medications Ordered in ED Medications - No data to display  ED Course  I have reviewed the triage vital signs and the nursing notes.  Pertinent labs & imaging results that were available during my care of the patient were reviewed by me and considered in my medical decision making (see chart for details).    MDM Rules/Calculators/A&P                      BP 101/64 (BP Location: Left Arm)   Pulse (!) 50   Temp (!) 97.4 F (36.3 C) (Oral)   Resp 14   SpO2  97%   Final Clinical Impression(s) / ED Diagnoses Final diagnoses:  Near syncope  Bradycardia  Elevated troponin    Rx / DC Orders ED Discharge Orders    None     9:46 AM Patient with history of CAD status post LAD stenting found to have severe left main disease with most recent heart catheterization done several days prior.  He is scheduled to have urgent coronary artery bypass graft in 2 days.  He reportedly experiencing lightheadedness on the way to his preadmitting work-up this a.m.  At this time he is back to his normal baseline.  Patient found to be in sinus bradycardia.  11:12 AM Mildly elevated troponin of 19 however patient denies any active chest pain.  At this time he is back to his baseline.  Appreciate consultation from CT surgery who will see patient in the ED and will determine disposition. Care discussed with DR. Tomi Bamberger  11:44 AM Cardiothoracic surgeon has evaluated patient and request cardiology to admit patient.  Appreciate consultation from cardiology and I spoke with partners to [facility] who will notify cardiologist to see and evaluate patient and will admit patient per recommendation.  COVID-19 screening test ordered.  At this time patient resting comfortably in no acute discomfort.  GARDELL FARINO was evaluated in Emergency Department on 08/12/2019 for the symptoms described in the history of present illness. He was evaluated in the context of the global COVID-19 pandemic, which necessitated consideration that the patient might be at risk for infection with the SARS-CoV-2 virus that causes COVID-19. Institutional protocols and algorithms that pertain to the evaluation of patients at risk for COVID-19 are in a state of rapid change based on information released by regulatory bodies including the CDC and federal and state organizations. These policies and algorithms were followed during the patient's care in the ED.    Domenic Moras, PA-C 08/12/19 1145    Dorie Rank,  MD 08/12/19 [phone]

## 2019-08-12 NOTE — ED Triage Notes (Signed)
Pt coming from pre-admission testing, CABG scheduled on 3/5. Pt came in to appt c.o feeling dizziness, had near syncope and soft BP 104/55 during appt. Pt arrives to ed alert. Labs, EKG and chest xray done this morning during appointment.

## 2019-08-12 NOTE — Progress Notes (Signed)
Provided with incentive spirometry , manage to use it.

## 2019-08-12 NOTE — Progress Notes (Signed)
Pre cabg has been completed.   Preliminary results in CV Proc.   Abram Sander 08/12/2019 3:17 PM

## 2019-08-12 NOTE — H&P (Addendum)
Cardiology Admission History and Physical:   Patient ID: GRASON DRAWHORN MRN: [id number]; DOB: [birth year]   Admission date: 08/12/2019  Primary Care Provider: Maryella Shivers, MD Primary Cardiologist: Jenne Campus, MD  Chief Complaint:  Near syncope  Patient Profile:   Shane Massey is a 79 y.o. male with with history of CAD s/p stenting to mid LAD 08/2015, nonsustained VT, ischemic cardiomyopathy with improved LV function, hypertension, hyperlipidemia hypothyroidism and obstructive sleep apnea presented to [facility] ER with dizziness/near syncope.  Event monitor 05/2018 as showed nonsustained ventricular tachycardia. PVCs and APCs noted some symptomatic. Declined EP evaluation.  Echocardiogram January 2020 showed LV function of 50%.  Most recently seen by Dr. Agustin Cree 07/31/2019.  Noted symptoms worrisome for angina.  Cardiac catheterization by Dr. Claiborne Billings showed patent LAD stent.  70% left main stenosis, 50% first diagonal, 20% mid LAD and 80% ramus intermediate.  LV function 55%.  LVEDP 12 mm.  Seen by Dr. Orvan Seen as outpatient >> scheduled CABG 08/14/19.  History of Present Illness:   Mr. Provencio was on his way to [facility] from [place] for preadmission testing.  While driving, he suddenly felt dizziness and eye rolling.  He pulled over and wife drove him here.  Upon arrival he was better but still having mild dizziness and brought to ER.  Noted hypotensive at 104/55.  Heart rate 48.  Vitals improved spontaneously.  Today patient reports that he has this type of episode for past 4 years.  Previously talked with Dr. Agustin Cree but no intervention or evaluation done per patient.  Never seen ENT or neurologist.  He suddenly felt flushed feeling with lightheadedness and eye rolling.  Symptoms resolved within few minutes.  Usually happens while resting.  He denies associated palpitation, chest pain or shortness of breath.  No syncope.  High-sensitivity troponin 19.  Electrolytes and serum  creatinine are normal.  Glucose 155.  Hemoglobin A1c 5.6.  Pending Covid.  He is at his baseline currently.  Heart Pathway Score:     Past Medical History:  Diagnosis Date  . Coronary artery disease   . Dyspnea   . Dysrhythmia   . GERD (gastroesophageal reflux disease)   . Hypertension   . Hypothyroidism   . Myocardial infarction ([place])   . OSA (obstructive sleep apnea)     Past Surgical History:  Procedure Laterality Date  . heart stent Left 09/2015  . LEFT HEART CATH AND CORONARY ANGIOGRAPHY N/A 08/06/2019   Procedure: LEFT HEART CATH AND CORONARY ANGIOGRAPHY;  Surgeon: Troy Sine, MD;  Location: [facility];  Service: Cardiovascular;  Laterality: N/A;     Medications Prior to Admission: Prior to Admission medications   Medication Sig Start Date End Date Taking? Authorizing Provider  acetaminophen (TYLENOL) 650 MG CR tablet Take 650-1,300 mg by mouth every 8 (eight) hours as needed (for arthritis pain.).    [provider]  ascorbic acid ([employer]S VITAMIN C) 500 MG tablet Take 500 mg by mouth daily at 12 noon.    [provider]  aspirin EC 81 MG tablet Take 81 mg by mouth daily.    [provider]  Calcium Carb-Cholecalciferol (CALCIUM 600+D3 PO) Take 1 tablet by mouth daily at 12 noon.    [provider]  Coenzyme Q10 (COQ10) 100 MG CAPS Take 100 mg by mouth daily at 12 noon.    [provider]  dexlansoprazole (DEXILANT) 60 MG capsule Take 60 mg by mouth daily before breakfast.    [provider]  Glycerin-Hypromellose-PEG 400 (DRY EYE RELIEF DROPS) 0.2-0.2-1 % SOLN Place 1 drop into both eyes 3 (three) times daily as needed (dry/irritated eyes.).    [provider]  isosorbide dinitrate (ISORDIL) 30 MG tablet Take 30 mg by mouth daily. 07/31/19   [provider]  levothyroxine (SYNTHROID, LEVOTHROID) 50 MCG tablet Take 50 mcg by mouth daily before breakfast.    [provider]  Multiple  Vitamin (MULTIVITAMIN WITH MINERALS) TABS tablet Take 1 tablet by mouth daily at 12 noon.    [provider]  pramipexole (MIRAPEX) 1.5 MG tablet Take 1.5 mg by mouth at bedtime.     [provider]  rosuvastatin (CRESTOR) 20 MG tablet Take 1 tablet (20 mg total) by mouth daily. Patient taking differently: Take 20 mg by mouth every evening.  06/15/19 09/13/19  Park Liter, MD     Allergies:   No Known Allergies  Social History:   Social History   Socioeconomic History  . Marital status: Married    Spouse name: Not on file  . Number of children: Not on file  . Years of education: Not on file  . Highest education level: Not on file  Occupational History  . Not on file  Tobacco Use  . Smoking status: Former Smoker    Packs/day: 0.50    Years: 4.00    Pack years: 2.00    Types: Cigarettes    Quit date: 06/11/1958    Years since quitting: 61.2  . Smokeless tobacco: Former Systems developer    Types: [place] date: 12/01/1958  Substance and Sexual Activity  . Alcohol use: No    Alcohol/week: 0.0 standard drinks  . Drug use: No  . Sexual activity: Not on file  Other Topics Concern  . Not on file  Social History Narrative  . Not on file   Social Determinants of Health   Financial Resource Strain:   . Difficulty of Paying Living Expenses: Not on file  Food Insecurity:   . Worried About Charity fundraiser in the Last Year: Not on file  . Ran Out of Food in the Last Year: Not on file  Transportation Needs:   . Lack of Transportation (Medical): Not on file  . Lack of Transportation (Non-Medical): Not on file  Physical Activity:   . Days of Exercise per Week: Not on file  . Minutes of Exercise per Session: Not on file  Stress:   . Feeling of Stress : Not on file  Social Connections:   . Frequency of Communication with Friends and Family: Not on file  . Frequency of Social Gatherings with Friends and Family: Not on file  . Attends Religious Services: Not on file   . Active Member of Clubs or Organizations: Not on file  . Attends Archivist Meetings: Not on file  . Marital Status: Not on file  Intimate Partner Violence:   . Fear of Current or Ex-Partner: Not on file  . Emotionally Abused: Not on file  . Physically Abused: Not on file  . Sexually Abused: Not on file    Family History:  The patient's family history includes Asthma in his brother and maternal grandmother; Heart attack in his brother; Heart disease in his brother; Stroke in his father.    ROS:  Please see the history of present illness.  All other ROS reviewed and negative.     Physical Exam/Data:   Vitals:   08/12/19 1100  08/12/19 1115 08/12/19 1130 08/12/19 1145  BP: (!) 114/56 (!) 117/55 (!) 144/61 123/61  Pulse: (!) 51 (!) 55 (!) 57 (!) 56  Resp: 20 (!) 22 (!) 22 20  Temp:      TempSrc:      SpO2: 97% 97% 98% 97%   No intake or output data in the 24 hours ending 08/12/19 1227 Last 3 Weights 08/12/2019 08/10/2019 08/06/2019  Weight (lbs) 183 lb 9.6 oz 184 lb 184 lb  Weight (kg) 83.28 kg 83.462 kg 83.462 kg     There is no height or weight on file to calculate BMI.  General:  Well nourished, well developed, in no acute distress HEENT: normal Lymph: no adenopathy Neck: no JVD Endocrine:  No thryomegaly Vascular: No carotid bruits; FA pulses 2+ bilaterally without bruits  Cardiac:  normal S1, S2; RRR; no murmur  Lungs:  clear to auscultation bilaterally, no wheezing, rhonchi or rales  Abd: soft, nontender, no hepatomegaly  Ext: no edema Musculoskeletal:  No deformities, BUE and BLE strength normal and equal Skin: warm and dry  Neuro:  CNs 2-12 intact, no focal abnormalities noted Psych:  Normal affect    EKG:  The ECG that was done 08/12/19 was personally reviewed and demonstrates sinus rhythm with T wave inversion laterally Relevant CV Studies:  LEFT HEART CATH AND CORONARY ANGIOGRAPHY 08/06/19  Conclusion    Ost RCA lesion is 20% stenosed.  Prox RCA  lesion is 20% stenosed.  Dist RCA lesion is 30% stenosed.  Mid LM to Dist LM lesion is 75% stenosed.  Ramus lesion is 80% stenosed.  1st Diag lesion is 50% stenosed.  Mid LAD-2 lesion is 5% stenosed.  Mid LAD-1 lesion is 20% stenosed.  Dist LAD lesion is 30% stenosed.  The left ventricular systolic function is normal.  LV end diastolic pressure is normal.   Severe 75% focal distal left main stenosis immediately proximal to its trifurcation into a moderate size LAD with 50% stenosis in the first diagonal vessel, 20% stenosis prior to the mid LAD stent which is patent with 30% stenosis after the stented segment; 80% stenosis in the superior branch of the ramus intermediate vessel and normal-appearing left circumflex coronary artery. RCA is a dominant vessel which has a smooth ostial tapering of 20% followed by 20% proximal and 30% distal stenoses.  LV function segment at 55%.  LVEDP 12 mm.  RECOMMENDATION: Recommend CABG revascularization surgery.  The patient has been on Plavix.  He was instructed to hold his Plavix as of today.  He will be discharged later today on medical therapy and is scheduled to see Dr. Rodena Goldmann on Monday with plans for CABG revascularization next week.   Dominance: Right  Intervention     Laboratory Data:  High Sensitivity Troponin:   Recent Labs  Lab 08/12/19 0920  TROPONINIHS 19*      Chemistry Recent Labs  Lab 08/12/19 0827 08/12/19 0920  NA 136 138  K 3.7 3.9  CL 104 105  CO2 22 26  GLUCOSE 155* 145*  BUN 15 15  CREATININE 0.90 1.17  CALCIUM 9.0 9.3  GFRNONAA >60 >60  GFRAA >60 >60  ANIONGAP 10 7    Recent Labs  Lab 08/12/19 0827  PROT 6.6  ALBUMIN 3.8  AST 24  ALT 21  ALKPHOS 38  BILITOT 0.8   Hematology Recent Labs  Lab 08/12/19 0827 08/12/19 0920  WBC 5.2 6.3  RBC 4.15* 4.36  HGB 12.8* 13.6  HCT 39.5 41.5  MCV 95.2 95.2  MCH 30.8 31.2  MCHC 32.4 32.8  RDW 13.2 13.2  PLT 209 222   Radiology/Studies:    No results found.  Assessment and Plan:   1. Near syncope Longstanding history.  Differential includes CAD, arrhythmia, bradycardia or carotid disease.  No palpitation, chest pain or shortness of breath with lightheadedness.  No seizure-like activity.  His blood pressure was low but his symptoms occurred at rest as well.  Less suspicious for orthostatic.  2.  CAD -Most recent cath showed left main disease with patent LAD stent. - Hs-troponin 19.  More pronounced T wave inversion laterally. -For CABG 08/14/2019 -Check P2Y12 inhibition - Continue ASA and statin - start  Heparin if enzyme trending up  3. HLD - No results found for requested labs within last 8760 hours.  - Check lipid panel - LDL goal less than 70 - Continue Crestor 20mg   4. Hx of NSVT - Monitor on tele  Severity of Illness: The appropriate patient status for this patient is INPATIENT. Inpatient status is judged to be reasonable and necessary in order to provide the required intensity of service to ensure the patient's safety. The patient's presenting symptoms, physical exam findings, and initial radiographic and laboratory data in the context of their chronic comorbidities is felt to place them at high risk for further clinical deterioration. Furthermore, it is not anticipated that the patient will be medically stable for discharge from the hospital within 2 midnights of admission. The following factors support the patient status of inpatient.   " The patient's presenting symptoms include -  near syncope. " The worrisome physical exam findings include - none " The initial radiographic and laboratory data are worrisome because of Elevated trop " The chronic co-morbidities include CAD   * I certify that at the point of admission it is my clinical judgment that the patient will require inpatient hospital care spanning beyond 2 midnights from the point of admission due to high intensity of service, high risk for further  deterioration and high frequency of surveillance required.*    For questions or updates, please contact [place] Please consult [email or web address] for contact info under        Jarrett Soho, [place]  08/12/2019 12:27 PM   Patient seen, examined. Available data reviewed. Agree with findings, assessment, and plan as outlined by Robbie Lis, PA. On my exam: Vitals:   08/12/19 1417 08/12/19 1418  BP: 120/60   Pulse: (!) 54   Resp: 15   Temp: 97.7 F (36.5 C)   SpO2: 96% 96%   Pt is alert and oriented, pleasant elderly male in NAD HEENT: normal Neck: JVP - normal, carotids 2+= without bruits Lungs: CTA bilaterally CV: RRR without murmur or gallop Abd: soft, NT, Positive BS, no hepatomegaly Ext: no C/C/E, distal pulses intact and equal Skin: warm/dry no rash  EKG: sinus brady 50 bpm, ST-T changes consider anterolateral ischemia, no significant change from last tracing. HS trop 19--->17, PRU 304  Unclear etiology of near syncope, chronic intermittent issue. 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 Agree we should admit, finish pre-CABG studies, and prepare patient for cardiac surgery, monitor on telemetry. Last plavix 6 days ago so should be able to proceed with CABG when logistics allow. Updated wife with my assessment. Continue current Rx except isosorbide in case it is exacerbating his dizziness.  Sherren Mocha, M.D. 08/12/2019 2:28 PM

## 2019-08-12 NOTE — Anesthesia Preprocedure Evaluation (Addendum)
Anesthesia Evaluation  Patient identified by MRN, date of birth, ID band Patient awake    Reviewed: Allergy & Precautions, NPO status , Patient's Chart, lab work & pertinent test results  History of Anesthesia Complications Negative for: history of anesthetic complications  Airway Mallampati: II  TM Distance: >3 FB Neck ROM: Full    Dental  (+) Dental Advisory Given, Missing   Pulmonary sleep apnea , former smoker (quit 1960),  08/12/2019 SARS coronavirus NEG   breath sounds clear to auscultation       Cardiovascular hypertension, + angina + CAD, + Past MI and + Cardiac Stents   Rhythm:Regular Rate:Normal  2020 ECHO: EF 50%, mild MR   Neuro/Psych negative neurological ROS     GI/Hepatic Neg liver ROS, GERD  Controlled,  Endo/Other  Hypothyroidism   Renal/GU negative Renal ROS     Musculoskeletal   Abdominal   Peds  Hematology negative hematology ROS (+)   Anesthesia Other Findings   Reproductive/Obstetrics                            Anesthesia Physical Anesthesia Plan  ASA: III  Anesthesia Plan: General   Post-op Pain Management:    Induction: Intravenous  PONV Risk Score and Plan: 2 and Treatment may vary due to age or medical condition  Airway Management Planned: Oral ETT  Additional Equipment: Arterial line, Ultrasound Guidance Line Placement and PA Cath  Intra-op Plan:   Post-operative Plan: Post-operative intubation/ventilation  Informed Consent: I have reviewed the patients History and Physical, chart, labs and discussed the procedure including the risks, benefits and alternatives for the proposed anesthesia with the patient or authorized representative who has indicated his/her understanding and acceptance.     Dental advisory given  Plan Discussed with: CRNA and Surgeon  Anesthesia Plan Comments:        Anesthesia Quick Evaluation

## 2019-08-12 NOTE — ED Provider Notes (Signed)
Medical screening examination/treatment/procedure(s) were conducted as a shared visit with non-physician practitioner(s) and myself.  I personally evaluated the patient during the encounter.  EKG Interpretation  Date/Time:  Wednesday August 12 2019 09:19:42 EST Ventricular Rate:  50 PR Interval:    QRS Duration: 103 QT Interval:  475 QTC Calculation: 434 R Axis:   60 Text Interpretation: Sinus rhythm Abnormal T, consider ischemia, diffuse leads No significant change since last tracing Confirmed by Dorie Rank [phone]) on 08/12/2019 9:39:20 AM  Pt with known CAD.  Was getting pre op labs and felt lightheaded.  No cp.  Feeling much better now.  Will check labs, monitor.   Dorie Rank, MD 08/12/19 1059

## 2019-08-12 NOTE — Progress Notes (Signed)
Patient at Pre- Admission Testing appointment this morning, for CABG surgery 08/14/19 with Dr. Orvan Seen. Upon arrival, patient c/o feeling like he is "about to black out", his vision, "going in  and out". Per patient's wife, [name], patient's symptom onset was en route to this morning's appointment, even asking his wife to switch seats so she could drive. Patient complained of suddenly feeling hot. Vital signs obtained. Patient denies being a diabetic, CBG obtained was 143. Patient stated he did have breakfast. Patient denies CP. Patient is visibly restless. Denies feeling nervous for upcoming surgery.  EKG, CXR and other Pre-op labs obtained. Karoline Caldwell, PA-C notified and has evaluated patient. Patient given water for hydration, responded stating he felt better and was okay with continuing PAT appointment. However, patient's symptoms worsened, with increased dizziness and patient stating he felt like he was going to "black out". Per Jeneen Rinks burns' recommendation, patent taken to Emergency Room for further evaluation.    08/12/19 0758 08/12/19 0842  Vitals  Temp (!) 97.5 F (36.4 C)  --   Pulse Rate (!) 56  --   Pulse Rate Source Monitor  --   Resp 19  --   Respiratory Pattern Regular  --   BP (!) 104/55  --   SpO2 97 %  --   Height and Weight  Height 5\' 10"  (1.778 m)  --   Height Method Stated  --   Weight 83.3 kg  --   Weight Method Actual  --   BMI (Calculated) 26.34  --   BSA (Calculated - sq m) 2.03 sq meters  --   Pain Assessment  Pain Scale  --  0-10  Pain Score  --  0  Patients Stated Pain Goal  --  5  LOC  Level of Consciousness  --  Alert  Orientation Level  --  Oriented X4

## 2019-08-12 NOTE — Progress Notes (Signed)
Admission from the ED by stretcher awake and alert 

## 2019-08-12 NOTE — ED Notes (Signed)
vascular at bedside - Dr. Burt Knack at bedside.

## 2019-08-12 NOTE — ED Notes (Signed)
Pt's wife would like an update whenever possible. Please call her mobile number.

## 2019-08-12 NOTE — ED Notes (Signed)
Pt asked for something to drink. Informed Kim - RN.

## 2019-08-12 NOTE — ED Notes (Signed)
[place] PA at bedside 

## 2019-08-13 ENCOUNTER — Inpatient Hospital Stay (HOSPITAL_COMMUNITY): Payer: PPO | Admitting: Physician Assistant

## 2019-08-13 ENCOUNTER — Inpatient Hospital Stay (HOSPITAL_COMMUNITY): Admission: RE | Admit: 2019-08-13 | Payer: PPO | Source: Home / Self Care | Admitting: Cardiothoracic Surgery

## 2019-08-13 ENCOUNTER — Inpatient Hospital Stay (HOSPITAL_COMMUNITY)
Admission: EM | Disposition: A | Discharge status: Home / Self Care | Payer: Self-pay | Source: Home / Self Care | Attending: Cardiothoracic Surgery

## 2019-08-13 ENCOUNTER — Inpatient Hospital Stay (HOSPITAL_COMMUNITY): Payer: PPO | Admitting: Anesthesiology

## 2019-08-13 ENCOUNTER — Inpatient Hospital Stay (HOSPITAL_COMMUNITY): Payer: PPO

## 2019-08-13 DIAGNOSIS — I2510 Atherosclerotic heart disease of native coronary artery without angina pectoris: Secondary | ICD-10-CM

## 2019-08-13 DIAGNOSIS — Z951 Presence of aortocoronary bypass graft: Secondary | ICD-10-CM

## 2019-08-13 DIAGNOSIS — Q211 Atrial septal defect: Secondary | ICD-10-CM

## 2019-08-13 HISTORY — PX: CORONARY ARTERY BYPASS GRAFT: SHX141

## 2019-08-13 HISTORY — PX: REPAIR OF PATENT FORAMEN OVALE: SHX6064

## 2019-08-13 HISTORY — PX: RADIAL ARTERY HARVEST: SHX5067

## 2019-08-13 HISTORY — PX: TEE WITHOUT CARDIOVERSION: SHX5443

## 2019-08-13 HISTORY — DX: Presence of aortocoronary bypass graft: Z95.1

## 2019-08-13 LAB — POCT I-STAT 7, (LYTES, BLD GAS, ICA,H+H)
Acid-Base Excess: 3 mmol/L — ABNORMAL HIGH (ref 0.0–2.0)
Acid-Base Excess: 2 mmol/L (ref 0.0–2.0)
Acid-base deficit: 1 mmol/L (ref 0.0–2.0)
Acid-base deficit: 4 mmol/L — ABNORMAL HIGH (ref 0.0–2.0)
Acid-base deficit: 3 mmol/L — ABNORMAL HIGH (ref 0.0–2.0)
Bicarbonate: 27.5 mmol/L (ref 20.0–28.0)
Bicarbonate: 26.1 mmol/L (ref 20.0–28.0)
Bicarbonate: 25 mmol/L (ref 20.0–28.0)
Bicarbonate: 22.2 mmol/L (ref 20.0–28.0)
Bicarbonate: 23.5 mmol/L (ref 20.0–28.0)
Calcium, Ion: 1.05 mmol/L — ABNORMAL LOW (ref 1.15–1.40)
Calcium, Ion: 1.11 mmol/L — ABNORMAL LOW (ref 1.15–1.40)
Calcium, Ion: 1.23 mmol/L (ref 1.15–1.40)
Calcium, Ion: 1.2 mmol/L (ref 1.15–1.40)
Calcium, Ion: 1.19 mmol/L (ref 1.15–1.40)
HCT: 27 % — ABNORMAL LOW (ref 39.0–52.0)
HCT: 27 % — ABNORMAL LOW (ref 39.0–52.0)
HCT: 25 % — ABNORMAL LOW (ref 39.0–52.0)
HCT: 28 % — ABNORMAL LOW (ref 39.0–52.0)
HCT: 26 % — ABNORMAL LOW (ref 39.0–52.0)
Hemoglobin: 9.2 g/dL — ABNORMAL LOW (ref 13.0–17.0)
Hemoglobin: 9.2 g/dL — ABNORMAL LOW (ref 13.0–17.0)
Hemoglobin: 8.5 g/dL — ABNORMAL LOW (ref 13.0–17.0)
Hemoglobin: 9.5 g/dL — ABNORMAL LOW (ref 13.0–17.0)
Hemoglobin: 8.8 g/dL — ABNORMAL LOW (ref 13.0–17.0)
O2 Saturation: 100 %
O2 Saturation: 100 %
O2 Saturation: 99 %
O2 Saturation: 97 %
O2 Saturation: 99 %
Patient temperature: 36.2
Patient temperature: 38.5
Patient temperature: 38.1
Potassium: 4.3 mmol/L (ref 3.5–5.1)
Potassium: 4.6 mmol/L (ref 3.5–5.1)
Potassium: 4 mmol/L (ref 3.5–5.1)
Potassium: 4.7 mmol/L (ref 3.5–5.1)
Potassium: 4.6 mmol/L (ref 3.5–5.1)
Sodium: 142 mmol/L (ref 135–145)
Sodium: 141 mmol/L (ref 135–145)
Sodium: 144 mmol/L (ref 135–145)
Sodium: 141 mmol/L (ref 135–145)
Sodium: 142 mmol/L (ref 135–145)
TCO2: 29 mmol/L (ref 22–32)
TCO2: 27 mmol/L (ref 22–32)
TCO2: 26 mmol/L (ref 22–32)
TCO2: 24 mmol/L (ref 22–32)
TCO2: 25 mmol/L (ref 22–32)
pCO2 arterial: 39.5 mmHg (ref 32.0–48.0)
pCO2 arterial: 38.8 mmHg (ref 32.0–48.0)
pCO2 arterial: 44 mmHg (ref 32.0–48.0)
pCO2 arterial: 48.4 mmHg — ABNORMAL HIGH (ref 32.0–48.0)
pCO2 arterial: 47.7 mmHg (ref 32.0–48.0)
pH, Arterial: 7.451 — ABNORMAL HIGH (ref 7.350–7.450)
pH, Arterial: 7.437 (ref 7.350–7.450)
pH, Arterial: 7.359 (ref 7.350–7.450)
pH, Arterial: 7.276 — ABNORMAL LOW (ref 7.350–7.450)
pH, Arterial: 7.306 — ABNORMAL LOW (ref 7.350–7.450)
pO2, Arterial: 366 mmHg — ABNORMAL HIGH (ref 83.0–108.0)
pO2, Arterial: 304 mmHg — ABNORMAL HIGH (ref 83.0–108.0)
pO2, Arterial: 163 mmHg — ABNORMAL HIGH (ref 83.0–108.0)
pO2, Arterial: 115 mmHg — ABNORMAL HIGH (ref 83.0–108.0)
pO2, Arterial: 166 mmHg — ABNORMAL HIGH (ref 83.0–108.0)

## 2019-08-13 LAB — GLUCOSE, CAPILLARY
Glucose-Capillary: 105 mg/dL — ABNORMAL HIGH (ref 70–99)
Glucose-Capillary: 79 mg/dL (ref 70–99)
Glucose-Capillary: 119 mg/dL — ABNORMAL HIGH (ref 70–99)
Glucose-Capillary: 119 mg/dL — ABNORMAL HIGH (ref 70–99)
Glucose-Capillary: 123 mg/dL — ABNORMAL HIGH (ref 70–99)
Glucose-Capillary: 134 mg/dL — ABNORMAL HIGH (ref 70–99)
Glucose-Capillary: 114 mg/dL — ABNORMAL HIGH (ref 70–99)
Glucose-Capillary: 99 mg/dL (ref 70–99)
Glucose-Capillary: 125 mg/dL — ABNORMAL HIGH (ref 70–99)

## 2019-08-13 LAB — CBC
HCT: 39.2 % (ref 39.0–52.0)
HCT: 28.2 % — ABNORMAL LOW (ref 39.0–52.0)
HCT: 30.8 % — ABNORMAL LOW (ref 39.0–52.0)
Hemoglobin: 13.1 g/dL (ref 13.0–17.0)
Hemoglobin: 9.2 g/dL — ABNORMAL LOW (ref 13.0–17.0)
Hemoglobin: 10.1 g/dL — ABNORMAL LOW (ref 13.0–17.0)
MCH: 31.1 pg (ref 26.0–34.0)
MCH: 31.3 pg (ref 26.0–34.0)
MCH: 31.3 pg (ref 26.0–34.0)
MCHC: 33.4 g/dL (ref 30.0–36.0)
MCHC: 32.6 g/dL (ref 30.0–36.0)
MCHC: 32.8 g/dL (ref 30.0–36.0)
MCV: 93.1 fL (ref 80.0–100.0)
MCV: 95.9 fL (ref 80.0–100.0)
MCV: 95.4 fL (ref 80.0–100.0)
Platelets: 199 10*3/uL (ref 150–400)
Platelets: 113 10*3/uL — ABNORMAL LOW (ref 150–400)
Platelets: 134 10*3/uL — ABNORMAL LOW (ref 150–400)
RBC: 4.21 MIL/uL — ABNORMAL LOW (ref 4.22–5.81)
RBC: 2.94 MIL/uL — ABNORMAL LOW (ref 4.22–5.81)
RBC: 3.23 MIL/uL — ABNORMAL LOW (ref 4.22–5.81)
RDW: 13.2 % (ref 11.5–15.5)
RDW: 13.3 % (ref 11.5–15.5)
RDW: 13.3 % (ref 11.5–15.5)
WBC: 6 10*3/uL (ref 4.0–10.5)
WBC: 8.1 10*3/uL (ref 4.0–10.5)
WBC: 8.1 10*3/uL (ref 4.0–10.5)
nRBC: 0 % (ref 0.0–0.2)
nRBC: 0 % (ref 0.0–0.2)
nRBC: 0 % (ref 0.0–0.2)

## 2019-08-13 LAB — BASIC METABOLIC PANEL
Anion gap: 8 (ref 5–15)
Anion gap: 5 (ref 5–15)
BUN: 15 mg/dL (ref 8–23)
BUN: 12 mg/dL (ref 8–23)
CO2: 25 mmol/L (ref 22–32)
CO2: 21 mmol/L — ABNORMAL LOW (ref 22–32)
Calcium: 9.4 mg/dL (ref 8.9–10.3)
Calcium: 7.7 mg/dL — ABNORMAL LOW (ref 8.9–10.3)
Chloride: 107 mmol/L (ref 98–111)
Chloride: 112 mmol/L — ABNORMAL HIGH (ref 98–111)
Creatinine, Ser: 0.96 mg/dL (ref 0.61–1.24)
Creatinine, Ser: 0.68 mg/dL (ref 0.61–1.24)
GFR calc Af Amer: >60 mL/min (ref >60)
GFR calc Af Amer: >60 mL/min (ref >60)
GFR calc non Af Amer: >60 mL/min (ref >60)
GFR calc non Af Amer: >60 mL/min (ref >60)
Glucose, Bld: 113 mg/dL — ABNORMAL HIGH (ref 70–99)
Glucose, Bld: 117 mg/dL — ABNORMAL HIGH (ref 70–99)
Potassium: 4.5 mmol/L (ref 3.5–5.1)
Potassium: 4.7 mmol/L (ref 3.5–5.1)
Sodium: 140 mmol/L (ref 135–145)
Sodium: 138 mmol/L (ref 135–145)

## 2019-08-13 LAB — POCT I-STAT, CHEM 8
BUN: 16 mg/dL (ref 8–23)
BUN: 15 mg/dL (ref 8–23)
BUN: 14 mg/dL (ref 8–23)
BUN: 14 mg/dL (ref 8–23)
BUN: 14 mg/dL (ref 8–23)
Calcium, Ion: 1.3 mmol/L (ref 1.15–1.40)
Calcium, Ion: 1.24 mmol/L (ref 1.15–1.40)
Calcium, Ion: 1.08 mmol/L — ABNORMAL LOW (ref 1.15–1.40)
Calcium, Ion: 1.12 mmol/L — ABNORMAL LOW (ref 1.15–1.40)
Calcium, Ion: 1.19 mmol/L (ref 1.15–1.40)
Chloride: 105 mmol/L (ref 98–111)
Chloride: 106 mmol/L (ref 98–111)
Chloride: 104 mmol/L (ref 98–111)
Chloride: 105 mmol/L (ref 98–111)
Chloride: 105 mmol/L (ref 98–111)
Creatinine, Ser: 0.7 mg/dL (ref 0.61–1.24)
Creatinine, Ser: 0.6 mg/dL — ABNORMAL LOW (ref 0.61–1.24)
Creatinine, Ser: 0.6 mg/dL — ABNORMAL LOW (ref 0.61–1.24)
Creatinine, Ser: 0.6 mg/dL — ABNORMAL LOW (ref 0.61–1.24)
Creatinine, Ser: 0.5 mg/dL — ABNORMAL LOW (ref 0.61–1.24)
Glucose, Bld: 109 mg/dL — ABNORMAL HIGH (ref 70–99)
Glucose, Bld: 104 mg/dL — ABNORMAL HIGH (ref 70–99)
Glucose, Bld: 110 mg/dL — ABNORMAL HIGH (ref 70–99)
Glucose, Bld: 139 mg/dL — ABNORMAL HIGH (ref 70–99)
Glucose, Bld: 126 mg/dL — ABNORMAL HIGH (ref 70–99)
HCT: 37 % — ABNORMAL LOW (ref 39.0–52.0)
HCT: 33 % — ABNORMAL LOW (ref 39.0–52.0)
HCT: 27 % — ABNORMAL LOW (ref 39.0–52.0)
HCT: 27 % — ABNORMAL LOW (ref 39.0–52.0)
HCT: 29 % — ABNORMAL LOW (ref 39.0–52.0)
Hemoglobin: 12.6 g/dL — ABNORMAL LOW (ref 13.0–17.0)
Hemoglobin: 11.2 g/dL — ABNORMAL LOW (ref 13.0–17.0)
Hemoglobin: 9.2 g/dL — ABNORMAL LOW (ref 13.0–17.0)
Hemoglobin: 9.2 g/dL — ABNORMAL LOW (ref 13.0–17.0)
Hemoglobin: 9.9 g/dL — ABNORMAL LOW (ref 13.0–17.0)
Potassium: 4.2 mmol/L (ref 3.5–5.1)
Potassium: 4.4 mmol/L (ref 3.5–5.1)
Potassium: 4.5 mmol/L (ref 3.5–5.1)
Potassium: 4.9 mmol/L (ref 3.5–5.1)
Potassium: 4.1 mmol/L (ref 3.5–5.1)
Sodium: 142 mmol/L (ref 135–145)
Sodium: 140 mmol/L (ref 135–145)
Sodium: 140 mmol/L (ref 135–145)
Sodium: 140 mmol/L (ref 135–145)
Sodium: 141 mmol/L (ref 135–145)
TCO2: 26 mmol/L (ref 22–32)
TCO2: 27 mmol/L (ref 22–32)
TCO2: 26 mmol/L (ref 22–32)
TCO2: 29 mmol/L (ref 22–32)
TCO2: 27 mmol/L (ref 22–32)

## 2019-08-13 LAB — URINALYSIS, ROUTINE W REFLEX MICROSCOPIC
Bilirubin Urine: NEGATIVE
Glucose, UA: NEGATIVE mg/dL
Hgb urine dipstick: NEGATIVE
Ketones, ur: NEGATIVE mg/dL
Leukocytes,Ua: NEGATIVE
Nitrite: NEGATIVE
Protein, ur: NEGATIVE mg/dL
Specific Gravity, Urine: 1.008 (ref 1.005–1.030)
pH: 7 (ref 5.0–8.0)

## 2019-08-13 LAB — LIPID PANEL
Cholesterol: 114 mg/dL (ref 0–200)
HDL: 46 mg/dL (ref >40)
LDL Cholesterol: 43 mg/dL (ref 0–99)
Total CHOL/HDL Ratio: 2.5 RATIO
Triglycerides: 125 mg/dL (ref <150)
VLDL: 25 mg/dL (ref 0–40)

## 2019-08-13 LAB — ECHO INTRAOPERATIVE TEE
Height: 70 in
Weight: 2937.6 oz

## 2019-08-13 LAB — MAGNESIUM: Magnesium: 2.6 mg/dL — ABNORMAL HIGH (ref 1.7–2.4)

## 2019-08-13 LAB — APTT: aPTT: 30 seconds (ref 24–36)

## 2019-08-13 LAB — HEMOGLOBIN A1C
Hgb A1c MFr Bld: 5.7 % — ABNORMAL HIGH (ref 4.8–5.6)
Mean Plasma Glucose: 116.89 mg/dL

## 2019-08-13 LAB — PLATELET COUNT: Platelets: 151 10*3/uL (ref 150–400)

## 2019-08-13 LAB — HEMOGLOBIN AND HEMATOCRIT, BLOOD
HCT: 28.7 % — ABNORMAL LOW (ref 39.0–52.0)
Hemoglobin: 9.5 g/dL — ABNORMAL LOW (ref 13.0–17.0)

## 2019-08-13 LAB — PROTIME-INR
INR: 1.4 — ABNORMAL HIGH (ref 0.8–1.2)
Prothrombin Time: 16.7 seconds — ABNORMAL HIGH (ref 11.4–15.2)

## 2019-08-13 SURGERY — CORONARY ARTERY BYPASS GRAFTING (CABG)
Anesthesia: General | Site: Chest

## 2019-08-13 MED ORDER — LACTATED RINGERS IV SOLN: INTRAVENOUS | Status: DC

## 2019-08-13 MED ORDER — VANCOMYCIN HCL 1000 MG IV SOLR
INTRAVENOUS | Status: DC | PRN
  Administered 2019-08-13: 3000 mg via TOPICAL

## 2019-08-13 MED ORDER — ASPIRIN EC 325 MG PO TBEC: 325.0000 mg | DELAYED_RELEASE_TABLET | Freq: Every day | ORAL | Status: DC

## 2019-08-13 MED ORDER — BUPIVACAINE HCL (PF) 0.5 % IJ SOLN
INTRAMUSCULAR | Status: AC
  Filled 2019-08-13: qty 30

## 2019-08-13 MED ORDER — SODIUM CHLORIDE 0.9 % IV SOLN: INTRAVENOUS | Status: DC

## 2019-08-13 MED ORDER — SODIUM CHLORIDE 0.9 % IV SOLN: 250.0000 mL | INTRAVENOUS | Status: DC

## 2019-08-13 MED ORDER — ALBUMIN HUMAN 5 % IV SOLN
250.0000 mL | INTRAVENOUS | Status: AC | PRN
  Administered 2019-08-13 (×4): 12.5 g via INTRAVENOUS
  Filled 2019-08-13 (×2): qty 250
  Filled 2019-08-13: qty 500

## 2019-08-13 MED ORDER — STERILE WATER FOR INJECTION IJ SOLN
INTRAMUSCULAR | Status: AC
  Filled 2019-08-13: qty 10

## 2019-08-13 MED ORDER — ACETAMINOPHEN 160 MG/5ML PO SOLN: 650.0000 mg | Freq: Once | ORAL | Status: AC

## 2019-08-13 MED ORDER — LACTATED RINGERS IV SOLN: INTRAVENOUS | Status: DC | PRN

## 2019-08-13 MED ORDER — INSULIN REGULAR(HUMAN) IN NACL 100-0.9 UT/100ML-% IV SOLN: INTRAVENOUS | Status: DC

## 2019-08-13 MED ORDER — ACETAMINOPHEN 650 MG RE SUPP
650.0000 mg | Freq: Once | RECTAL | Status: AC
  Administered 2019-08-13: 650 mg via RECTAL

## 2019-08-13 MED ORDER — HEPARIN SODIUM (PORCINE) 1000 UNIT/ML IJ SOLN
INTRAMUSCULAR | Status: DC | PRN
  Administered 2019-08-13: 30000 [IU] via INTRAVENOUS

## 2019-08-13 MED ORDER — DEXMEDETOMIDINE HCL IN NACL 400 MCG/100ML IV SOLN
0.0000 ug/kg/h | INTRAVENOUS | Status: DC
  Administered 2019-08-13: 0.5 ug/kg/h via INTRAVENOUS
  Filled 2019-08-13 (×3): qty 100

## 2019-08-13 MED ORDER — POTASSIUM CHLORIDE 10 MEQ/50ML IV SOLN: 10.0000 meq | INTRAVENOUS | Status: AC

## 2019-08-13 MED ORDER — ROCURONIUM BROMIDE 10 MG/ML (PF) SYRINGE
PREFILLED_SYRINGE | INTRAVENOUS | Status: AC
  Filled 2019-08-13: qty 10

## 2019-08-13 MED ORDER — BUPIVACAINE HCL (PF) 0.5 % IJ SOLN
INTRAMUSCULAR | Status: DC | PRN
  Administered 2019-08-13: 30 mL

## 2019-08-13 MED ORDER — INSULIN ASPART 100 UNIT/ML ~~LOC~~ SOLN
0.0000 [IU] | SUBCUTANEOUS | Status: DC
  Administered 2019-08-14 – 2019-08-15 (×5): 2 [IU] via SUBCUTANEOUS

## 2019-08-13 MED ORDER — ACETAMINOPHEN 500 MG PO TABS
1000.0000 mg | ORAL_TABLET | Freq: Four times a day (QID) | ORAL | Status: AC
  Administered 2019-08-14 – 2019-08-18 (×19): 1000 mg via ORAL
  Filled 2019-08-13 (×18): qty 2

## 2019-08-13 MED ORDER — ASPIRIN 81 MG PO CHEW: 324.0000 mg | CHEWABLE_TABLET | Freq: Every day | ORAL | Status: DC

## 2019-08-13 MED ORDER — BISACODYL 10 MG RE SUPP: 10.0000 mg | Freq: Every day | RECTAL | Status: DC

## 2019-08-13 MED ORDER — KETOROLAC TROMETHAMINE 15 MG/ML IJ SOLN
INTRAMUSCULAR | Status: AC
  Filled 2019-08-13: qty 1

## 2019-08-13 MED ORDER — CHLORHEXIDINE GLUCONATE CLOTH 2 % EX PADS
6.0000 | MEDICATED_PAD | Freq: Every day | CUTANEOUS | Status: DC
  Administered 2019-08-13 – 2019-08-17 (×5): 6 via TOPICAL

## 2019-08-13 MED ORDER — FENTANYL CITRATE (PF) 250 MCG/5ML IJ SOLN
INTRAMUSCULAR | Status: AC
  Filled 2019-08-13: qty 5

## 2019-08-13 MED ORDER — KETOROLAC TROMETHAMINE 15 MG/ML IJ SOLN
7.5000 mg | Freq: Four times a day (QID) | INTRAMUSCULAR | Status: AC
  Administered 2019-08-14 (×5): 7.5 mg via INTRAVENOUS
  Filled 2019-08-13 (×5): qty 1

## 2019-08-13 MED ORDER — MAGNESIUM SULFATE 4 GM/100ML IV SOLN
4.0000 g | Freq: Once | INTRAVENOUS | Status: AC
  Administered 2019-08-13: 4 g via INTRAVENOUS
  Filled 2019-08-13: qty 100

## 2019-08-13 MED ORDER — VANCOMYCIN HCL IN DEXTROSE 1-5 GM/200ML-% IV SOLN
1000.0000 mg | Freq: Once | INTRAVENOUS | Status: AC
  Administered 2019-08-13: 1000 mg via INTRAVENOUS
  Filled 2019-08-13: qty 200

## 2019-08-13 MED ORDER — PHENYLEPHRINE 40 MCG/ML (10ML) SYRINGE FOR IV PUSH (FOR BLOOD PRESSURE SUPPORT)
PREFILLED_SYRINGE | INTRAVENOUS | Status: DC | PRN
  Administered 2019-08-13 (×7): 80 ug via INTRAVENOUS

## 2019-08-13 MED ORDER — LACTATED RINGERS IV SOLN: 500.0000 mL | Freq: Once | INTRAVENOUS | Status: DC | PRN

## 2019-08-13 MED ORDER — ALBUMIN HUMAN 5 % IV SOLN: INTRAVENOUS | Status: DC | PRN

## 2019-08-13 MED ORDER — CALCIUM CHLORIDE 10 % IV SOLN
INTRAVENOUS | Status: DC | PRN
  Administered 2019-08-13 (×2): 100 mg via INTRAVENOUS
  Administered 2019-08-13 (×2): 200 mg via INTRAVENOUS

## 2019-08-13 MED ORDER — PHENYLEPHRINE 40 MCG/ML (10ML) SYRINGE FOR IV PUSH (FOR BLOOD PRESSURE SUPPORT)
PREFILLED_SYRINGE | INTRAVENOUS | Status: AC
  Filled 2019-08-13: qty 10

## 2019-08-13 MED ORDER — PROTAMINE SULFATE 10 MG/ML IV SOLN
INTRAVENOUS | Status: DC | PRN
  Administered 2019-08-13: 300 mg via INTRAVENOUS

## 2019-08-13 MED ORDER — MIDAZOLAM HCL 5 MG/5ML IJ SOLN
INTRAMUSCULAR | Status: DC | PRN
  Administered 2019-08-13: 2 mg via INTRAVENOUS
  Administered 2019-08-13: 3 mg via INTRAVENOUS
  Administered 2019-08-13: 2 mg via INTRAVENOUS
  Administered 2019-08-13: 3 mg via INTRAVENOUS

## 2019-08-13 MED ORDER — ACETAMINOPHEN 160 MG/5ML PO SOLN: 1000.0000 mg | Freq: Four times a day (QID) | ORAL | Status: AC

## 2019-08-13 MED ORDER — CALCIUM CHLORIDE 10 % IV SOLN
INTRAVENOUS | Status: AC
  Filled 2019-08-13: qty 10

## 2019-08-13 MED ORDER — PROTAMINE SULFATE 10 MG/ML IV SOLN
INTRAVENOUS | Status: AC
  Filled 2019-08-13: qty 5

## 2019-08-13 MED ORDER — SODIUM CHLORIDE 0.9% FLUSH
3.0000 mL | Freq: Two times a day (BID) | INTRAVENOUS | Status: DC
  Administered 2019-08-14 – 2019-08-19 (×11): 3 mL via INTRAVENOUS

## 2019-08-13 MED ORDER — HEPARIN SODIUM (PORCINE) 1000 UNIT/ML IJ SOLN
INTRAMUSCULAR | Status: AC
  Filled 2019-08-13: qty 1

## 2019-08-13 MED ORDER — HEMOSTATIC AGENTS (NO CHARGE) OPTIME
TOPICAL | Status: DC | PRN
  Administered 2019-08-13 (×4): 1 via TOPICAL

## 2019-08-13 MED ORDER — BUPIVACAINE LIPOSOME 1.3 % IJ SUSP
20.0000 mL | Freq: Once | INTRAMUSCULAR | Status: DC
  Filled 2019-08-13: qty 20

## 2019-08-13 MED ORDER — SODIUM CHLORIDE 0.45 % IV SOLN: INTRAVENOUS | Status: DC | PRN

## 2019-08-13 MED ORDER — FENTANYL CITRATE (PF) 250 MCG/5ML IJ SOLN
INTRAMUSCULAR | Status: AC
  Filled 2019-08-13: qty 25

## 2019-08-13 MED ORDER — 0.9 % SODIUM CHLORIDE (POUR BTL) OPTIME
TOPICAL | Status: DC | PRN
  Administered 2019-08-13: 5000 mL

## 2019-08-13 MED ORDER — THIAMINE HCL 100 MG/ML IJ SOLN
Freq: Once | INTRAVENOUS | Status: AC
  Filled 2019-08-13: qty 1000

## 2019-08-13 MED ORDER — TRAMADOL HCL 50 MG PO TABS
50.0000 mg | ORAL_TABLET | ORAL | Status: DC | PRN
  Administered 2019-08-16 – 2019-08-17 (×2): 50 mg via ORAL
  Filled 2019-08-13 (×2): qty 1

## 2019-08-13 MED ORDER — BISACODYL 5 MG PO TBEC
10.0000 mg | DELAYED_RELEASE_TABLET | Freq: Every day | ORAL | Status: DC
  Administered 2019-08-14 – 2019-08-16 (×3): 10 mg via ORAL
  Filled 2019-08-13 (×5): qty 2

## 2019-08-13 MED ORDER — PROTAMINE SULFATE 10 MG/ML IV SOLN
INTRAVENOUS | Status: AC
  Filled 2019-08-13: qty 25

## 2019-08-13 MED ORDER — GLYCOPYRROLATE PF 0.2 MG/ML IJ SOSY
PREFILLED_SYRINGE | INTRAMUSCULAR | Status: DC | PRN
  Administered 2019-08-13 (×2): .1 mg via INTRAVENOUS

## 2019-08-13 MED ORDER — MORPHINE SULFATE (PF) 2 MG/ML IV SOLN
1.0000 mg | INTRAVENOUS | Status: DC | PRN
  Administered 2019-08-13 (×3): 2 mg via INTRAVENOUS
  Administered 2019-08-14: 4 mg via INTRAVENOUS
  Filled 2019-08-13 (×3): qty 1
  Filled 2019-08-13: qty 2

## 2019-08-13 MED ORDER — PLASMA-LYTE 148 IV SOLN
INTRAVENOUS | Status: DC | PRN
  Administered 2019-08-13: 500 mL

## 2019-08-13 MED ORDER — DEXTROSE 50 % IV SOLN: 0.0000 mL | INTRAVENOUS | Status: DC | PRN

## 2019-08-13 MED ORDER — METOPROLOL TARTRATE 25 MG/10 ML ORAL SUSPENSION: 12.5000 mg | Freq: Two times a day (BID) | ORAL | Status: DC

## 2019-08-13 MED ORDER — ROCURONIUM BROMIDE 10 MG/ML (PF) SYRINGE
PREFILLED_SYRINGE | INTRAVENOUS | Status: DC | PRN
  Administered 2019-08-13: 100 mg via INTRAVENOUS
  Administered 2019-08-13: 30 mg via INTRAVENOUS
  Administered 2019-08-13: 70 mg via INTRAVENOUS

## 2019-08-13 MED ORDER — FENTANYL CITRATE (PF) 250 MCG/5ML IJ SOLN
INTRAMUSCULAR | Status: DC | PRN
  Administered 2019-08-13: 100 ug via INTRAVENOUS
  Administered 2019-08-13: 50 ug via INTRAVENOUS
  Administered 2019-08-13: 150 ug via INTRAVENOUS
  Administered 2019-08-13: 450 ug via INTRAVENOUS
  Administered 2019-08-13 (×3): 100 ug via INTRAVENOUS
  Administered 2019-08-13 (×2): 150 ug via INTRAVENOUS
  Administered 2019-08-13: 100 ug via INTRAVENOUS
  Administered 2019-08-13: 50 ug via INTRAVENOUS

## 2019-08-13 MED ORDER — METOPROLOL TARTRATE 5 MG/5ML IV SOLN: 2.5000 mg | INTRAVENOUS | Status: DC | PRN

## 2019-08-13 MED ORDER — ISOSORBIDE MONONITRATE ER 30 MG PO TB24: 30.0000 mg | ORAL_TABLET | Freq: Every day | ORAL | Status: DC

## 2019-08-13 MED ORDER — DOCUSATE SODIUM 100 MG PO CAPS
200.0000 mg | ORAL_CAPSULE | Freq: Every day | ORAL | Status: DC
  Administered 2019-08-14 – 2019-08-19 (×4): 200 mg via ORAL
  Filled 2019-08-13 (×6): qty 2

## 2019-08-13 MED ORDER — VANCOMYCIN HCL 1000 MG IV SOLR
INTRAVENOUS | Status: AC
  Filled 2019-08-13: qty 3000

## 2019-08-13 MED ORDER — SODIUM CHLORIDE 0.9% FLUSH: 3.0000 mL | INTRAVENOUS | Status: DC | PRN

## 2019-08-13 MED ORDER — OXYCODONE HCL 5 MG PO TABS
5.0000 mg | ORAL_TABLET | ORAL | Status: DC | PRN
  Administered 2019-08-14 – 2019-08-15 (×3): 5 mg via ORAL
  Administered 2019-08-19: 10 mg via ORAL
  Filled 2019-08-13: qty 2
  Filled 2019-08-13 (×3): qty 1

## 2019-08-13 MED ORDER — MIDAZOLAM HCL 2 MG/2ML IJ SOLN
2.0000 mg | INTRAMUSCULAR | Status: DC | PRN
  Filled 2019-08-13: qty 2

## 2019-08-13 MED ORDER — METOPROLOL TARTRATE 12.5 MG HALF TABLET
12.5000 mg | ORAL_TABLET | Freq: Two times a day (BID) | ORAL | Status: DC
  Administered 2019-08-14: 12.5 mg via ORAL
  Filled 2019-08-13 (×2): qty 1

## 2019-08-13 MED ORDER — NICARDIPINE HCL IN NACL 20-0.86 MG/200ML-% IV SOLN
3.0000 mg/h | INTRAVENOUS | Status: DC
  Administered 2019-08-13: 5 mg/h via INTRAVENOUS
  Filled 2019-08-13 (×2): qty 200

## 2019-08-13 MED ORDER — FAMOTIDINE IN NACL 20-0.9 MG/50ML-% IV SOLN
20.0000 mg | Freq: Two times a day (BID) | INTRAVENOUS | Status: AC
  Administered 2019-08-13: 20 mg via INTRAVENOUS

## 2019-08-13 MED ORDER — SODIUM CHLORIDE 0.9 % IV SOLN
1.5000 g | Freq: Two times a day (BID) | INTRAVENOUS | Status: AC
  Administered 2019-08-13 – 2019-08-15 (×4): 1.5 g via INTRAVENOUS
  Filled 2019-08-13 (×4): qty 1.5

## 2019-08-13 MED ORDER — KETOROLAC TROMETHAMINE 15 MG/ML IJ SOLN
15.0000 mg | Freq: Once | INTRAMUSCULAR | Status: AC
  Administered 2019-08-13: 15 mg via INTRAVENOUS

## 2019-08-13 MED ORDER — PROPOFOL 10 MG/ML IV BOLUS
INTRAVENOUS | Status: DC | PRN
  Administered 2019-08-13: 20 mg via INTRAVENOUS
  Administered 2019-08-13 (×2): 50 mg via INTRAVENOUS

## 2019-08-13 MED ORDER — ONDANSETRON HCL 4 MG/2ML IJ SOLN
4.0000 mg | Freq: Four times a day (QID) | INTRAMUSCULAR | Status: DC | PRN
  Administered 2019-08-13 – 2019-08-15 (×2): 4 mg via INTRAVENOUS
  Filled 2019-08-13 (×2): qty 2

## 2019-08-13 MED ORDER — CHLORHEXIDINE GLUCONATE 0.12 % MT SOLN
15.0000 mL | OROMUCOSAL | Status: AC
  Administered 2019-08-13: 15 mL via OROMUCOSAL

## 2019-08-13 MED ORDER — BUPIVACAINE LIPOSOME 1.3 % IJ SUSP
INTRAMUSCULAR | Status: DC | PRN
  Administered 2019-08-13: 20 mL

## 2019-08-13 MED ORDER — PHENYLEPHRINE HCL-NACL 20-0.9 MG/250ML-% IV SOLN: 0.0000 ug/min | INTRAVENOUS | Status: DC

## 2019-08-13 MED ORDER — STERILE WATER FOR INJECTION IJ SOLN
INTRAMUSCULAR | Status: DC | PRN
  Administered 2019-08-13: 10 mL via INTRA_ARTERIAL

## 2019-08-13 MED ORDER — NITROGLYCERIN IN D5W 200-5 MCG/ML-% IV SOLN: 5.0000 ug/min | INTRAVENOUS | Status: DC

## 2019-08-13 MED ORDER — PROPOFOL 10 MG/ML IV BOLUS
INTRAVENOUS | Status: AC
  Filled 2019-08-13: qty 20

## 2019-08-13 MED ORDER — PANTOPRAZOLE SODIUM 40 MG PO TBEC
40.0000 mg | DELAYED_RELEASE_TABLET | Freq: Every day | ORAL | Status: DC
  Administered 2019-08-15 – 2019-08-19 (×5): 40 mg via ORAL
  Filled 2019-08-13 (×5): qty 1

## 2019-08-13 MED ORDER — MIDAZOLAM HCL (PF) 10 MG/2ML IJ SOLN
INTRAMUSCULAR | Status: AC
  Filled 2019-08-13: qty 2

## 2019-08-13 MED ORDER — STERILE WATER FOR INJECTION IJ SOLN
INTRAMUSCULAR | Status: DC | PRN
  Administered 2019-08-13: 1000 mL via INTRAVASCULAR

## 2019-08-13 SURGICAL SUPPLY — 134 items
ADAPTER CARDIO PERF ANTE/RETRO (ADAPTER) ×4 IMPLANT
ADH SKN CLS APL DERMABOND .7 (GAUZE/BANDAGES/DRESSINGS) ×18
ADPR PRFSN 84XANTGRD RTRGD (ADAPTER) ×3
APPLIER CLIP 9.375 SM OPEN (CLIP)
APR CLP SM 9.3 20 MLT OPN (CLIP)
BAG DECANTER FOR FLEXI CONT (MISCELLANEOUS) ×4 IMPLANT
BASKET HEART (ORDER IN 25'S) (MISCELLANEOUS) ×4
BASKET HEART (ORDER IN 25S) (MISCELLANEOUS) ×3 IMPLANT
BLADE CLIPPER SURG (BLADE) ×4 IMPLANT
BLADE STERNUM SYSTEM 6 (BLADE) ×4 IMPLANT
BLADE SURG 15 STRL LF DISP TIS (BLADE) ×3 IMPLANT
BLADE SURG 15 STRL SS (BLADE)
BNDG ELASTIC 4X5.8 VLCR STR LF (GAUZE/BANDAGES/DRESSINGS) ×4 IMPLANT
BNDG ELASTIC 6X5.8 VLCR STR LF (GAUZE/BANDAGES/DRESSINGS) ×3 IMPLANT
BNDG GAUZE ELAST 4 BULKY (GAUZE/BANDAGES/DRESSINGS) ×4 IMPLANT
CANISTER SUCT 3000ML PPV (MISCELLANEOUS) ×4 IMPLANT
CANN PRFSN 3/8XCNCT ST RT ANG (MISCELLANEOUS) ×3
CANN PRFSN 3/8XRT ANG TPR 14 (MISCELLANEOUS) ×3
CANNULA NON VENT 18FR 12 (CANNULA) ×1 IMPLANT
CANNULA PRFSN 3/8XCNCT RT ANG (MISCELLANEOUS) IMPLANT
CANNULA PRFSN 3/8XRT ANG TPR14 (MISCELLANEOUS) IMPLANT
CANNULA VEN MTL TIP RT (MISCELLANEOUS) ×8
CATH CPB KIT HENDRICKSON (MISCELLANEOUS) ×4 IMPLANT
CATH ROBINSON RED A/P 18FR (CATHETERS) ×10 IMPLANT
CLIP APPLIE 9.375 SM OPEN (CLIP) ×3 IMPLANT
CLIP RETRACTION 3.0MM CORONARY (MISCELLANEOUS) ×4 IMPLANT
CLIP VESOCCLUDE MED 24/CT (CLIP) IMPLANT
CLIP VESOCCLUDE SM WIDE 24/CT (CLIP) ×3 IMPLANT
CONN 1/2X1/2X1/2  BEN (MISCELLANEOUS) ×4
CONN 1/2X1/2X1/2 BEN (MISCELLANEOUS) IMPLANT
CONN 3/8X1/2 ST GISH (MISCELLANEOUS) ×2 IMPLANT
CONN ST 1/4X3/8  BEN (MISCELLANEOUS) ×8
CONN ST 1/4X3/8 BEN (MISCELLANEOUS) IMPLANT
COVER MAYO STAND STRL (DRAPES) ×4 IMPLANT
COVER PROBE W GEL 5X96 (DRAPES) ×1 IMPLANT
CUFF TOURN SGL QUICK 18X4 (TOURNIQUET CUFF) IMPLANT
CUFF TOURN SGL QUICK 24 (TOURNIQUET CUFF)
CUFF TRNQT CYL 24X4X16.5-23 (TOURNIQUET CUFF) IMPLANT
DERMABOND ADVANCED (GAUZE/BANDAGES/DRESSINGS) ×6
DERMABOND ADVANCED .7 DNX12 (GAUZE/BANDAGES/DRESSINGS) ×3 IMPLANT
DRAIN CHANNEL 28F RND 3/8 FF (WOUND CARE) ×12 IMPLANT
DRAPE CARDIOVASCULAR INCISE (DRAPES) ×4
DRAPE EXTREMITY T 121X128X90 (DISPOSABLE) ×1 IMPLANT
DRAPE HALF SHEET 40X57 (DRAPES) ×4 IMPLANT
DRAPE SLUSH/WARMER DISC (DRAPES) ×4 IMPLANT
DRAPE SRG 135X102X78XABS (DRAPES) ×3 IMPLANT
DRSG AQUACEL AG ADV 3.5X14 (GAUZE/BANDAGES/DRESSINGS) ×4 IMPLANT
ELECT BLADE 4.0 EZ CLEAN MEGAD (MISCELLANEOUS) ×4
ELECT CAUTERY BLADE 6.4 (BLADE) ×4 IMPLANT
ELECT REM PT RETURN 9FT ADLT (ELECTROSURGICAL) ×8
ELECTRODE BLDE 4.0 EZ CLN MEGD (MISCELLANEOUS) IMPLANT
ELECTRODE REM PT RTRN 9FT ADLT (ELECTROSURGICAL) ×6 IMPLANT
FELT TEFLON 1X6 (MISCELLANEOUS) ×7 IMPLANT
GAUZE SPONGE 4X4 12PLY STRL (GAUZE/BANDAGES/DRESSINGS) ×8 IMPLANT
GAUZE SPONGE 4X4 12PLY STRL LF (GAUZE/BANDAGES/DRESSINGS) ×2 IMPLANT
GEL ULTRASOUND 20GR AQUASONIC (MISCELLANEOUS) ×3 IMPLANT
GLOVE BIO SURGEON STRL SZ 6.5 (GLOVE) ×6 IMPLANT
GLOVE BIOGEL PI IND STRL 6.5 (GLOVE) IMPLANT
GLOVE BIOGEL PI IND STRL 7.0 (GLOVE) IMPLANT
GLOVE BIOGEL PI IND STRL 7.5 (GLOVE) IMPLANT
GLOVE BIOGEL PI IND STRL 8.5 (GLOVE) IMPLANT
GLOVE BIOGEL PI INDICATOR 6.5 (GLOVE) ×1
GLOVE BIOGEL PI INDICATOR 7.0 (GLOVE) ×1
GLOVE BIOGEL PI INDICATOR 7.5 (GLOVE) ×4
GLOVE BIOGEL PI INDICATOR 8.5 (GLOVE) ×1
GLOVE NEODERM STRL 7.5  LF PF (GLOVE) ×9
GLOVE NEODERM STRL 7.5 LF PF (GLOVE) ×9 IMPLANT
GLOVE SURG NEODERM 7.5  LF PF (GLOVE) ×3
GLOVE SURG SS PI 7.0 STRL IVOR (GLOVE) ×1 IMPLANT
GLOVE SURG SS PI 7.5 STRL IVOR (GLOVE) ×2 IMPLANT
GOWN STRL REUS W/ TWL LRG LVL3 (GOWN DISPOSABLE) ×12 IMPLANT
GOWN STRL REUS W/ TWL XL LVL3 (GOWN DISPOSABLE) IMPLANT
GOWN STRL REUS W/TWL LRG LVL3 (GOWN DISPOSABLE) ×40
GOWN STRL REUS W/TWL XL LVL3 (GOWN DISPOSABLE) ×16
HEMOSTAT POWDER SURGIFOAM 1G (HEMOSTASIS) ×8 IMPLANT
KIT BASIN OR (CUSTOM PROCEDURE TRAY) ×4 IMPLANT
KIT SUCTION CATH 14FR (SUCTIONS) ×4 IMPLANT
KIT TURNOVER KIT B (KITS) ×4 IMPLANT
KIT VASOVIEW HEMOPRO 2 VH 4000 (KITS) ×3 IMPLANT
MARKER GRAFT CORONARY BYPASS (MISCELLANEOUS) ×10 IMPLANT
NDL 18GX1X1/2 (RX/OR ONLY) (NEEDLE) ×3 IMPLANT
NEEDLE 18GX1X1/2 (RX/OR ONLY) (NEEDLE) ×8 IMPLANT
NS IRRIG 1000ML POUR BTL (IV SOLUTION) ×20 IMPLANT
PACK E OPEN HEART (SUTURE) ×4 IMPLANT
PACK OPEN HEART (CUSTOM PROCEDURE TRAY) ×4 IMPLANT
PACK SPY-PHI (KITS) ×1 IMPLANT
PAD ARMBOARD 7.5X6 YLW CONV (MISCELLANEOUS) ×8 IMPLANT
PAD ELECT DEFIB RADIOL ZOLL (MISCELLANEOUS) ×4 IMPLANT
PENCIL BUTTON HOLSTER BLD 10FT (ELECTRODE) ×4 IMPLANT
POSITIONER HEAD DONUT 9IN (MISCELLANEOUS) ×4 IMPLANT
POWDER SURGICEL 3.0 GRAM (HEMOSTASIS) ×1 IMPLANT
PUNCH AORTIC ROTATE 4.0MM (MISCELLANEOUS) ×1 IMPLANT
SEALANT SURG COSEAL 8ML (VASCULAR PRODUCTS) ×1 IMPLANT
SET CARDIOPLEGIA MPS 5001102 (MISCELLANEOUS) ×1 IMPLANT
SHEARS HARMONIC 9CM CVD (BLADE) ×1 IMPLANT
SHEARS HARMONIC STRL 23CM (MISCELLANEOUS) ×4 IMPLANT
SPONGE LAP 18X18 RF (DISPOSABLE) ×1 IMPLANT
SUT BONE WAX W31G (SUTURE) ×4 IMPLANT
SUT MNCRL AB 3-0 PS2 18 (SUTURE) ×9 IMPLANT
SUT PDS AB 1 CTX 36 (SUTURE) ×8 IMPLANT
SUT PROLENE 3 0 SH DA (SUTURE) ×7 IMPLANT
SUT PROLENE 4 0 RB 1 (SUTURE) ×4
SUT PROLENE 4 0 SH DA (SUTURE) ×3 IMPLANT
SUT PROLENE 4-0 RB1 .5 CRCL 36 (SUTURE) IMPLANT
SUT PROLENE 5 0 C 1 36 (SUTURE) IMPLANT
SUT PROLENE 6 0 C 1 30 (SUTURE) ×14 IMPLANT
SUT PROLENE 8 0 BV175 6 (SUTURE) ×1 IMPLANT
SUT PROLENE BLUE 7 0 (SUTURE) ×4 IMPLANT
SUT SILK  1 MH (SUTURE) ×4
SUT SILK 1 MH (SUTURE) IMPLANT
SUT SILK 2 0 SH CR/8 (SUTURE) IMPLANT
SUT SILK 3 0 SH CR/8 (SUTURE) IMPLANT
SUT STEEL 6MS V (SUTURE) ×3 IMPLANT
SUT STEEL SZ 6 DBL 3X14 BALL (SUTURE) ×3 IMPLANT
SUT VIC AB 2-0 CT1 27 (SUTURE) ×4
SUT VIC AB 2-0 CT1 TAPERPNT 27 (SUTURE) IMPLANT
SUT VIC AB 2-0 CTX 27 (SUTURE) IMPLANT
SUT VIC AB 3-0 SH 27 (SUTURE)
SUT VIC AB 3-0 SH 27X BRD (SUTURE) IMPLANT
SUT VIC AB 3-0 X1 27 (SUTURE) IMPLANT
SYR 10ML LL (SYRINGE) ×2 IMPLANT
SYR 30ML LL (SYRINGE) ×4 IMPLANT
SYR 3ML LL SCALE MARK (SYRINGE) ×4 IMPLANT
SYR 50ML SLIP (SYRINGE) IMPLANT
SYSTEM SAHARA CHEST DRAIN ATS (WOUND CARE) ×4 IMPLANT
TAPE CLOTH SURG 4X10 WHT LF (GAUZE/BANDAGES/DRESSINGS) ×1 IMPLANT
TAPE PAPER 2X10 WHT MICROPORE (GAUZE/BANDAGES/DRESSINGS) ×1 IMPLANT
TOWEL GREEN STERILE (TOWEL DISPOSABLE) ×4 IMPLANT
TOWEL GREEN STERILE FF (TOWEL DISPOSABLE) ×3 IMPLANT
TRAY FOLEY SLVR 16FR TEMP STAT (SET/KITS/TRAYS/PACK) ×4 IMPLANT
TUBING LAP HI FLOW INSUFFLATIO (TUBING) ×3 IMPLANT
UNDERPAD 30X30 (UNDERPADS AND DIAPERS) ×5 IMPLANT
WATER STERILE IRR 1000ML POUR (IV SOLUTION) ×8 IMPLANT
WATER STERILE IRR 1000ML UROMA (IV SOLUTION) ×1 IMPLANT

## 2019-08-13 NOTE — H&P (Signed)
History and Physical Interval Note:  08/13/2019 6:58 AM  Shane Massey  has presented today for surgery, with the diagnosis of CAD.  The various methods of treatment have been discussed with the patient and family. After consideration of risks, benefits and other options for treatment, the patient has consented to  Procedure(s): CORONARY ARTERY BYPASS GRAFTING (CABG) (N/A) TRANSESOPHAGEAL ECHOCARDIOGRAM (TEE) (N/A) OPEN RADIAL ARTERY HARVEST (Left) as a surgical intervention.  The patient's history has been reviewed, patient examined, no change in status, stable for surgery.  I have reviewed the patient's chart and labs.  Questions were answered to the patient's satisfaction.     Wonda Olds

## 2019-08-13 NOTE — Addendum Note (Signed)
Addendum  created 08/13/19 1348 by Annye Asa, MD   [place] recorded in [place], [place] filed, Intraprocedure Event edited

## 2019-08-13 NOTE — Anesthesia Procedure Notes (Signed)
Procedure Name: Intubation Date/Time: 08/13/2019 7:34 AM Performed by: Renato Shin, CRNA Pre-anesthesia Checklist: Patient identified, Emergency Drugs available, Suction available and Patient being monitored Patient Re-evaluated:Patient Re-evaluated prior to induction Oxygen Delivery Method: Circle system utilized Preoxygenation: Pre-oxygenation with 100% oxygen Induction Type: IV induction Ventilation: Mask ventilation without difficulty Laryngoscope Size: Miller and 3 Grade View: Grade II Tube type: Oral Tube size: 8.0 mm Number of attempts: 1 Airway Equipment and Method: Stylet and Oral airway Placement Confirmation: ETT inserted through vocal cords under direct vision,  positive ETCO2 and breath sounds checked- equal and bilateral Secured at: 22 cm Tube secured with: Tape Dental Injury: Teeth and Oropharynx as per pre-operative assessment

## 2019-08-13 NOTE — Procedures (Signed)
Extubation Procedure Note  Patient Details:   Name: Shane Massey DOB: 09-Apr-1941 MRN: [id number]   Airway Documentation:  Airway 8 mm (Active)  Secured at (cm) 22 cm 08/13/19 1931  Measured From Lips 08/13/19 1931  Secured Location Right 08/13/19 1931  Secured By Pink Tape 08/13/19 1931  Site Condition Dry 08/13/19 1931   Vent end date: (not recorded) Vent end time: (not recorded)   Evaluation  O2 sats: stable throughout Complications: No apparent complications Patient did tolerate procedure well. Bilateral Breath Sounds: Clear, Diminished   Yes, pt had cuff leak before extubation. RT placed pt on 4L humidified nasal cannula and pt tolerating well at this time. Pt had NIF of -16 Dr. Orvan Seen informed RN to go ahead and extubate at this time.   Virgilio Frees 08/13/2019, 8:36 PM

## 2019-08-13 NOTE — Progress Notes (Signed)
  Echocardiogram Echocardiogram Transesophageal has been performed.  Geoffery Lyons [place] 08/13/2019, 9:01 AM

## 2019-08-13 NOTE — OR Nursing (Signed)
Patient midline sternum closed using Arthrex FiberTape and TigerTape Sternal Closure System: Ref: [id number]- Lot: [id number] / Exp: 02/09/2024 times three, and Ref: [id number]- Lot: [id number] / Exp: 02/09/2024 times four for a total of seven (7) cables wires. An additional Fiber Tape Cerclage Passing Needle, Medium with #2-0 Shuttling Suture (Double Loop) 80 mm was used. This will be noted as Dr. Glenice Bow. Atkins single free use of the Arthex system.

## 2019-08-13 NOTE — Progress Notes (Signed)
Pt placed on CPAP/PS 10/5 40% per rapid wean protocol. At 1747 pt returned to full support settings due to desaturations to 85%. Pt remains groggy, SpO2 recovered to 94%. RT will continue to monitor and will reassess pt for weaning in one hour.

## 2019-08-13 NOTE — Brief Op Note (Signed)
08/12/2019 - 08/13/2019  11:53 AM  PATIENT:  Shane Massey  79 y.o. male  PRE-OPERATIVE DIAGNOSIS:  CORONARY ARTERY DISEASE  POST-OPERATIVE DIAGNOSIS:  CORONARY ARTERY DISEASE, PATENT FOREMEN [place]E  PROCEDURES:   PRIMARY CLOSURE OF PFO CORONARY ARTERY BYPASS GRAFTING X3 TRANSESOPHAGEAL ECHOCARDIOGRAM  OPEN RADIAL ARTERY HARVEST (Left)   LIMA->LAD  RIMA->Ramus Intermediate  Left Radial Artery-> OM1  SURGEON:  Wonda Olds, MD   PHYSICIAN ASSISTANT: Jerilee Space  ANESTHESIA:   general  EBL:  Per anesthesia and perfusion notes   BLOOD ADMINISTERED:none  DRAINS: Left pleural and mediastinal drains   LOCAL MEDICATIONS USED:  Bilateral intercostal Exparel  SPECIMEN:  No Specimen  DISPOSITION OF SPECIMEN:  N/A  COUNTS:  YES  DICTATION: .Dragon Dictation  PLAN OF CARE: Admit to inpatient   PATIENT DISPOSITION:  ICU - intubated and hemodynamically stable.   Delay start of Pharmacological VTE agent (>24hrs) due to surgical blood loss or risk of bleeding: yes

## 2019-08-13 NOTE — Anesthesia Procedure Notes (Signed)
Central Venous Catheter Insertion Performed by: Effie Berkshire, MD, anesthesiologist Start/End3/09/2019 6:45 AM, 08/13/2019 6:50 AM Patient location: Pre-op. Preanesthetic checklist: patient identified, IV checked, site marked, risks and benefits discussed, surgical consent, monitors and equipment checked, pre-op evaluation, timeout performed and anesthesia consent Hand hygiene performed  and maximum sterile barriers used  PA cath was placed.Swan type:thermodilution Procedure performed without using ultrasound guided technique. Attempts: 1 Patient tolerated the procedure well with no immediate complications.

## 2019-08-13 NOTE — Anesthesia Procedure Notes (Signed)
Central Venous Catheter Insertion Performed by: Effie Berkshire, MD, anesthesiologist Start/End3/09/2019 6:35 AM, 08/13/2019 6:45 AM Patient location: Pre-op. Preanesthetic checklist: patient identified, IV checked, site marked, risks and benefits discussed, surgical consent, monitors and equipment checked, pre-op evaluation, timeout performed and anesthesia consent Position: Trendelenburg Lidocaine 1% used for infiltration and patient sedated Hand hygiene performed , maximum sterile barriers used  and Seldinger technique used Catheter size: 8.5 Fr Total catheter length 10. Central line was placed.Sheath introducer Swan type:thermodilution PA Cath depth:50 Procedure performed using ultrasound guided technique. Ultrasound Notes:anatomy identified, needle tip was noted to be adjacent to the nerve/plexus identified, no ultrasound evidence of intravascular and/or intraneural injection and image(s) printed for medical record Attempts: 1 Following insertion, line sutured and dressing applied. Post procedure assessment: blood return through all ports, free fluid flow and no air  Patient tolerated the procedure well with no immediate complications.

## 2019-08-13 NOTE — Anesthesia Postprocedure Evaluation (Signed)
Anesthesia Post Note  Patient: Shane Massey  Procedure(s) Performed: CORONARY ARTERY BYPASS GRAFTING (CABG) TIMES 3. (N/A Chest) TRANSESOPHAGEAL ECHOCARDIOGRAM (TEE) (N/A ) OPEN RADIAL ARTERY HARVEST (Left Arm Lower) CLOSURE OF PATENT FORAMEN OVALE (N/A Chest)     Patient location during evaluation: SICU Anesthesia Type: General Level of consciousness: patient remains intubated per anesthesia plan and sedated Pain management: pain level controlled Vital Signs Assessment: post-procedure vital signs reviewed and stable Respiratory status: patient remains intubated per anesthesia plan and patient on ventilator - see flowsheet for VS Cardiovascular status: stable (CO 4.56) Postop Assessment: no apparent nausea or vomiting Anesthetic complications: no Comments: Pt doing well in SICU    Last Vitals:  Vitals:   08/13/19 0658 08/13/19 1318  BP:    Pulse: 60   Resp: 14   Temp:    SpO2: 98% 98%    Last Pain:  Vitals:   08/13/19 0400  TempSrc:   PainSc: 0-No pain                 Antwyne Pingree,E. Lynea Rollison

## 2019-08-13 NOTE — Op Note (Signed)
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 Murvin Natal, MD  Assistant: Macarthur Critchley PA-C  Anesthesia: get  Operative Findings:  Mildly reduced left ventricular systolic function  Good quality  internal mammary artery conduits  good quality radial artery conduit  good quality target vessels for grafting    BRIEF CLINICAL NOTE AND INDICATIONS FOR SURGERY  79 yo man with 4 year h/o CAD s/p LAD PCI presented with increasing fatigue and DOE. Given his hx, he underwent LHC demonstrating 80% LM CAD. No disease in RCA. Referred for CABG. He was readmitted yesterday due to pre-syncopal episode and is taken to the OR urgently given the sx and anatomy of LM CAD.    DETAILS OF THE OPERATIVE PROCEDURE  Preparation:  The patient is brought to the operating room on the above mentioned date and central monitoring was established by the anesthesia team including placement of Swan-Ganz catheter and radial arterial line. The patient is placed in the supine position on the operating table.  Intravenous antibiotics are administered. General endotracheal anesthesia is induced uneventfully. A Foley catheter is placed.  Baseline transesophageal echocardiogram was performed.  Findings were notable for mildly reduced LV function and mild to moderate MR.   The patient's chest,  abdomen, both groins, left upper extremity, and both lower extremities are prepared and draped in a sterile manner. A time out procedure is performed.   Surgical Approach and Conduit Harvest:  A median sternotomy incision was performed and the left internal mammary artery is dissected from the chest wall and prepared for bypass grafting. The left internal mammary artery is notably good quality conduit. Simultaneously, the left radial artery is obtained by open technique. After removal of the radial, the surgical incision in the upper extremity is closed with absorbable suture. Attention is turned to the right chest where the RIMA is harvested in a standard fashion. Following systemic heparinization, both the right and  left internal mammary arteries were transected distally noted to have excellent flow. They were treated with papaverine. Multilevel rib block was performed by injecting Exparel into the rib spaces visualized during IMA harvesting.    Extracorporeal Cardiopulmonary Bypass and Myocardial Protection:  The pericardium is opened. The ascending aorta is nondiseased in appearance. The ascending aorta and both cavae are cannulated for cardiopulmonary bypass.  Adequate heparinization is verified.    The entire pre-bypass portion of the operation was notable for stable hemodynamics.  Cardiopulmonary bypass was begun and the surface of the heart is inspected. Distal target vessels are selected for coronary artery bypass grafting. A cardioplegia cannula is placed in the ascending aorta.    The patient is allowed to cool passively to 34C systemic temperature.  The aortic cross clamp is applied and cold blood cardioplegia is delivered initially in an antegrade fashion through the aortic root.   Iced saline slush is applied for topical hypothermia.  The initial cardioplegic arrest is rapid with early diastolic arrest.  Repeat doses  of cardioplegia are administered intermittently throughout the entire  cross clamp portion of the operation through the aortic root and through subsequently placed grafts in order to maintain completely flat electrocardiogram.   The right atrial free wall is opened after snaring both cavae. The PFO is identified and closed with running 4-0 prolene suture. The free wall incision is closed by a mattress technique.    Coronary Artery Bypass Grafting:   The 2nd obtuse marginal branch of the left circumflex coronary artery was grafted using the left radial artery graft in an end-to-side fashion.  At the site of distal anastomosis the target vessel was good quality and measured approximately 1.5 mm in diameter.  The 1st obtuse marginal branch of the left circumflex coronary artery (ramus intermedius) was grafted using the pedicled RIMA  graft in an end-to-side fashion.  At the site of distal anastomosis the target vessel was good quality and measured approximately 1.5 mm in diameter. Anastomotic patency and runoff was confirmed with indocyanine green fluorescence imaging (SPY).  The distal left anterior coronary artery was grafted with the left internal mammary artery in an end-to-side fashion.  At the site of distal anastomosis the target vessel was good  quality and measured approximately 1.5 mm in diameter. Anastomotic patency and runoff was confirmed with indocyanine green fluorescence imaging (SPY).  The proximal radial graft anastomosis was placed directly to the ascending aorta prior to removal of the aortic cross clamp.  Deairing procedures were performed, and the aortic cross clamp was removed.  Procedure Completion:  All proximal and distal coronary anastomoses were inspected for hemostasis and appropriate graft orientation. Epicardial pacing wires are fixed to the right ventricular outflow tract and to the right atrial appendage. The patient is rewarmed to 37C temperature. The patient is weaned and disconnected from cardiopulmonary bypass.  The patient's rhythm  at separation from bypass was sinus bradycardia.  The patient was weaned from cardiopulmonary bypass  without any inotropic support.  Followup transesophageal echocardiogram performed after separation from bypass revealed  no changes from the preoperative exam.  The aortic and venous cannula were removed uneventfully. Protamine was administered to reverse the anticoagulation. The mediastinum and pleural space were inspected for hemostasis and irrigated with saline solution. The mediastinum and bilateral pleural spaces were drained using fluted chest tubes placed through separate stab incisions inferiorly.  The soft tissues anterior to the aorta were reapproximated loosely. The sternum is closed with double strength sternal wire. The soft tissues anterior to the sternum were closed in multiple layers and the skin is closed with a running subcuticular skin closure.  The post-bypass portion of the operation was notable for stable rhythm and hemodynamics.  No blood products were administered during the operation.   Disposition:  The patient tolerated the procedure well and is transported to the surgical intensive care in stable condition. There are no intraoperative complications. All sponge instrument and needle counts are verified correct at completion of the operation.    Jayme Cloud, MD 08/13/2019 6:01 PM

## 2019-08-13 NOTE — Anesthesia Procedure Notes (Signed)
Arterial Line Insertion Start/End3/09/2019 7:02 AM, 08/13/2019 7:02 AM Performed by: CRNA  Patient location: Pre-op. Preanesthetic checklist: patient identified, IV checked, site marked, risks and benefits discussed, surgical consent, monitors and equipment checked, pre-op evaluation, timeout performed and anesthesia consent Lidocaine 1% used for infiltration Right, radial was placed Catheter size: 20 G Hand hygiene performed , maximum sterile barriers used  and Seldinger technique used Allen's test indicative of satisfactory collateral circulation Attempts: 1 Procedure performed without using ultrasound guided technique. Following insertion, dressing applied and Biopatch. Post procedure assessment: normal  Post procedure complications: hemorrhage. Patient tolerated the procedure well with no immediate complications.

## 2019-08-13 NOTE — Transfer of Care (Signed)
Immediate Anesthesia Transfer of Care Note  Patient: Shane Massey  Procedure(s) Performed: CORONARY ARTERY BYPASS GRAFTING (CABG) TIMES 3. (N/A Chest) TRANSESOPHAGEAL ECHOCARDIOGRAM (TEE) (N/A ) OPEN RADIAL ARTERY HARVEST (Left Arm Lower) CLOSURE OF PATENT FORAMEN OVALE (N/A Chest)  Patient Location: SICU  Anesthesia Type:General  Level of Consciousness: sedated and Patient remains intubated per anesthesia plan  Airway & Oxygen Therapy: Patient remains intubated per anesthesia plan and Patient placed on Ventilator (see vital sign flow sheet for setting)  Post-op Assessment: Report given to RN and Post -op Vital signs reviewed and stable  Post vital signs: Reviewed and stable  Last Vitals:  Vitals Value Taken Time  BP    Temp    Pulse    Resp    SpO2      Last Pain:  Vitals:   08/13/19 0400  TempSrc:   PainSc: 0-No pain         Complications: No apparent anesthesia complications

## 2019-08-14 ENCOUNTER — Inpatient Hospital Stay (HOSPITAL_COMMUNITY): Payer: PPO

## 2019-08-14 ENCOUNTER — Encounter: Payer: Self-pay | Admitting: *Deleted

## 2019-08-14 DIAGNOSIS — Z951 Presence of aortocoronary bypass graft: Secondary | ICD-10-CM

## 2019-08-14 LAB — POCT I-STAT 7, (LYTES, BLD GAS, ICA,H+H)
Acid-base deficit: 4 mmol/L — ABNORMAL HIGH (ref 0.0–2.0)
Bicarbonate: 21.5 mmol/L (ref 20.0–28.0)
Calcium, Ion: 1.18 mmol/L (ref 1.15–1.40)
HCT: 27 % — ABNORMAL LOW (ref 39.0–52.0)
Hemoglobin: 9.2 g/dL — ABNORMAL LOW (ref 13.0–17.0)
O2 Saturation: 98 %
Patient temperature: 37.4
Potassium: 4.7 mmol/L (ref 3.5–5.1)
Sodium: 139 mmol/L (ref 135–145)
TCO2: 23 mmol/L (ref 22–32)
pCO2 arterial: 41.6 mmHg (ref 32.0–48.0)
pH, Arterial: 7.323 — ABNORMAL LOW (ref 7.350–7.450)
pO2, Arterial: 118 mmHg — ABNORMAL HIGH (ref 83.0–108.0)

## 2019-08-14 LAB — CBC
HCT: 27.8 % — ABNORMAL LOW (ref 39.0–52.0)
HCT: 27.7 % — ABNORMAL LOW (ref 39.0–52.0)
Hemoglobin: 9 g/dL — ABNORMAL LOW (ref 13.0–17.0)
Hemoglobin: 8.9 g/dL — ABNORMAL LOW (ref 13.0–17.0)
MCH: 31.3 pg (ref 26.0–34.0)
MCH: 31.2 pg (ref 26.0–34.0)
MCHC: 32.4 g/dL (ref 30.0–36.0)
MCHC: 32.1 g/dL (ref 30.0–36.0)
MCV: 96.5 fL (ref 80.0–100.0)
MCV: 97.2 fL (ref 80.0–100.0)
Platelets: 110 10*3/uL — ABNORMAL LOW (ref 150–400)
Platelets: 98 10*3/uL — ABNORMAL LOW (ref 150–400)
RBC: 2.88 MIL/uL — ABNORMAL LOW (ref 4.22–5.81)
RBC: 2.85 MIL/uL — ABNORMAL LOW (ref 4.22–5.81)
RDW: 13.5 % (ref 11.5–15.5)
RDW: 13.6 % (ref 11.5–15.5)
WBC: 7.6 10*3/uL (ref 4.0–10.5)
WBC: 6.6 10*3/uL (ref 4.0–10.5)
nRBC: 0 % (ref 0.0–0.2)
nRBC: 0 % (ref 0.0–0.2)

## 2019-08-14 LAB — BASIC METABOLIC PANEL
Anion gap: 8 (ref 5–15)
Anion gap: 6 (ref 5–15)
BUN: 13 mg/dL (ref 8–23)
BUN: 13 mg/dL (ref 8–23)
CO2: 22 mmol/L (ref 22–32)
CO2: 23 mmol/L (ref 22–32)
Calcium: 8 mg/dL — ABNORMAL LOW (ref 8.9–10.3)
Calcium: 8.1 mg/dL — ABNORMAL LOW (ref 8.9–10.3)
Chloride: 108 mmol/L (ref 98–111)
Chloride: 107 mmol/L (ref 98–111)
Creatinine, Ser: 0.74 mg/dL (ref 0.61–1.24)
Creatinine, Ser: 0.77 mg/dL (ref 0.61–1.24)
GFR calc Af Amer: >60 mL/min (ref >60)
GFR calc Af Amer: >60 mL/min (ref >60)
GFR calc non Af Amer: >60 mL/min (ref >60)
GFR calc non Af Amer: >60 mL/min (ref >60)
Glucose, Bld: 122 mg/dL — ABNORMAL HIGH (ref 70–99)
Glucose, Bld: 105 mg/dL — ABNORMAL HIGH (ref 70–99)
Potassium: 4.9 mmol/L (ref 3.5–5.1)
Potassium: 4.5 mmol/L (ref 3.5–5.1)
Sodium: 138 mmol/L (ref 135–145)
Sodium: 136 mmol/L (ref 135–145)

## 2019-08-14 LAB — MAGNESIUM
Magnesium: 2.4 mg/dL (ref 1.7–2.4)
Magnesium: 2.8 mg/dL — ABNORMAL HIGH (ref 1.7–2.4)

## 2019-08-14 LAB — GLUCOSE, CAPILLARY
Glucose-Capillary: 109 mg/dL — ABNORMAL HIGH (ref 70–99)
Glucose-Capillary: 133 mg/dL — ABNORMAL HIGH (ref 70–99)
Glucose-Capillary: 122 mg/dL — ABNORMAL HIGH (ref 70–99)
Glucose-Capillary: 91 mg/dL (ref 70–99)
Glucose-Capillary: 142 mg/dL — ABNORMAL HIGH (ref 70–99)
Glucose-Capillary: 107 mg/dL — ABNORMAL HIGH (ref 70–99)

## 2019-08-14 MED ORDER — PROMETHAZINE HCL 25 MG/ML IJ SOLN: 6.2500 mg | Freq: Four times a day (QID) | INTRAMUSCULAR | Status: DC | PRN

## 2019-08-14 MED ORDER — ASPIRIN 81 MG PO CHEW
81.0000 mg | CHEWABLE_TABLET | Freq: Every day | ORAL | Status: DC
  Administered 2019-08-14 – 2019-08-19 (×6): 81 mg via ORAL
  Filled 2019-08-14 (×6): qty 1

## 2019-08-14 MED ORDER — ORAL CARE MOUTH RINSE
15.0000 mL | Freq: Two times a day (BID) | OROMUCOSAL | Status: DC
  Administered 2019-08-14 – 2019-08-19 (×10): 15 mL via OROMUCOSAL

## 2019-08-14 MED ORDER — ISOSORBIDE DINITRATE 10 MG PO TABS
5.0000 mg | ORAL_TABLET | Freq: Three times a day (TID) | ORAL | Status: DC
  Administered 2019-08-14 – 2019-08-19 (×16): 5 mg via ORAL
  Filled 2019-08-14 (×16): qty 1

## 2019-08-14 MED ORDER — COLCHICINE 0.3 MG HALF TABLET
0.3000 mg | ORAL_TABLET | Freq: Two times a day (BID) | ORAL | Status: DC
  Administered 2019-08-14 – 2019-08-19 (×10): 0.3 mg via ORAL
  Filled 2019-08-14 (×12): qty 1

## 2019-08-14 MED ORDER — METOCLOPRAMIDE HCL 5 MG/ML IJ SOLN
5.0000 mg | Freq: Three times a day (TID) | INTRAMUSCULAR | Status: AC
  Administered 2019-08-14 (×3): 5 mg via INTRAVENOUS
  Filled 2019-08-14 (×3): qty 2

## 2019-08-14 MED ORDER — CLOPIDOGREL BISULFATE 75 MG PO TABS
75.0000 mg | ORAL_TABLET | Freq: Every day | ORAL | Status: DC
  Administered 2019-08-14 – 2019-08-18 (×5): 75 mg via ORAL
  Filled 2019-08-14 (×5): qty 1

## 2019-08-14 NOTE — Progress Notes (Signed)
CT surgery p.m. Rounds  Patient had stable day-up to chair Atrially pacing for sinus bradycardia-holding metoprolol Hemodynamics stable Breathing comfortably on nasal cannula P.m. labs reviewed-potassium 4.5, creatinine 0.8 and hemoglobin 8.9

## 2019-08-14 NOTE — Progress Notes (Signed)
[facility] DAILY ICU PROGRESS NOTE                   [address]            [place] [address]          [id number]   1 Day Post-Op Procedure(s) (LRB): CORONARY ARTERY BYPASS GRAFTING (CABG) TIMES 3. (N/A) TRANSESOPHAGEAL ECHOCARDIOGRAM (TEE) (N/A) OPEN RADIAL ARTERY HARVEST (Left) CLOSURE OF PATENT FORAMEN OVALE (N/A)  Total Length of Stay:  LOS: 2 days   Subjective: Awake and alert, sitting up and taking clear liquids. Pain controlled.   Extubated last PM. No vasoactive drips except low-dose NTG for the radial graft.   Objective: Vital signs in last 24 hours: Temp:  [97.2 F (36.2 C)-101.5 F (38.6 C)] 98.8 F (37.1 C) (03/05 0700) Pulse Rate:  [79-113] 80 (03/05 0700) Cardiac Rhythm: Atrial paced (03/05 0400) Resp:  [10-29] 15 (03/05 0700) BP: (97-136)/(53-92) 131/69 (03/05 0700) SpO2:  [81 %-100 %] 98 % (03/05 0700) Arterial Line BP: (111-166)/(39-75) 144/51 (03/05 0700) FiO2 (%):  [40 %-50 %] 40 % (03/04 2000) Weight:  [83.3 kg-86.5 kg] 86.5 kg (03/05 0500)  Filed Weights   08/13/19 1234 08/14/19 0500  Weight: 83.3 kg 86.5 kg    Weight change:    Hemodynamic parameters for last 24 hours: PAP: (20-40)/(5-20) 29/9 CO:  [3.8 L/min-9.2 L/min] 5.7 L/min CI:  [1.9 L/min/m2-4.6 L/min/m2] 2.8 L/min/m2  Intake/Output from previous day: 03/04 0701 - 03/05 0700 In: 7402.3 [I.V.:3729.5; Blood:350; IV Piggyback:2322.8] Out: 4250 [Urine:2730; Emesis/NG output:30; Blood:650; Chest Tube:840]  Intake/Output this shift: No intake/output data recorded.  Current Meds: Scheduled Meds: . acetaminophen  1,000 mg Oral Q6H   Or  . acetaminophen (TYLENOL) oral liquid 160 mg/5 mL  1,000 mg Per Tube Q6H  . aspirin  81 mg Oral Daily  . bisacodyl  10 mg Oral Daily   Or  . bisacodyl  10 mg Rectal Daily  . Chlorhexidine Gluconate Cloth  6 each Topical Daily  . clopidogrel  75 mg Oral Daily  . docusate sodium  200 mg Oral Daily  . insulin aspart  0-24 Units  Subcutaneous Q4H  . isosorbide dinitrate  5 mg Oral TID  . ketorolac  7.5 mg Intravenous Q6H  . ketorolac      . levothyroxine  50 mcg Oral Q0600  . metoCLOPramide (REGLAN) injection  5 mg Intravenous Q8H  . metoprolol tartrate  12.5 mg Oral BID   Or  . metoprolol tartrate  12.5 mg Per Tube BID  . [START ON 08/15/2019] pantoprazole  40 mg Oral Daily  . rosuvastatin  20 mg Oral QPM  . sodium chloride flush  3 mL Intravenous Q12H   Continuous Infusions: . sodium chloride 10 mL/hr at 08/14/19 0700  . sodium chloride    . sodium chloride    . cefUROXime (ZINACEF)  IV Stopped (08/14/19 0650)  . famotidine (PEPCID) IV Stopped (08/13/19 1435)  . lactated ringers    . lactated ringers Stopped (08/13/19 1530)  . lactated ringers 20 mL/hr at 08/14/19 0700  . niCARDipine Stopped (08/13/19 1808)  . nitroGLYCERIN 10 mcg/min (08/14/19 0700)  . phenylephrine (NEO-SYNEPHRINE) Adult infusion Stopped (08/13/19 1529)   PRN Meds:.sodium chloride, lactated ringers, metoprolol tartrate, midazolam, ondansetron (ZOFRAN) IV, oxyCODONE, promethazine, sodium chloride flush, traMADol  General appearance: alert, cooperative and no distress Neurologic: intact Heart: currently A-paced at 80/min with good capture. Rated slowly decreased to 40/min with no intrinsic complexes seen. Hemodynamics  have been stable.  Lungs: Breath sounds shallow, clear anterior. Moderate CT drainage.  Abdomen: Soft, non-tender. Absent bowel sounds.  Extremities: Mild peripheral edema, left hand well perfused with normal sensation. the left arm incision is covered with a dry dressing.  Wound: the sternal incision is covered with a dry Aquacel dressing.   Lab Results: CBC: Recent Labs    08/13/19 1857 08/13/19 2012 08/14/19 0419 08/14/19 0601  WBC 8.1  --  7.6  --   HGB 10.1*   < > 9.0* 9.2*  HCT 30.8*   < > 27.8* 27.0*  PLT 134*  --  110*  --    < > = values in this interval not displayed.   BMET:  Recent Labs     08/13/19 1857 08/13/19 2012 08/14/19 0419 08/14/19 0601  NA 138   < > 138 139  K 4.7   < > 4.9 4.7  CL 112*  --  108  --   CO2 21*  --  22  --   GLUCOSE 117*  --  122*  --   BUN 12  --  13  --   CREATININE 0.68  --  0.74  --   CALCIUM 7.7*  --  8.0*  --    < > = values in this interval not displayed.    CMET: Lab Results  Component Value Date   WBC 7.6 08/14/2019   HGB 9.2 (L) 08/14/2019   HCT 27.0 (L) 08/14/2019   PLT 110 (L) 08/14/2019   GLUCOSE 122 (H) 08/14/2019   CHOL 114 08/13/2019   TRIG 125 08/13/2019   HDL 46 08/13/2019   LDLCALC 43 08/13/2019   ALT 21 08/12/2019   AST 24 08/12/2019   NA 139 08/14/2019   K 4.7 08/14/2019   CL 108 08/14/2019   CREATININE 0.74 08/14/2019   BUN 13 08/14/2019   CO2 22 08/14/2019   TSH 2.020 12/10/2018   INR 1.4 (H) 08/13/2019   HGBA1C 5.7 (H) 08/13/2019      PT/INR:  Recent Labs    08/13/19 1345  LABPROT 16.7*  INR 1.4*   Radiology: [facility] - In[facility] Chest Port 1 View  Result Date: 08/13/2019 CLINICAL DATA:  Coronary artery disease. Status post CABG. EXAM: PORTABLE CHEST 1 VIEW COMPARISON:  Chest x-ray dated 08/12/2019 FINDINGS: Endotracheal tube in good position 3.5 cm above the carina. NG tube tip below the diaphragm. Swan-Ganz catheter tip in the main pulmonary artery. 3 chest tubes in place. Tiny right apical pneumothorax. Slight atelectasis at the lung bases. No effusions. Heart size and vascularity are normal. No bone abnormality. IMPRESSION: Tiny right apical pneumothorax.  Slight bibasilar atelectasis. Electronically Signed   By: Lorriane Shire M.D.   On: 08/13/2019 13:56     Assessment/Plan: S/P Procedure(s) (LRB): CORONARY ARTERY BYPASS GRAFTING (CABG) TIMES 3. (N/A) TRANSESOPHAGEAL ECHOCARDIOGRAM (TEE) (N/A) OPEN RADIAL ARTERY HARVEST (Left) CLOSURE OF PATENT FORAMEN OVALE (N/A)  -POD-1 ACBG x 3 for MVCAD and repair of PFO presenting with DOE. Pre-op EF 35-40%. Stable hemodynamics with no inotropic support. Convert to  oral nitrate for radial graft protection. D/C a-line and PA catheter. Mobilize.   -Bradycardia- A-pacing with appropriate capture. Avoid B-blocker.   -Expected acute blood loss anemia- No indication for transfusion. Monitor. CT drainage tapering off.   -Dyslipidemia- resume statin.     Antony Odea, PA-C [phone] 08/14/2019 7:38 AM

## 2019-08-14 NOTE — Addendum Note (Signed)
Addendum  created 08/14/19 1003 by Josephine Igo, CRNA   Order list changed

## 2019-08-14 NOTE — Discharge Summary (Signed)
Physician Discharge Summary       [address]       [place] [address]             [phone]    Patient ID: Shane Massey MRN: [id number] DOB/AGE: 1940-10-08 79 y.o.  Admit date: 08/12/2019 Discharge date: 08/19/2019  Admission Diagnoses: CAD (coronary artery disease)  Discharge Diagnoses:  1. 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 Expected acute blood loss anemia 3. History of OSA (obstructive sleep apnea) 4. History of hypertension 5. History of Myocardial infarction ([place]) 6. History of hypothyroidism 7. History of GERD (gastroesophageal reflux disease) 8. History of tobacco abuse 9. Paroxysmal atrial fibrillation  Consults: Cardiology (EP)  Procedure (s):   Coronary Artery Bypass Grafting x 3              Left Internal Mammary Artery to Distal Left Anterior Descending Coronary Artery; left radial artery to 2nd Obtuse Marginal Branch of Left Circumflex Coronary Artery; pedicled RIMA to ramus intermedius  Coronary Artery; Open left radial artery Harvest; bilateral internal mammary artery harvesting; completion graft surveillance with indocyanine green fluorescence imaging (SPY);  Multilevel rib block with Exparel solution  Closure of PFO by Dr. Orvan Massey on 08/13/2019.  History of Presenting Illness: This is a 79 year old gentleman with a history of coronary artery disease status post stenting of the LAD artery approximately 4 years ago.  The patient presented electively to Dr. Agustin Massey with symptoms of weakness and easy fatigability.  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  Given his known history of coronary disease and the unusual nature of the symptoms to recommendation was to proceed directly to cardiac catheterization.  This was performed last week and demonstrated 75 to 80% left main coronary artery disease with some distal obstruction as well.  The patient was allowed to return to home for the  weekend but presents today for surgical evaluation.  He denies rest pain.  Mostly, his symptoms are shortness of breath and easy fatigue with exertion.  He denies edema or orthopnea.  He has no history of strokes or TIAs.  Importantly, he stopped his Plavix a few days ago in preparation for potential surgery.  Dr. Orvan Massey suggest relatively urgent coronary artery bypass grafting.  This is due to the severity of left main disease.  The patient and his wife are in agreement.  Dr. Orvan Massey explained the risks and benefits of the procedure and their questions have been answered to their satisfaction.  Pre operative carotid duplex US showed no significant internal carotid artery stenosis bilaterally. Patient presented for CABG x 3 and closure of PFO on 08/13/2019.  Brief Hospital Course:  The patient was extubated the evening of surgery without difficulty. He remained afebrile and hemodynamically stable. Shane Massey, a line, chest tubes, and foley were removed early in the post operative course. He was not on inotrope's post op. He was stared on Isordil 5 mg tid for radial artery harvest. He was A paced initially.  He was restarted on Plavix 75 mg daily and baby ec asa. He was volume over loaded and diuresed. He had ABL anemia. He did not require a post op transfusion. Last H and H was 9.1 and 27%. He was weaned off the insulin drip.   The patient's glucose remained well controlled.The patient's HGA1C pre op was 5.7.  He developed Atrial Fibrillation.  He was treated with Amiodarone and successfully converted  to NSR.  The patient was felt surgically stable for transfer from the ICU to [facility] for further convalescence on 08/16/19 .He continues to progress with cardiac rehab. He had a few more bursts of atrial fibrillation so the decision was made to begin oral anticoagulation. His Plavix and Lovenox were discontinued and Eliquis was initiated. He initially required several liters of oxygen via [place] but was later weaned to  room air. He has been tolerating a diet and has had a bowel movement. Epicardial pacing wires were removed on 03/09. He was noted to have some serous drainage from the lower end of the incision. This had no purulence and the sternum was stable. He was is=nstructed regarding wound care and twice daily dressing changes until this resolves.  The wound will be re-assessed and the chest tube sutures will be removed in the office in 5 days. The patient is felt surgically stable for discharge today.   Latest Vital Signs: Blood pressure 130/69, pulse 63, temperature 98.1 F (36.7 C), temperature source Oral, resp. rate 20, height 5\' 10"  (1.778 m), weight 85.3 kg, SpO2 97 %.  Physical Exam General appearance:alert, cooperative and mild distress Neurologic:intact Heart:Back in A fib this AM with CVR. Mostly SR yesterday and last night.  Lungs:Breath sounds are clear. Abdomen:soft and non-tender. Extremities:Minimal LE edema, left arm incision intact and dry. Wound:the sternal incision has a focus of serous drainage about 10cm from the lower end of the incision. This is not purulent, the sternum is stable.   O/W intact and dry.  Discharge Condition: Stable and discharged home.  Recent laboratory studies:  Lab Results  Component Value Date   WBC 7.6 08/17/2019   HGB 9.1 (L) 08/17/2019   HCT 27.3 (L) 08/17/2019   MCV 93.2 08/17/2019   PLT 180 08/17/2019   Lab Results  Component Value Date   NA 137 08/17/2019   K 3.8 08/17/2019   CL 103 08/17/2019   CO2 23 08/17/2019   CREATININE 0.61 08/17/2019   GLUCOSE 124 (H) 08/17/2019      Diagnostic Studies: DG Chest 2 View  Result Date: 08/12/2019 CLINICAL DATA:  Preop for coronary artery bypass surgery. EXAM: CHEST - 2 VIEW COMPARISON:  Chest x-ray 07/31/2019 FINDINGS: The cardiac silhouette, mediastinal and hilar contours are normal. Coronary artery stents are noted. The lungs are clear of an acute process. No pulmonary lesions. No pleural  effusions. The bony thorax is intact. IMPRESSION: No acute cardiopulmonary findings. Electronically Signed   By: Shane Massey M.D.   On: 08/12/2019 13:14   CARDIAC CATHETERIZATION  Result Date: 08/06/2019  Colon Flattery RCA lesion is 20% stenosed.  Prox RCA lesion is 20% stenosed.  Dist RCA lesion is 30% stenosed.  Mid LM to Dist LM lesion is 75% stenosed.  Ramus lesion is 80% stenosed.  1st Diag lesion is 50% stenosed.  Mid LAD-2 lesion is 5% stenosed.  Mid LAD-1 lesion is 20% stenosed.  Dist LAD lesion is 30% stenosed.  The left ventricular systolic function is normal.  LV end diastolic pressure is normal.  Severe 75% focal distal left main stenosis immediately proximal to its trifurcation into a moderate size LAD with 50% stenosis in the first diagonal vessel, 20% stenosis prior to the mid LAD stent which is patent with 30% stenosis after the stented segment; 80% stenosis in the superior branch of the ramus intermediate vessel and normal-appearing left circumflex coronary artery. RCA is a dominant vessel which has a smooth ostial tapering of 20% followed by  20% proximal and 30% distal stenoses. LV function segment at 55%.  LVEDP 12 mm. RECOMMENDATION: Recommend CABG revascularization surgery.  The patient has been on Plavix.  He was instructed to hold his Plavix as of today.  He will be discharged later today on medical therapy and is scheduled to see Dr. Rodena Goldmann on Monday with plans for CABG revascularization next week.   DG Chest Port 1 View  Result Date: 08/16/2019 CLINICAL DATA:  CABG EXAM: PORTABLE CHEST 1 VIEW COMPARISON:  08/15/2019 FINDINGS: Right IJ sheath remains present. Interval chest tube removal. Bilateral pleural effusions and bibasilar atelectasis. No pneumothorax. Stable cardiomediastinal contours. IMPRESSION: Similar bilateral pleural effusions and bibasilar atelectasis. No pneumothorax. Electronically Signed   By: Macy Mis M.D.   On: 08/16/2019 06:16   DG Chest Port 1  View  Result Date: 08/15/2019 CLINICAL DATA:  Postop day 2 CABG. EXAM: PORTABLE CHEST 1 VIEW COMPARISON:  08/14/2019 and earlier. FINDINGS: Interval extubation, Swan-Ganz catheter removal, and nasogastric tube removal. RIGHT jugular introducer sheath tip projects over the upper SVC. LEFT chest tube in place with no convincing residual LEFT pneumothorax. BILATERAL pleural effusions and associated atelectasis in the lung bases, unchanged. Linear atelectasis in the LEFT mid lung, unchanged. No new pulmonary parenchymal abnormalities. IMPRESSION: 1. LEFT chest tube in place with no convincing residual LEFT pneumothorax. 2. Stable BILATERAL pleural effusions and associated atelectasis in the lung bases. Stable linear atelectasis in the LEFT mid lung. 3. No new abnormalities. Electronically Signed   By: Evangeline Dakin M.D.   On: 08/15/2019 09:00   DG Chest Port 1 View  Result Date: 08/14/2019 CLINICAL DATA:  Chest tube, post CABG EXAM: PORTABLE CHEST 1 VIEW COMPARISON:  Chest radiograph from one day prior. FINDINGS: Right internal jugular Swan-Ganz catheter is stable with tip over the main pulmonary artery. Stable bilateral chest tubes and mediastinal drain. Interval extubation and removal of enteric tube. Stable cardiomediastinal silhouette with normal heart size. No appreciable residual right pneumothorax. Probable tiny less than 5% left apical pneumothorax, not definitely Massey on prior. Stable trace bilateral pleural effusions. No pulmonary edema. Mild bibasilar atelectasis appears decreased on the right and stable on the left. IMPRESSION: 1. Probable tiny left apical pneumothorax, not definitely Massey on prior. No appreciable residual right pneumothorax. Stable bilateral chest tube positions. 2. Stable trace bilateral pleural effusions. 3. Mild bibasilar atelectasis, decreased on the right and stable on the left. Electronically Signed   By: Ilona Sorrel M.D.   On: 08/14/2019 09:01   DG Chest Port 1  View  Result Date: 08/13/2019 CLINICAL DATA:  Coronary artery disease. Status post CABG. EXAM: PORTABLE CHEST 1 VIEW COMPARISON:  Chest x-ray dated 08/12/2019 FINDINGS: Endotracheal tube in good position 3.5 cm above the carina. NG tube tip below the diaphragm. Swan-Ganz catheter tip in the main pulmonary artery. 3 chest tubes in place. Tiny right apical pneumothorax. Slight atelectasis at the lung bases. No effusions. Heart size and vascularity are normal. No bone abnormality. IMPRESSION: Tiny right apical pneumothorax.  Slight bibasilar atelectasis. Electronically Signed   By: Lorriane Shire M.D.   On: 08/13/2019 13:56   ECHOCARDIOGRAM COMPLETE  Result Date: 08/17/2019    ECHOCARDIOGRAM REPORT   Patient Name:   Shane Massey Date of Exam: 08/17/2019 Medical Rec #:  [id number]      Height:       70.0 in Accession #:    [id number]     Weight:       185.7 lb Date of  Birth:  1941/06/09      BSA:          2.023 m Patient Age:    61 years       BP:           120/66 mmHg Patient Gender: M              HR:           80 bpm. 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 4 days post-CABG.  Sonographer:    Raquel Sarna Senior RDCS Referring Phys: [place]  1. Mild global reduction in LV systolic function; mild LVH and LVE.  2. Left ventricular ejection fraction, by estimation, is 45 to 50%. The left ventricle has mildly decreased function. The left ventricle demonstrates global hypokinesis. The left ventricular internal cavity size was mildly dilated. There is mild left ventricular hypertrophy. Left ventricular diastolic function could not be evaluated.  3. Right ventricular systolic function is normal. The right ventricular size is normal.  4. The mitral valve is normal in structure. Trivial mitral valve  regurgitation. No evidence of mitral stenosis.  5. The aortic valve is tricuspid. Aortic valve regurgitation is not visualized. Mild aortic valve sclerosis is present, with no evidence of aortic valve stenosis.  6. The inferior vena cava is dilated in size with <50% respiratory variability, suggesting right atrial pressure of 15 mmHg. FINDINGS  Left Ventricle: Left ventricular ejection fraction, by estimation, is 45 to 50%. The left ventricle has mildly decreased function. The left ventricle demonstrates global hypokinesis. The left ventricular internal cavity size was mildly dilated. There is  mild left ventricular hypertrophy. Left ventricular diastolic function could not be evaluated due to atrial fibrillation. Left ventricular diastolic function could not be evaluated. Right Ventricle: The right ventricular size is normal. Right ventricular systolic function is normal. Left Atrium: Left atrial size was normal in size. Right Atrium: Right atrial size was normal in size. Pericardium: There is no evidence of pericardial effusion. Mitral Valve: The mitral valve is normal in structure. Normal mobility of the mitral valve leaflets. Mild mitral annular calcification. Trivial mitral valve regurgitation. No evidence of mitral valve stenosis. Tricuspid Valve: The tricuspid valve is normal in structure. Tricuspid valve regurgitation is trivial. No evidence of tricuspid stenosis. Aortic Valve: The aortic valve is tricuspid. Aortic valve regurgitation is not visualized. Mild aortic valve sclerosis is present, with no evidence of aortic valve stenosis. Pulmonic Valve: The pulmonic valve was normal in structure. Pulmonic valve regurgitation is trivial. No evidence of pulmonic stenosis. Aorta: The aortic root is normal in size and structure. Venous: The inferior vena cava is dilated in size with less than 50% respiratory variability, suggesting right atrial pressure of 15 mmHg.  Additional Comments: Mild global reduction in LV  systolic function; mild LVH and LVE.  LEFT VENTRICLE PLAX 2D LVIDd:         5.40 cm LVIDs:         3.80 cm LV PW:         1.20 cm LV IVS:        1.20 cm LVOT diam:     2.20 cm LV SV:  48 LV SV Index:   24 LVOT Area:     3.80 cm  RIGHT VENTRICLE RV S prime:     4.80 cm/s TAPSE (M-mode): 1.2 cm LEFT ATRIUM             Index       RIGHT ATRIUM           Index LA diam:        4.60 cm 2.27 cm/m  RA Area:     16.00 cm LA Vol (A2C):   82.0 ml 40.54 ml/m RA Volume:   37.80 ml  18.69 ml/m LA Vol (A4C):   59.8 ml 29.57 ml/m LA Biplane Vol: 70.8 ml 35.01 ml/m  AORTIC VALVE LVOT Vmax:   78.00 cm/s LVOT Vmean:  54.200 cm/s LVOT VTI:    0.126 m  AORTA Ao Root diam: 3.20 cm Ao Asc diam:  3.40 cm  SHUNTS Systemic VTI:  0.13 m Systemic Diam: 2.20 cm Kirk Ruths MD Electronically signed by Kirk Ruths MD Signature Date/Time: 08/17/2019/2:35:16 PM    Final    ECHO INTRAOPERATIVE TEE  Result Date: 08/13/2019  *INTRAOPERATIVE TRANSESOPHAGEAL REPORT *  Patient Name:   Shane Massey Date of Exam: 08/13/2019 Medical Rec #:  [id number]      Height:       70.0 in Accession #:    [id number]     Weight:       183.6 lb Date of Birth:  24-Jul-1940      BSA:          2.01 m Patient Age:    47 years       BP:           135/62 mmHg Patient Gender: M              HR:           47 bpm. Exam Location:  Anesthesiology Transesophogeal exam was perform intraoperatively during surgical procedure. Patient was closely monitored under general anesthesia during the entirety of examination. Indications:     Coronary artery disease Sonographer:     Vickie Epley RDCS Performing Phys: Annye Asa MD Diagnosing Phys: Annye Asa MD Report CC'd to:  Fredrich Romans MD Complications: No known complications during this procedure. POST-OP IMPRESSIONS - Left Ventricle: The left ventricle is unchanged from pre-bypass. Contractility remains globally hypokinetic, returning to EF approximately 35-40%. - Aortic Valve: The aortic valve appears  unchanged from pre-bypass. No AI is Massey. - Mitral Valve: The mitral valve appears unchanged from pre-bypass. Mild MR remains. - Tricuspid Valve: The tricuspid valve appears unchanged from pre-bypass. Mild TR remains. - Interatrial Septum: The PFO is no longer evident on color doppler interrogation. PRE-OP FINDINGS  Left Ventricle: The left ventricle has moderately reduced systolic function, with an ejection fraction of 35-40%, calculated 35-39%. The cavity size was mildly dilated. There is no significant increase in left ventricular wall thickness. Left ventricular diffuse hypokinesis. Right Ventricle: The right ventricle has normal systolic function. The cavity was normal. There is no increase in right ventricular wall thickness. Right ventricular systolic pressure is normal. Left Atrium: Left atrial size was mildly dilated. The left atrial appendage is well visualized and there is no evidence of thrombus present. Left atrial appendage velocity is slightly reduced. Right Atrium: Right atrial size was normal in size. Right atrial pressure is estimated at 10 mmHg. Interatrial Septum: Evidence of atrial level shunting detected by color flow Doppler. A small patent foramen ovale is detected on  color doppler interrogation, with predominantly left to right shunting across the atrial septum. Pericardium: There is no evidence of pericardial effusion. Mitral Valve: The mitral valve is normal in structure. No thickening of the mitral valve leaflet. No calcification of the mitral valve leaflet. Mitral valve regurgitation is mild by color flow Doppler. The MR jet is centrally-directed. There is no evidence of mitral valve vegetation. Tricuspid Valve: The tricuspid valve was normal in structure. Tricuspid valve regurgitation is trivial by color flow Doppler. There is no evidence of tricuspid valve vegetation. Aortic Valve: The aortic valve is tricuspid. There is normal thickness of the aortic valve leaflets. Aortic valve  regurgitation is trivial by color flow Doppler. The jet is centrally-directed. There is no stenosis of the aortic valve. There is no evidence of a vegetation on the aortic valve. Pulmonic Valve: The pulmonic valve was normal in structure, with normal leaflet excursion. Pulmonic valve regurgitation is trivial, around the PA catheter, by color flow Doppler. Aorta: The is normal in size and structure. There is evidence of plaque in the aortic arch; Grade I, measuring 1-9mm in size. Pulmonary Artery: Shane Massey catheter present on the left. The pulmonary artery is of normal size. Venous: The inferior vena cava is normal in size with greater than 50% respiratory variability, suggesting right atrial pressure of 3 mmHg. +--------------+-------++ LEFT VENTRICLE        +--------------+-------++ PLAX 2D               +--------------+-------++ LVIDd:        5.49 cm +--------------+-------++ LVIDs:        4.43 cm +--------------+-------++ LV SV:        58 ml   +--------------+-------++ LV SV Index:  28.28   +--------------+-------++                       +--------------+-------++  +------------------+---------++ LV Volumes (MOD)            +------------------+---------++ LV area d, A4C:   42.40 cm +------------------+---------++ LV area s, A4C:   31.30 cm +------------------+---------++ LV major d, A4C:  9.15 cm   +------------------+---------++ LV major s, A4C:  7.86 cm   +------------------+---------++ LV vol d, MOD A4C:160.0 ml  +------------------+---------++ LV vol s, MOD A4C:105.0 ml  +------------------+---------++ LV SV MOD A4C:    160.0 ml  +------------------+---------++ +-------------+-----------++ AORTIC VALVE             +-------------+-----------++ AV Vmax:     105.50 cm/s +-------------+-----------++ AV Vmean:    70.550 cm/s +-------------+-----------++ AV VTI:      0.278 m     +-------------+-----------++ AV Peak Grad:4.5 mmHg     +-------------+-----------++ AV Mean Grad:2.5 mmHg    +-------------+-----------++ +-------------+---------++ MITRAL VALVE           +-------------+---------++ MV Peak grad:1.2 mmHg  +-------------+---------++ MV Mean grad:1.0 mmHg  +-------------+---------++ MV Vmax:     0.54 m/s  +-------------+---------++ MV Vmean:    33.8 cm/s +-------------+---------++ MV VTI:      0.22 m    +-------------+---------++  Annye Asa MD Electronically signed by Annye Asa MD Signature Date/Time: 08/13/2019/2:16:02 PM    Final    VAS US DOPPLER PRE CABG  Result Date: 08/12/2019 PREOPERATIVE VASCULAR EVALUATION  Indications:      Pre-CABG. Risk Factors:     Hypertension, coronary artery disease. Comparison Study: no prior Performing Technologist: Abram Sander RVS  Examination Guidelines: A complete evaluation includes B-mode imaging, spectral Doppler, color Doppler, and  power Doppler as needed of all accessible portions of each vessel. Bilateral testing is considered an integral part of a complete examination. Limited examinations for reoccurring indications may be performed as noted.  Right Carotid Findings: +----------+--------+--------+--------+------------+--------+           PSV cm/sEDV cm/sStenosisDescribe    Comments +----------+--------+--------+--------+------------+--------+ CCA Prox  80      12              heterogenous         +----------+--------+--------+--------+------------+--------+ CCA Distal77      14              heterogenous         +----------+--------+--------+--------+------------+--------+ ICA Prox  72      13      1-39%   heterogenous         +----------+--------+--------+--------+------------+--------+ ICA Distal80      24                                   +----------+--------+--------+--------+------------+--------+ ECA       152     8                                     +----------+--------+--------+--------+------------+--------+ Portions of this table do not appear on this page. +----------+--------+-------+--------+------------+           PSV cm/sEDV cmsDescribeArm Pressure +----------+--------+-------+--------+------------+ Subclavian141                                 +----------+--------+-------+--------+------------+ +---------+--------+--+--------+--+---------+ VertebralPSV cm/s69EDV cm/s14Antegrade +---------+--------+--+--------+--+---------+ Left Carotid Findings: +----------+--------+--------+--------+------------+--------+           PSV cm/sEDV cm/sStenosisDescribe    Comments +----------+--------+--------+--------+------------+--------+ CCA Prox  121     18              heterogenous         +----------+--------+--------+--------+------------+--------+ CCA Distal118     22              heterogenous         +----------+--------+--------+--------+------------+--------+ ICA Prox  89      21      1-39%   heterogenous         +----------+--------+--------+--------+------------+--------+ ICA Distal72      20                                   +----------+--------+--------+--------+------------+--------+ ECA       165     13                                   +----------+--------+--------+--------+------------+--------+ +----------+--------+--------+--------+------------+ SubclavianPSV cm/sEDV cm/sDescribeArm Pressure +----------+--------+--------+--------+------------+           176                     124          +----------+--------+--------+--------+------------+ +---------+--------+--+--------+--+---------+ VertebralPSV cm/s45EDV cm/s13Antegrade +---------+--------+--+--------+--+---------+  ABI Findings: +--------+------------------+-----+---------+--------+ Right   Rt Pressure (mmHg)IndexWaveform Comment  +--------+------------------+-----+---------+--------+ Brachial                        triphasic         +--------+------------------+-----+---------+--------+  ATA                            triphasic         +--------+------------------+-----+---------+--------+ PTA                            triphasic         +--------+------------------+-----+---------+--------+ +--------+------------------+-----+---------+-------+ Left    Lt Pressure (mmHg)IndexWaveform Comment +--------+------------------+-----+---------+-------+ [id number]                    triphasic        +--------+------------------+-----+---------+-------+ ATA                            triphasic        +--------+------------------+-----+---------+-------+ PTA                            triphasic        +--------+------------------+-----+---------+-------+  Right Doppler Findings: +--------+--------+-----+---------+--------+ Site    PressureIndexDoppler  Comments +--------+--------+-----+---------+--------+ Brachial             triphasic         +--------+--------+-----+---------+--------+ Radial               triphasic         +--------+--------+-----+---------+--------+ Ulnar                triphasic         +--------+--------+-----+---------+--------+  Left Doppler Findings: +--------+--------+-----+---------+--------+ Site    PressureIndexDoppler  Comments +--------+--------+-----+---------+--------+ [id number]          triphasic         +--------+--------+-----+---------+--------+ Radial               triphasic         +--------+--------+-----+---------+--------+ Ulnar                triphasic         +--------+--------+-----+---------+--------+  Summary: Right Carotid: Velocities in the right ICA are consistent with a 1-39% stenosis. Left Carotid: Velocities in the left ICA are consistent with a 1-39% stenosis. Vertebrals: Bilateral vertebral arteries demonstrate antegrade flow. Right Upper Extremity: Doppler waveform obliterate with right  radial compression. Doppler waveforms remain within normal limits with right ulnar compression. Left Upper Extremity: Doppler waveform obliterate with left radial compression. Doppler waveform obliterate with left ulnar compression.  Electronically signed by Harold Barban MD on 08/12/2019 at 8:56:12 PM.    Final          Discharge Medications: Allergies as of 08/19/2019   No Known Allergies     Medication List    STOP taking these medications   acetaminophen 650 MG CR tablet Commonly known as: TYLENOL   clopidogrel 75 MG tablet Commonly known as: PLAVIX   CoQ10 100 MG Caps   isosorbide dinitrate 30 MG tablet Commonly known as: ISORDIL     TAKE these medications   amiodarone 200 MG tablet Commonly known as: PACERONE Take 1 tablet (200 mg total) by mouth 2 (two) times daily.   apixaban 5 MG Tabs tablet Commonly known as: ELIQUIS Take 1 tablet (5 mg total) by mouth 2 (two) times daily.   aspirin EC 81 MG tablet Take 81 mg by mouth daily.   CALCIUM 600+D3 PO Take 1 tablet by mouth daily at 12 noon.  colchicine 0.6 MG tablet Take 0.5 tablets (0.3 mg total) by mouth 2 (two) times daily.   [employer] Vitamin C 500 MG tablet Generic drug: ascorbic acid Take 500 mg by mouth daily at 12 noon.   Dexilant 60 MG capsule Generic drug: dexlansoprazole Take 60 mg by mouth daily before breakfast.   Dry Eye Relief Drops 0.2-0.2-1 % Soln Generic drug: Glycerin-Hypromellose-PEG 400 Place 1 drop into both eyes 3 (three) times daily as needed (dry/irritated eyes.).   levothyroxine 50 MCG tablet Commonly known as: SYNTHROID Take 50 mcg by mouth daily before breakfast.   losartan 25 MG tablet Commonly known as: COZAAR Take 0.5 tablets (12.5 mg total) by mouth daily.   metoprolol tartrate 25 MG tablet Commonly known as: LOPRESSOR Take 0.5 tablets (12.5 mg total) by mouth 2 (two) times daily.   multivitamin with minerals Tabs tablet Take 1 tablet by mouth daily at 12 noon.     pramipexole 1.5 MG tablet Commonly known as: MIRAPEX Take 1.5 mg by mouth at bedtime.   rosuvastatin 20 MG tablet Commonly known as: CRESTOR Take 1 tablet (20 mg total) by mouth daily. What changed: when to take this   traMADol 50 MG tablet Commonly known as: ULTRAM Take 1 tablet (50 mg total) by mouth every 6 (six) hours as needed for up to 5 days for moderate pain.      The patient has been discharged on:   1.Beta Blocker:  Yes [ x  ]                              No   [   ]                              If No, reason:  2.Ace Inhibitor/ARB: Yes [x  ]                                     No  [   ]                                     If No, reason:  3.Statin:   Yes [ x  ]                  No  [   ]                  If No, reason:  4.Ecasa:  Yes  [ x  ]                  No   [   ]                  If No, reason:  Follow Up Appointments: Follow-up Information    Park Liter, MD. Go on 09/09/2019.   Specialty: Cardiology Why: Appointment time is at 3:15 pm Contact information: [address] [place] [address] [phone]        Wonda Olds, MD. Go on 08/24/2019.   Specialty: Cardiothoracic Surgery Why: Appointment time is at 1:00 pm Contact information: [place] [address] [phone]           Signed:  Maryam Feely G. RoddenberryPA-C 08/19/2019, 9:11 AM

## 2019-08-14 NOTE — Progress Notes (Signed)
Progress Note  Patient Name: Shane Massey Date of Encounter: 08/14/2019  Primary Cardiologist: Jenne Campus, MD   Subjective   The patient is doing very well this morning.  He is having only mild pain.  No shortness of breath.  Inpatient Medications    Scheduled Meds: . acetaminophen  1,000 mg Oral Q6H   Or  . acetaminophen (TYLENOL) oral liquid 160 mg/5 mL  1,000 mg Per Tube Q6H  . aspirin  81 mg Oral Daily  . bisacodyl  10 mg Oral Daily   Or  . bisacodyl  10 mg Rectal Daily  . Chlorhexidine Gluconate Cloth  6 each Topical Daily  . clopidogrel  75 mg Oral Daily  . docusate sodium  200 mg Oral Daily  . insulin aspart  0-24 Units Subcutaneous Q4H  . isosorbide dinitrate  5 mg Oral TID  . ketorolac  7.5 mg Intravenous Q6H  . ketorolac      . levothyroxine  50 mcg Oral Q0600  . metoCLOPramide (REGLAN) injection  5 mg Intravenous Q8H  . metoprolol tartrate  12.5 mg Oral BID   Or  . metoprolol tartrate  12.5 mg Per Tube BID  . [START ON 08/15/2019] pantoprazole  40 mg Oral Daily  . rosuvastatin  20 mg Oral QPM  . sodium chloride flush  3 mL Intravenous Q12H   Continuous Infusions: . sodium chloride 10 mL/hr at 08/14/19 0700  . sodium chloride    . sodium chloride    . cefUROXime (ZINACEF)  IV Stopped (08/14/19 0650)  . famotidine (PEPCID) IV Stopped (08/13/19 1435)  . lactated ringers    . lactated ringers Stopped (08/13/19 1530)  . lactated ringers 20 mL/hr at 08/14/19 0700  . niCARDipine Stopped (08/13/19 1808)  . nitroGLYCERIN 10 mcg/min (08/14/19 0700)  . phenylephrine (NEO-SYNEPHRINE) Adult infusion Stopped (08/13/19 1529)   PRN Meds: sodium chloride, lactated ringers, metoprolol tartrate, midazolam, ondansetron (ZOFRAN) IV, oxyCODONE, promethazine, sodium chloride flush, traMADol   Vital Signs    Vitals:   08/14/19 0615 08/14/19 0630 08/14/19 0645 08/14/19 0700  BP:    131/69  Pulse: 80 80 80 80  Resp: 20 10 17 15   Temp: 99.3 F (37.4 C) 99.1 F  (37.3 C) 99 F (37.2 C) 98.8 F (37.1 C)  TempSrc:      SpO2: 91% 98% 99% 98%  Weight:      Height:        Intake/Output Summary (Last 24 hours) at 08/14/2019 0837 Last data filed at 08/14/2019 0700 Gross per 24 hour  Intake 7402.26 ml  Output 3550 ml  Net 3852.26 ml   Last 3 Weights 08/14/2019 08/13/2019 08/12/2019  Weight (lbs) 190 lb 11.2 oz 183 lb 10.3 oz 183 lb 9.6 oz  Weight (kg) 86.5 kg 83.3 kg 83.28 kg      Telemetry    Atrial paced - Personally Reviewed   Physical Exam  Alert, oriented male in no distress GEN: No acute distress.     Labs    High Sensitivity Troponin:   Recent Labs  Lab 08/12/19 0920 08/12/19 1230 08/12/19 1509  TROPONINIHS 19* 17 16      Chemistry Recent Labs  Lab 08/12/19 0827 08/12/19 0920 08/13/19 0603 08/13/19 0732 08/13/19 1215 08/13/19 1335 08/13/19 1857 08/13/19 2012 08/13/19 2206 08/14/19 0419 08/14/19 0601  NA 136   < > 140   < > 141   < > 138   < > 142 138 139  K 3.7   < >  4.5   < > 4.1   < > 4.7   < > 4.6 4.9 4.7  CL 104   < > 107   < > 105  --  112*  --   --  108  --   CO2 22   < > 25  --   --   --  21*  --   --  22  --   GLUCOSE 155*   < > 113*   < > 126*  --  117*  --   --  122*  --   BUN 15   < > 15   < > 14  --  12  --   --  13  --   CREATININE 0.90   < > 0.96   < > 0.50*  --  0.68  --   --  0.74  --   CALCIUM 9.0   < > 9.4  --   --   --  7.7*  --   --  8.0*  --   PROT 6.6  --   --   --   --   --   --   --   --   --   --   ALBUMIN 3.8  --   --   --   --   --   --   --   --   --   --   AST 24  --   --   --   --   --   --   --   --   --   --   ALT 21  --   --   --   --   --   --   --   --   --   --   ALKPHOS 38  --   --   --   --   --   --   --   --   --   --   BILITOT 0.8  --   --   --   --   --   --   --   --   --   --   GFRNONAA >60   < > >60  --   --   --  >60  --   --  >60  --   GFRAA >60   < > >60  --   --   --  >60  --   --  >60  --   ANIONGAP 10   < > 8  --   --   --  5  --   --  8  --    < > = values in  this interval not displayed.     Hematology Recent Labs  Lab 08/13/19 1345 08/13/19 1345 08/13/19 1857 08/13/19 2012 08/13/19 2206 08/14/19 0419 08/14/19 0601  WBC 8.1  --  8.1  --   --  7.6  --   RBC 2.94*  --  3.23*  --   --  2.88*  --   HGB 9.2*   < > 10.1*   < > 8.8* 9.0* 9.2*  HCT 28.2*   < > 30.8*   < > 26.0* 27.8* 27.0*  MCV 95.9  --  95.4  --   --  96.5  --   MCH 31.3  --  31.3  --   --  31.3  --   MCHC 32.6  --  32.8  --   --  32.4  --   RDW 13.3  --  13.3  --   --  13.5  --   PLT 113*  --  134*  --   --  110*  --    < > = values in this interval not displayed.    BNPNo results for input(s): BNP, PROBNP in the last 168 hours.   DDimer No results for input(s): DDIMER in the last 168 hours.   Radiology    DG Chest Port 1 View  Result Date: 08/13/2019 CLINICAL DATA:  Coronary artery disease. Status post CABG. EXAM: PORTABLE CHEST 1 VIEW COMPARISON:  Chest x-ray dated 08/12/2019 FINDINGS: Endotracheal tube in good position 3.5 cm above the carina. NG tube tip below the diaphragm. Swan-Ganz catheter tip in the main pulmonary artery. 3 chest tubes in place. Tiny right apical pneumothorax. Slight atelectasis at the lung bases. No effusions. Heart size and vascularity are normal. No bone abnormality. IMPRESSION: Tiny right apical pneumothorax.  Slight bibasilar atelectasis. Electronically Signed   By: Lorriane Shire M.D.   On: 08/13/2019 13:56   ECHO INTRAOPERATIVE TEE  Result Date: 08/13/2019  *INTRAOPERATIVE TRANSESOPHAGEAL REPORT *  Patient Name:   Shane Massey Date of Exam: 08/13/2019 Medical Rec #:  [id number]      Height:       70.0 in Accession #:    [id number]     Weight:       183.6 lb Date of Birth:  1940/10/27      BSA:          2.01 m Patient Age:    79 years       BP:           135/62 mmHg Patient Gender: M              HR:           47 bpm. Exam Location:  Anesthesiology Transesophogeal exam was perform intraoperatively during surgical procedure. Patient was closely  monitored under general anesthesia during the entirety of examination. Indications:     Coronary artery disease Sonographer:     Vickie Epley RDCS Performing Phys: Annye Asa MD Diagnosing Phys: Annye Asa MD Report CC'd to:  Fredrich Romans MD Complications: No known complications during this procedure. POST-OP IMPRESSIONS - Left Ventricle: The left ventricle is unchanged from pre-bypass. Contractility remains globally hypokinetic, returning to EF approximately 35-40%. - Aortic Valve: The aortic valve appears unchanged from pre-bypass. No AI is seen. - Mitral Valve: The mitral valve appears unchanged from pre-bypass. Mild MR remains. - Tricuspid Valve: The tricuspid valve appears unchanged from pre-bypass. Mild TR remains. - Interatrial Septum: The PFO is no longer evident on color doppler interrogation. PRE-OP FINDINGS  Left Ventricle: The left ventricle has moderately reduced systolic function, with an ejection fraction of 35-40%, calculated 35-39%. The cavity size was mildly dilated. There is no significant increase in left ventricular wall thickness. Left ventricular diffuse hypokinesis. Right Ventricle: The right ventricle has normal systolic function. The cavity was normal. There is no increase in right ventricular wall thickness. Right ventricular systolic pressure is normal. Left Atrium: Left atrial size was mildly dilated. The left atrial appendage is well visualized and there is no evidence of thrombus present. Left atrial appendage velocity is slightly reduced. Right Atrium: Right atrial size was normal in size. Right atrial pressure is estimated at 10 mmHg. Interatrial Septum: Evidence of atrial level shunting detected by color flow Doppler.  A small patent foramen ovale is detected on color doppler interrogation, with predominantly left to right shunting across the atrial septum. Pericardium: There is no evidence of pericardial effusion. Mitral Valve: The mitral valve is normal in structure.  No thickening of the mitral valve leaflet. No calcification of the mitral valve leaflet. Mitral valve regurgitation is mild by color flow Doppler. The MR jet is centrally-directed. There is no evidence of mitral valve vegetation. Tricuspid Valve: The tricuspid valve was normal in structure. Tricuspid valve regurgitation is trivial by color flow Doppler. There is no evidence of tricuspid valve vegetation. Aortic Valve: The aortic valve is tricuspid. There is normal thickness of the aortic valve leaflets. Aortic valve regurgitation is trivial by color flow Doppler. The jet is centrally-directed. There is no stenosis of the aortic valve. There is no evidence of a vegetation on the aortic valve. Pulmonic Valve: The pulmonic valve was normal in structure, with normal leaflet excursion. Pulmonic valve regurgitation is trivial, around the PA catheter, by color flow Doppler. Aorta: The is normal in size and structure. There is evidence of plaque in the aortic arch; Grade I, measuring 1-95mm in size. Pulmonary Artery: Gordy Councilman catheter present on the left. The pulmonary artery is of normal size. Venous: The inferior vena cava is normal in size with greater than 50% respiratory variability, suggesting right atrial pressure of 3 mmHg. +--------------+-------++ LEFT VENTRICLE        +--------------+-------++ PLAX 2D               +--------------+-------++ LVIDd:        5.49 cm +--------------+-------++ LVIDs:        4.43 cm +--------------+-------++ LV SV:        58 ml   +--------------+-------++ LV SV Index:  28.28   +--------------+-------++                       +--------------+-------++  +------------------+---------++ LV Volumes (MOD)            +------------------+---------++ LV area d, A4C:   42.40 cm +------------------+---------++ LV area s, A4C:   31.30 cm +------------------+---------++ LV major d, A4C:  9.15 cm   +------------------+---------++ LV major s, A4C:  7.86  cm   +------------------+---------++ LV vol d, MOD A4C:160.0 ml  +------------------+---------++ LV vol s, MOD A4C:105.0 ml  +------------------+---------++ LV SV MOD A4C:    160.0 ml  +------------------+---------++ +-------------+-----------++ AORTIC VALVE             +-------------+-----------++ AV Vmax:     105.50 cm/s +-------------+-----------++ AV Vmean:    70.550 cm/s +-------------+-----------++ AV VTI:      0.278 m     +-------------+-----------++ AV Peak Grad:4.5 mmHg    +-------------+-----------++ AV Mean Grad:2.5 mmHg    +-------------+-----------++ +-------------+---------++ MITRAL VALVE           +-------------+---------++ MV Peak grad:1.2 mmHg  +-------------+---------++ MV Mean grad:1.0 mmHg  +-------------+---------++ MV Vmax:     0.54 m/s  +-------------+---------++ MV Vmean:    33.8 cm/s +-------------+---------++ MV VTI:      0.22 m    +-------------+---------++  Annye Asa MD Electronically signed by Annye Asa MD Signature Date/Time: 08/13/2019/2:16:02 PM    Final    VAS US DOPPLER PRE CABG  Result Date: 08/12/2019 PREOPERATIVE VASCULAR EVALUATION  Indications:      Pre-CABG. Risk Factors:     Hypertension, coronary artery disease. Comparison Study: no prior Performing Technologist: Abram Sander RVS  Examination Guidelines: A complete evaluation  includes B-mode imaging, spectral Doppler, color Doppler, and power Doppler as needed of all accessible portions of each vessel. Bilateral testing is considered an integral part of a complete examination. Limited examinations for reoccurring indications may be performed as noted.  Right Carotid Findings: +----------+--------+--------+--------+------------+--------+           PSV cm/sEDV cm/sStenosisDescribe    Comments +----------+--------+--------+--------+------------+--------+ CCA Prox  80      12              heterogenous          +----------+--------+--------+--------+------------+--------+ CCA Distal77      14              heterogenous         +----------+--------+--------+--------+------------+--------+ ICA Prox  72      13      1-39%   heterogenous         +----------+--------+--------+--------+------------+--------+ ICA Distal80      24                                   +----------+--------+--------+--------+------------+--------+ ECA       152     8                                    +----------+--------+--------+--------+------------+--------+ Portions of this table do not appear on this page. +----------+--------+-------+--------+------------+           PSV cm/sEDV cmsDescribeArm Pressure +----------+--------+-------+--------+------------+ Subclavian141                                 +----------+--------+-------+--------+------------+ +---------+--------+--+--------+--+---------+ VertebralPSV cm/s69EDV cm/s14Antegrade +---------+--------+--+--------+--+---------+ Left Carotid Findings: +----------+--------+--------+--------+------------+--------+           PSV cm/sEDV cm/sStenosisDescribe    Comments +----------+--------+--------+--------+------------+--------+ CCA Prox  121     18              heterogenous         +----------+--------+--------+--------+------------+--------+ CCA Distal118     22              heterogenous         +----------+--------+--------+--------+------------+--------+ ICA Prox  89      21      1-39%   heterogenous         +----------+--------+--------+--------+------------+--------+ ICA Distal72      20                                   +----------+--------+--------+--------+------------+--------+ ECA       165     13                                   +----------+--------+--------+--------+------------+--------+ +----------+--------+--------+--------+------------+ SubclavianPSV cm/sEDV cm/sDescribeArm Pressure  +----------+--------+--------+--------+------------+           176                     124          +----------+--------+--------+--------+------------+ +---------+--------+--+--------+--+---------+ VertebralPSV cm/s45EDV cm/s13Antegrade +---------+--------+--+--------+--+---------+  ABI Findings: +--------+------------------+-----+---------+--------+ Right   Rt Pressure (mmHg)IndexWaveform Comment  +--------+------------------+-----+---------+--------+ Brachial  triphasic         +--------+------------------+-----+---------+--------+ ATA                            triphasic         +--------+------------------+-----+---------+--------+ PTA                            triphasic         +--------+------------------+-----+---------+--------+ +--------+------------------+-----+---------+-------+ Left    Lt Pressure (mmHg)IndexWaveform Comment +--------+------------------+-----+---------+-------+ [id number]                    triphasic        +--------+------------------+-----+---------+-------+ ATA                            triphasic        +--------+------------------+-----+---------+-------+ PTA                            triphasic        +--------+------------------+-----+---------+-------+  Right Doppler Findings: +--------+--------+-----+---------+--------+ Site    PressureIndexDoppler  Comments +--------+--------+-----+---------+--------+ Brachial             triphasic         +--------+--------+-----+---------+--------+ Radial               triphasic         +--------+--------+-----+---------+--------+ Ulnar                triphasic         +--------+--------+-----+---------+--------+  Left Doppler Findings: +--------+--------+-----+---------+--------+ Site    PressureIndexDoppler  Comments +--------+--------+-----+---------+--------+ [id number]          triphasic          +--------+--------+-----+---------+--------+ Radial               triphasic         +--------+--------+-----+---------+--------+ Ulnar                triphasic         +--------+--------+-----+---------+--------+  Summary: Right Carotid: Velocities in the right ICA are consistent with a 1-39% stenosis. Left Carotid: Velocities in the left ICA are consistent with a 1-39% stenosis. Vertebrals: Bilateral vertebral arteries demonstrate antegrade flow. Right Upper Extremity: Doppler waveform obliterate with right radial compression. Doppler waveforms remain within normal limits with right ulnar compression. Left Upper Extremity: Doppler waveform obliterate with left radial compression. Doppler waveform obliterate with left ulnar compression.  Electronically signed by Harold Barban MD on 08/12/2019 at 8:56:12 PM.    Final      Patient Profile     79 y.o. male admitted with near syncope and known multivessel coronary disease awaiting CABG, underwent multivessel CABG with arterial conduit 08/13/2019  Assessment & Plan    1.  CAD status post CABG: Progressing very well day #1 post CABG 2.  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  Continue to follow.  Telemetry reviewed shows atrial pacing.  Patient is hemodynamically stable on no vasoactive drugs at present.     For questions or updates, please contact [place] Please consult [email or web address] for contact info under        Signed, Sherren Mocha, MD  08/14/2019, 8:37 AM

## 2019-08-15 ENCOUNTER — Inpatient Hospital Stay (HOSPITAL_COMMUNITY): Payer: PPO

## 2019-08-15 DIAGNOSIS — I25118 Atherosclerotic heart disease of native coronary artery with other forms of angina pectoris: Secondary | ICD-10-CM

## 2019-08-15 DIAGNOSIS — I9789 Other postprocedural complications and disorders of the circulatory system, not elsewhere classified: Secondary | ICD-10-CM

## 2019-08-15 DIAGNOSIS — I4891 Unspecified atrial fibrillation: Secondary | ICD-10-CM

## 2019-08-15 LAB — CBC
HCT: 28.5 % — ABNORMAL LOW (ref 39.0–52.0)
Hemoglobin: 9.1 g/dL — ABNORMAL LOW (ref 13.0–17.0)
MCH: 31.4 pg (ref 26.0–34.0)
MCHC: 31.9 g/dL (ref 30.0–36.0)
MCV: 98.3 fL (ref 80.0–100.0)
Platelets: 110 10*3/uL — ABNORMAL LOW (ref 150–400)
RBC: 2.9 MIL/uL — ABNORMAL LOW (ref 4.22–5.81)
RDW: 13.6 % (ref 11.5–15.5)
WBC: 8.5 10*3/uL (ref 4.0–10.5)
nRBC: 0 % (ref 0.0–0.2)

## 2019-08-15 LAB — BASIC METABOLIC PANEL
Anion gap: 9 (ref 5–15)
BUN: 13 mg/dL (ref 8–23)
CO2: 23 mmol/L (ref 22–32)
Calcium: 8.2 mg/dL — ABNORMAL LOW (ref 8.9–10.3)
Chloride: 104 mmol/L (ref 98–111)
Creatinine, Ser: 0.73 mg/dL (ref 0.61–1.24)
GFR calc Af Amer: >60 mL/min (ref >60)
GFR calc non Af Amer: >60 mL/min (ref >60)
Glucose, Bld: 102 mg/dL — ABNORMAL HIGH (ref 70–99)
Potassium: 4.5 mmol/L (ref 3.5–5.1)
Sodium: 136 mmol/L (ref 135–145)

## 2019-08-15 LAB — GLUCOSE, CAPILLARY
Glucose-Capillary: 100 mg/dL — ABNORMAL HIGH (ref 70–99)
Glucose-Capillary: 118 mg/dL — ABNORMAL HIGH (ref 70–99)
Glucose-Capillary: 121 mg/dL — ABNORMAL HIGH (ref 70–99)
Glucose-Capillary: 133 mg/dL — ABNORMAL HIGH (ref 70–99)
Glucose-Capillary: 135 mg/dL — ABNORMAL HIGH (ref 70–99)
Glucose-Capillary: 156 mg/dL — ABNORMAL HIGH (ref 70–99)

## 2019-08-15 MED ORDER — AMIODARONE HCL 200 MG PO TABS
100.0000 mg | ORAL_TABLET | Freq: Two times a day (BID) | ORAL | Status: DC
  Administered 2019-08-15 – 2019-08-16 (×4): 100 mg via ORAL
  Filled 2019-08-15 (×4): qty 1

## 2019-08-15 MED ORDER — FUROSEMIDE 10 MG/ML IJ SOLN
20.0000 mg | Freq: Two times a day (BID) | INTRAMUSCULAR | Status: DC
  Administered 2019-08-15 – 2019-08-16 (×3): 20 mg via INTRAVENOUS
  Filled 2019-08-15 (×3): qty 2

## 2019-08-15 MED ORDER — AMIODARONE HCL IN DEXTROSE 360-4.14 MG/200ML-% IV SOLN
30.0000 mg/h | INTRAVENOUS | Status: DC
  Filled 2019-08-15: qty 200

## 2019-08-15 MED ORDER — AMIODARONE LOAD VIA INFUSION
150.0000 mg | Freq: Once | INTRAVENOUS | Status: AC
  Administered 2019-08-15: 150 mg via INTRAVENOUS
  Filled 2019-08-15: qty 83.34

## 2019-08-15 MED ORDER — AMIODARONE HCL IN DEXTROSE 360-4.14 MG/200ML-% IV SOLN
60.0000 mg/h | INTRAVENOUS | Status: DC
  Administered 2019-08-15 (×2): 60 mg/h via INTRAVENOUS
  Filled 2019-08-15: qty 200

## 2019-08-15 MED ORDER — INSULIN ASPART 100 UNIT/ML ~~LOC~~ SOLN
0.0000 [IU] | Freq: Three times a day (TID) | SUBCUTANEOUS | Status: DC
  Administered 2019-08-15 – 2019-08-18 (×6): 2 [IU] via SUBCUTANEOUS

## 2019-08-15 NOTE — Progress Notes (Signed)
2 Days Post-Op Procedure(s) (LRB): CORONARY ARTERY BYPASS GRAFTING (CABG) TIMES 3. (N/A) TRANSESOPHAGEAL ECHOCARDIOGRAM (TEE) (N/A) OPEN RADIAL ARTERY HARVEST (Left) CLOSURE OF PATENT FORAMEN OVALE (N/A) Subjective: afib last nite 90/min- now nsr on amiodarone Min chest tube output - will DC Objective: Vital signs in last 24 hours: Temp:  [98.3 F (36.8 C)-99.1 F (37.3 C)] 99.1 F (37.3 C) (03/06 0759) Pulse Rate:  [63-105] 72 (03/06 1000) Cardiac Rhythm: Atrial fibrillation (03/06 0800) Resp:  [11-27] 21 (03/06 1000) BP: (109-156)/(55-104) 137/68 (03/06 1000) SpO2:  [92 %-100 %] 100 % (03/06 1000) Arterial Line BP: (141-184)/(37-65) 141/38 (03/05 1330) Weight:  [85.5 kg] 85.5 kg (03/06 0500)  Hemodynamic parameters for last 24 hours:    Intake/Output from previous day: 03/05 0701 - 03/06 0700 In: 207.7 [I.V.:207.7] Out: 3780 [Urine:3150; Chest Tube:630] Intake/Output this shift: Total I/O In: 243 [P.O.:240; I.V.:3] Out: 185 [Urine:175; Chest Tube:10]       Exam    General- alert and comfortable    Neck- no JVD, no cervical adenopathy palpable, no carotid bruit   Lungs- clear without rales, wheezes   Cor- regular rate and rhythm, no murmur , gallop   Abdomen- soft, non-tender   Extremities - warm, non-tender, minimal edema   Neuro- oriented, appropriate, no focal weakness   Lab Results: Recent Labs    08/14/19 1608 08/15/19 0501  WBC 6.6 8.5  HGB 8.9* 9.1*  HCT 27.7* 28.5*  PLT 98* 110*   BMET:  Recent Labs    08/14/19 1608 08/15/19 0501  NA 136 136  K 4.5 4.5  CL 107 104  CO2 23 23  GLUCOSE 105* 102*  BUN 13 13  CREATININE 0.77 0.73  CALCIUM 8.1* 8.2*    PT/INR:  Recent Labs    08/13/19 1345  LABPROT 16.7*  INR 1.4*   ABG    Component Value Date/Time   PHART 7.323 (L) 08/14/2019 0601   HCO3 21.5 08/14/2019 0601   TCO2 23 08/14/2019 0601   ACIDBASEDEF 4.0 (H) 08/14/2019 0601   O2SAT 98.0 08/14/2019 0601   CBG (last 3)  Recent  Labs    08/14/19 2328 08/15/19 0313 08/15/19 0838  GLUCAP 107* 100* 156*    Assessment/Plan: S/P Procedure(s) (LRB): CORONARY ARTERY BYPASS GRAFTING (CABG) TIMES 3. (N/A) TRANSESOPHAGEAL ECHOCARDIOGRAM (TEE) (N/A) OPEN RADIAL ARTERY HARVEST (Left) CLOSURE OF PATENT FORAMEN OVALE (N/A) Mobilize Diuresis d/c pacing wires low dose po amiodarone   LOS: 3 days    Shane Massey 08/15/2019

## 2019-08-15 NOTE — Progress Notes (Signed)
Progress Note  Patient Name: BRION PRUET Date of Encounter: 08/15/2019  Primary Cardiologist: Jenne Campus, MD   Subjective   Feels well, very little pain, no breathing difficulty. In atrial fibrillation with controlled rate since about 0600h. Excellent UO (>3 l/24 h)  Inpatient Medications    Scheduled Meds: . acetaminophen  1,000 mg Oral Q6H   Or  . acetaminophen (TYLENOL) oral liquid 160 mg/5 mL  1,000 mg Per Tube Q6H  . aspirin  81 mg Oral Daily  . bisacodyl  10 mg Oral Daily   Or  . bisacodyl  10 mg Rectal Daily  . Chlorhexidine Gluconate Cloth  6 each Topical Daily  . clopidogrel  75 mg Oral Daily  . colchicine  0.3 mg Oral BID  . docusate sodium  200 mg Oral Daily  . insulin aspart  0-24 Units Subcutaneous Q4H  . isosorbide dinitrate  5 mg Oral TID  . levothyroxine  50 mcg Oral Q0600  . mouth rinse  15 mL Mouth Rinse BID  . pantoprazole  40 mg Oral Daily  . rosuvastatin  20 mg Oral QPM  . sodium chloride flush  3 mL Intravenous Q12H   Continuous Infusions: . sodium chloride Stopped (08/14/19 0834)  . sodium chloride    . sodium chloride    . amiodarone 60 mg/hr (08/15/19 0647)   Followed by  . amiodarone    . lactated ringers    . lactated ringers Stopped (08/13/19 1530)  . lactated ringers 20 mL/hr at 08/14/19 1600   PRN Meds: sodium chloride, lactated ringers, ondansetron (ZOFRAN) IV, oxyCODONE, promethazine, sodium chloride flush, traMADol   Vital Signs    Vitals:   08/15/19 0640 08/15/19 0700 08/15/19 0759 08/15/19 0800  BP: 139/66 123/63  (!) 124/59  Pulse: 99 63  93  Resp: 14 (!) 21  (!) 27  Temp:   99.1 F (37.3 C)   TempSrc:   Oral   SpO2: 97% 100%  100%  Weight:      Height:        Intake/Output Summary (Last 24 hours) at 08/15/2019 0905 Last data filed at 08/15/2019 0800 Gross per 24 hour  Intake 146.53 ml  Output 3765 ml  Net -3618.47 ml   Last 3 Weights 08/15/2019 08/14/2019 08/13/2019  Weight (lbs) 188 lb 7.9 oz 190 lb 11.2 oz  183 lb 10.3 oz  Weight (kg) 85.5 kg 86.5 kg 83.3 kg      Telemetry    AF with rate 70-90 - Personally Reviewed  ECG    (yesterday) severe sinus brady and junctional escape rhythm, TWI V3-V6 - Personally Reviewed  Physical Exam  Appears well GEN: No acute distress.   Neck: No JVD Cardiac: irregular, no murmurs, rubs, or gallops.  Respiratory: Clear to auscultation bilaterally. GI: Soft, nontender, non-distended  MS: No edema; No deformity. Neuro:  Nonfocal  Psych: Normal affect   Labs    High Sensitivity Troponin:   Recent Labs  Lab 08/12/19 0920 08/12/19 1230 08/12/19 1509  TROPONINIHS 19* 17 16      Chemistry Recent Labs  Lab 08/12/19 0827 08/12/19 0920 08/14/19 0419 08/14/19 0419 08/14/19 0601 08/14/19 1608 08/15/19 0501  NA 136   < > 138   < > 139 136 136  K 3.7   < > 4.9   < > 4.7 4.5 4.5  CL 104   < > 108  --   --  107 104  CO2 22   < >  22  --   --  23 23  GLUCOSE 155*   < > 122*  --   --  105* 102*  BUN 15   < > 13  --   --  13 13  CREATININE 0.90   < > 0.74  --   --  0.77 0.73  CALCIUM 9.0   < > 8.0*  --   --  8.1* 8.2*  PROT 6.6  --   --   --   --   --   --   ALBUMIN 3.8  --   --   --   --   --   --   AST 24  --   --   --   --   --   --   ALT 21  --   --   --   --   --   --   ALKPHOS 38  --   --   --   --   --   --   BILITOT 0.8  --   --   --   --   --   --   GFRNONAA >60   < > >60  --   --  >60 >60  GFRAA >60   < > >60  --   --  >60 >60  ANIONGAP 10   < > 8  --   --  6 9   < > = values in this interval not displayed.     Hematology Recent Labs  Lab 08/14/19 0419 08/14/19 0419 08/14/19 0601 08/14/19 1608 08/15/19 0501  WBC 7.6  --   --  6.6 8.5  RBC 2.88*  --   --  2.85* 2.90*  HGB 9.0*   < > 9.2* 8.9* 9.1*  HCT 27.8*   < > 27.0* 27.7* 28.5*  MCV 96.5  --   --  97.2 98.3  MCH 31.3  --   --  31.2 31.4  MCHC 32.4  --   --  32.1 31.9  RDW 13.5  --   --  13.6 13.6  PLT 110*  --   --  98* 110*   < > = values in this interval not  displayed.    BNPNo results for input(s): BNP, PROBNP in the last 168 hours.   DDimer No results for input(s): DDIMER in the last 168 hours.   Radiology    DG Chest Port 1 View  Result Date: 08/15/2019 CLINICAL DATA:  Postop day 2 CABG. EXAM: PORTABLE CHEST 1 VIEW COMPARISON:  08/14/2019 and earlier. FINDINGS: Interval extubation, Swan-Ganz catheter removal, and nasogastric tube removal. RIGHT jugular introducer sheath tip projects over the upper SVC. LEFT chest tube in place with no convincing residual LEFT pneumothorax. BILATERAL pleural effusions and associated atelectasis in the lung bases, unchanged. Linear atelectasis in the LEFT mid lung, unchanged. No new pulmonary parenchymal abnormalities. IMPRESSION: 1. LEFT chest tube in place with no convincing residual LEFT pneumothorax. 2. Stable BILATERAL pleural effusions and associated atelectasis in the lung bases. Stable linear atelectasis in the LEFT mid lung. 3. No new abnormalities. Electronically Signed   By: Evangeline Dakin M.D.   On: 08/15/2019 09:00   DG Chest Port 1 View  Result Date: 08/14/2019 CLINICAL DATA:  Chest tube, post CABG EXAM: PORTABLE CHEST 1 VIEW COMPARISON:  Chest radiograph from one day prior. FINDINGS: Right internal jugular Swan-Ganz catheter is stable with tip over the main pulmonary artery. Stable bilateral chest  tubes and mediastinal drain. Interval extubation and removal of enteric tube. Stable cardiomediastinal silhouette with normal heart size. No appreciable residual right pneumothorax. Probable tiny less than 5% left apical pneumothorax, not definitely seen on prior. Stable trace bilateral pleural effusions. No pulmonary edema. Mild bibasilar atelectasis appears decreased on the right and stable on the left. IMPRESSION: 1. Probable tiny left apical pneumothorax, not definitely seen on prior. No appreciable residual right pneumothorax. Stable bilateral chest tube positions. 2. Stable trace bilateral pleural  effusions. 3. Mild bibasilar atelectasis, decreased on the right and stable on the left. Electronically Signed   By: Ilona Sorrel M.D.   On: 08/14/2019 09:01   DG Chest Port 1 View  Result Date: 08/13/2019 CLINICAL DATA:  Coronary artery disease. Status post CABG. EXAM: PORTABLE CHEST 1 VIEW COMPARISON:  Chest x-ray dated 08/12/2019 FINDINGS: Endotracheal tube in good position 3.5 cm above the carina. NG tube tip below the diaphragm. Swan-Ganz catheter tip in the main pulmonary artery. 3 chest tubes in place. Tiny right apical pneumothorax. Slight atelectasis at the lung bases. No effusions. Heart size and vascularity are normal. No bone abnormality. IMPRESSION: Tiny right apical pneumothorax.  Slight bibasilar atelectasis. Electronically Signed   By: Lorriane Shire M.D.   On: 08/13/2019 13:56    Cardiac Studies   Cath 08/06/2019  Ost RCA lesion is 20% stenosed.  Prox RCA lesion is 20% stenosed.  Dist RCA lesion is 30% stenosed.  Mid LM to Dist LM lesion is 75% stenosed.  Ramus lesion is 80% stenosed.  1st Diag lesion is 50% stenosed.  Mid LAD-2 lesion is 5% stenosed.  Mid LAD-1 lesion is 20% stenosed.  Dist LAD lesion is 30% stenosed.  The left ventricular systolic function is normal.  LV end diastolic pressure is normal.   Severe 75% focal distal left main stenosis immediately proximal to its trifurcation into a moderate size LAD with 50% stenosis in the first diagonal vessel, 20% stenosis prior to the mid LAD stent which is patent with 30% stenosis after the stented segment; 80% stenosis in the superior branch of the ramus intermediate vessel and normal-appearing left circumflex coronary artery. RCA is a dominant vessel which has a smooth ostial tapering of 20% followed by 20% proximal and 30% distal stenoses.  LV function segment at 55%.  LVEDP 12 mm.  RECOMMENDATION: Recommend CABG revascularization surgery.  The patient has been on Plavix.  He was instructed to hold his  Plavix as of today.  He will be discharged later today on medical therapy and is scheduled to see Dr. Rodena Goldmann on Monday with plans for CABG revascularization next week.  Diagnostic Dominance: Right    Patient Profile     79 y.o. male day #2 s/p all-arterial conduit CABG for severe left coronary stenosis. History of near-syncope, OSA, HTN, HLP, mildly reduced LVEF, NSVT.  Assessment & Plan    1. CAD s/p CABG (LIMA-LAD, radial-OM, pedicled RIMA- ramus): preop EF 50%, making good progress. 2. Postop AFib: on iv amio, rates 70-90; prior to AFib had severe sinus bradycardia. TPW still in. 3. 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

## 2019-08-15 NOTE — Progress Notes (Signed)
CT surgery p.m. Rounds  Patient now in sinus rhythm 80 bpm with stable blood pressure Had stable day Continue current care

## 2019-08-15 NOTE — Progress Notes (Signed)
Assessed underlying rhythm, sinus brady 48-50, set external pacemaker to continue atrial pacing. Upon doing so, patient's rhythm converted to atrial fib 95-110. External pacer set to VVI, Dr. Prescott Gum paged. Received call back, verbal order to initiate Amio protocol (order placed in Epic).

## 2019-08-15 NOTE — Plan of Care (Signed)
  Problem: Education: Goal: Knowledge of General Education information will improve Description: Including pain rating scale, medication(s)/side effects and non-pharmacologic comfort measures Outcome: Progressing   Problem: Education: Goal: Knowledge of General Education information will improve Description: Including pain rating scale, medication(s)/side effects and non-pharmacologic comfort measures Outcome: Progressing   Problem: Health Behavior/Discharge Planning: Goal: Ability to manage health-related needs will improve Outcome: Progressing   Problem: Clinical Measurements: Goal: Ability to maintain clinical measurements within normal limits will improve Outcome: Progressing Goal: Will remain free from infection Outcome: Progressing Goal: Diagnostic test results will improve Outcome: Progressing Goal: Respiratory complications will improve Outcome: Progressing Goal: Cardiovascular complication will be avoided Outcome: Progressing   Problem: Activity: Goal: Risk for activity intolerance will decrease Outcome: Progressing   Problem: Nutrition: Goal: Adequate nutrition will be maintained Outcome: Progressing   Problem: Coping: Goal: Level of anxiety will decrease Outcome: Progressing   Problem: Elimination: Goal: Will not experience complications related to bowel motility Outcome: Progressing Goal: Will not experience complications related to urinary retention Outcome: Progressing   Problem: Pain Managment: Goal: General experience of comfort will improve Outcome: Progressing   Problem: Safety: Goal: Ability to remain free from injury will improve Outcome: Progressing   Problem: Skin Integrity: Goal: Risk for impaired skin integrity will decrease Outcome: Progressing   Problem: Education: Goal: Will demonstrate proper wound care and an understanding of methods to prevent future damage Outcome: Progressing Goal: Knowledge of disease or condition will  improve Outcome: Progressing Goal: Knowledge of the prescribed therapeutic regimen will improve Outcome: Progressing Goal: Individualized Educational Video(s) Outcome: Progressing   Problem: Activity: Goal: Risk for activity intolerance will decrease Outcome: Progressing   Problem: Cardiac: Goal: Will achieve and/or maintain hemodynamic stability Outcome: Progressing   Problem: Clinical Measurements: Goal: Postoperative complications will be avoided or minimized Outcome: Progressing   Problem: Respiratory: Goal: Respiratory status will improve Outcome: Progressing   Problem: Skin Integrity: Goal: Wound healing without signs and symptoms of infection Outcome: Progressing Goal: Risk for impaired skin integrity will decrease Outcome: Progressing   Problem: Urinary Elimination: Goal: Ability to achieve and maintain adequate renal perfusion and functioning will improve Outcome: Progressing   

## 2019-08-16 ENCOUNTER — Other Ambulatory Visit: Payer: Self-pay

## 2019-08-16 ENCOUNTER — Inpatient Hospital Stay (HOSPITAL_COMMUNITY): Payer: PPO

## 2019-08-16 LAB — GLUCOSE, CAPILLARY
Glucose-Capillary: 141 mg/dL — ABNORMAL HIGH (ref 70–99)
Glucose-Capillary: 149 mg/dL — ABNORMAL HIGH (ref 70–99)
Glucose-Capillary: 120 mg/dL — ABNORMAL HIGH (ref 70–99)
Glucose-Capillary: 113 mg/dL — ABNORMAL HIGH (ref 70–99)
Glucose-Capillary: 116 mg/dL — ABNORMAL HIGH (ref 70–99)

## 2019-08-16 LAB — BASIC METABOLIC PANEL
Anion gap: 8 (ref 5–15)
BUN: 13 mg/dL (ref 8–23)
CO2: 24 mmol/L (ref 22–32)
Calcium: 8.3 mg/dL — ABNORMAL LOW (ref 8.9–10.3)
Chloride: 105 mmol/L (ref 98–111)
Creatinine, Ser: 0.83 mg/dL (ref 0.61–1.24)
GFR calc Af Amer: >60 mL/min (ref >60)
GFR calc non Af Amer: >60 mL/min (ref >60)
Glucose, Bld: 140 mg/dL — ABNORMAL HIGH (ref 70–99)
Potassium: 4.4 mmol/L (ref 3.5–5.1)
Sodium: 137 mmol/L (ref 135–145)

## 2019-08-16 LAB — CBC
HCT: 27.6 % — ABNORMAL LOW (ref 39.0–52.0)
Hemoglobin: 9 g/dL — ABNORMAL LOW (ref 13.0–17.0)
MCH: 31.4 pg (ref 26.0–34.0)
MCHC: 32.6 g/dL (ref 30.0–36.0)
MCV: 96.2 fL (ref 80.0–100.0)
Platelets: 155 10*3/uL (ref 150–400)
RBC: 2.87 MIL/uL — ABNORMAL LOW (ref 4.22–5.81)
RDW: 13.5 % (ref 11.5–15.5)
WBC: 10.1 10*3/uL (ref 4.0–10.5)
nRBC: 0 % (ref 0.0–0.2)

## 2019-08-16 MED ORDER — AMIODARONE HCL IN DEXTROSE 360-4.14 MG/200ML-% IV SOLN
30.0000 mg/h | INTRAVENOUS | Status: DC
  Administered 2019-08-17: 30 mg/h via INTRAVENOUS
  Filled 2019-08-16: qty 200

## 2019-08-16 MED ORDER — SODIUM CHLORIDE 0.9% FLUSH
3.0000 mL | Freq: Two times a day (BID) | INTRAVENOUS | Status: DC
  Administered 2019-08-16 – 2019-08-19 (×5): 3 mL via INTRAVENOUS

## 2019-08-16 MED ORDER — SODIUM CHLORIDE 0.9 % IV SOLN: 250.0000 mL | INTRAVENOUS | Status: DC | PRN

## 2019-08-16 MED ORDER — AMIODARONE LOAD VIA INFUSION
150.0000 mg | Freq: Once | INTRAVENOUS | Status: AC
  Administered 2019-08-16: 150 mg via INTRAVENOUS
  Filled 2019-08-16: qty 83.34

## 2019-08-16 MED ORDER — AMIODARONE HCL IN DEXTROSE 360-4.14 MG/200ML-% IV SOLN
60.0000 mg/h | INTRAVENOUS | Status: DC
  Administered 2019-08-16 – 2019-08-17 (×2): 60 mg/h via INTRAVENOUS
  Filled 2019-08-16 (×2): qty 200

## 2019-08-16 MED ORDER — ~~LOC~~ CARDIAC SURGERY, PATIENT & FAMILY EDUCATION: Freq: Once | Status: AC

## 2019-08-16 MED ORDER — ENOXAPARIN SODIUM 40 MG/0.4ML ~~LOC~~ SOLN
40.0000 mg | SUBCUTANEOUS | Status: DC
  Administered 2019-08-16 – 2019-08-17 (×2): 40 mg via SUBCUTANEOUS
  Filled 2019-08-16 (×2): qty 0.4

## 2019-08-16 MED ORDER — SODIUM CHLORIDE 0.9% FLUSH: 3.0000 mL | INTRAVENOUS | Status: DC | PRN

## 2019-08-16 NOTE — Progress Notes (Signed)
Patient rhythm changed to atrial fibrillation rate 110-120's 12 lead Ekg obtained to confirm.Patient denies chest pain, shortness of breath, or palpitations.Spoke with Dr.Lightfoot and new orders received to start amiodarone drip with loading dose.

## 2019-08-16 NOTE — Progress Notes (Signed)
08/16/2019 1400 Received pt to room 4E-12 from [place].  Pt is A&O, no C/O voiced.  Tele monitor applied and CCMD notified.  CHG bath given.  Oriented to room, call light and bed.  Call bell in reach.  Family at bedside. [place]

## 2019-08-16 NOTE — Progress Notes (Signed)
Progress Note  Patient Name: Shane Massey Date of Encounter: 08/16/2019  Primary Cardiologist: Jenne Campus, MD   Subjective   Feels well. AFib resolved and he is no longer bradycardic either. Walked around the unit without difficulty.  Inpatient Medications    Scheduled Meds: . acetaminophen  1,000 mg Oral Q6H   Or  . acetaminophen (TYLENOL) oral liquid 160 mg/5 mL  1,000 mg Per Tube Q6H  . amiodarone  100 mg Oral BID  . aspirin  81 mg Oral Daily  . bisacodyl  10 mg Oral Daily   Or  . bisacodyl  10 mg Rectal Daily  . Chlorhexidine Gluconate Cloth  6 each Topical Daily  . clopidogrel  75 mg Oral Daily  . colchicine  0.3 mg Oral BID  . docusate sodium  200 mg Oral Daily  . furosemide  20 mg Intravenous BID  . insulin aspart  0-24 Units Subcutaneous TID WC  . isosorbide dinitrate  5 mg Oral TID  . levothyroxine  50 mcg Oral Q0600  . mouth rinse  15 mL Mouth Rinse BID  . pantoprazole  40 mg Oral Daily  . rosuvastatin  20 mg Oral QPM  . sodium chloride flush  3 mL Intravenous Q12H   Continuous Infusions: . sodium chloride Stopped (08/14/19 0834)  . sodium chloride    . sodium chloride    . lactated ringers Stopped (08/13/19 1530)  . lactated ringers Stopped (08/14/19 1620)   PRN Meds: sodium chloride, ondansetron (ZOFRAN) IV, oxyCODONE, sodium chloride flush, traMADol   Vital Signs    Vitals:   08/16/19 0400 08/16/19 0500 08/16/19 0600 08/16/19 0835  BP: 136/64 130/64 (!) 124/58   Pulse: 68 78 66   Resp: 16 (!) 25 18   Temp: 98.8 F (37.1 C)   98.3 F (36.8 C)  TempSrc: Oral   Oral  SpO2: 94% 94% 92%   Weight:  85.6 kg    Height:        Intake/Output Summary (Last 24 hours) at 08/16/2019 0949 Last data filed at 08/16/2019 0200 Gross per 24 hour  Intake 189.03 ml  Output 1 ml  Net 188.03 ml   Last 3 Weights 08/16/2019 08/15/2019 08/14/2019  Weight (lbs) 188 lb 11.4 oz 188 lb 7.9 oz 190 lb 11.2 oz  Weight (kg) 85.6 kg 85.5 kg 86.5 kg      Telemetry      NSR, PACs - Personally Reviewed  ECG    A paced V sensed - Personally Reviewed  Physical Exam  Appears comfortable GEN: No acute distress.   Neck: No JVD Cardiac: RRR, no murmurs, rubs, or gallops. Sternotomy healing well  Respiratory: Clear to auscultation bilaterally. GI: Soft, nontender, non-distended  MS: No edema; No deformity. Neuro:  Nonfocal  Psych: Normal affect   Labs    High Sensitivity Troponin:   Recent Labs  Lab 08/12/19 0920 08/12/19 1230 08/12/19 1509  TROPONINIHS 19* 17 16      Chemistry Recent Labs  Lab 08/12/19 0827 08/12/19 0920 08/14/19 1608 08/15/19 0501 08/16/19 0500  NA 136   < > 136 136 137  K 3.7   < > 4.5 4.5 4.4  CL 104   < > 107 104 105  CO2 22   < > 23 23 24   GLUCOSE 155*   < > 105* 102* 140*  BUN 15   < > 13 13 13   CREATININE 0.90   < > 0.77 0.73 0.83  CALCIUM 9.0   < >  8.1* 8.2* 8.3*  PROT 6.6  --   --   --   --   ALBUMIN 3.8  --   --   --   --   AST 24  --   --   --   --   ALT 21  --   --   --   --   ALKPHOS 38  --   --   --   --   BILITOT 0.8  --   --   --   --   GFRNONAA >60   < > >60 >60 >60  GFRAA >60   < > >60 >60 >60  ANIONGAP 10   < > 6 9 8    < > = values in this interval not displayed.     Hematology Recent Labs  Lab 08/14/19 1608 08/15/19 0501 08/16/19 0500  WBC 6.6 8.5 10.1  RBC 2.85* 2.90* 2.87*  HGB 8.9* 9.1* 9.0*  HCT 27.7* 28.5* 27.6*  MCV 97.2 98.3 96.2  MCH 31.2 31.4 31.4  MCHC 32.1 31.9 32.6  RDW 13.6 13.6 13.5  PLT 98* 110* 155    BNPNo results for input(s): BNP, PROBNP in the last 168 hours.   DDimer No results for input(s): DDIMER in the last 168 hours.   Radiology    DG Chest Port 1 View  Result Date: 08/16/2019 CLINICAL DATA:  CABG EXAM: PORTABLE CHEST 1 VIEW COMPARISON:  08/15/2019 FINDINGS: Right IJ sheath remains present. Interval chest tube removal. Bilateral pleural effusions and bibasilar atelectasis. No pneumothorax. Stable cardiomediastinal contours. IMPRESSION: Similar  bilateral pleural effusions and bibasilar atelectasis. No pneumothorax. Electronically Signed   By: Macy Mis M.D.   On: 08/16/2019 06:16   DG Chest Port 1 View  Result Date: 08/15/2019 CLINICAL DATA:  Postop day 2 CABG. EXAM: PORTABLE CHEST 1 VIEW COMPARISON:  08/14/2019 and earlier. FINDINGS: Interval extubation, Swan-Ganz catheter removal, and nasogastric tube removal. RIGHT jugular introducer sheath tip projects over the upper SVC. LEFT chest tube in place with no convincing residual LEFT pneumothorax. BILATERAL pleural effusions and associated atelectasis in the lung bases, unchanged. Linear atelectasis in the LEFT mid lung, unchanged. No new pulmonary parenchymal abnormalities. IMPRESSION: 1. LEFT chest tube in place with no convincing residual LEFT pneumothorax. 2. Stable BILATERAL pleural effusions and associated atelectasis in the lung bases. Stable linear atelectasis in the LEFT mid lung. 3. No new abnormalities. Electronically Signed   By: Evangeline Dakin M.D.   On: 08/15/2019 09:00    Cardiac Studies   Cath 08/06/2019  Ost RCA lesion is 20% stenosed.  Prox RCA lesion is 20% stenosed.  Dist RCA lesion is 30% stenosed.  Mid LM to Dist LM lesion is 75% stenosed.  Ramus lesion is 80% stenosed.  1st Diag lesion is 50% stenosed.  Mid LAD-2 lesion is 5% stenosed.  Mid LAD-1 lesion is 20% stenosed.  Dist LAD lesion is 30% stenosed.  The left ventricular systolic function is normal.  LV end diastolic pressure is normal.  Severe 75% focal distal left main stenosis immediately proximal to its trifurcation into a moderate size LAD with 50% stenosis in the first diagonal vessel, 20% stenosis prior to the mid LAD stent which is patent with 30% stenosis after the stented segment; 80% stenosis in the superior branch of the ramus intermediate vessel and normal-appearing left circumflex coronary artery. RCA is a dominant vessel which has a smooth ostial tapering of 20% followed by  20% proximal and 30% distal stenoses.  LV function segment at 55%. LVEDP 12 mm.  RECOMMENDATION: Recommend CABG revascularization surgery. The patient has been on Plavix. He was instructed to hold his Plavix as of today. He will be discharged later today on medical therapy and is scheduled to see Dr. Rodena Goldmann on Monday with plans for CABG revascularization next week.  Diagnostic Dominance: Right    Patient Profile     79 y.o. male day #3 s/p all-arterial conduit CABG for severe left coronary stenosis. History of near-syncope, OSA, HTN, HLP, mildly reduced LVEF, NSVT.   Assessment & Plan    1. CAD s/p CABG (LIMA-LAD, radial-OM, pedicled RIMA- ramus): preop EF 50%, making good progress. 2. Postop AFib: resolved. Plan low dose PO amio x 30 days. No indication for anticoagulation unless the arrhythmia recurs. 3. 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     For questions or updates, please contact [place] Please consult [email or web address] for contact info under        Signed, Sanda Klein, MD  08/16/2019, 9:49 AM

## 2019-08-16 NOTE — Progress Notes (Signed)
      [address]       [place] [address]             [phone]                 3 Days Post-Op Procedure(s) (LRB): CORONARY ARTERY BYPASS GRAFTING (CABG) TIMES 3. (N/A) TRANSESOPHAGEAL ECHOCARDIOGRAM (TEE) (N/A) OPEN RADIAL ARTERY HARVEST (Left) CLOSURE OF PATENT FORAMEN OVALE (N/A)   Events: No events.  Up to chair brushing teeth _______________________________________________________________ Vitals: BP 136/64   Pulse 70   Temp 98.3 F (36.8 C) (Oral)   Resp (!) 23   Ht 5\' 10"  (1.778 m)   Wt 85.6 kg   SpO2 98%   BMI 27.08 kg/m   - Neuro: alert NAD  - Cardiovascular: sinus  Drips: none.      - Pulm: clear    ABG    Component Value Date/Time   PHART 7.323 (L) 08/14/2019 0601   PCO2ART 41.6 08/14/2019 0601   PO2ART 118.0 (H) 08/14/2019 0601   HCO3 21.5 08/14/2019 0601   TCO2 23 08/14/2019 0601   ACIDBASEDEF 4.0 (H) 08/14/2019 0601   O2SAT 98.0 08/14/2019 0601    - Abd: soft - Extremity: trace  .Intake/Output      03/06 0701 - 03/07 0700 03/07 0701 - 03/08 0700   P.O. 420 480   I.V. (mL/kg) 9 (0.1)    Total Intake(mL/kg) 429 (5) 480 (5.6)   Urine (mL/kg/hr) 176 (0.1)    Chest Tube 10    Total Output 186    Net +243 +480        Urine Occurrence 6 x 1 x      _______________________________________________________________ Labs: CBC Latest Ref Rng & Units 08/16/2019 08/15/2019 08/14/2019  WBC 4.0 - 10.5 K/uL 10.1 8.5 6.6  Hemoglobin 13.0 - 17.0 g/dL 9.0(L) 9.1(L) 8.9(L)  Hematocrit 39.0 - 52.0 % 27.6(L) 28.5(L) 27.7(L)  Platelets 150 - 400 K/uL 155 110(L) 98(L)   CMP Latest Ref Rng & Units 08/16/2019 08/15/2019 08/14/2019  Glucose 70 - 99 mg/dL 140(H) 102(H) 105(H)  BUN 8 - 23 mg/dL 13 13 13   Creatinine 0.61 - 1.24 mg/dL 0.83 0.73 0.77  Sodium 135 - 145 mmol/L 137 136 136  Potassium 3.5 - 5.1 mmol/L 4.4 4.5 4.5  Chloride 98 - 111 mmol/L 105 104 107  CO2 22 - 32 mmol/L 24 23 23   Calcium 8.9 - 10.3 mg/dL 8.3(L) 8.2(L) 8.1(L)  Total  Protein 6.5 - 8.1 g/dL - - -  Total Bilirubin 0.3 - 1.2 mg/dL - - -  Alkaline Phos 38 - 126 U/L - - -  AST 15 - 41 U/L - - -  ALT 0 - 44 U/L - - -    CXR: clear  _______________________________________________________________  Assessment and Plan: POD 3 s/p CABG PFO closure. Doing well  Neuro: pain controlled CV: on asp. Statin.  BB held for some bradycardia.  On amio for afib.  Now back in sinus Pulm: continue pulm toilet Renal: stable, gentle diuresis GI: on diet Heme: stable ID: [email or web address] Endo: SSI  Dispo: floor  Melodie Bouillon, MD 08/16/2019 11:16 AM

## 2019-08-17 ENCOUNTER — Inpatient Hospital Stay (HOSPITAL_COMMUNITY): Payer: PPO

## 2019-08-17 DIAGNOSIS — I480 Paroxysmal atrial fibrillation: Secondary | ICD-10-CM

## 2019-08-17 DIAGNOSIS — I255 Ischemic cardiomyopathy: Secondary | ICD-10-CM

## 2019-08-17 LAB — ECHOCARDIOGRAM COMPLETE
Height: 70 in
Weight: 2971.2 oz

## 2019-08-17 LAB — CBC
HCT: 27.3 % — ABNORMAL LOW (ref 39.0–52.0)
Hemoglobin: 9.1 g/dL — ABNORMAL LOW (ref 13.0–17.0)
MCH: 31.1 pg (ref 26.0–34.0)
MCHC: 33.3 g/dL (ref 30.0–36.0)
MCV: 93.2 fL (ref 80.0–100.0)
Platelets: 180 10*3/uL (ref 150–400)
RBC: 2.93 MIL/uL — ABNORMAL LOW (ref 4.22–5.81)
RDW: 13.5 % (ref 11.5–15.5)
WBC: 7.6 10*3/uL (ref 4.0–10.5)
nRBC: 0 % (ref 0.0–0.2)

## 2019-08-17 LAB — BASIC METABOLIC PANEL
Anion gap: 11 (ref 5–15)
BUN: 13 mg/dL (ref 8–23)
CO2: 23 mmol/L (ref 22–32)
Calcium: 8.2 mg/dL — ABNORMAL LOW (ref 8.9–10.3)
Chloride: 103 mmol/L (ref 98–111)
Creatinine, Ser: 0.61 mg/dL (ref 0.61–1.24)
GFR calc Af Amer: >60 mL/min (ref >60)
GFR calc non Af Amer: >60 mL/min (ref >60)
Glucose, Bld: 124 mg/dL — ABNORMAL HIGH (ref 70–99)
Potassium: 3.8 mmol/L (ref 3.5–5.1)
Sodium: 137 mmol/L (ref 135–145)

## 2019-08-17 LAB — GLUCOSE, CAPILLARY
Glucose-Capillary: 115 mg/dL — ABNORMAL HIGH (ref 70–99)
Glucose-Capillary: 121 mg/dL — ABNORMAL HIGH (ref 70–99)
Glucose-Capillary: 122 mg/dL — ABNORMAL HIGH (ref 70–99)
Glucose-Capillary: 122 mg/dL — ABNORMAL HIGH (ref 70–99)
Glucose-Capillary: 125 mg/dL — ABNORMAL HIGH (ref 70–99)

## 2019-08-17 MED ORDER — FUROSEMIDE 10 MG/ML IJ SOLN
40.0000 mg | Freq: Every day | INTRAMUSCULAR | Status: DC
  Administered 2019-08-18 – 2019-08-19 (×2): 40 mg via INTRAVENOUS
  Filled 2019-08-17 (×2): qty 4

## 2019-08-17 MED ORDER — PRAMIPEXOLE DIHYDROCHLORIDE 1.5 MG PO TABS
1.5000 mg | ORAL_TABLET | Freq: Every day | ORAL | Status: DC
  Administered 2019-08-17 – 2019-08-18 (×3): 1.5 mg via ORAL
  Filled 2019-08-17 (×4): qty 1

## 2019-08-17 MED ORDER — AMIODARONE HCL 200 MG PO TABS
200.0000 mg | ORAL_TABLET | Freq: Two times a day (BID) | ORAL | Status: DC
  Administered 2019-08-17 – 2019-08-19 (×5): 200 mg via ORAL
  Filled 2019-08-17 (×5): qty 1

## 2019-08-17 MED ORDER — METOPROLOL TARTRATE 12.5 MG HALF TABLET
12.5000 mg | ORAL_TABLET | Freq: Two times a day (BID) | ORAL | Status: DC
  Administered 2019-08-17 – 2019-08-19 (×5): 12.5 mg via ORAL
  Filled 2019-08-17 (×5): qty 1

## 2019-08-17 MED FILL — Magnesium Sulfate Inj 50%: INTRAMUSCULAR | Qty: 10 | Status: AC

## 2019-08-17 MED FILL — Heparin Sod (Porcine)-NaCl IV Soln 1000 Unit/500ML-0.9%: INTRAVENOUS | Qty: 500 | Status: AC

## 2019-08-17 MED FILL — Heparin Sodium (Porcine) Inj 1000 Unit/ML: INTRAMUSCULAR | Qty: 30 | Status: AC

## 2019-08-17 MED FILL — Potassium Chloride Inj 2 mEq/ML: INTRAVENOUS | Qty: 40 | Status: AC

## 2019-08-17 NOTE — Progress Notes (Addendum)
amio po begun this am while pt still on amio drip.  CTS PA confirmed.  Will continue to monitor. Order received to DC amio drip at 1000.

## 2019-08-17 NOTE — Progress Notes (Signed)
4 Days Post-Op Procedure(s) (LRB): CORONARY ARTERY BYPASS GRAFTING (CABG) TIMES 3. (N/A) TRANSESOPHAGEAL ECHOCARDIOGRAM (TEE) (N/A) OPEN RADIAL ARTERY HARVEST (Left) CLOSURE OF PATENT FORAMEN OVALE (N/A) Subjective: No complaints Objective: Vital signs in last 24 hours: Temp:  [97.7 F (36.5 C)-98.7 F (37.1 C)] 98.2 F (36.8 C) (03/08 0740) Pulse Rate:  [33-111] 73 (03/08 0740) Cardiac Rhythm: Normal sinus rhythm (03/08 0415) Resp:  [15-26] 20 (03/08 0740) BP: (103-142)/(53-82) 121/60 (03/08 0740) SpO2:  [95 %-100 %] 100 % (03/08 0740) FiO2 (%):  [21 %] 21 % (03/07 1123) Weight:  [84.2 kg] 84.2 kg (03/08 0500)  Hemodynamic parameters for last 24 hours:    Intake/Output from previous day: 03/07 0701 - 03/08 0700 In: 1528.5 [P.O.:1080; I.V.:448.5] Out: 1000 [Urine:1000] Intake/Output this shift: No intake/output data recorded.  General appearance: alert and cooperative Neurologic: intact Heart: regular rate and rhythm, S1, S2 normal, no murmur, click, rub or gallop Lungs: clear to auscultation bilaterally Abdomen: soft, non-tender; bowel sounds normal; no masses,  no organomegaly Extremities: edema 1+ Wound: c/d/i  Lab Results: Recent Labs    08/16/19 0500 08/17/19 0239  WBC 10.1 7.6  HGB 9.0* 9.1*  HCT 27.6* 27.3*  PLT 155 180   BMET:  Recent Labs    08/16/19 0500 08/17/19 0239  NA 137 137  K 4.4 3.8  CL 105 103  CO2 24 23  GLUCOSE 140* 124*  BUN 13 13  CREATININE 0.83 0.61  CALCIUM 8.3* 8.2*    PT/INR: No results for input(s): LABPROT, INR in the last 72 hours. ABG    Component Value Date/Time   PHART 7.323 (L) 08/14/2019 0601   HCO3 21.5 08/14/2019 0601   TCO2 23 08/14/2019 0601   ACIDBASEDEF 4.0 (H) 08/14/2019 0601   O2SAT 98.0 08/14/2019 0601   CBG (last 3)  Recent Labs    08/16/19 1644 08/16/19 2127 08/17/19 0630  GLUCAP 113* 116* 122*    Assessment/Plan: S/P Procedure(s) (LRB): CORONARY ARTERY BYPASS GRAFTING (CABG) TIMES 3.  (N/A) TRANSESOPHAGEAL ECHOCARDIOGRAM (TEE) (N/A) OPEN RADIAL ARTERY HARVEST (Left) CLOSURE OF PATENT FORAMEN OVALE (N/A) Mobilize Diuresis increase lasix dose  Add beta blocker   LOS: 5 days    Wonda Olds 08/17/2019

## 2019-08-17 NOTE — Progress Notes (Signed)
Echocardiogram 2D Echocardiogram has been performed.  Shane Massey 08/17/2019, 2:07 PM

## 2019-08-17 NOTE — Progress Notes (Addendum)
Progress Note  Patient Name: Shane Massey Date of Encounter: 08/17/2019  Primary Cardiologist: Jenne Campus, MD   Subjective   No complaints this morning. Wife at the bedside.   Inpatient Medications    Scheduled Meds: . acetaminophen  1,000 mg Oral Q6H   Or  . acetaminophen (TYLENOL) oral liquid 160 mg/5 mL  1,000 mg Per Tube Q6H  . amiodarone  200 mg Oral BID  . aspirin  81 mg Oral Daily  . bisacodyl  10 mg Oral Daily   Or  . bisacodyl  10 mg Rectal Daily  . Chlorhexidine Gluconate Cloth  6 each Topical Daily  . clopidogrel  75 mg Oral Daily  . colchicine  0.3 mg Oral BID  . docusate sodium  200 mg Oral Daily  . enoxaparin (LOVENOX) injection  40 mg Subcutaneous Q24H  . [START ON 08/18/2019] furosemide  40 mg Intravenous Daily  . insulin aspart  0-24 Units Subcutaneous TID WC  . isosorbide dinitrate  5 mg Oral TID  . levothyroxine  50 mcg Oral Q0600  . mouth rinse  15 mL Mouth Rinse BID  . metoprolol tartrate  12.5 mg Oral BID  . pantoprazole  40 mg Oral Daily  . pramipexole  1.5 mg Oral QHS  . rosuvastatin  20 mg Oral QPM  . sodium chloride flush  3 mL Intravenous Q12H  . sodium chloride flush  3 mL Intravenous Q12H   Continuous Infusions: . sodium chloride Stopped (08/14/19 0834)  . sodium chloride    . sodium chloride    . sodium chloride    . amiodarone 30 mg/hr (08/17/19 0650)  . lactated ringers Stopped (08/13/19 1530)  . lactated ringers Stopped (08/14/19 1620)   PRN Meds: sodium chloride, sodium chloride, ondansetron (ZOFRAN) IV, oxyCODONE, sodium chloride flush, sodium chloride flush, traMADol   Vital Signs    Vitals:   08/17/19 0200 08/17/19 0400 08/17/19 0500 08/17/19 0740  BP: 112/75 111/63  121/60  Pulse: 64 (!) 33  73  Resp: 16 20  20   Temp:  98 F (36.7 C)  98.2 F (36.8 C)  TempSrc:  Oral  Oral  SpO2: 95% 95%  100%  Weight:   84.2 kg   Height:        Intake/Output Summary (Last 24 hours) at 08/17/2019 0943 Last data filed at  08/17/2019 0600 Gross per 24 hour  Intake 1048.46 ml  Output 1000 ml  Net 48.46 ml   Last 3 Weights 08/17/2019 08/16/2019 08/15/2019  Weight (lbs) 185 lb 11.2 oz 188 lb 11.4 oz 188 lb 7.9 oz  Weight (kg) 84.233 kg 85.6 kg 85.5 kg      Telemetry    SR - Personally Reviewed  ECG    No new tracing this morning.  Physical Exam  Pleasant older WM, sitting up in the chair.  GEN: No acute distress.   Neck: No JVD Cardiac: RRR, no murmurs, rubs, or gallops.  Respiratory: Clear to auscultation bilaterally.  GI: Soft, nontender, non-distended.  MS: No edema; No deformity. Left forearm with clean dry bandage.  Neuro:  Nonfocal  Psych: Normal affect   Labs    High Sensitivity Troponin:   Recent Labs  Lab 08/12/19 0920 08/12/19 1230 08/12/19 1509  TROPONINIHS 19* 17 16      Chemistry Recent Labs  Lab 08/12/19 0827 08/12/19 0920 08/15/19 0501 08/16/19 0500 08/17/19 0239  NA 136   < > 136 137 137  K 3.7   < >  4.5 4.4 3.8  CL 104   < > 104 105 103  CO2 22   < > 23 24 23   GLUCOSE 155*   < > 102* 140* 124*  BUN 15   < > 13 13 13   CREATININE 0.90   < > 0.73 0.83 0.61  CALCIUM 9.0   < > 8.2* 8.3* 8.2*  PROT 6.6  --   --   --   --   ALBUMIN 3.8  --   --   --   --   AST 24  --   --   --   --   ALT 21  --   --   --   --   ALKPHOS 38  --   --   --   --   BILITOT 0.8  --   --   --   --   GFRNONAA >60   < > >60 >60 >60  GFRAA >60   < > >60 >60 >60  ANIONGAP 10   < > 9 8 11    < > = values in this interval not displayed.     Hematology Recent Labs  Lab 08/15/19 0501 08/16/19 0500 08/17/19 0239  WBC 8.5 10.1 7.6  RBC 2.90* 2.87* 2.93*  HGB 9.1* 9.0* 9.1*  HCT 28.5* 27.6* 27.3*  MCV 98.3 96.2 93.2  MCH 31.4 31.4 31.1  MCHC 31.9 32.6 33.3  RDW 13.6 13.5 13.5  PLT 110* 155 180    BNPNo results for input(s): BNP, PROBNP in the last 168 hours.   DDimer No results for input(s): DDIMER in the last 168 hours.   Radiology    DG Chest Port 1 View  Result Date: 08/16/2019  CLINICAL DATA:  CABG EXAM: PORTABLE CHEST 1 VIEW COMPARISON:  08/15/2019 FINDINGS: Right IJ sheath remains present. Interval chest tube removal. Bilateral pleural effusions and bibasilar atelectasis. No pneumothorax. Stable cardiomediastinal contours. IMPRESSION: Similar bilateral pleural effusions and bibasilar atelectasis. No pneumothorax. Electronically Signed   By: Macy Mis M.D.   On: 08/16/2019 06:16    Cardiac Studies   Cath: 08/06/19   Ost RCA lesion is 20% stenosed.  Prox RCA lesion is 20% stenosed.  Dist RCA lesion is 30% stenosed.  Mid LM to Dist LM lesion is 75% stenosed.  Ramus lesion is 80% stenosed.  1st Diag lesion is 50% stenosed.  Mid LAD-2 lesion is 5% stenosed.  Mid LAD-1 lesion is 20% stenosed.  Dist LAD lesion is 30% stenosed.  The left ventricular systolic function is normal.  LV end diastolic pressure is normal.   Severe 75% focal distal left main stenosis immediately proximal to its trifurcation into a moderate size LAD with 50% stenosis in the first diagonal vessel, 20% stenosis prior to the mid LAD stent which is patent with 30% stenosis after the stented segment; 80% stenosis in the superior branch of the ramus intermediate vessel and normal-appearing left circumflex coronary artery. RCA is a dominant vessel which has a smooth ostial tapering of 20% followed by 20% proximal and 30% distal stenoses.  LV function segment at 55%.  LVEDP 12 mm.  RECOMMENDATION: Recommend CABG revascularization surgery.  The patient has been on Plavix.  He was instructed to hold his Plavix as of today.  He will be discharged later today on medical therapy and is scheduled to see Dr. Rodena Goldmann on Monday with plans for CABG revascularization next week.   Diagnostic Dominance: Right    Patient Profile  79 y.o. male with PMH of CAD s/p stenting to mid LAD 08/2015, nonsustained VT, ischemic cardiomyopathy with improved LV function, hypertension, hyperlipidemia  hypothyroidism and obstructive sleep apnea presented to [facility], ER with dizziness/near syncope who was pending outpatient CABG. He was admitted to cardiology and taken for CABG.   Assessment & Plan    1. CAD: now s/p CABG with LIMA-LAD, radial-OM, RIMA-ramus. Progressing well. On asa, plavix, BB, isordil and statin.    2. Post op Afib: has been on IV amiodarone, and now transitioning to PO dosing. No plans for [facility] unless recurrence of Afib. On metoprolol 12.5mg  BID.    3. Near Syncope: noted on admission. Per Dr. Sallyanne Kuster, will plan for outpatient event monitoring at the time of discharge to ensure no primary conduction system disease.   4. Systolic HF: Cath report noted an EF which was normal. Intraoperative TEE reported a drop to 35-40% with global hypokinesis. He has been diuresing with IV lasix and weight is trending down. On BB therapy.  -- will order echo to confirm EF to assist with GDMT.   For questions or updates, please contact [place] Please consult [email or web address] for contact info under        Signed, Reino Bellis, NP  08/17/2019, 9:43 AM     Patient seen and examined. Agree with assessment and plan. Day 4 s/p CABG revascularization x3 on August 13, 2019 with LIMA to distal LAD, left radial artery to second obtuse marginal branch of the left circumflex coronary artery, and pedicled RIMA graft to ramus intermediate vessel. Feels well; No recurrent AF currently on metoprolol 12.5 mg bid. F/U echo today improved with EF 45 - 50%; mild LVH ; mild aortic valve sclerosis.    Troy Sine, MD, [facility] 08/17/2019 3:20 PM

## 2019-08-17 NOTE — Progress Notes (Signed)
MOBILITY TEAM - Progress Note   08/17/19 1700  Mobility  Activity Ambulated in hall  Level of Assistance Standby assist, set-up cues, supervision of patient - no hands on  Assistive Device None  Distance Ambulated (ft) 200 ft  Mobility Response Tolerated well  Bed Position  (seated in recliner)   Patient performed ambulation without DME, tolerated well with close standby assist for balance; no overt LOB or instability. Patient motivated to participate. Had already walked earlier this afternoon with wife's supervision. Encouraged continued use of RW when ambulating with family.  Mabeline Caras, PT, DPT Mobility Team Pager [phone]

## 2019-08-17 NOTE — Progress Notes (Signed)
CARDIAC REHAB PHASE I   PRE:  Rate/Rhythm: 54 SR  BP:  Sitting: 127/64      SaO2: 96 RA  MODE:  Ambulation: 180 ft   POST:  Rate/Rhythm: 80 SR  BP:  Sitting: 133/97    SaO2: 97 RA  Pt brushed teeth then ambulated 185ft in hallway standby assist with front wheel walker. Pt cut walk short due to leg weakness. Pt states he did not sleep well last night due to restless legs. Pt demonstrating 625 on IS. Encouraged continued IS use and walks. Will continue to follow and encourage ambulation.  [id number] Rufina Falco, RN BSN 08/17/2019 10:54 AM

## 2019-08-18 DIAGNOSIS — Z7901 Long term (current) use of anticoagulants: Secondary | ICD-10-CM

## 2019-08-18 LAB — GLUCOSE, CAPILLARY
Glucose-Capillary: 109 mg/dL — ABNORMAL HIGH (ref 70–99)
Glucose-Capillary: 125 mg/dL — ABNORMAL HIGH (ref 70–99)
Glucose-Capillary: 128 mg/dL — ABNORMAL HIGH (ref 70–99)
Glucose-Capillary: 108 mg/dL — ABNORMAL HIGH (ref 70–99)

## 2019-08-18 MED ORDER — LOSARTAN POTASSIUM 25 MG PO TABS
12.5000 mg | ORAL_TABLET | Freq: Every day | ORAL | Status: DC
  Administered 2019-08-18 – 2019-08-19 (×2): 12.5 mg via ORAL
  Filled 2019-08-18 (×2): qty 1

## 2019-08-18 MED ORDER — APIXABAN 5 MG PO TABS
5.0000 mg | ORAL_TABLET | Freq: Two times a day (BID) | ORAL | Status: DC
  Administered 2019-08-18 – 2019-08-19 (×3): 5 mg via ORAL
  Filled 2019-08-18 (×3): qty 1

## 2019-08-18 NOTE — Progress Notes (Signed)
5 Days Post-Op Procedure(s) (LRB): CORONARY ARTERY BYPASS GRAFTING (CABG) TIMES 3. (N/A) TRANSESOPHAGEAL ECHOCARDIOGRAM (TEE) (N/A) OPEN RADIAL ARTERY HARVEST (Left) CLOSURE OF PATENT FORAMEN OVALE (N/A) Subjective: Up in the bedside chair. Didn't rest very well due to cough when he gets supine. Doing well with mobility and is tolerating the cardiac diet.     Objective: Vital signs in last 24 hours: Temp:  [97.9 F (36.6 C)-98.3 F (36.8 C)] 97.9 F (36.6 C) (03/09 0750) Pulse Rate:  [64-98] 71 (03/09 0750) Cardiac Rhythm: Normal sinus rhythm (03/09 0743) Resp:  [15-21] 15 (03/09 0750) BP: (120-134)/(57-85) 134/69 (03/09 0750) SpO2:  [96 %-98 %] 98 % (03/09 0750)    Intake/Output from previous day: 03/08 0701 - 03/09 0700 In: 333.5 [P.O.:240; I.V.:93.5] Out: -  Intake/Output this shift: No intake/output data recorded.  General appearance: alert, cooperative and mild distress Neurologic: intact Heart: Curently in SR wut has had several episodes of PAF over past 24 hours. Was ion SR in the 40's briefly when he converted out ot AF at around 3am today, Lungs: Breath sounds are clear. Abdomen: soft and non-tender. Extremities: Minimal LE edema, left arm incision intact and dry.  Wound: the sternal incision has a few areas of dried bloody drainage. O/W intact and dry.   Lab Results: Recent Labs    08/16/19 0500 08/17/19 0239  WBC 10.1 7.6  HGB 9.0* 9.1*  HCT 27.6* 27.3*  PLT 155 180   BMET:  Recent Labs    08/16/19 0500 08/17/19 0239  NA 137 137  K 4.4 3.8  CL 105 103  CO2 24 23  GLUCOSE 140* 124*  BUN 13 13  CREATININE 0.83 0.61  CALCIUM 8.3* 8.2*    PT/INR: No results for input(s): LABPROT, INR in the last 72 hours. ABG    Component Value Date/Time   PHART 7.323 (L) 08/14/2019 0601   HCO3 21.5 08/14/2019 0601   TCO2 23 08/14/2019 0601   ACIDBASEDEF 4.0 (H) 08/14/2019 0601   O2SAT 98.0 08/14/2019 0601   CBG (last 3)  Recent Labs    08/17/19 2113  08/17/19 2347 08/18/19 0425  GLUCAP 122* 115* 109*    Assessment/Plan: S/P Procedure(s) (LRB): CORONARY ARTERY BYPASS GRAFTING (CABG) TIMES 3. (N/A) TRANSESOPHAGEAL ECHOCARDIOGRAM (TEE) (N/A) OPEN RADIAL ARTERY HARVEST (Left) CLOSURE OF PATENT FORAMEN OVALE (N/A)   -POD5 CABG and closure of PFO. Progressing well overall, Good mobility. He plans to eventually return home with his wife. D/C pacer wires today.   -PAF- on oral amiodarone and low-dose metoprolol. Will ask pharmacy to dose Eliquis, d/c Plavix, and continue ASA. D/C Lovenox.  -Disposition- Anticipate discharge 1-2-days   LOS: 6 days    Antony Odea, PA-C [phone] 08/18/2019

## 2019-08-18 NOTE — Progress Notes (Signed)
ANTICOAGULATION CONSULT NOTE - Initial Consult  Pharmacy Consult for Apixaban Indication: atrial fibrillation  No Known Allergies  Patient Measurements: Height: 5\' 10"  (177.8 cm) Weight: 185 lb 11.2 oz (84.2 kg) IBW/kg (Calculated) : 73   Vital Signs: Temp: 98 F (36.7 C) (03/09 1103) Temp Source: Oral (03/09 1103) BP: 139/79 (03/09 1103) Pulse Rate: 73 (03/09 1103)  Labs: Recent Labs    08/16/19 0500 08/17/19 0239  HGB 9.0* 9.1*  HCT 27.6* 27.3*  PLT 155 180  CREATININE 0.83 0.61    Estimated Creatinine Clearance: 81.1 mL/min (by C-G formula based on SCr of 0.61 mg/dL).   Medical History: Past Medical History:  Diagnosis Date  . Coronary artery disease   . Dyspnea   . Dysrhythmia   . GERD (gastroesophageal reflux disease)   . Hypertension   . Hypothyroidism   . Myocardial infarction ([place])   . OSA (obstructive sleep apnea)     Assessment: 78yom s/p CABG with post op Afib on amiodarone and colchicine.  Continues to be in/out Afib.  NO bleeding noted, h/h and pltc stable, Cr 0.6, age < 65, wt > 60kg  Goal of Therapy:  Monitor platelets by anticoagulation protocol: Yes   Plan:  Apixaban 5mg  BID Monitor s/s bleeding and CBC   Bonnita Nasuti Pharm.D. CPP, BCPS Clinical Pharmacist [phone] 08/18/2019 11:54 AM

## 2019-08-18 NOTE — Progress Notes (Signed)
CARDIAC REHAB PHASE I   PRE:  Rate/Rhythm: 60 SR  BP:  Sitting: 129/72      SaO2: 96 RA  MODE:  Ambulation: 270 ft   POST:  Rate/Rhythm: 76 SR  BP:  Sitting: 139/79    SaO2: 99 RA  Pt ambulated 186ft in hallway with walker, and an additional 128ft without walker. Pt denies pain but does c/o some SOB due to the mask. Pt returned to recliner, call bell and bedside table within reach. Pt demonstrating 750 on IS. Encouraged continued IS use and walks. Will continue to follow.  [id number] Shane Falco, RN BSN 08/18/2019 11:09 AM

## 2019-08-18 NOTE — Progress Notes (Signed)
CARDIAC REHAB PHASE I   Went to offer to walk with pt. Pt walking NT. Pt denies pain, or SOB. Does c/o feeling tired but did not sleep well last night. Will f/u later today and encourage ambulation.  Rufina Falco, RN BSN 08/18/2019 8:14 AM

## 2019-08-18 NOTE — Progress Notes (Addendum)
Progress Note  Patient Name: Shane Massey Date of Encounter: 08/18/2019  Primary Cardiologist: Jenne Campus, MD   Subjective   Sitting up in the chair this morning. No complaints.   Inpatient Medications    Scheduled Meds: . acetaminophen  1,000 mg Oral Q6H   Or  . acetaminophen (TYLENOL) oral liquid 160 mg/5 mL  1,000 mg Per Tube Q6H  . amiodarone  200 mg Oral BID  . aspirin  81 mg Oral Daily  . bisacodyl  10 mg Oral Daily   Or  . bisacodyl  10 mg Rectal Daily  . colchicine  0.3 mg Oral BID  . docusate sodium  200 mg Oral Daily  . furosemide  40 mg Intravenous Daily  . insulin aspart  0-24 Units Subcutaneous TID WC  . isosorbide dinitrate  5 mg Oral TID  . levothyroxine  50 mcg Oral Q0600  . mouth rinse  15 mL Mouth Rinse BID  . metoprolol tartrate  12.5 mg Oral BID  . pantoprazole  40 mg Oral Daily  . pramipexole  1.5 mg Oral QHS  . rosuvastatin  20 mg Oral QPM  . sodium chloride flush  3 mL Intravenous Q12H  . sodium chloride flush  3 mL Intravenous Q12H   Continuous Infusions: . sodium chloride Stopped (08/14/19 0834)  . sodium chloride    . sodium chloride    . sodium chloride    . lactated ringers Stopped (08/13/19 1530)  . lactated ringers Stopped (08/14/19 1620)   PRN Meds: sodium chloride, sodium chloride, ondansetron (ZOFRAN) IV, oxyCODONE, sodium chloride flush, sodium chloride flush, traMADol   Vital Signs    Vitals:   08/18/19 0750 08/18/19 0945 08/18/19 1000 08/18/19 1103  BP: 134/69 129/64 129/61 139/79  Pulse: 71 66 63 73  Resp: 15 19 17 16   Temp: 97.9 F (36.6 C)   98 F (36.7 C)  TempSrc: Oral   Oral  SpO2: 98% 98% 95% 95%  Weight:      Height:        Intake/Output Summary (Last 24 hours) at 08/18/2019 1129 Last data filed at 08/18/2019 0900 Gross per 24 hour  Intake 333.53 ml  Output --  Net 333.53 ml   Last 3 Weights 08/17/2019 08/16/2019 08/15/2019  Weight (lbs) 185 lb 11.2 oz 188 lb 11.4 oz 188 lb 7.9 oz  Weight (kg) 84.233  kg 85.6 kg 85.5 kg      Telemetry    SR, but bursts of Afib yesterday afternoon and evening - Personally Reviewed  ECG    No new tracing this morning.   Physical Exam  Pleasant older WM, sitting in chair.  GEN: No acute distress.   Neck: No JVD Cardiac: RRR, no murmurs, rubs, or gallops.  Respiratory: Clear to auscultation bilaterally. GI: Soft, nontender, non-distended  MS: No edema; No deformity. Neuro:  Nonfocal  Psych: Normal affect   Labs    High Sensitivity Troponin:   Recent Labs  Lab 08/12/19 0920 08/12/19 1230 08/12/19 1509  TROPONINIHS 19* 17 16      Chemistry Recent Labs  Lab 08/12/19 0827 08/12/19 0920 08/15/19 0501 08/16/19 0500 08/17/19 0239  NA 136   < > 136 137 137  K 3.7   < > 4.5 4.4 3.8  CL 104   < > 104 105 103  CO2 22   < > 23 24 23   GLUCOSE 155*   < > 102* 140* 124*  BUN 15   < >  13 13 13   CREATININE 0.90   < > 0.73 0.83 0.61  CALCIUM 9.0   < > 8.2* 8.3* 8.2*  PROT 6.6  --   --   --   --   ALBUMIN 3.8  --   --   --   --   AST 24  --   --   --   --   ALT 21  --   --   --   --   ALKPHOS 38  --   --   --   --   BILITOT 0.8  --   --   --   --   GFRNONAA >60   < > >60 >60 >60  GFRAA >60   < > >60 >60 >60  ANIONGAP 10   < > 9 8 11    < > = values in this interval not displayed.     Hematology Recent Labs  Lab 08/15/19 0501 08/16/19 0500 08/17/19 0239  WBC 8.5 10.1 7.6  RBC 2.90* 2.87* 2.93*  HGB 9.1* 9.0* 9.1*  HCT 28.5* 27.6* 27.3*  MCV 98.3 96.2 93.2  MCH 31.4 31.4 31.1  MCHC 31.9 32.6 33.3  RDW 13.6 13.5 13.5  PLT 110* 155 180    BNPNo results for input(s): BNP, PROBNP in the last 168 hours.   DDimer No results for input(s): DDIMER in the last 168 hours.   Radiology    ECHOCARDIOGRAM COMPLETE  Result Date: 08/17/2019    ECHOCARDIOGRAM REPORT   Patient Name:   Shane Massey Date of Exam: 08/17/2019 Medical Rec #:  [id number]      Height:       70.0 in Accession #:    [id number]     Weight:       185.7 lb Date of  Birth:  [birth year]      BSA:          2.023 m Patient Age:    79 years       BP:           120/66 mmHg Patient Gender: M              HR:           80 bpm. 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 4 days post-CABG.  Sonographer:    Raquel Sarna Senior RDCS Referring Phys: [place]  1. Mild global reduction in LV systolic function; mild LVH and LVE.  2. Left ventricular ejection fraction, by estimation, is 45 to 50%. The left ventricle has mildly decreased function. The left ventricle demonstrates global hypokinesis. The left ventricular internal cavity size was mildly dilated. There is mild left ventricular hypertrophy. Left ventricular diastolic function could not be evaluated.  3. Right ventricular systolic function is normal. The right ventricular size is normal.  4. The mitral valve is normal in structure. Trivial mitral valve regurgitation. No evidence of mitral stenosis.  5. The aortic valve is tricuspid. Aortic valve regurgitation is not visualized. Mild aortic valve sclerosis is present, with no evidence of aortic valve stenosis.  6. The inferior vena cava is dilated in size with <50% respiratory variability, suggesting right atrial pressure of 15 mmHg. FINDINGS  Left Ventricle: Left ventricular ejection fraction, by estimation, is 45 to 50%. The left ventricle has mildly decreased function. The left ventricle demonstrates global hypokinesis. The left ventricular internal cavity size was mildly dilated. There is  mild left ventricular hypertrophy. Left ventricular diastolic function could not be evaluated due to atrial fibrillation. Left ventricular diastolic function could not be evaluated. Right Ventricle: The right ventricular size is normal. Right ventricular systolic function is normal.  Left Atrium: Left atrial size was normal in size. Right Atrium: Right atrial size was normal in size. Pericardium: There is no evidence of pericardial effusion. Mitral Valve: The mitral valve is normal in structure. Normal mobility of the mitral valve leaflets. Mild mitral annular calcification. Trivial mitral valve regurgitation. No evidence of mitral valve stenosis. Tricuspid Valve: The tricuspid valve is normal in structure. Tricuspid valve regurgitation is trivial. No evidence of tricuspid stenosis. Aortic Valve: The aortic valve is tricuspid. Aortic valve regurgitation is not visualized. Mild aortic valve sclerosis is present, with no evidence of aortic valve stenosis. Pulmonic Valve: The pulmonic valve was normal in structure. Pulmonic valve regurgitation is trivial. No evidence of pulmonic stenosis. Aorta: The aortic root is normal in size and structure. Venous: The inferior vena cava is dilated in size with less than 50% respiratory variability, suggesting right atrial pressure of 15 mmHg.  Additional Comments: Mild global reduction in LV systolic function; mild LVH and LVE.  LEFT VENTRICLE PLAX 2D LVIDd:         5.40 cm LVIDs:         3.80 cm LV PW:         1.20 cm LV IVS:        1.20 cm LVOT diam:     2.20 cm LV SV:         48 LV SV Index:   24 LVOT Area:     3.80 cm  RIGHT VENTRICLE RV S prime:     4.80 cm/s TAPSE (M-mode): 1.2 cm LEFT ATRIUM             Index       RIGHT ATRIUM           Index LA diam:        4.60 cm 2.27 cm/m  RA Area:     16.00 cm LA Vol (A2C):   82.0 ml 40.54 ml/m RA Volume:   37.80 ml  18.69 ml/m LA Vol (A4C):   59.8 ml 29.57 ml/m LA Biplane Vol: 70.8 ml 35.01 ml/m  AORTIC VALVE LVOT Vmax:   78.00 cm/s LVOT Vmean:  54.200 cm/s LVOT VTI:    0.126 m  AORTA Ao Root diam: 3.20 cm Ao Asc diam:  3.40 cm  SHUNTS Systemic VTI:  0.13 m Systemic Diam: 2.20 cm Kirk Ruths MD Electronically signed by Kirk Ruths MD Signature Date/Time: 08/17/2019/2:35:16 PM    Final     Cardiac  Studies   Cath: 08/06/19   Ost RCA lesion is 20% stenosed.  Prox RCA lesion is 20% stenosed.  Dist RCA lesion is 30% stenosed.  Mid LM to Dist LM lesion is 75% stenosed.  Ramus lesion is 80% stenosed.  1st Diag lesion is 50% stenosed.  Mid LAD-2 lesion is 5% stenosed.  Mid LAD-1 lesion is 20% stenosed.  Dist LAD lesion is 30% stenosed.  The left ventricular systolic function is normal.  LV end diastolic pressure is normal.  Severe 75% focal distal left main stenosis immediately proximal to its trifurcation into a  moderate size LAD with 50% stenosis in the first diagonal vessel, 20% stenosis prior to the mid LAD stent which is patent with 30% stenosis after the stented segment; 80% stenosis in the superior branch of the ramus intermediate vessel and normal-appearing left circumflex coronary artery. RCA is a dominant vessel which has a smooth ostial tapering of 20% followed by 20% proximal and 30% distal stenoses.  LV function segment at 55%. LVEDP 12 mm.  RECOMMENDATION: Recommend CABG revascularization surgery. The patient has been on Plavix. He was instructed to hold his Plavix as of today. He will be discharged later today on medical therapy and is scheduled to see Dr. Rodena Goldmann on Monday with plans for CABG revascularization next week.   Diagnostic Dominance: Right   Echo: 08/17/19  IMPRESSIONS    1. Mild global reduction in LV systolic function; mild LVH and LVE.  2. Left ventricular ejection fraction, by estimation, is 45 to 50%. The  left ventricle has mildly decreased function. The left ventricle  demonstrates global hypokinesis. The left ventricular internal cavity size  was mildly dilated. There is mild left  ventricular hypertrophy. Left ventricular diastolic function could not be  evaluated.  3. Right ventricular systolic function is normal. The right ventricular  size is normal.  4. The mitral valve is normal in structure. Trivial mitral valve   regurgitation. No evidence of mitral stenosis.  5. The aortic valve is tricuspid. Aortic valve regurgitation is not  visualized. Mild aortic valve sclerosis is present, with no evidence of  aortic valve stenosis.  6. The inferior vena cava is dilated in size with <50% respiratory  variability, suggesting right atrial pressure of 15 mmHg.   Patient Profile     79 y.o. male with PMH of CAD s/p stenting to mid LAD 08/2015, nonsustained VT, ischemic cardiomyopathy with improved LV function, hypertension, hyperlipidemia hypothyroidism and obstructive sleep apnea presented to [facility], ER with dizziness/near syncope who was pending outpatient CABG. He was admitted to cardiology and taken for CABG.   Assessment & Plan    1. CAD: now s/p CABG with LIMA-LAD, radial-OM, RIMA-ramus. Progressing well. On asa, BB, isordil and statin.    2. Post op Afib: was on IV amiodarone, now on amiodarone 200mg  BID. Had some bursts of afib yesterday afternoon. Per TCTS plan to stop plavix and start Eliquis today.   3. Near Syncope: noted on admission. Per Dr. Sallyanne Kuster, will plan for outpatient event monitoring at the time of discharge to ensure no primary conduction system disease.   4. Systolic HF: Cath report noted an EF which was normal. Intraoperative TEE reported a drop to 35-40% with global hypokinesis. He has been diuresing with IV lasix and weight is trending down. On BB therapy.  -- Echo yesterday showed improved EF of 45-50% with global hypokinesis. Would recommend adding low losartan today.    For questions or updates, please contact [place] Please consult [email or web address] for contact info under       Signed, Reino Bellis, NP  08/18/2019, 11:29 AM     Patient seen and examined. Agree with assessment and plan. Day 5 s/p CABGx3.  Now on oral amiodarone 200 mg twice a day;  maintaining normal sinus rhythm, rate currently in the 60s.   Eliquis initiated today.   Troy Sine, MD,  [facility] 08/18/2019 5:33 PM

## 2019-08-18 NOTE — Progress Notes (Signed)
MOBILITY TEAM - Progress Note   08/18/19 1700  Mobility  Activity Ambulated in hall  Level of Assistance Standby assist, set-up cues, supervision of patient - no hands on  Assistive Device None  Distance Ambulated (ft) 280 ft  Mobility Response Tolerated well  Bed Position  (seated in recliner)   Pt walked without DME, requiring 1x seated rest break secondary to fatigue and SOB. Patient reports having performed multiple bouts of hallway ambulation with wife and use of rolling walker already today.  Mabeline Caras, PT, DPT Mobility Team Pager [phone]

## 2019-08-18 NOTE — Progress Notes (Addendum)
MOBILITY TEAM - Progress Note   08/18/19 0928  Mobility  Activity Ambulated in room;Ambulated to bathroom  Level of Assistance Standby assist, set-up cues, supervision of patient - no hands on  Assistive Device None  Distance Ambulated (ft) 30 ft  Mobility Response Tolerated well  Bed Position Semi-fowlers   Patient walked with NT earlier this morning, but agreeable to additional hallway ambulation. Ambulating well throughout room without DME at [place]. Further distance limited by PA's arrival to remove pacer wires. Will follow-up to continue mobility progression.  Mabeline Caras, PT, DPT Mobility Team Pager [phone]

## 2019-08-19 ENCOUNTER — Other Ambulatory Visit: Payer: Self-pay | Admitting: Cardiothoracic Surgery

## 2019-08-19 DIAGNOSIS — Z951 Presence of aortocoronary bypass graft: Secondary | ICD-10-CM

## 2019-08-19 LAB — GLUCOSE, CAPILLARY: Glucose-Capillary: 96 mg/dL (ref 70–99)

## 2019-08-19 MED ORDER — APIXABAN 5 MG PO TABS: 5.0000 mg | ORAL_TABLET | Freq: Two times a day (BID) | ORAL | 1 refills | Status: DC

## 2019-08-19 MED ORDER — AMIODARONE HCL 200 MG PO TABS: 200.0000 mg | ORAL_TABLET | Freq: Two times a day (BID) | ORAL | 1 refills | Status: DC

## 2019-08-19 MED ORDER — METOPROLOL TARTRATE 25 MG PO TABS: 12.5000 mg | ORAL_TABLET | Freq: Two times a day (BID) | ORAL | 1 refills | Status: DC

## 2019-08-19 MED ORDER — COLCHICINE 0.6 MG PO TABS: 0.3000 mg | ORAL_TABLET | Freq: Two times a day (BID) | ORAL | 0 refills | Status: DC

## 2019-08-19 MED ORDER — LOSARTAN POTASSIUM 25 MG PO TABS: 12.5000 mg | ORAL_TABLET | Freq: Every day | ORAL | 2 refills | Status: DC

## 2019-08-19 MED ORDER — TRAMADOL HCL 50 MG PO TABS: 50.0000 mg | ORAL_TABLET | Freq: Four times a day (QID) | ORAL | 0 refills | Status: AC | PRN

## 2019-08-19 MED FILL — LOSARTAN POTASSIUM 25 MG TA: 25 | 30 days supply | Qty: 15 | Fill #0

## 2019-08-19 MED FILL — METOPROLOL TARTRATE 25 MG T: 25 | 30 days supply | Qty: 30 | Fill #0

## 2019-08-19 MED FILL — AMIODARONE HCL 200 MG TAB: 200 | 30 days supply | Qty: 60 | Fill #0

## 2019-08-19 MED FILL — COLCHICINE 0.6 MG TABS: 0.6 | 15 days supply | Qty: 15 | Fill #0

## 2019-08-19 MED FILL — traMADol HCL 50 MG TABS: 50 | 5 days supply | Qty: 20 | Fill #0

## 2019-08-19 MED FILL — ELIQUIS 5 MG TABLET: 5 | 30 days supply | Qty: 60 | Fill #0

## 2019-08-19 MED FILL — Sodium Chloride IV Soln 0.9%: INTRAVENOUS | Qty: 2000 | Status: AC

## 2019-08-19 MED FILL — Lidocaine HCl Local Soln Prefilled Syringe 100 MG/5ML (2%): INTRAMUSCULAR | Qty: 5 | Status: AC

## 2019-08-19 MED FILL — Sodium Bicarbonate IV Soln 8.4%: INTRAVENOUS | Qty: 50 | Status: AC

## 2019-08-19 MED FILL — Heparin Sodium (Porcine) Inj 1000 Unit/ML: INTRAMUSCULAR | Qty: 10 | Status: AC

## 2019-08-19 MED FILL — Electrolyte-R (PH 7.4) Solution: INTRAVENOUS | Qty: 4000 | Status: AC

## 2019-08-19 NOTE — Care Management Important Message (Signed)
Important Message  Patient Details  Name: Shane Massey MRN: [id number] Date of Birth: Apr 11, 1941   Medicare Important Message Given:  Yes     Shelda Altes 08/19/2019, 10:33 AM

## 2019-08-19 NOTE — Discharge Instructions (Signed)
Discharge Instructions:  1. You may shower, please wash incisions daily with soap and water and keep dry. Change the dressing to the sternal incision twice daily using dry gauze until the drainage has stopped.   No tub baths or swimming until incisions have completely healed.  If your incisions become red or develop any drainage please call our office at [phone]  2. No Driving until cleared by Dr. Orvan Seen office and you are no longer using narcotic pain medications  3. Monitor your weight daily.. Please use the same scale and weigh at same time... If you gain 5-10 lbs in 48 hours with associated lower extremity swelling, please contact our office at [phone]  4. Fever of 101.5 for at least 24 hours with no source, please contact our office at [phone]  5. Activity- up as tolerated, please walk at least 3 times per day.  Avoid strenuous activity, no lifting, pushing, or pulling with your arms over 8-10 lbs for a minimum of 6 weeks  6. If any questions or concerns arise, please do not hesitate to contact our office at [phone]      Information on my medicine - ELIQUIS (apixaban)  This medication education was reviewed with me or my healthcare representative as part of my discharge preparation.  The pharmacist that spoke with me during my hospital stay was:  Maryan Char Lippucci, RPH  Why was Eliquis prescribed for you? Eliquis was prescribed for you to reduce the risk of a blood clot forming that can cause a stroke if you have a medical condition called atrial fibrillation (a type of irregular heartbeat).  What do You need to know about Eliquis ? Take your Eliquis TWICE DAILY - one tablet in the morning and one tablet in the evening with or without food. If you have difficulty swallowing the tablet whole please discuss with your pharmacist how to take the medication safely.  Take Eliquis exactly as prescribed by your doctor and DO NOT stop taking Eliquis without talking  to the doctor who prescribed the medication.  Stopping may increase your risk of developing a stroke.  Refill your prescription before you run out.  After discharge, you should have regular check-up appointments with your healthcare provider that is prescribing your Eliquis.  In the future your dose may need to be changed if your kidney function or weight changes by a significant amount or as you get older.  What do you do if you miss a dose? If you miss a dose, take it as soon as you remember on the same day and resume taking twice daily.  Do not take more than one dose of ELIQUIS at the same time to make up a missed dose.  Important Safety Information A possible side effect of Eliquis is bleeding. You should call your healthcare provider right away if you experience any of the following: ? Bleeding from an injury or your nose that does not stop. ? Unusual colored urine (red or dark brown) or unusual colored stools (red or black). ? Unusual bruising for unknown reasons. ? A serious fall or if you hit your head (even if there is no bleeding).  Some medicines may interact with Eliquis and might increase your risk of bleeding or clotting while on Eliquis. To help avoid this, consult your healthcare provider or pharmacist prior to using any new prescription or non-prescription medications, including herbals, vitamins, non-steroidal anti-inflammatory drugs (NSAIDs) and supplements.  This website has more information on Eliquis (apixaban): http://www.eliquis.com/eliquis/home

## 2019-08-19 NOTE — Progress Notes (Signed)
Discussed sternal precautions, IS, exercise, diet, and CRPII with pt and wife. Receptive. Will refer to [place].  [id number] [place], ACSM 11:52 AM 08/19/2019

## 2019-08-19 NOTE — TOC Benefit Eligibility Note (Signed)
Transition of Care [facility]) Benefit Eligibility Note    Patient Details  Name: Shane Massey MRN: [id number] Date of Birth: 06/13/1940   Medication/Dose: Eliquis 5mg   Covered?: Yes  Tier: 3 Drug  Prescription Coverage Preferred Pharmacy: Any retail Pharmacy  Spoke with Person/Company/Phone Number:: [place] [employer]/ [phone]  Co-Pay: 45.00 for a 30 Day Supply retail/ 90.00 for a 90 Day Supply Mail Order  Prior Approval: No          Orbie Pyo Phone Number: 08/19/2019, 8:42 AM

## 2019-08-19 NOTE — Progress Notes (Signed)
08/19/2019 12:08 PM Discharge AVS meds taken today and those due this evening reviewed.  Follow-up appointments and when to call md reviewed.  D/C IV and TELE.  Questions and concerns addressed.   D/C home per orders. [place]

## 2019-08-19 NOTE — Progress Notes (Signed)
6 Days Post-Op Procedure(s) (LRB): CORONARY ARTERY BYPASS GRAFTING (CABG) TIMES 3. (N/A) TRANSESOPHAGEAL ECHOCARDIOGRAM (TEE) (N/A) OPEN RADIAL ARTERY HARVEST (Left) CLOSURE OF PATENT FORAMEN OVALE (N/A) Subjective: Up walking around in his room independently this morning. No new concerns.   Objective: Vital signs in last 24 hours: Temp:  [97.8 F (36.6 C)-98.4 F (36.9 C)] 98.1 F (36.7 C) (03/10 0740) Pulse Rate:  [58-99] 92 (03/10 0740) Cardiac Rhythm: Atrial fibrillation (03/10 0603) Resp:  [16-21] 20 (03/10 0740) BP: (111-139)/(57-79) 130/69 (03/10 0740) SpO2:  [94 %-99 %] 97 % (03/10 0740) Weight:  [85.3 kg] 85.3 kg (03/10 0335)     Intake/Output from previous day: 03/09 0701 - 03/10 0700 In: 240 [P.O.:240] Out: 400 [Urine:400] Intake/Output this shift: No intake/output data recorded.  Physical Exam: General appearance: alert, cooperative and mild distress Neurologic: intact Heart: Back in A fib this AM with CVR. Mostly SR yesterday and last night.  Lungs: Breath sounds are clear. Abdomen: soft and non-tender. Extremities: Minimal LE edema, left arm incision intact and dry.  Wound: the sternal incision has a focus of serous drainage about 10cm from the lower end of the incision. This is not purulent, the sternum is stable.   O/W intact and dry.   Lab Results: Recent Labs    08/17/19 0239  WBC 7.6  HGB 9.1*  HCT 27.3*  PLT 180   BMET:  Recent Labs    08/17/19 0239  NA 137  K 3.8  CL 103  CO2 23  GLUCOSE 124*  BUN 13  CREATININE 0.61  CALCIUM 8.2*    PT/INR: No results for input(s): LABPROT, INR in the last 72 hours. ABG    Component Value Date/Time   PHART 7.323 (L) 08/14/2019 0601   HCO3 21.5 08/14/2019 0601   TCO2 23 08/14/2019 0601   ACIDBASEDEF 4.0 (H) 08/14/2019 0601   O2SAT 98.0 08/14/2019 0601   CBG (last 3)  Recent Labs    08/18/19 1623 08/18/19 2147 08/19/19 0556  GLUCAP 128* 108* 96    Assessment/Plan: S/P Procedure(s)  (LRB): CORONARY ARTERY BYPASS GRAFTING (CABG) TIMES 3. (N/A) TRANSESOPHAGEAL ECHOCARDIOGRAM (TEE) (N/A) OPEN RADIAL ARTERY HARVEST (Left) CLOSURE OF PATENT FORAMEN OVALE (N/A)  POD6 CABG and closure of PFO. Progressing well overall, Good mobility. Plan discharge today.   -PAF- on oral amiodarone and low-dose metoprolol. Rate controlled. Eliquis added yesterday.   -Expected acute blood loss anemia- No indication for transfusion.   --Dyslipidemia- Crestor resumed.    LOS: 7 days    Antony Odea, [place] [phone] 08/19/2019

## 2019-08-24 ENCOUNTER — Ambulatory Visit: Payer: Self-pay | Admitting: Cardiothoracic Surgery

## 2019-08-24 ENCOUNTER — Ambulatory Visit: Payer: PPO | Admitting: Cardiothoracic Surgery

## 2019-08-31 ENCOUNTER — Ambulatory Visit
Admission: RE | Admit: 2019-08-31 | Discharge: 2019-08-31 | Disposition: A | Discharge status: Home / Self Care | Payer: PPO | Source: Ambulatory Visit | Attending: Cardiothoracic Surgery | Admitting: Cardiothoracic Surgery

## 2019-08-31 ENCOUNTER — Other Ambulatory Visit: Payer: Self-pay

## 2019-08-31 ENCOUNTER — Ambulatory Visit (INDEPENDENT_AMBULATORY_CARE_PROVIDER_SITE_OTHER): Payer: Self-pay | Admitting: Cardiothoracic Surgery

## 2019-08-31 VITALS — BP 134/73 | HR 66 | Temp 97.6°F | Resp 20 | Ht 70.0 in | Wt 177.0 lb

## 2019-08-31 DIAGNOSIS — Z951 Presence of aortocoronary bypass graft: Secondary | ICD-10-CM

## 2019-08-31 DIAGNOSIS — I2510 Atherosclerotic heart disease of native coronary artery without angina pectoris: Secondary | ICD-10-CM

## 2019-08-31 DIAGNOSIS — R0602 Shortness of breath: Secondary | ICD-10-CM | POA: Diagnosis not present

## 2019-09-01 NOTE — Progress Notes (Signed)
[address]       [place] [address]             [phone]     CARDIOTHORACIC SURGERY OFFICE NOTE  Referring Provider is Park Liter, MD Primary Cardiologist is Jenne Campus, MD PCP is Maryella Shivers, MD   HPI:  79 yo man recently underwent CABG for severe LM CAD. He did reasonably well except for transient atrial fibrillation. He returns to office routinely after recent discharge. Reports mild DOE but this is improving. He denies syncope or chest pain.    Current Outpatient Medications  Medication Sig Dispense Refill  . amiodarone (PACERONE) 200 MG tablet Take 1 tablet (200 mg total) by mouth 2 (two) times daily. 60 tablet 1  . apixaban (ELIQUIS) 5 MG TABS tablet Take 1 tablet (5 mg total) by mouth 2 (two) times daily. 60 tablet 1  . ascorbic acid ([employer] VITAMIN C) 500 MG tablet Take 500 mg by mouth daily at 12 noon.    Marland Kitchen aspirin EC 81 MG tablet Take 81 mg by mouth daily.    . Calcium Carb-Cholecalciferol (CALCIUM 600+D3 PO) Take 1 tablet by mouth daily at 12 noon.    . colchicine 0.6 MG tablet Take 0.5 tablets (0.3 mg total) by mouth 2 (two) times daily. 15 tablet 0  . dexlansoprazole (DEXILANT) 60 MG capsule Take 60 mg by mouth daily before breakfast.    . Glycerin-Hypromellose-PEG 400 (DRY EYE RELIEF DROPS) 0.2-0.2-1 % SOLN Place 1 drop into both eyes 3 (three) times daily as needed (dry/irritated eyes.).    Marland Kitchen levothyroxine (SYNTHROID, LEVOTHROID) 50 MCG tablet Take 50 mcg by mouth daily before breakfast.    . losartan (COZAAR) 25 MG tablet Take 0.5 tablets (12.5 mg total) by mouth daily. 30 tablet 2  . metoprolol tartrate (LOPRESSOR) 25 MG tablet Take 0.5 tablets (12.5 mg total) by mouth 2 (two) times daily. 30 tablet 1  . Multiple Vitamin (MULTIVITAMIN WITH MINERALS) TABS tablet Take 1 tablet by mouth daily at 12 noon.    . pramipexole (MIRAPEX) 1.5 MG tablet Take 1.5 mg by mouth at bedtime.     . rosuvastatin (CRESTOR) 20 MG tablet Take  1 tablet (20 mg total) by mouth daily. (Patient taking differently: Take 20 mg by mouth every evening. ) 90 tablet 1   No current facility-administered medications for this visit.      Physical Exam:   BP 134/73   Pulse 66   Temp 97.6 F (36.4 C) (Skin)   Resp 20   Ht 5\' 10"  (1.778 m)   Wt 80.3 kg   SpO2 97% Comment: RA  BMI 25.40 kg/m   General:  Well-appearing, NAd  Chest:   cta  CV:   rrr  Incisions:  Well-healed  Abdomen:  stndn  Extremities:  No edema; well-healed left radial artery harvesting incision.  Diagnostic Tests:  Very small left pleural effusion   Impression:  Doing very well after CABG. He appears back in NSR  Plan:  Continue present management; f/u in 2 weeks.   I spent in excess of 20 minutes during the conduct of this office consultation and >50% of this time involved direct face-to-face encounter with the patient for counseling and/or coordination of their care.  Level 2                 10 minutes Level 3  15 minutes Level 4                 25 minutes Level 5                 40 minutes  B. Murvin Natal, MD 09/01/2019 12:00 PM

## 2019-09-08 ENCOUNTER — Telehealth: Payer: Self-pay | Admitting: Cardiology

## 2019-09-08 NOTE — Telephone Encounter (Signed)
Patient's wife calling to say that she is coming with husband to his appt. She states she spoke with Dr. Agustin Cree prior to Mr Larabee's procedure and got the ok from the provider.

## 2019-09-09 ENCOUNTER — Ambulatory Visit (INDEPENDENT_AMBULATORY_CARE_PROVIDER_SITE_OTHER): Payer: PPO | Admitting: Cardiology

## 2019-09-09 ENCOUNTER — Other Ambulatory Visit: Payer: Self-pay

## 2019-09-09 ENCOUNTER — Encounter: Payer: Self-pay | Admitting: Cardiology

## 2019-09-09 VITALS — BP 110/60 | HR 64 | Ht 70.0 in | Wt 177.0 lb

## 2019-09-09 DIAGNOSIS — I255 Ischemic cardiomyopathy: Secondary | ICD-10-CM

## 2019-09-09 DIAGNOSIS — I4729 Other ventricular tachycardia: Secondary | ICD-10-CM

## 2019-09-09 DIAGNOSIS — R7303 Prediabetes: Secondary | ICD-10-CM | POA: Diagnosis not present

## 2019-09-09 DIAGNOSIS — I472 Ventricular tachycardia: Secondary | ICD-10-CM | POA: Diagnosis not present

## 2019-09-09 DIAGNOSIS — E039 Hypothyroidism, unspecified: Secondary | ICD-10-CM | POA: Diagnosis not present

## 2019-09-09 DIAGNOSIS — I25118 Atherosclerotic heart disease of native coronary artery with other forms of angina pectoris: Secondary | ICD-10-CM

## 2019-09-09 DIAGNOSIS — I480 Paroxysmal atrial fibrillation: Secondary | ICD-10-CM

## 2019-09-09 DIAGNOSIS — Z951 Presence of aortocoronary bypass graft: Secondary | ICD-10-CM

## 2019-09-09 DIAGNOSIS — I10 Essential (primary) hypertension: Secondary | ICD-10-CM | POA: Diagnosis not present

## 2019-09-09 DIAGNOSIS — K219 Gastro-esophageal reflux disease without esophagitis: Secondary | ICD-10-CM | POA: Diagnosis not present

## 2019-09-09 HISTORY — DX: Paroxysmal atrial fibrillation: I48.0

## 2019-09-09 MED ORDER — LOSARTAN POTASSIUM 25 MG PO TABS: 12.5000 mg | ORAL_TABLET | Freq: Every day | ORAL | 0 refills | Status: DC

## 2019-09-09 MED ORDER — METOPROLOL SUCCINATE ER 25 MG PO TB24: 25.0000 mg | ORAL_TABLET | Freq: Every day | ORAL | 0 refills | Status: DC

## 2019-09-09 MED ORDER — APIXABAN 5 MG PO TABS: 5.0000 mg | ORAL_TABLET | Freq: Two times a day (BID) | ORAL | 0 refills | Status: DC

## 2019-09-09 MED ORDER — AMIODARONE HCL 200 MG PO TABS: 200.0000 mg | ORAL_TABLET | Freq: Two times a day (BID) | ORAL | 0 refills | Status: DC

## 2019-09-09 NOTE — Patient Instructions (Addendum)
Medication Instructions:  Your physician has recommended you make the following change in your medication:   CHANGE: Metoprolol tartrate to Metoprolol succinate to 25 mg daily  *If you need a refill on your cardiac medications before your next appointment, please call your pharmacy*   Lab Work: None.  If you have labs (blood work) drawn today and your tests are completely normal, you will receive your results only by: Marland Kitchen MyChart Message (if you have MyChart) OR . A paper copy in the mail If you have any lab test that is abnormal or we need to change your treatment, we will call you to review the results.   Testing/Procedures: None.    Follow-Up: At [facility], you and your health needs are our priority.  As part of our continuing mission to provide you with exceptional heart care, we have created designated Provider Care Teams.  These Care Teams include your primary Cardiologist (physician) and Advanced Practice Providers (APPs -  Physician Assistants and Nurse Practitioners) who all work together to provide you with the care you need, when you need it.  We recommend signing up for the patient portal called "MyChart".  Sign up information is provided on this After Visit Summary.  MyChart is used to connect with patients for Virtual Visits (Telemedicine).  Patients are able to view lab/test results, encounter notes, upcoming appointments, etc.  Non-urgent messages can be sent to your provider as well.   To learn more about what you can do with MyChart, go to [email or web address].    Your next appointment:   1 month(s)  The format for your next appointment:   In Person  Provider:   Jenne Campus, MD   Other Instructions

## 2019-09-09 NOTE — Progress Notes (Signed)
Cardiology Office Note:    Date:  09/09/2019   ID:  Shane Massey, DOB [birth year], MRN [id number]  PCP:  Maryella Shivers, MD  Cardiologist:  Jenne Campus, MD    Referring MD: Maryella Shivers, MD   Chief Complaint  Patient presents with  . Follow-up    1 Month Post Cath   Recovering from surgery  History of Present Illness:    Shane Massey is a 79 y.o. male with past medical history significant for coronary artery disease, in March 2017 he got PTCA and stenting of the mid LAD.  He also got history of ischemic cardiomyopathy with ejection fraction being normal in January 2020.  He was complaining of having fatigue tiredness.  Also monitor showed some no significant tachycardia.  Finally decision has been made after trying multiple medication to pursue cardiac catheterization.  Cardiac catheterization revealed left main coronary artery 60725%. there was also 80% stenosis of superior branch of ramus intermedius.  50% stenosis of first diagonal branch..  Decision has been made to pursue surgical treatment.  Surgery was done on 08/13/2019 with LIMA to LAD, left radial artery to second obtuse marginal branch, RIMA to ramus intermedius.  He also had a PFO closure.  Recovery time was complicated by episodes of atrial fibrillation treated with amiodarone and anticoagulation.  He was also noted to have ejection fraction being diminished however latest echocardiogram before he left the hospital was 45%. Overall he is doing quite well still complain of having some soreness and sensitivity of the chest area but otherwise I consider his recovery is quite good.  He said he is able to walk with his wife and he is already feeling that he has more energy.  Past Medical History:  Diagnosis Date  . CAD (coronary artery disease) 08/12/2019  . Coronary artery disease   . Dizziness 06/14/2017  . Dyslipidemia 09/06/2015  . Dyspnea   . Dysrhythmia   . Exertional dyspnea 09/06/2015  . GERD (gastroesophageal  reflux disease)   . Hypertension   . Hypothyroidism   . Ischemic cardiomyopathy 10/24/2016   Overview:  Ejection fraction 45% in spring 2018  . Myocardial infarction ([place])   . Near syncope   . Nonsustained ventricular tachycardia ([place]) 07/17/2018  . OSA (obstructive sleep apnea)   . OSA on CPAP 11/14/2015   CPAP 13   . Restless leg syndrome 11/30/2016  . 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 CATARACT EXTRACTION  2012  . COLONOSCOPY  2013  . CORONARY ARTERY BYPASS GRAFT N/A 08/13/2019   Procedure: CORONARY ARTERY BYPASS GRAFTING (CABG) TIMES 3.;  Surgeon: Wonda Olds, MD;  Location: [facility];  Service: Open Heart Surgery;  Laterality: N/A;  . CORONARY STENT PLACEMENT    . heart stent Left 09/2015  . HERNIA REPAIR  11/1977  . LEFT HEART CATH AND CORONARY ANGIOGRAPHY N/A 08/06/2019   Procedure: LEFT HEART CATH AND CORONARY ANGIOGRAPHY;  Surgeon: Troy Sine, MD;  Location: [facility];  Service: Cardiovascular;  Laterality: N/A;  . RADIAL ARTERY HARVEST Left 08/13/2019   Procedure: OPEN RADIAL ARTERY HARVEST;  Surgeon: Wonda Olds, MD;  Location: [facility];  Service: Open Heart Surgery;  Laterality: Left;  . REPAIR OF PATENT FORAMEN OVALE N/A 08/13/2019   Procedure: CLOSURE OF PATENT FORAMEN OVALE;  Surgeon: Wonda Olds, MD;  Location: [facility];  Service: Open Heart Surgery;  Laterality: N/A;  . SURGERY FOR  BOWEL ABSCESS  10/1959  . TEE WITHOUT CARDIOVERSION N/A 08/13/2019   Procedure: TRANSESOPHAGEAL ECHOCARDIOGRAM (TEE);  Surgeon: Wonda Olds, MD;  Location: [facility];  Service: Open Heart Surgery;  Laterality: N/A;    Current Medications: Current Meds  Medication Sig  . amiodarone (PACERONE) 200 MG tablet Take 1 tablet (200 mg total) by mouth 2 (two) times daily.  Marland Kitchen apixaban (ELIQUIS) 5 MG TABS tablet Take 1 tablet (5 mg total) by mouth 2 (two) times daily.  Marland Kitchen ascorbic acid (CVS VITAMIN C) 500 MG tablet Take 500 mg by mouth daily  at 12 noon.  Marland Kitchen aspirin EC 81 MG tablet Take 81 mg by mouth daily.  . Calcium Carb-Cholecalciferol (CALCIUM 600+D3 PO) Take 1 tablet by mouth daily at 12 noon.  Marland Kitchen dexlansoprazole (DEXILANT) 60 MG capsule Take 60 mg by mouth daily before breakfast.  . Glycerin-Hypromellose-PEG 400 (DRY EYE RELIEF DROPS) 0.2-0.2-1 % SOLN Place 1 drop into both eyes 3 (three) times daily as needed (dry/irritated eyes.).  Marland Kitchen levothyroxine (SYNTHROID, LEVOTHROID) 50 MCG tablet Take 50 mcg by mouth daily before breakfast.  . losartan (COZAAR) 25 MG tablet Take 0.5 tablets (12.5 mg total) by mouth daily.  . metoprolol tartrate (LOPRESSOR) 25 MG tablet Take 0.5 tablets (12.5 mg total) by mouth 2 (two) times daily.  . Multiple Vitamin (MULTIVITAMIN WITH MINERALS) TABS tablet Take 1 tablet by mouth daily at 12 noon.  . pramipexole (MIRAPEX) 1.5 MG tablet Take 1.5 mg by mouth at bedtime.   . rosuvastatin (CRESTOR) 20 MG tablet Take 1 tablet (20 mg total) by mouth daily. (Patient taking differently: Take 20 mg by mouth every evening. )     Allergies:   Patient has no known allergies.   Social History   Socioeconomic History  . Marital status: Married    Spouse name: Not on file  . Number of children: Not on file  . Years of education: Not on file  . Highest education level: Not on file  Occupational History  . Not on file  Tobacco Use  . Smoking status: Former Smoker    Packs/day: 0.50    Years: 4.00    Pack years: 2.00    Types: Cigarettes    Quit date: 06/11/1958    Years since quitting: 61.2  . Smokeless tobacco: Former Systems developer    Types: [place] date: 12/01/1958  Substance and Sexual Activity  . Alcohol use: No    Alcohol/week: 0.0 standard drinks  . Drug use: No  . Sexual activity: Not on file  Other Topics Concern  . Not on file  Social History Narrative  . Not on file   Social Determinants of Health   Financial Resource Strain:   . Difficulty of Paying Living Expenses:   Food Insecurity:   .  Worried About Charity fundraiser in the Last Year:   . Arboriculturist in the Last Year:   Transportation Needs:   . Film/video editor (Medical):   Marland Kitchen Lack of Transportation (Non-Medical):   Physical Activity:   . Days of Exercise per Week:   . Minutes of Exercise per Session:   Stress:   . Feeling of Stress :   Social Connections:   . Frequency of Communication with Friends and Family:   . Frequency of Social Gatherings with Friends and Family:   . Attends Religious Services:   . Active Member of Clubs or Organizations:   . Attends Archivist  Meetings:   Marland Kitchen Marital Status:      Family History: The patient's family history includes Asthma in his brother and maternal grandmother; Heart attack in his brother; Heart disease in his brother; Stroke in his father. ROS:   Please see the history of present illness.    All 14 point review of systems negative except as described per history of present illness  EKGs/Labs/Other Studies Reviewed:      Recent Labs: 12/10/2018: TSH 2.020 08/12/2019: ALT 21 08/14/2019: Magnesium 2.8 08/17/2019: BUN 13; Creatinine, Ser 0.61; Hemoglobin 9.1; Platelets 180; Potassium 3.8; Sodium 137  Recent Lipid Panel    Component Value Date/Time   CHOL 114 08/13/2019 0603   TRIG 125 08/13/2019 0603   HDL 46 08/13/2019 0603   CHOLHDL 2.5 08/13/2019 0603   VLDL 25 08/13/2019 0603   LDLCALC 43 08/13/2019 0603    Physical Exam:    VS:  BP 110/60   Pulse 64   Ht 5\' 10"  (1.778 m)   Wt 177 lb (80.3 kg)   SpO2 97%   BMI 25.40 kg/m     Wt Readings from Last 3 Encounters:  09/09/19 177 lb (80.3 kg)  08/31/19 177 lb (80.3 kg)  08/19/19 188 lb (85.3 kg)     GEN:  Well nourished, well developed in no acute distress HEENT: Normal NECK: No JVD; No carotid bruits LYMPHATICS: No lymphadenopathy CARDIAC: RRR, no murmurs, no rubs, no gallops RESPIRATORY:  Clear to auscultation without rales, wheezing or rhonchi  ABDOMEN: Soft, non-tender,  non-distended MUSCULOSKELETAL:  No edema; No deformity  SKIN: Warm and dry LOWER EXTREMITIES: no swelling NEUROLOGIC:  Alert and oriented x 3 PSYCHIATRIC:  Normal affect   ASSESSMENT:    1. S/P CABG x 3   2. Coronary artery disease involving native coronary artery of native heart with other form of angina pectoris ([place])   3. Ischemic cardiomyopathy   4. Paroxysmal atrial fibrillation (HCC)   5. Nonsustained ventricular tachycardia (H[facility])    PLAN:    In order of problems listed above:  1. Status post coronary bypass graft.  Will review cardiac catheterization, we reviewed bypass surgery with him he understood exactly why needed to be done and is very happy and satisfied with the care that he got at [facility]  Wound is healing completely.  He still does some fluid it looks like around the left pleura but only very mild.  Denies having shortness of breath.  Chest pain that he is having after his surgery he is getting better.  He does not use any pain medication.  Again asked I consider if his recovery is very good. 2. Ischemic cardiomyopathy he is on small dose of beta-blocker I will switch him to long-acting form of metoprolol succinate.  He is also on losartan very small dose because of low blood pressure will continue.  In the future we will repeat his echocardiogram to see if there is any improvement left ventricle ejection fraction. 3. Paroxysmal atrial fibrillation he is in sinus rhythm today.  We will continue present management.  I recommend continuation of amiodarone 200 g twice daily for another week or 2 and then if no palpitations decreasing the dose to 200 mg daily.  We will continue with anticoagulation. 4. History of nonsustained ventricular tachycardia before revascularization.  In the future he may require to wear monitor to see if there is any recurrences of dyslipidemia.  Of course important factor in terms of stratification of the arrhythmia will be  his ejection  fraction.  Overall he is doing very well.  He is very satisfied with the care he got in [place].   Medication Adjustments/Labs and Tests Ordered: Current medicines are reviewed at length with the patient today.  Concerns regarding medicines are outlined above.  Orders Placed This Encounter  Procedures  . EKG 12-Lead   Medication changes: No orders of the defined types were placed in this encounter.   Signed, Park Liter, MD, [facility] 09/09/2019 3:55 PM    [place]

## 2019-09-14 ENCOUNTER — Ambulatory Visit: Payer: PPO | Admitting: Cardiothoracic Surgery

## 2019-09-14 DIAGNOSIS — I498 Other specified cardiac arrhythmias: Secondary | ICD-10-CM | POA: Diagnosis not present

## 2019-09-14 DIAGNOSIS — Z1339 Encounter for screening examination for other mental health and behavioral disorders: Secondary | ICD-10-CM | POA: Diagnosis not present

## 2019-09-14 DIAGNOSIS — Z951 Presence of aortocoronary bypass graft: Secondary | ICD-10-CM | POA: Diagnosis not present

## 2019-09-14 DIAGNOSIS — Z0000 Encounter for general adult medical examination without abnormal findings: Secondary | ICD-10-CM | POA: Diagnosis not present

## 2019-09-14 DIAGNOSIS — Z7189 Other specified counseling: Secondary | ICD-10-CM | POA: Diagnosis not present

## 2019-09-14 DIAGNOSIS — I2510 Atherosclerotic heart disease of native coronary artery without angina pectoris: Secondary | ICD-10-CM | POA: Diagnosis not present

## 2019-09-14 DIAGNOSIS — Z136 Encounter for screening for cardiovascular disorders: Secondary | ICD-10-CM | POA: Diagnosis not present

## 2019-09-14 DIAGNOSIS — Z139 Encounter for screening, unspecified: Secondary | ICD-10-CM | POA: Diagnosis not present

## 2019-09-14 DIAGNOSIS — Z6828 Body mass index (BMI) 28.0-28.9, adult: Secondary | ICD-10-CM | POA: Diagnosis not present

## 2019-09-14 DIAGNOSIS — Z1331 Encounter for screening for depression: Secondary | ICD-10-CM | POA: Diagnosis not present

## 2019-09-18 ENCOUNTER — Other Ambulatory Visit: Payer: Self-pay | Admitting: Thoracic Surgery (Cardiothoracic Vascular Surgery)

## 2019-09-18 DIAGNOSIS — Z951 Presence of aortocoronary bypass graft: Secondary | ICD-10-CM

## 2019-09-21 ENCOUNTER — Other Ambulatory Visit: Payer: Self-pay

## 2019-09-21 ENCOUNTER — Ambulatory Visit
Admission: RE | Admit: 2019-09-21 | Discharge: 2019-09-21 | Disposition: A | Discharge status: Home / Self Care | Payer: PPO | Source: Ambulatory Visit | Attending: Thoracic Surgery (Cardiothoracic Vascular Surgery) | Admitting: Thoracic Surgery (Cardiothoracic Vascular Surgery)

## 2019-09-21 ENCOUNTER — Ambulatory Visit (INDEPENDENT_AMBULATORY_CARE_PROVIDER_SITE_OTHER): Payer: Self-pay | Admitting: Cardiothoracic Surgery

## 2019-09-21 VITALS — BP 150/62 | HR 61 | Temp 98.4°F | Resp 24 | Ht 70.0 in | Wt 180.0 lb

## 2019-09-21 DIAGNOSIS — Z951 Presence of aortocoronary bypass graft: Secondary | ICD-10-CM

## 2019-09-21 DIAGNOSIS — J9811 Atelectasis: Secondary | ICD-10-CM | POA: Diagnosis not present

## 2019-09-21 MED ORDER — CLOPIDOGREL BISULFATE 75 MG PO TABS: 75.0000 mg | ORAL_TABLET | Freq: Every day | ORAL | 11 refills | Status: AC

## 2019-09-21 NOTE — Progress Notes (Signed)
[address]       [place] [address]             [phone]     CARDIOTHORACIC SURGERY OFFICE NOTE  Referring Provider is Park Liter, MD Primary Cardiologist is Jenne Campus, MD PCP is Maryella Shivers, MD   HPI:  79 yo man now approximately 5 weeks s/p CABG. Doing well. Reports mild SOB with exertion, which is improving. No chest pain. Mild peri-incisional tenderness   Current Outpatient Medications  Medication Sig Dispense Refill  . amiodarone (PACERONE) 200 MG tablet Take 1 tablet (200 mg total) by mouth 2 (two) times daily. 180 tablet 0  . apixaban (ELIQUIS) 5 MG TABS tablet Take 1 tablet (5 mg total) by mouth 2 (two) times daily. 180 tablet 0  . ascorbic acid (CVS VITAMIN C) 500 MG tablet Take 500 mg by mouth daily at 12 noon.    Marland Kitchen aspirin EC 81 MG tablet Take 81 mg by mouth daily.    . Calcium Carb-Cholecalciferol (CALCIUM 600+D3 PO) Take 1 tablet by mouth daily at 12 noon.    Marland Kitchen dexlansoprazole (DEXILANT) 60 MG capsule Take 60 mg by mouth daily before breakfast.    . Glycerin-Hypromellose-PEG 400 (DRY EYE RELIEF DROPS) 0.2-0.2-1 % SOLN Place 1 drop into both eyes 3 (three) times daily as needed (dry/irritated eyes.).    Marland Kitchen levothyroxine (SYNTHROID, LEVOTHROID) 50 MCG tablet Take 50 mcg by mouth daily before breakfast.    . losartan (COZAAR) 25 MG tablet Take 0.5 tablets (12.5 mg total) by mouth daily. 45 tablet 0  . metoprolol succinate (TOPROL-XL) 25 MG 24 hr tablet Take 1 tablet (25 mg total) by mouth daily. Take with or immediately following a meal. 90 tablet 0  . Multiple Vitamin (MULTIVITAMIN WITH MINERALS) TABS tablet Take 1 tablet by mouth daily at 12 noon.    . pramipexole (MIRAPEX) 1.5 MG tablet Take 1.5 mg by mouth at bedtime.     . clopidogrel (PLAVIX) 75 MG tablet Take 1 tablet (75 mg total) by mouth daily. 30 tablet 11  . rosuvastatin (CRESTOR) 20 MG tablet Take 1 tablet (20 mg total) by mouth daily. (Patient taking  differently: Take 20 mg by mouth every evening. ) 90 tablet 1   No current facility-administered medications for this visit.      Physical Exam:   BP (!) 150/62 (BP Location: Right Arm, Patient Position: Sitting, Cuff Size: Normal)   Pulse 61   Temp 98.4 F (36.9 C) (Temporal)   Resp (!) 24   Ht 5\' 10"  (1.778 m)   Wt 81.6 kg   SpO2 98% Comment: RA  BMI 25.83 kg/m   General:  Well-appearing, NAD  Chest:   cta  CV:   rrr  Incisions:  C/d/i  Extremities:  No edema  Diagnostic Tests:  cxr with clear lung fields   Impression:  Doing well after CABG  Plan:  F/u as needed Stop amiodarone and eliquis; resume Plavix 75 mg daily--prescription given  I spent in excess of 20 minutes during the conduct of this office consultation and >50% of this time involved direct face-to-face encounter with the patient for counseling and/or coordination of their care.  Level 2                 10 minutes Level 3                 15 minutes Level 4  25 minutes Level 5                 40 minutes  B. Murvin Natal, MD 09/21/2019 9:48 AM

## 2019-09-29 DIAGNOSIS — K219 Gastro-esophageal reflux disease without esophagitis: Secondary | ICD-10-CM | POA: Diagnosis not present

## 2019-09-29 DIAGNOSIS — Z951 Presence of aortocoronary bypass graft: Secondary | ICD-10-CM | POA: Diagnosis not present

## 2019-09-29 DIAGNOSIS — I10 Essential (primary) hypertension: Secondary | ICD-10-CM | POA: Diagnosis not present

## 2019-09-29 DIAGNOSIS — L570 Actinic keratosis: Secondary | ICD-10-CM | POA: Diagnosis not present

## 2019-09-29 DIAGNOSIS — L821 Other seborrheic keratosis: Secondary | ICD-10-CM | POA: Diagnosis not present

## 2019-09-29 DIAGNOSIS — Z955 Presence of coronary angioplasty implant and graft: Secondary | ICD-10-CM | POA: Diagnosis not present

## 2019-09-29 DIAGNOSIS — L578 Other skin changes due to chronic exposure to nonionizing radiation: Secondary | ICD-10-CM | POA: Diagnosis not present

## 2019-10-08 ENCOUNTER — Telehealth: Payer: Self-pay | Admitting: Cardiology

## 2019-10-08 NOTE — Telephone Encounter (Signed)
New Message   Pts wife is calling and says she needs to assist the pt because hr had open heart surgery and she wants to make sure everything is going okay     Please advise

## 2019-10-09 DIAGNOSIS — E039 Hypothyroidism, unspecified: Secondary | ICD-10-CM | POA: Diagnosis not present

## 2019-10-09 DIAGNOSIS — R7303 Prediabetes: Secondary | ICD-10-CM | POA: Diagnosis not present

## 2019-10-09 DIAGNOSIS — K219 Gastro-esophageal reflux disease without esophagitis: Secondary | ICD-10-CM | POA: Diagnosis not present

## 2019-10-09 DIAGNOSIS — I2510 Atherosclerotic heart disease of native coronary artery without angina pectoris: Secondary | ICD-10-CM | POA: Diagnosis not present

## 2019-10-12 DIAGNOSIS — Z951 Presence of aortocoronary bypass graft: Secondary | ICD-10-CM | POA: Diagnosis not present

## 2019-10-12 NOTE — Telephone Encounter (Signed)
Yes I will be perfectly fine we did, his wife can come with him 40

## 2019-10-13 ENCOUNTER — Other Ambulatory Visit: Payer: Self-pay

## 2019-10-13 ENCOUNTER — Ambulatory Visit: Payer: PPO | Admitting: Cardiology

## 2019-10-13 ENCOUNTER — Encounter: Payer: Self-pay | Admitting: Cardiology

## 2019-10-13 VITALS — BP 144/72 | HR 60 | Ht 70.0 in | Wt 181.0 lb

## 2019-10-13 DIAGNOSIS — Z951 Presence of aortocoronary bypass graft: Secondary | ICD-10-CM | POA: Diagnosis not present

## 2019-10-13 DIAGNOSIS — R0602 Shortness of breath: Secondary | ICD-10-CM | POA: Diagnosis not present

## 2019-10-13 DIAGNOSIS — R0609 Other forms of dyspnea: Secondary | ICD-10-CM

## 2019-10-13 DIAGNOSIS — I472 Ventricular tachycardia: Secondary | ICD-10-CM

## 2019-10-13 DIAGNOSIS — I480 Paroxysmal atrial fibrillation: Secondary | ICD-10-CM

## 2019-10-13 DIAGNOSIS — R0600 Dyspnea, unspecified: Secondary | ICD-10-CM

## 2019-10-13 DIAGNOSIS — I4729 Other ventricular tachycardia: Secondary | ICD-10-CM

## 2019-10-13 MED ORDER — NITROGLYCERIN 0.4 MG SL SUBL: 0.4000 mg | SUBLINGUAL_TABLET | SUBLINGUAL | 3 refills | Status: DC | PRN

## 2019-10-13 NOTE — Patient Instructions (Signed)
Medication Instructions:  Your physician recommends that you continue on your current medications as directed. Please refer to the Current Medication list given to you today.  *If you need a refill on your cardiac medications before your next appointment, please call your pharmacy*   Lab Work: Your physician recommends that you return for lab work today: tsh, cmp, cbc, pro bnp, d dimer  If you have labs (blood work) drawn today and your tests are completely normal, you will receive your results only by: Marland Kitchen MyChart Message (if you have MyChart) OR . A paper copy in the mail If you have any lab test that is abnormal or we need to change your treatment, we will call you to review the results.   Testing/Procedures: A chest x-ray takes a picture of the organs and structures inside the chest, including the heart, lungs, and blood vessels. This test can show several things, including, whether the heart is enlarges; whether fluid is building up in the lungs; and whether pacemaker / defibrillator leads are still in place.  Your physician has requested that you have an echocardiogram. Echocardiography is a painless test that uses sound waves to create images of your heart. It provides your doctor with information about the size and shape of your heart and how well your heart's chambers and valves are working. This procedure takes approximately one hour. There are no restrictions for this procedure.     Follow-Up: At [facility], you and your health needs are our priority.  As part of our continuing mission to provide you with exceptional heart care, we have created designated Provider Care Teams.  These Care Teams include your primary Cardiologist (physician) and Advanced Practice Providers (APPs -  Physician Assistants and Nurse Practitioners) who all work together to provide you with the care you need, when you need it.  We recommend signing up for the patient portal called "MyChart".  Sign up  information is provided on this After Visit Summary.  MyChart is used to connect with patients for Virtual Visits (Telemedicine).  Patients are able to view lab/test results, encounter notes, upcoming appointments, etc.  Non-urgent messages can be sent to your provider as well.   To learn more about what you can do with MyChart, go to [email or web address].    Your next appointment:   3 week(s)  The format for your next appointment:   In Person  Provider:   Jenne Campus, MD   Other Instructions   Echocardiogram An echocardiogram is a procedure that uses painless sound waves (ultrasound) to produce an image of the heart. Images from an echocardiogram can provide important information about:  Signs of coronary artery disease (CAD).  Aneurysm detection. An aneurysm is a weak or damaged part of an artery wall that bulges out from the normal force of blood pumping through the body.  Heart size and shape. Changes in the size or shape of the heart can be associated with certain conditions, including heart failure, aneurysm, and CAD.  Heart muscle function.  Heart valve function.  Signs of a past heart attack.  Fluid buildup around the heart.  Thickening of the heart muscle.  A tumor or infectious growth around the heart valves. Tell a health care provider about:  Any allergies you have.  All medicines you are taking, including vitamins, herbs, eye drops, creams, and over-the-counter medicines.  Any blood disorders you have.  Any surgeries you have had.  Any medical conditions you have.  Whether you are pregnant or  may be pregnant. What are the risks? Generally, this is a safe procedure. However, problems may occur, including:  Allergic reaction to dye (contrast) that may be used during the procedure. What happens before the procedure? No specific preparation is needed. You may eat and drink normally. What happens during the procedure?   An IV tube may be  inserted into one of your veins.  You may receive contrast through this tube. A contrast is an injection that improves the quality of the pictures from your heart.  A gel will be applied to your chest.  A wand-like tool (transducer) will be moved over your chest. The gel will help to transmit the sound waves from the transducer.  The sound waves will harmlessly bounce off of your heart to allow the heart images to be captured in real-time motion. The images will be recorded on a computer. The procedure may vary among health care providers and hospitals. What happens after the procedure?  You may return to your normal, everyday life, including diet, activities, and medicines, unless your health care provider tells you not to do that. Summary  An echocardiogram is a procedure that uses painless sound waves (ultrasound) to produce an image of the heart.  Images from an echocardiogram can provide important information about the size and shape of your heart, heart muscle function, heart valve function, and fluid buildup around your heart.  You do not need to do anything to prepare before this procedure. You may eat and drink normally.  After the echocardiogram is completed, you may return to your normal, everyday life, unless your health care provider tells you not to do that. This information is not intended to replace advice given to you by your health care provider. Make sure you discuss any questions you have with your health care provider. Document Revised: 09/18/2018 Document Reviewed: 06/30/2016 Elsevier Patient Education  2020 [employer].    Chest X-Ray A chest X-ray is a painless test that uses radiation to create images of the structures inside of your chest. Chest X-rays are used to look for many health conditions, including heart failure, pneumonia, tuberculosis, rib fractures, breathing disorders, and cancer. They may be used to diagnose chest pain, constant coughing, or  trouble breathing. Tell a health care provider about:  Any allergies you have.  All medicines you are taking, including vitamins, herbs, eye drops, creams, and over-the-counter medicines.  Any surgeries you have had.  Any medical conditions you have.  Whether you are pregnant or may be pregnant. What are the risks? Getting a chest X-ray is a safe procedure. However, you will be exposed to a small amount of radiation. Being exposed to too much radiation over a lifetime can increase the risk of cancer. This risk is small, but it may occur if you have many X-rays throughout your life. What happens before the procedure?  You may be asked to remove glasses, jewelry, and any other metal objects.  You will be asked to undress from the waist up. You may be given a hospital gown to wear.  You may be asked to wear a protective lead apron to protect parts of your body from radiation. What happens during the procedure?   You will be asked to stand still as each picture is taken to get the best possible images.  You will be asked to take a deep breath and hold your breath for a few seconds.  The X-ray machine will create a picture of your chest using  a tiny burst of radiation. This is painless.  More pictures may be taken from other angles. Typically, one picture will be taken while you face the X-ray camera, and another picture will be taken from the side while you stand. If you cannot stand, you may be asked to lie down. The procedure may vary among health care providers and hospitals. What happens after the procedure?  The X-ray(s) will be reviewed by your health care provider or an X-ray (radiology) specialist.  It is up to you to get your test results. Ask your health care provider, or the department that is doing the test, when your results will be ready.  Your health care provider will tell you if you need more tests or a follow-up exam. Keep all follow-up visits as told by your  health care provider. This is important. Summary  A chest X-ray is a safe, painless test that is used to examine the inside of the chest, heart, and lungs.  You will need to undress from the waist up and remove jewelry and metal objects before the procedure.  You will be exposed to a small amount of radiation during the procedure.  The X-ray machine will take one or more pictures of your chest while you remain as still as possible.  Later, a health care provider or specialist will review the test results with you. This information is not intended to replace advice given to you by your health care provider. Make sure you discuss any questions you have with your health care provider. Document Revised: 09/17/2018 Document Reviewed: 07/24/2016 Elsevier Patient Education  [place].

## 2019-10-13 NOTE — Progress Notes (Signed)
Cardiology Office Note:    Date:  10/13/2019   ID:  Shane Massey, DOB Apr 05, 1941, MRN [id number]  PCP:  Maryella Shivers, MD  Cardiologist:  Jenne Campus, MD    Referring MD: Maryella Shivers, MD   No chief complaint on file. I am short of breath  History of Present Illness:    Shane Massey is a 79 y.o. male   with past medical history significant for coronary artery disease, in March 2017 he got PTCA and stenting of the mid LAD.  He also got history of ischemic cardiomyopathy with ejection fraction being normal in January 2020.  He was complaining of having fatigue tiredness.  Also monitor showed some no significant tachycardia.  Finally decision has been made after trying multiple medication to pursue cardiac catheterization.  Cardiac catheterization revealed left main coronary artery 60-75%. there was also 80% stenosis of superior branch of ramus intermedius.  50% stenosis of first diagonal branch..  Decision has been made to pursue surgical treatment.  Surgery was done on 08/13/2019 with LIMA to LAD, left radial artery to second obtuse marginal branch, RIMA to ramus intermedius.  He also had a PFO closure.  Recovery time was complicated by episodes of atrial fibrillation treated with amiodarone and anticoagulation.  He was also noted to have ejection fraction being diminished however latest echocardiogram before he left the hospital was 45%. He comes to my office today with complaints of being short of breath.  He said after surgery he was easily able to walk outside with no difficulties now he is getting short of breath.  He does not have paroxysmal nocturnal dyspnea.  He did notice some swelling of both legs but only minimal.  There is no chest pain tightness squeezing pressure burning chest.  He has no palpitations.  Past Medical History:  Diagnosis Date  . CAD (coronary artery disease) 08/12/2019  . Coronary artery disease   . Dizziness 06/14/2017  . Dyslipidemia 09/06/2015  .  Dyspnea   . Dysrhythmia   . Exertional dyspnea 09/06/2015  . GERD (gastroesophageal reflux disease)   . Hypertension   . Hypothyroidism   . Ischemic cardiomyopathy 10/24/2016   Overview:  Ejection fraction 45% in spring 2018  . Myocardial infarction ([place])   . Near syncope   . Nonsustained ventricular tachycardia ([place]) 07/17/2018  . OSA (obstructive sleep apnea)   . OSA on CPAP 11/14/2015   CPAP 13   . Restless leg syndrome 11/30/2016  . 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 CATARACT EXTRACTION  2012  . COLONOSCOPY  2013  . CORONARY ARTERY BYPASS GRAFT N/A 08/13/2019   Procedure: CORONARY ARTERY BYPASS GRAFTING (CABG) TIMES 3.;  Surgeon: Wonda Olds, MD;  Location: [facility];  Service: Open Heart Surgery;  Laterality: N/A;  . CORONARY STENT PLACEMENT    . heart stent Left 09/2015  . HERNIA REPAIR  11/1977  . LEFT HEART CATH AND CORONARY ANGIOGRAPHY N/A 08/06/2019   Procedure: LEFT HEART CATH AND CORONARY ANGIOGRAPHY;  Surgeon: Troy Sine, MD;  Location: [facility];  Service: Cardiovascular;  Laterality: N/A;  . RADIAL ARTERY HARVEST Left 08/13/2019   Procedure: OPEN RADIAL ARTERY HARVEST;  Surgeon: Wonda Olds, MD;  Location: [facility];  Service: Open Heart Surgery;  Laterality: Left;  . REPAIR OF PATENT FORAMEN OVALE N/A 08/13/2019   Procedure: CLOSURE OF PATENT FORAMEN OVALE;  Surgeon: Wonda Olds, MD;  Location:  [facility];  Service: Open Heart Surgery;  Laterality: N/A;  . SURGERY FOR BOWEL ABSCESS  10/1959  . TEE WITHOUT CARDIOVERSION N/A 08/13/2019   Procedure: TRANSESOPHAGEAL ECHOCARDIOGRAM (TEE);  Surgeon: Wonda Olds, MD;  Location: [facility];  Service: Open Heart Surgery;  Laterality: N/A;    Current Medications: Current Meds  Medication Sig  . ascorbic acid ([employer] VITAMIN C) 500 MG tablet Take 500 mg by mouth daily at 12 noon.  Marland Kitchen aspirin EC 81 MG tablet Take 81 mg by mouth daily.  . Calcium Carb-Cholecalciferol (CALCIUM  600+D3 PO) Take 1 tablet by mouth daily at 12 noon.  . clopidogrel (PLAVIX) 75 MG tablet Take 1 tablet (75 mg total) by mouth daily.  Marland Kitchen dexlansoprazole (DEXILANT) 60 MG capsule Take 60 mg by mouth daily before breakfast.  . Glycerin-Hypromellose-PEG 400 (DRY EYE RELIEF DROPS) 0.2-0.2-1 % SOLN Place 1 drop into both eyes 3 (three) times daily as needed (dry/irritated eyes.).  Marland Kitchen levothyroxine (SYNTHROID, LEVOTHROID) 50 MCG tablet Take 50 mcg by mouth daily before breakfast.  . losartan (COZAAR) 25 MG tablet Take 0.5 tablets (12.5 mg total) by mouth daily.  . metoprolol succinate (TOPROL-XL) 25 MG 24 hr tablet Take 1 tablet (25 mg total) by mouth daily. Take with or immediately following a meal.  . Multiple Vitamin (MULTIVITAMIN WITH MINERALS) TABS tablet Take 1 tablet by mouth daily at 12 noon.  . nitroGLYCERIN (NITROSTAT) 0.4 MG SL tablet Place 0.4 mg under the tongue every 5 (five) minutes as needed for chest pain.  . pramipexole (MIRAPEX) 1.5 MG tablet Take 1.5 mg by mouth at bedtime.   . rosuvastatin (CRESTOR) 20 MG tablet Take 1 tablet (20 mg total) by mouth daily. (Patient taking differently: Take 20 mg by mouth every evening. )     Allergies:   Patient has no known allergies.   Social History   Socioeconomic History  . Marital status: Married    Spouse name: Not on file  . Number of children: Not on file  . Years of education: Not on file  . Highest education level: Not on file  Occupational History  . Not on file  Tobacco Use  . Smoking status: Former Smoker    Packs/day: 0.50    Years: 4.00    Pack years: 2.00    Types: Cigarettes    Quit date: 06/11/1958    Years since quitting: 61.3  . Smokeless tobacco: Former Systems developer    Types: [place] date: 12/01/1958  Substance and Sexual Activity  . Alcohol use: No    Alcohol/week: 0.0 standard drinks  . Drug use: No  . Sexual activity: Not on file  Other Topics Concern  . Not on file  Social History Narrative  . Not on file    Social Determinants of Health   Financial Resource Strain:   . Difficulty of Paying Living Expenses:   Food Insecurity:   . Worried About Charity fundraiser in the Last Year:   . Arboriculturist in the Last Year:   Transportation Needs:   . Film/video editor (Medical):   Marland Kitchen Lack of Transportation (Non-Medical):   Physical Activity:   . Days of Exercise per Week:   . Minutes of Exercise per Session:   Stress:   . Feeling of Stress :   Social Connections:   . Frequency of Communication with Friends and Family:   . Frequency of Social Gatherings with Friends and Family:   .  Attends Religious Services:   . Active Member of Clubs or Organizations:   . Attends Archivist Meetings:   Marland Kitchen Marital Status:      Family History: The patient's family history includes Asthma in his brother and maternal grandmother; Heart attack in his brother; Heart disease in his brother; Stroke in his father. ROS:   Please see the history of present illness.    All 14 point review of systems negative except as described per history of present illness  EKGs/Labs/Other Studies Reviewed:      Recent Labs: 12/10/2018: TSH 2.020 08/12/2019: ALT 21 08/14/2019: Magnesium 2.8 08/17/2019: BUN 13; Creatinine, Ser 0.61; Hemoglobin 9.1; Platelets 180; Potassium 3.8; Sodium 137  Recent Lipid Panel    Component Value Date/Time   CHOL 114 08/13/2019 0603   TRIG 125 08/13/2019 0603   HDL 46 08/13/2019 0603   CHOLHDL 2.5 08/13/2019 0603   VLDL 25 08/13/2019 0603   LDLCALC 43 08/13/2019 0603    Physical Exam:    VS:  BP (!) 144/72   Pulse 60   Ht 5\' 10"  (1.778 m)   Wt 181 lb (82.1 kg)   SpO2 95%   BMI 25.97 kg/m     Wt Readings from Last 3 Encounters:  10/13/19 181 lb (82.1 kg)  09/21/19 180 lb (81.6 kg)  09/09/19 177 lb (80.3 kg)     GEN:  Well nourished, well developed in no acute distress HEENT: Normal NECK: No JVD; No carotid bruits LYMPHATICS: No lymphadenopathy CARDIAC: RRR, no  murmurs, no rubs, no gallops RESPIRATORY:  Clear to auscultation without rales, wheezing or rhonchi  ABDOMEN: Soft, non-tender, non-distended MUSCULOSKELETAL:  No edema; No deformity  SKIN: Warm and dry LOWER EXTREMITIES: no swelling NEUROLOGIC:  Alert and oriented x 3 PSYCHIATRIC:  Normal affect   ASSESSMENT:    1. S/P CABG x 3   2. Paroxysmal atrial fibrillation (H[facility])   3. Nonsustained ventricular tachycardia ([place])   4. Dyspnea on exertion    PLAN:    In order of problems listed above:  1. Status post coronary artery bypass graft.  Concern is some mild shortness of breath.  My physical exam did not reveal significant pleural effusion but I will ask him to have an echocardiogram done to assess left ventricle ejection fraction, he will also have chest x-ray done to rule out significant pleural effusion.  I will check proBNP as well as Chem-7 CBC and TSH done.  Why he is short of breath.  I will also ask him to have the done. 2. Paroxysmal atrial fibrillation EKG will be done to check on please rhythm.  He sounds regular.  His amiodarone as well as Eliquis has been discontinued since the feeling was that his arrhythmia was only related to bypass surgery. 3. History of nonsustained ventricular tachycardia.  Thinking was that this was related to ischemia.  Now after revascularization probably in the future we will monitor. 4. Dyspnea on exertion: Concerning.  Plan is as outlined above.   Medication Adjustments/Labs and Tests Ordered: Current medicines are reviewed at length with the patient today.  Concerns regarding medicines are outlined above.  No orders of the defined types were placed in this encounter.  Medication changes: No orders of the defined types were placed in this encounter.   Signed, Park Liter, MD, [facility] 10/13/2019 9:26 AM    [place]

## 2019-10-14 LAB — COMPREHENSIVE METABOLIC PANEL
ALT: 12 IU/L (ref 0–44)
AST: 19 IU/L (ref 0–40)
Albumin/Globulin Ratio: 1.5 (ref 1.2–2.2)
Albumin: 4.3 g/dL (ref 3.7–4.7)
Alkaline Phosphatase: 63 IU/L (ref 39–117)
BUN/Creatinine Ratio: 18 (ref 10–24)
BUN: 14 mg/dL (ref 8–27)
Bilirubin Total: 0.3 mg/dL (ref 0.0–1.2)
CO2: 24 mmol/L (ref 20–29)
Calcium: 9.5 mg/dL (ref 8.6–10.2)
Chloride: 103 mmol/L (ref 96–106)
Creatinine, Ser: 0.8 mg/dL (ref 0.76–1.27)
GFR calc Af Amer: 100 mL/min/{1.73_m2} (ref >59)
GFR calc non Af Amer: 86 mL/min/{1.73_m2} (ref >59)
Globulin, Total: 2.9 g/dL (ref 1.5–4.5)
Glucose: 100 mg/dL — ABNORMAL HIGH (ref 65–99)
Potassium: 4.7 mmol/L (ref 3.5–5.2)
Sodium: 137 mmol/L (ref 134–144)
Total Protein: 7.2 g/dL (ref 6.0–8.5)

## 2019-10-14 LAB — CBC
Hematocrit: 34.4 % — ABNORMAL LOW (ref 37.5–51.0)
Hemoglobin: 11.2 g/dL — ABNORMAL LOW (ref 13.0–17.7)
MCH: 29.3 pg (ref 26.6–33.0)
MCHC: 32.6 g/dL (ref 31.5–35.7)
MCV: 90 fL (ref 79–97)
Platelets: 232 10*3/uL (ref 150–450)
RBC: 3.82 x10E6/uL — ABNORMAL LOW (ref 4.14–5.80)
RDW: 13.9 % (ref 11.6–15.4)
WBC: 4.3 10*3/uL (ref 3.4–10.8)

## 2019-10-14 LAB — TSH: TSH: 3.59 u[IU]/mL (ref 0.450–4.500)

## 2019-10-14 LAB — D-DIMER, QUANTITATIVE: D-DIMER: 0.98 mg/L FEU — ABNORMAL HIGH (ref 0.00–0.49)

## 2019-10-14 LAB — PRO B NATRIURETIC PEPTIDE: NT-Pro BNP: 718 pg/mL — ABNORMAL HIGH (ref 0–486)

## 2019-10-15 ENCOUNTER — Telehealth: Payer: Self-pay | Admitting: Emergency Medicine

## 2019-10-15 DIAGNOSIS — R0600 Dyspnea, unspecified: Secondary | ICD-10-CM

## 2019-10-15 DIAGNOSIS — R0602 Shortness of breath: Secondary | ICD-10-CM

## 2019-10-15 DIAGNOSIS — I255 Ischemic cardiomyopathy: Secondary | ICD-10-CM

## 2019-10-15 DIAGNOSIS — R0609 Other forms of dyspnea: Secondary | ICD-10-CM

## 2019-10-15 MED ORDER — POTASSIUM CHLORIDE ER 10 MEQ PO TBCR
10.0000 meq | EXTENDED_RELEASE_TABLET | Freq: Every day | ORAL | 2 refills | Status: DC
Start: 2019-10-15 — End: 2020-01-11

## 2019-10-15 MED ORDER — FUROSEMIDE 40 MG PO TABS
40.0000 mg | ORAL_TABLET | Freq: Every day | ORAL | 2 refills | Status: DC
Start: 2019-10-15 — End: 2019-12-17

## 2019-10-15 NOTE — Telephone Encounter (Signed)
Called patient informed him of his results per Dr. Agustin Cree and recommendations. Patient aware he needs to start lasix 40 mg daily and potassium 10 meq daily he will have labs drawn next week. Patient aware we will be calling him to schedule ct to rule out pulmonary embolism. No further questions.

## 2019-10-19 NOTE — Addendum Note (Signed)
Addended by: Ashok Norris on: 10/19/2019 12:32 PM   Modules accepted: Orders

## 2019-10-21 ENCOUNTER — Telehealth: Payer: Self-pay | Admitting: Cardiology

## 2019-10-21 NOTE — Telephone Encounter (Signed)
Called and spoke to patient's wife per dpr. She advised that since the patient was started on lasix 40 mg last week that at first he was having extra urine output but does not seem to be having extra now. She reports some of his swelling is better. She also informed me that the soreness he has across his chest is still there but he already talked to Dr. Agustin Cree about this at last visit I advised her to have him go to the emergency department if that progressed. Patient will have bmp tomorrow and will have a ct chest to rule out pe next week. Will consult with Dr. Agustin Cree to see if he has further recommendations in the mean time.

## 2019-10-21 NOTE — Telephone Encounter (Signed)
New Message  Patient's wife is calling in to speak with Dr. Marthann Schiller nurse. States that she has a question about the effectiveness of patient's medications. Please give patient/patient's wife a call back.

## 2019-10-22 DIAGNOSIS — I255 Ischemic cardiomyopathy: Secondary | ICD-10-CM | POA: Diagnosis not present

## 2019-10-22 DIAGNOSIS — R0600 Dyspnea, unspecified: Secondary | ICD-10-CM | POA: Diagnosis not present

## 2019-10-22 LAB — BASIC METABOLIC PANEL
BUN/Creatinine Ratio: 12 (ref 10–24)
BUN: 12 mg/dL (ref 8–27)
CO2: 24 mmol/L (ref 20–29)
Calcium: 9.2 mg/dL (ref 8.6–10.2)
Chloride: 99 mmol/L (ref 96–106)
Creatinine, Ser: 0.98 mg/dL (ref 0.76–1.27)
GFR calc Af Amer: 86 mL/min/{1.73_m2} (ref >59)
GFR calc non Af Amer: 74 mL/min/{1.73_m2} (ref >59)
Glucose: 106 mg/dL — ABNORMAL HIGH (ref 65–99)
Potassium: 4.8 mmol/L (ref 3.5–5.2)
Sodium: 137 mmol/L (ref 134–144)

## 2019-10-26 ENCOUNTER — Encounter: Payer: Self-pay | Admitting: Cardiology

## 2019-10-26 DIAGNOSIS — R0602 Shortness of breath: Secondary | ICD-10-CM | POA: Diagnosis not present

## 2019-10-26 DIAGNOSIS — R7989 Other specified abnormal findings of blood chemistry: Secondary | ICD-10-CM | POA: Diagnosis not present

## 2019-10-28 ENCOUNTER — Ambulatory Visit (INDEPENDENT_AMBULATORY_CARE_PROVIDER_SITE_OTHER): Payer: PPO

## 2019-10-28 ENCOUNTER — Other Ambulatory Visit: Payer: Self-pay

## 2019-10-28 DIAGNOSIS — Z951 Presence of aortocoronary bypass graft: Secondary | ICD-10-CM | POA: Diagnosis not present

## 2019-10-28 DIAGNOSIS — I472 Ventricular tachycardia: Secondary | ICD-10-CM

## 2019-10-28 DIAGNOSIS — I480 Paroxysmal atrial fibrillation: Secondary | ICD-10-CM

## 2019-10-28 DIAGNOSIS — R0600 Dyspnea, unspecified: Secondary | ICD-10-CM

## 2019-10-28 NOTE — Progress Notes (Signed)
Complete echocardiogram has been performed.  Jimmy Adeana Grilliot RDCS, RVT 

## 2019-10-29 NOTE — Telephone Encounter (Signed)
Left message for patient to return call.

## 2019-10-29 NOTE — Telephone Encounter (Signed)
Called patient informed him per Dr. Agustin Cree no changes will be made at this time. After Dr. Agustin Cree reviews results from ct we will call him with those. No further questions.

## 2019-10-29 NOTE — Telephone Encounter (Signed)
Patient's wife returning call. 

## 2019-11-04 ENCOUNTER — Other Ambulatory Visit: Payer: Self-pay

## 2019-11-05 ENCOUNTER — Encounter: Payer: Self-pay | Admitting: Cardiology

## 2019-11-05 ENCOUNTER — Other Ambulatory Visit: Payer: Self-pay

## 2019-11-05 ENCOUNTER — Ambulatory Visit: Payer: PPO | Admitting: Cardiology

## 2019-11-05 VITALS — BP 150/62 | HR 70 | Ht 70.0 in | Wt 178.2 lb

## 2019-11-05 DIAGNOSIS — Z951 Presence of aortocoronary bypass graft: Secondary | ICD-10-CM | POA: Diagnosis not present

## 2019-11-05 DIAGNOSIS — I255 Ischemic cardiomyopathy: Secondary | ICD-10-CM

## 2019-11-05 DIAGNOSIS — I472 Ventricular tachycardia: Secondary | ICD-10-CM

## 2019-11-05 DIAGNOSIS — I480 Paroxysmal atrial fibrillation: Secondary | ICD-10-CM

## 2019-11-05 DIAGNOSIS — I4729 Other ventricular tachycardia: Secondary | ICD-10-CM

## 2019-11-05 MED ORDER — RANOLAZINE ER 500 MG PO TB12
500.0000 mg | ORAL_TABLET | Freq: Two times a day (BID) | ORAL | 2 refills | Status: DC
Start: 2019-11-05 — End: 2020-03-08

## 2019-11-05 NOTE — Patient Instructions (Signed)
Medication Instructions:  Your physician has recommended you make the following change in your medication:  START: Ranexa 500 MG TWICE DAILY   *If you need a refill on your cardiac medications before your next appointment, please call your pharmacy*   Lab Work: NONE. If you have labs (blood work) drawn today and your tests are completely normal, you will receive your results only by: Marland Kitchen MyChart Message (if you have MyChart) OR . A paper copy in the mail If you have any lab test that is abnormal or we need to change your treatment, we will call you to review the results.   Testing/Procedures: NONE   Follow-Up: At [facility], you and your health needs are our priority.  As part of our continuing mission to provide you with exceptional heart care, we have created designated Provider Care Teams.  These Care Teams include your primary Cardiologist (physician) and Advanced Practice Providers (APPs -  Physician Assistants and Nurse Practitioners) who all work together to provide you with the care you need, when you need it.  We recommend signing up for the patient portal called "MyChart".  Sign up information is provided on this After Visit Summary.  MyChart is used to connect with patients for Virtual Visits (Telemedicine).  Patients are able to view lab/test results, encounter notes, upcoming appointments, etc.  Non-urgent messages can be sent to your provider as well.   To learn more about what you can do with MyChart, go to [email or web address].    Your next appointment:   3 month(s)  The format for your next appointment:   In Person  Provider:   Jenne Campus, MD   Other Instructions  Ranolazine tablets, extended release What is this medicine? RANOLAZINE (ra NOE la zeen) is a heart medicine. It is used to treat chronic chest pain (angina). This medicine must be taken regularly. It will not relieve an acute episode of chest pain. This medicine may be used for other  purposes; ask your health care provider or pharmacist if you have questions. COMMON BRAND NAME(S): Ranexa What should I tell my health care provider before I take this medicine? They need to know if you have any of these conditions:  heart disease  irregular heartbeat  kidney disease  liver disease  low levels of potassium or magnesium in the blood  an unusual or allergic reaction to ranolazine, other medicines, foods, dyes, or preservatives  pregnant or trying to get pregnant  breast-feeding How should I use this medicine? Take this medicine by mouth with a glass of water. Follow the directions on the prescription label. Do not cut, crush, or chew this medicine. Take with or without food. Do not take this medication with grapefruit juice. Take your doses at regular intervals. Do not take your medicine more often then directed. Talk to your pediatrician regarding the use of this medicine in children. Special care may be needed. Overdosage: If you think you have taken too much of this medicine contact a poison control center or emergency room at once. NOTE: This medicine is only for you. Do not share this medicine with others. What if I miss a dose? If you miss a dose, take it as soon as you can. If it is almost time for your next dose, take only that dose. Do not take double or extra doses. What may interact with this medicine? Do not take this medicine with any of the following medications:  antivirals for HIV or AIDS  cerivastatin  certain  antibiotics like chloramphenicol, clarithromycin, dalfopristin; quinupristin, isoniazid, rifabutin, rifampin, rifapentine  certain medicines used for cancer like imatinib, nilotinib  certain medicines for fungal infections like fluconazole, itraconazole, ketoconazole, posaconazole, voriconazole  certain medicines for irregular heart beat like dronedarone  certain medicines for seizures like carbamazepine, fosphenytoin, oxcarbazepine,  phenobarbital, phenytoin  cisapride  conivaptan  cyclosporine  grapefruit or grapefruit juice  lumacaftor; ivacaftor  nefazodone  pimozide  quinacrine  St John's wort  thioridazine This medicine may also interact with the following medications:  alfuzosin  certain medicines for depression, anxiety, or psychotic disturbances like bupropion, citalopram, fluoxetine, fluphenazine, paroxetine, perphenazine, risperidone, sertraline, trifluoperazine  certain medicines for cholesterol like atorvastatin, lovastatin, simvastatin  certain medicines for stomach problems like octreotide, palonosetron, prochlorperazine  eplerenone  ergot alkaloids like dihydroergotamine, ergonovine, ergotamine, methylergonovine  metformin  nicardipine  other medicines that prolong the QT interval (cause an abnormal heart rhythm) like dofetilide, ziprasidone  sirolimus  tacrolimus This list may not describe all possible interactions. Give your health care provider a list of all the medicines, herbs, non-prescription drugs, or dietary supplements you use. Also tell them if you smoke, drink alcohol, or use illegal drugs. Some items may interact with your medicine. What should I watch for while using this medicine? Visit your doctor for regular check ups. Tell your doctor or healthcare professional if your symptoms do not start to get better or if they get worse. This medicine will not relieve an acute attack of angina or chest pain. This medicine can change your heart rhythm. Your health care provider may check your heart rhythm by ordering an electrocardiogram (ECG) while you are taking this medicine. You may get drowsy or dizzy. Do not drive, use machinery, or do anything that needs mental alertness until you know how this medicine affects you. Do not stand or sit up quickly, especially if you are an older patient. This reduces the risk of dizzy or fainting spells. Alcohol may interfere with the  effect of this medicine. Avoid alcoholic drinks. If you are scheduled for any medical or dental procedure, tell your healthcare provider that you are taking this medicine. This medicine can interact with other medicines used during surgery. What side effects may I notice from receiving this medicine? Side effects that you should report to your doctor or health care professional as soon as possible:  allergic reactions like skin rash, itching or hives, swelling of the face, lips, or tongue  breathing problems  changes in vision  fast, irregular or pounding heartbeat  feeling faint or lightheaded, falls  low or high blood pressure  numbness or tingling feelings  ringing in the ears  tremor or shakiness  slow heartbeat (fewer than 50 beats per minute)  swelling of the legs or feet Side effects that usually do not require medical attention (report to your doctor or health care professional if they continue or are bothersome):  constipation  drowsy  dry mouth  headache  nausea or vomiting  stomach upset This list may not describe all possible side effects. Call your doctor for medical advice about side effects. You may report side effects to FDA at 1-800-FDA-1088. Where should I keep my medicine? Keep out of the reach of children. Store at room temperature between 15 and 30 degrees C (59 and 86 degrees F). Throw away any unused medicine after the expiration date. NOTE: This sheet is a summary. It may not cover all possible information. If you have questions about this medicine, talk to your doctor,  pharmacist, or health care provider.  2020 Elsevier/Gold Standard (2018-05-20 09:18:49)

## 2019-11-05 NOTE — Progress Notes (Signed)
Cardiology Office Note:    Date:  11/05/2019   ID:  Shane Massey, DOB 1941/04/15, MRN [id number]  PCP:  Maryella Shivers, MD  Cardiologist:  Jenne Campus, MD    Referring MD: Maryella Shivers, MD   Chief Complaint  Patient presents with  . Follow-up    3-4 WK FU   Doing better  History of Present Illness:    Shane Massey is a 79 y.o. male with past medical history significant for coronary artery disease, in March 2017 he got PTCA and stenting of the mid LAD. He also got history of ischemic cardiomyopathy with ejection fraction being normal in January 2020. He was complaining of having fatigue tiredness. Also monitor showed some no significant tachycardia. Finally decision has been made after trying multiple medication to pursue cardiac catheterization. Cardiac catheterization revealed left main coronary artery 60-75%. there was also 80% stenosis of superior branch of ramus intermedius. 50% stenosis of first diagonal branch.. Decision has been made to pursue surgical treatment. Surgery was done on 08/13/2019 with LIMA to LAD, left radial artery to second obtuse marginal branch, RIMA to ramus intermedius. He also had a PFO closure. Recovery time was complicated by episodes of atrial fibrillation treated with amiodarone and anticoagulation. He was also noted to have ejection fraction being diminished however latest echocardiogram before he left the hospital was 45%. He comes to my office today with complaints of being short of breath.  He said after surgery he was easily able to walk outside with no difficulties now he is getting short of breath.  He does not have paroxysmal nocturnal dyspnea.  He did notice some swelling of both legs but only minimal.  Last time I seen him I did a lot of testing trying to identified why shortness of breath is still there.  He did have a a lot of blood tests all were fine except D-dimer.  After D-dimer he had CT of his chest done which showed no  evidence of PE, there was mild interstitial thickening which may reflect edema or fibrotic changes.  Overall he said that he is feeling better.  Denies have any chest pain tightness squeezing pressure burning chest described As heavy sensation on the left side of the stress all the time.  Past Medical History:  Diagnosis Date  . CAD (coronary artery disease) 08/12/2019  . Coronary artery disease   . Dizziness 06/14/2017  . Dyslipidemia 09/06/2015  . Dyspnea   . Dysrhythmia   . Exertional dyspnea 09/06/2015  . GERD (gastroesophageal reflux disease)   . Hypertension   . Hypothyroidism   . Ischemic cardiomyopathy 10/24/2016   Overview:  Ejection fraction 45% in spring 2018  . Myocardial infarction ([place])   . Near syncope   . Nonsustained ventricular tachycardia ([place]) 07/17/2018  . OSA (obstructive sleep apnea)   . OSA on CPAP 11/14/2015   CPAP 13   . Restless leg syndrome 11/30/2016  . 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 CATARACT EXTRACTION  2012  . COLONOSCOPY  2013  . CORONARY ARTERY BYPASS GRAFT N/A 08/13/2019   Procedure: CORONARY ARTERY BYPASS GRAFTING (CABG) TIMES 3.;  Surgeon: Wonda Olds, MD;  Location: [place];  Service: Open Heart Surgery;  Laterality: N/A;  . CORONARY STENT PLACEMENT    . heart stent Left 09/2015  . HERNIA REPAIR  11/1977  . LEFT HEART CATH AND CORONARY ANGIOGRAPHY N/A 08/06/2019   Procedure: LEFT HEART  CATH AND CORONARY ANGIOGRAPHY;  Surgeon: Troy Sine, MD;  Location: [place] [facility];  Service: Cardiovascular;  Laterality: N/A;  . RADIAL ARTERY HARVEST Left 08/13/2019   Procedure: OPEN RADIAL ARTERY HARVEST;  Surgeon: Wonda Olds, MD;  Location: [place];  Service: Open Heart Surgery;  Laterality: Left;  . REPAIR OF PATENT FORAMEN OVALE N/A 08/13/2019   Procedure: CLOSURE OF PATENT FORAMEN OVALE;  Surgeon: Wonda Olds, MD;  Location: [place];  Service: Open Heart Surgery;  Laterality: N/A;  . SURGERY FOR  BOWEL ABSCESS  10/1959  . TEE WITHOUT CARDIOVERSION N/A 08/13/2019   Procedure: TRANSESOPHAGEAL ECHOCARDIOGRAM (TEE);  Surgeon: Wonda Olds, MD;  Location: [place];  Service: Open Heart Surgery;  Laterality: N/A;    Current Medications: Current Meds  Medication Sig  . ascorbic acid (CVS VITAMIN C) 500 MG tablet Take 500 mg by mouth daily at 12 noon.  Marland Kitchen aspirin EC 81 MG tablet Take 81 mg by mouth daily.  . Calcium Carb-Cholecalciferol (CALCIUM 600+D3 PO) Take 1 tablet by mouth daily at 12 noon.  . clopidogrel (PLAVIX) 75 MG tablet Take 1 tablet (75 mg total) by mouth daily.  Marland Kitchen dexlansoprazole (DEXILANT) 60 MG capsule Take 60 mg by mouth daily before breakfast.  . furosemide (LASIX) 40 MG tablet Take 1 tablet (40 mg total) by mouth daily.  . Glycerin-Hypromellose-PEG 400 (DRY EYE RELIEF DROPS) 0.2-0.2-1 % SOLN Place 1 drop into both eyes 3 (three) times daily as needed (dry/irritated eyes.).  Marland Kitchen levothyroxine (SYNTHROID) 75 MCG tablet Take 75 mcg by mouth daily.  Marland Kitchen levothyroxine (SYNTHROID, LEVOTHROID) 50 MCG tablet Take 50 mcg by mouth daily before breakfast.  . losartan (COZAAR) 25 MG tablet Take 0.5 tablets (12.5 mg total) by mouth daily.  . metoprolol succinate (TOPROL-XL) 25 MG 24 hr tablet Take 1 tablet (25 mg total) by mouth daily. Take with or immediately following a meal.  . Multiple Vitamin (MULTIVITAMIN WITH MINERALS) TABS tablet Take 1 tablet by mouth daily at 12 noon.  . nitroGLYCERIN (NITROSTAT) 0.4 MG SL tablet Place 1 tablet (0.4 mg total) under the tongue every 5 (five) minutes as needed for chest pain.  . potassium chloride (KLOR-CON) 10 MEQ tablet Take 1 tablet (10 mEq total) by mouth daily.  . pramipexole (MIRAPEX) 1.5 MG tablet Take 1.5 mg by mouth at bedtime.      Allergies:   Patient has no known allergies.   Social History   Socioeconomic History  . Marital status: Married    Spouse name: Not on file  . Number of children: Not on file  . Years of education: Not  on file  . Highest education level: Not on file  Occupational History  . Not on file  Tobacco Use  . Smoking status: Former Smoker    Packs/day: 0.50    Years: 4.00    Pack years: 2.00    Types: Cigarettes    Quit date: 06/11/1958    Years since quitting: 61.4  . Smokeless tobacco: Former Systems developer    Types: [place] date: 12/01/1958  Substance and Sexual Activity  . Alcohol use: No    Alcohol/week: 0.0 standard drinks  . Drug use: No  . Sexual activity: Not on file  Other Topics Concern  . Not on file  Social History Narrative  . Not on file   Social Determinants of Health   Financial Resource Strain:   . Difficulty of Paying Living Expenses:  Food Insecurity:   . Worried About Charity fundraiser in the Last Year:   . Arboriculturist in the Last Year:   Transportation Needs:   . Film/video editor (Medical):   Marland Kitchen Lack of Transportation (Non-Medical):   Physical Activity:   . Days of Exercise per Week:   . Minutes of Exercise per Session:   Stress:   . Feeling of Stress :   Social Connections:   . Frequency of Communication with Friends and Family:   . Frequency of Social Gatherings with Friends and Family:   . Attends Religious Services:   . Active Member of Clubs or Organizations:   . Attends Archivist Meetings:   Marland Kitchen Marital Status:      Family History: The patient's family history includes Asthma in his brother and maternal grandmother; Heart attack in his brother; Heart disease in his brother; Stroke in his father. ROS:   Please see the history of present illness.    All 14 point review of systems negative except as described per history of present illness  EKGs/Labs/Other Studies Reviewed:      Recent Labs: 08/14/2019: Magnesium 2.8 10/13/2019: ALT 12; Hemoglobin 11.2; NT-Pro BNP 718; Platelets 232; TSH 3.590 10/22/2019: BUN 12; Creatinine, Ser 0.98; Potassium 4.8; Sodium 137  Recent Lipid Panel    Component Value Date/Time   CHOL 114  08/13/2019 0603   TRIG 125 08/13/2019 0603   HDL 46 08/13/2019 0603   CHOLHDL 2.5 08/13/2019 0603   VLDL 25 08/13/2019 0603   LDLCALC 43 08/13/2019 0603    Physical Exam:    VS:  BP (!) 150/62   Pulse 70   Ht 5\' 10"  (1.778 m)   Wt 178 lb 3.2 oz (80.8 kg)   SpO2 97%   BMI 25.57 kg/m     Wt Readings from Last 3 Encounters:  11/05/19 178 lb 3.2 oz (80.8 kg)  10/13/19 181 lb (82.1 kg)  09/21/19 180 lb (81.6 kg)     GEN:  Well nourished, well developed in no acute distress HEENT: Normal NECK: No JVD; No carotid bruits LYMPHATICS: No lymphadenopathy CARDIAC: RRR, no murmurs, no rubs, no gallops RESPIRATORY:  Clear to auscultation without rales, wheezing or rhonchi  ABDOMEN: Soft, non-tender, non-distended MUSCULOSKELETAL:  No edema; No deformity  SKIN: Warm and dry LOWER EXTREMITIES: no swelling NEUROLOGIC:  Alert and oriented x 3 PSYCHIATRIC:  Normal affect   ASSESSMENT:    1. S/P CABG x 3   2. Ischemic cardiomyopathy   3. Paroxysmal atrial fibrillation (HCC)   4. Nonsustained ventricular tachycardia (HCC)    PLAN:    In order of problems listed above:  1. Status post coronary bypass graft.  Recovering somewhat difficult recovery with some palpitations and atrial fibrillation now slowly gradually getting better.  Recently quite extensive evaluation being done to see if he had any significant abnormality and likely everything can good.  He complained of having heavy sensation on the left side of his chest I will try to give him ranolazine 5 mg twice daily to see if there is any improvement with that.  If not after 3 to 4 days later if he is not seeing any improvement with ranolazine ask him to stop ranolazine.  Interestingly today when asked him if he is feeling better after surgery he said tremendously.  He said before he was not able to walk to the shop and back without being short of breath now he can do  it easily with no difficulties. 2. Ischemic cardiomyopathy:  Echocardiogram showed preserved left ventricular ejection fraction.  Paroxysmal atrial fibrillation, maintaining sinus rhythm.  Continue present management. History of nonsustained ventricular tachycardia: Denies having any dizziness or passing out. Dyslipidemia: He is on Crestor 20 which is high intensity statin I will continue.  I will ask APN from March showing the LDL of 43 HDL 46. I did review all of her test as well as CTA from the hospital for this visit.   Medication Adjustments/Labs and Tests Ordered: Current medicines are reviewed at length with the patient today.  Concerns regarding medicines are outlined above.  No orders of the defined types were placed in this encounter.  Medication changes:  Meds ordered this encounter  Medications  . ranolazine (RANEXA) 500 MG 12 hr tablet    Sig: Take 1 tablet (500 mg total) by mouth 2 (two) times daily.    Dispense:  60 tablet    Refill:  2    Signed, Park Liter, MD, [facility] 11/05/2019 11:41 AM    [place]

## 2019-11-06 DIAGNOSIS — L570 Actinic keratosis: Secondary | ICD-10-CM | POA: Diagnosis not present

## 2019-11-06 DIAGNOSIS — C44629 Squamous cell carcinoma of skin of left upper limb, including shoulder: Secondary | ICD-10-CM | POA: Diagnosis not present

## 2019-11-06 DIAGNOSIS — L578 Other skin changes due to chronic exposure to nonionizing radiation: Secondary | ICD-10-CM | POA: Diagnosis not present

## 2019-11-09 DIAGNOSIS — I2510 Atherosclerotic heart disease of native coronary artery without angina pectoris: Secondary | ICD-10-CM | POA: Diagnosis not present

## 2019-11-09 DIAGNOSIS — R7303 Prediabetes: Secondary | ICD-10-CM | POA: Diagnosis not present

## 2019-11-09 DIAGNOSIS — K219 Gastro-esophageal reflux disease without esophagitis: Secondary | ICD-10-CM | POA: Diagnosis not present

## 2019-11-09 DIAGNOSIS — E039 Hypothyroidism, unspecified: Secondary | ICD-10-CM | POA: Diagnosis not present

## 2019-11-10 DIAGNOSIS — Z951 Presence of aortocoronary bypass graft: Secondary | ICD-10-CM | POA: Diagnosis not present

## 2019-11-23 DIAGNOSIS — M47816 Spondylosis without myelopathy or radiculopathy, lumbar region: Secondary | ICD-10-CM | POA: Diagnosis not present

## 2019-11-23 DIAGNOSIS — G8929 Other chronic pain: Secondary | ICD-10-CM | POA: Diagnosis not present

## 2019-11-23 DIAGNOSIS — M549 Dorsalgia, unspecified: Secondary | ICD-10-CM | POA: Diagnosis not present

## 2019-11-23 DIAGNOSIS — Z6821 Body mass index (BMI) 21.0-21.9, adult: Secondary | ICD-10-CM | POA: Diagnosis not present

## 2019-12-01 DIAGNOSIS — C44629 Squamous cell carcinoma of skin of left upper limb, including shoulder: Secondary | ICD-10-CM | POA: Diagnosis not present

## 2019-12-07 ENCOUNTER — Other Ambulatory Visit: Payer: Self-pay | Admitting: Cardiology

## 2019-12-09 DIAGNOSIS — K219 Gastro-esophageal reflux disease without esophagitis: Secondary | ICD-10-CM | POA: Diagnosis not present

## 2019-12-09 DIAGNOSIS — I2510 Atherosclerotic heart disease of native coronary artery without angina pectoris: Secondary | ICD-10-CM | POA: Diagnosis not present

## 2019-12-09 DIAGNOSIS — E039 Hypothyroidism, unspecified: Secondary | ICD-10-CM | POA: Diagnosis not present

## 2019-12-09 DIAGNOSIS — M47816 Spondylosis without myelopathy or radiculopathy, lumbar region: Secondary | ICD-10-CM | POA: Diagnosis not present

## 2019-12-11 DIAGNOSIS — Z955 Presence of coronary angioplasty implant and graft: Secondary | ICD-10-CM | POA: Diagnosis not present

## 2019-12-17 ENCOUNTER — Other Ambulatory Visit: Payer: Self-pay | Admitting: Cardiology

## 2019-12-21 DIAGNOSIS — R400 Somnolence: Secondary | ICD-10-CM | POA: Diagnosis not present

## 2019-12-21 DIAGNOSIS — G8929 Other chronic pain: Secondary | ICD-10-CM | POA: Diagnosis not present

## 2019-12-21 DIAGNOSIS — Z6827 Body mass index (BMI) 27.0-27.9, adult: Secondary | ICD-10-CM | POA: Diagnosis not present

## 2019-12-21 DIAGNOSIS — M549 Dorsalgia, unspecified: Secondary | ICD-10-CM | POA: Diagnosis not present

## 2019-12-22 DIAGNOSIS — L570 Actinic keratosis: Secondary | ICD-10-CM | POA: Diagnosis not present

## 2020-01-09 ENCOUNTER — Other Ambulatory Visit: Payer: Self-pay | Admitting: Cardiology

## 2020-01-10 DIAGNOSIS — K219 Gastro-esophageal reflux disease without esophagitis: Secondary | ICD-10-CM | POA: Diagnosis not present

## 2020-01-10 DIAGNOSIS — M47816 Spondylosis without myelopathy or radiculopathy, lumbar region: Secondary | ICD-10-CM | POA: Diagnosis not present

## 2020-01-10 DIAGNOSIS — I2510 Atherosclerotic heart disease of native coronary artery without angina pectoris: Secondary | ICD-10-CM | POA: Diagnosis not present

## 2020-01-10 DIAGNOSIS — E039 Hypothyroidism, unspecified: Secondary | ICD-10-CM | POA: Diagnosis not present

## 2020-01-20 ENCOUNTER — Encounter: Payer: Self-pay | Admitting: Cardiology

## 2020-01-20 ENCOUNTER — Ambulatory Visit: Payer: PPO | Admitting: Cardiology

## 2020-01-20 ENCOUNTER — Other Ambulatory Visit: Payer: Self-pay

## 2020-01-20 VITALS — BP 120/60 | HR 72 | Ht 70.0 in | Wt 184.0 lb

## 2020-01-20 DIAGNOSIS — Z951 Presence of aortocoronary bypass graft: Secondary | ICD-10-CM | POA: Diagnosis not present

## 2020-01-20 DIAGNOSIS — E785 Hyperlipidemia, unspecified: Secondary | ICD-10-CM | POA: Diagnosis not present

## 2020-01-20 DIAGNOSIS — R0600 Dyspnea, unspecified: Secondary | ICD-10-CM | POA: Diagnosis not present

## 2020-01-20 DIAGNOSIS — G4733 Obstructive sleep apnea (adult) (pediatric): Secondary | ICD-10-CM | POA: Insufficient documentation

## 2020-01-20 DIAGNOSIS — I10 Essential (primary) hypertension: Secondary | ICD-10-CM | POA: Insufficient documentation

## 2020-01-20 DIAGNOSIS — I499 Cardiac arrhythmia, unspecified: Secondary | ICD-10-CM | POA: Insufficient documentation

## 2020-01-20 DIAGNOSIS — R0609 Other forms of dyspnea: Secondary | ICD-10-CM

## 2020-01-20 DIAGNOSIS — I219 Acute myocardial infarction, unspecified: Secondary | ICD-10-CM | POA: Insufficient documentation

## 2020-01-20 DIAGNOSIS — I480 Paroxysmal atrial fibrillation: Secondary | ICD-10-CM

## 2020-01-20 DIAGNOSIS — I255 Ischemic cardiomyopathy: Secondary | ICD-10-CM | POA: Diagnosis not present

## 2020-01-20 DIAGNOSIS — K219 Gastro-esophageal reflux disease without esophagitis: Secondary | ICD-10-CM | POA: Insufficient documentation

## 2020-01-20 DIAGNOSIS — E039 Hypothyroidism, unspecified: Secondary | ICD-10-CM | POA: Insufficient documentation

## 2020-01-20 MED ORDER — FUROSEMIDE 40 MG PO TABS: ORAL_TABLET | ORAL | 1 refills | Status: DC

## 2020-01-20 NOTE — Progress Notes (Signed)
Cardiology Office Note:    Date:  01/20/2020   ID:  Shane Massey, DOB [birth year], MRN [id number]  PCP:  Maryella Shivers, MD  Cardiologist:  Jenne Campus, MD    Referring MD: Maryella Shivers, MD   Chief Complaint  Patient presents with  . Follow-up    c/o SHOB and  BP elevated when taken at home / pt has home readings for review    History of Present Illness:    Shane Massey is a 79 y.o. male  with past medical history significant for coronary artery disease, in March 2017 he got PTCA and stenting of the mid LAD. He also got history of ischemic cardiomyopathy with ejection fraction being normal in January 2020. He was complaining of having fatigue tiredness. Also monitor showed some no significant tachycardia. Finally decision has been made after trying multiple medication to pursue cardiac catheterization. Cardiac catheterization revealed left main coronary artery 60-75%. there was also 80% stenosis of superior branch of ramus intermedius. 50% stenosis of first diagonal branch.. Decision has been made to pursue surgical treatment. Surgery was done on 08/13/2019 with LIMA to LAD, left radial artery to second obtuse marginal branch, RIMA to ramus intermedius. He also had a PFO closure. Recovery time was complicated by episodes of atrial fibrillation treated with amiodarone and anticoagulation. He was also noted to have ejection fraction being diminished however latest echocardiogram before he left the hospital was 45%. He comes to my office today with complaints of being short of breath. He said after surgery he was easily able to walk outside with no difficulties now he is getting short of breath. He does not have paroxysmal nocturnal dyspnea. He did notice some swelling of both legs but only minimal.  Last time I seen him I did a lot of testing trying to identified why shortness of breath is still there.  He did have a a lot of blood tests all were fine except D-dimer.   After D-dimer he had CT of his chest done which showed no evidence of PE, there was mild interstitial thickening which may reflect edema or fibrotic changes. He still complaining of having some shortness of breath.  And actually is worse than before.  He said that he cannot do much without being short of breath.  Past Medical History:  Diagnosis Date  . CAD (coronary artery disease) 08/12/2019  . Coronary artery disease   . Dizziness 06/14/2017  . Dyslipidemia 09/06/2015  . Dyspnea   . Dysrhythmia   . Exertional dyspnea 09/06/2015  . GERD (gastroesophageal reflux disease)   . Hypertension   . Hypothyroidism   . Ischemic cardiomyopathy 10/24/2016   Overview:  Ejection fraction 45% in spring 2018  . Myocardial infarction ([place])   . Near syncope   . Nonsustained ventricular tachycardia ([place]) 07/17/2018  . OSA (obstructive sleep apnea)   . OSA on CPAP 11/14/2015   CPAP 13   . Restless leg syndrome 11/30/2016  . 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 CATARACT EXTRACTION  2012  . COLONOSCOPY  2013  . CORONARY ARTERY BYPASS GRAFT N/A 08/13/2019   Procedure: CORONARY ARTERY BYPASS GRAFTING (CABG) TIMES 3.;  Surgeon: Wonda Olds, MD;  Location: [place];  Service: Open Heart Surgery;  Laterality: N/A;  . CORONARY STENT PLACEMENT    . heart stent Left 09/2015  . HERNIA REPAIR  11/1977  . LEFT HEART CATH AND CORONARY ANGIOGRAPHY N/A 08/06/2019  Procedure: LEFT HEART CATH AND CORONARY ANGIOGRAPHY;  Surgeon: Troy Sine, MD;  Location: [place] [facility];  Service: Cardiovascular;  Laterality: N/A;  . RADIAL ARTERY HARVEST Left 08/13/2019   Procedure: OPEN RADIAL ARTERY HARVEST;  Surgeon: Wonda Olds, MD;  Location: [place];  Service: Open Heart Surgery;  Laterality: Left;  . REPAIR OF PATENT FORAMEN OVALE N/A 08/13/2019   Procedure: CLOSURE OF PATENT FORAMEN OVALE;  Surgeon: Wonda Olds, MD;  Location: [place];  Service: Open Heart Surgery;  Laterality:  N/A;  . SURGERY FOR BOWEL ABSCESS  10/1959  . TEE WITHOUT CARDIOVERSION N/A 08/13/2019   Procedure: TRANSESOPHAGEAL ECHOCARDIOGRAM (TEE);  Surgeon: Wonda Olds, MD;  Location: [place];  Service: Open Heart Surgery;  Laterality: N/A;    Current Medications: Current Meds  Medication Sig  . ascorbic acid (CVS VITAMIN C) 500 MG tablet Take 500 mg by mouth daily at 12 noon.  Marland Kitchen aspirin EC 81 MG tablet Take 81 mg by mouth daily.  . Calcium Carb-Cholecalciferol (CALCIUM 600+D3 PO) Take 1 tablet by mouth daily at 12 noon.  . clopidogrel (PLAVIX) 75 MG tablet Take 1 tablet (75 mg total) by mouth daily.  Marland Kitchen dexlansoprazole (DEXILANT) 60 MG capsule Take 60 mg by mouth daily before breakfast.  . furosemide (LASIX) 40 MG tablet Take 1 tablet by mouth once daily  . Glycerin-Hypromellose-PEG 400 (DRY EYE RELIEF DROPS) 0.2-0.2-1 % SOLN Place 1 drop into both eyes 3 (three) times daily as needed (dry/irritated eyes.).  Marland Kitchen levothyroxine (SYNTHROID) 75 MCG tablet Take 75 mcg by mouth daily.  Marland Kitchen levothyroxine (SYNTHROID, LEVOTHROID) 50 MCG tablet Take 50 mcg by mouth daily before breakfast.  . losartan (COZAAR) 25 MG tablet Take 1/2 (one-half) tablet by mouth once daily  . metoprolol succinate (TOPROL-XL) 25 MG 24 hr tablet TAKE 1 TABLET BY MOUTH ONCE DAILY TAKE  WITH  OR  IMMEDIATELY  FOLLOWING  A  MEAL.  . Multiple Vitamin (MULTIVITAMIN WITH MINERALS) TABS tablet Take 1 tablet by mouth daily at 12 noon.  . nitroGLYCERIN (NITROSTAT) 0.4 MG SL tablet Place 1 tablet (0.4 mg total) under the tongue every 5 (five) minutes as needed for chest pain.  . potassium chloride (KLOR-CON) 10 MEQ tablet Take 1 tablet by mouth once daily  . pramipexole (MIRAPEX) 1.5 MG tablet Take 1.5 mg by mouth at bedtime.   . ranolazine (RANEXA) 500 MG 12 hr tablet Take 1 tablet (500 mg total) by mouth 2 (two) times daily.  . rosuvastatin (CRESTOR) 20 MG tablet Take 1 tablet by mouth once daily     Allergies:   Patient has no known  allergies.   Social History   Socioeconomic History  . Marital status: Married    Spouse name: Not on file  . Number of children: Not on file  . Years of education: Not on file  . Highest education level: Not on file  Occupational History  . Not on file  Tobacco Use  . Smoking status: Former Smoker    Packs/day: 0.50    Years: 4.00    Pack years: 2.00    Types: Cigarettes    Quit date: 06/11/1958    Years since quitting: 61.6  . Smokeless tobacco: Former Systems developer    Types: Chew    Quit date: 12/01/1958  Vaping Use  . Vaping Use: Never used  Substance and Sexual Activity  . Alcohol use: No    Alcohol/week: 0.0 standard drinks  . Drug use: No  .  Sexual activity: Not on file  Other Topics Concern  . Not on file  Social History Narrative  . Not on file   Social Determinants of Health   Financial Resource Strain:   . Difficulty of Paying Living Expenses:   Food Insecurity:   . Worried About Charity fundraiser in the Last Year:   . Arboriculturist in the Last Year:   Transportation Needs:   . Film/video editor (Medical):   Marland Kitchen Lack of Transportation (Non-Medical):   Physical Activity:   . Days of Exercise per Week:   . Minutes of Exercise per Session:   Stress:   . Feeling of Stress :   Social Connections:   . Frequency of Communication with Friends and Family:   . Frequency of Social Gatherings with Friends and Family:   . Attends Religious Services:   . Active Member of Clubs or Organizations:   . Attends Archivist Meetings:   Marland Kitchen Marital Status:      Family History: The patient's family history includes Asthma in his brother and maternal grandmother; Heart attack in his brother; Heart disease in his brother; Stroke in his father. ROS:   Please see the history of present illness.    All 14 point review of systems negative except as described per history of present illness  EKGs/Labs/Other Studies Reviewed:      Recent Labs: 08/14/2019: Magnesium  2.8 10/13/2019: ALT 12; Hemoglobin 11.2; NT-Pro BNP 718; Platelets 232; TSH 3.590 10/22/2019: BUN 12; Creatinine, Ser 0.98; Potassium 4.8; Sodium 137  Recent Lipid Panel    Component Value Date/Time   CHOL 114 08/13/2019 0603   TRIG 125 08/13/2019 0603   HDL 46 08/13/2019 0603   CHOLHDL 2.5 08/13/2019 0603   VLDL 25 08/13/2019 0603   LDLCALC 43 08/13/2019 0603    Physical Exam:    VS:  BP 120/60   Pulse 72   Ht 5\' 10"  (1.778 m)   Wt 184 lb (83.5 kg)   SpO2 93%   BMI 26.40 kg/m     Wt Readings from Last 3 Encounters:  01/20/20 184 lb (83.5 kg)  11/05/19 178 lb 3.2 oz (80.8 kg)  10/13/19 181 lb (82.1 kg)     GEN:  Well nourished, well developed in no acute distress HEENT: Normal NECK: No JVD; No carotid bruits LYMPHATICS: No lymphadenopathy CARDIAC: RRR, no murmurs, no rubs, no gallops RESPIRATORY:  Clear to auscultation without rales, wheezing or rhonchi  ABDOMEN: Soft, non-tender, non-distended MUSCULOSKELETAL:  No edema; No deformity  SKIN: Warm and dry LOWER EXTREMITIES: no swelling NEUROLOGIC:  Alert and oriented x 3 PSYCHIATRIC:  Normal affect   ASSESSMENT:    1. Dyspnea on exertion   2. Paroxysmal atrial fibrillation (HCC)   3. Ischemic cardiomyopathy   4. Dyslipidemia   5. S/P CABG x 3    PLAN:    In order of problems listed above:  1. Dyspnea on exertion which is still ongoing, echocardiogram reviewed showed preserved left ventricle ejection fraction.  His transmitral flow pattern was characteristic for relaxation of normality which indicate normal pulmonary wedge pressure at that time.  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 2. Paroxysmal atrial fibrillation.  Denies having any.  He does take beta-blocker which I will continue.  Not anticoagulated since episode of atrial fibrillation was only after  surgery. 3. History of ischemic cardiomyopathy but normalization of left ventricle ejection fraction. 4. Dyslipidemia: He is taking Crestor 20 which I will continue. 5. Supples coronary bypass graft wound healed completely   Medication Adjustments/Labs and Tests Ordered: Current medicines are reviewed at length with the patient today.  Concerns regarding medicines are outlined above.  No orders of the defined types were placed in this encounter.  Medication changes: No orders of the defined types were placed in this encounter.   Signed, Park Liter, MD, [facility] 01/20/2020 4:25 PM    [place]

## 2020-01-20 NOTE — Addendum Note (Signed)
Addended by: Ashok Norris on: 01/20/2020 04:34 PM   Modules accepted: Orders

## 2020-01-20 NOTE — Patient Instructions (Signed)
Medication Instructions:  Your physician has recommended you make the following change in your medication:   INCREASE: Lasix to 60 mg daily   *If you need a refill on your cardiac medications before your next appointment, please call your pharmacy*   Lab Work: Your physician recommends that you return for lab work in 1 week: bmp  If you have labs (blood work) drawn today and your tests are completely normal, you will receive your results only by: Marland Kitchen MyChart Message (if you have MyChart) OR . A paper copy in the mail If you have any lab test that is abnormal or we need to change your treatment, we will call you to review the results.   Testing/Procedures: None.    Follow-Up: At [facility], you and your health needs are our priority.  As part of our continuing mission to provide you with exceptional heart care, we have created designated Provider Care Teams.  These Care Teams include your primary Cardiologist (physician) and Advanced Practice Providers (APPs -  Physician Assistants and Nurse Practitioners) who all work together to provide you with the care you need, when you need it.  We recommend signing up for the patient portal called "MyChart".  Sign up information is provided on this After Visit Summary.  MyChart is used to connect with patients for Virtual Visits (Telemedicine).  Patients are able to view lab/test results, encounter notes, upcoming appointments, etc.  Non-urgent messages can be sent to your provider as well.   To learn more about what you can do with MyChart, go to [email or web address].    Your next appointment:   2 month(s)  The format for your next appointment:   In Person  Provider:   Jenne Campus, MD   Other Instructions  Dr. Agustin Cree has referred you to see pulmonology. They should call you within 1 week if not please call our office.

## 2020-02-01 DIAGNOSIS — G4733 Obstructive sleep apnea (adult) (pediatric): Secondary | ICD-10-CM | POA: Diagnosis not present

## 2020-02-01 DIAGNOSIS — Z6828 Body mass index (BMI) 28.0-28.9, adult: Secondary | ICD-10-CM | POA: Diagnosis not present

## 2020-02-08 ENCOUNTER — Telehealth: Payer: Self-pay | Admitting: Cardiology

## 2020-02-08 NOTE — Telephone Encounter (Signed)
Should be fine for him to go back to [facility], he need to gradually slowly build up to stamina. I would not push too much in terms of weight lifting I would rather concentrate on aerobic exercise

## 2020-02-08 NOTE — Telephone Encounter (Addendum)
Patient's wife states the patient would like to start going to the [facility]. She states they will need a letter faxed of what he can and cannot do. Their phone number is [phone].

## 2020-02-09 ENCOUNTER — Encounter: Payer: Self-pay | Admitting: Emergency Medicine

## 2020-02-09 DIAGNOSIS — I2510 Atherosclerotic heart disease of native coronary artery without angina pectoris: Secondary | ICD-10-CM | POA: Diagnosis not present

## 2020-02-09 DIAGNOSIS — K219 Gastro-esophageal reflux disease without esophagitis: Secondary | ICD-10-CM | POA: Diagnosis not present

## 2020-02-09 DIAGNOSIS — E039 Hypothyroidism, unspecified: Secondary | ICD-10-CM | POA: Diagnosis not present

## 2020-02-09 DIAGNOSIS — R7303 Prediabetes: Secondary | ICD-10-CM | POA: Diagnosis not present

## 2020-02-09 NOTE — Telephone Encounter (Signed)
Called patient spoke to him and informed him that his letter is ready for him at [place] office he verbally understood. No further questions.

## 2020-02-09 NOTE — Telephone Encounter (Signed)
Letter has been made once Dr. Agustin Cree signs will inform patient.

## 2020-02-16 ENCOUNTER — Ambulatory Visit: Payer: PPO | Admitting: Cardiology

## 2020-03-02 ENCOUNTER — Institutional Professional Consult (permissible substitution): Payer: PPO | Admitting: Pulmonary Disease

## 2020-03-08 ENCOUNTER — Ambulatory Visit: Payer: PPO | Admitting: Pulmonary Disease

## 2020-03-08 ENCOUNTER — Encounter: Payer: Self-pay | Admitting: Pulmonary Disease

## 2020-03-08 ENCOUNTER — Other Ambulatory Visit: Payer: Self-pay

## 2020-03-08 VITALS — BP 120/60 | HR 61 | Temp 96.7°F | Ht 70.0 in | Wt 184.8 lb

## 2020-03-08 DIAGNOSIS — R0600 Dyspnea, unspecified: Secondary | ICD-10-CM | POA: Diagnosis not present

## 2020-03-08 DIAGNOSIS — R0609 Other forms of dyspnea: Secondary | ICD-10-CM

## 2020-03-08 NOTE — Progress Notes (Signed)
Patient ID: Shane Massey, male    DOB: [birth year], 79 y.o.   MRN: [id number]  Chief Complaint  Patient presents with   Consult    referred by Dr Agustin Cree, DOE, was told he may havie fluid on lungs    Referring provider: Maryella Shivers, MD  HPI:   Shane Massey is a 79 y.o. man with PMH of CAD s/p CABGx3 08/13/2019 whom we are seeing at the request of Dr. Agustin Cree for evaluation of [place]. Notes from referring provider reviewed.   Patient believes his breathing has been off for 3-4 months.  He denies any breathing issues prior to CABG in March 2021.  In the weeks after surgery he had his wife state that they were gradually increasing exercise without significant breathing difficulty.  In a couple weeks after he started developing shortness of breath.  What a cure after walking a few 100 feet on their walks.  He was stop and rest.  After that he would be able to push on.  This continues to happen.  He starts walking and becomes significantly dyspneic.  He waits for a few minutes and then continues as well.  Walks three quarters of a mile to mile after that without much difficulty.  He denies any issue with working in the yard for example using his chainsaw.  No dyspnea with that type of work.  His cardiologist is continue to work this up.  CT scan 10/2019 showed on my interpretation mild interlobular septal thickening otherwise clear lungs.  However it does on my interpretation demonstrate scattered mosaicism suggestive of gas trapping.  He has been on a higher dose of Lasix for the last 6 weeks or so.  He cannot tell any improvement in his breathing.  He denies any orthopnea or PND.  No chest pain with exertion.  He denies seasonal allergies.  He was told he had "asthma" in the past but does not believe this is true.  He is never been on inhalers.  He was seeing a pulmonologist in [name] some years ago for restless leg.  PFTs reviewed and interpreted at that time is no fixed obstruction, no  bronchodilator response, spirometry suggestive of moderate restriction with lung volumes consistent with mild restriction, moderate reduced DLCO.  Notably, RV TLC ratio mildly increased at 113%.  It seems he has OSA based on prior studies in the EMR.  Wife shows me recent results of overnight oximetry with oxygen desaturation.  PCP and plan to order formal sleep/CPAP titration study but this is not been done yet.  Encouraged them to do so.  Previously seen by Dr. Elsworth Soho at [facility] over 3 years ago for OSA and restless leg.  Notes were reviewed.  PMH: CAD Surgical history: CABG three-vessel March 2021 Family history: Positive for asthma, coronary disease, and cancer Social history: Lives in [place], approximate 2-pack-year smoking history quit when he was 79 years of age 73 / Pulmonary Flowsheets:   ACT:  No flowsheet data found.  MMRC: mMRC Dyspnea Scale mMRC Score  03/08/2020 1    Epworth:  No flowsheet data found.  Tests:   FENO:  No results found for: NITRICOXIDE  PFT: No flowsheet data found.  WALK:  No flowsheet data found.  Imaging: Reviewed as per EMR and discussion in this note  Lab Results: Personally reviewed, postop anemia gradually improving as of May 2021 CBC    Component Value Date/Time   WBC 4.3 10/13/2019 1004   WBC 7.6 08/17/2019  0239   RBC 3.82 (L) 10/13/2019 1004   RBC 2.93 (L) 08/17/2019 0239   HGB 11.2 (L) 10/13/2019 1004   HCT 34.4 (L) 10/13/2019 1004   PLT 232 10/13/2019 1004   MCV 90 10/13/2019 1004   MCH 29.3 10/13/2019 1004   MCH 31.1 08/17/2019 0239   MCHC 32.6 10/13/2019 1004   MCHC 33.3 08/17/2019 0239   RDW 13.9 10/13/2019 1004    BMET    Component Value Date/Time   NA 137 10/22/2019 0943   K 4.8 10/22/2019 0943   CL 99 10/22/2019 0943   CO2 24 10/22/2019 0943   GLUCOSE 106 (H) 10/22/2019 0943   GLUCOSE 124 (H) 08/17/2019 0239   BUN 12 10/22/2019 0943   CREATININE 0.98 10/22/2019 0943   CALCIUM 9.2  10/22/2019 0943   GFRNONAA 74 10/22/2019 0943   GFRAA 86 10/22/2019 0943    BNP No results found for: BNP  ProBNP    Component Value Date/Time   PROBNP 718 (H) 10/13/2019 1004    Specialty Problems      Pulmonary Problems   Dyspnea on exertion   Exertional dyspnea   OSA on CPAP    CPAP 13      Dyspnea   OSA (obstructive sleep apnea)      No Known Allergies  Immunization History  Administered Date(s) Administered   Influenza Whole 04/01/2016   Moderna SARS-COVID-2 Vaccination 07/16/2019, 08/28/2019   Zoster Recombinat (Shingrix) 04/15/2019    Past Medical History:  Diagnosis Date   CAD (coronary artery disease) 08/12/2019   Coronary artery disease    Dizziness 06/14/2017   Dyslipidemia 09/06/2015   Dyspnea    Dysrhythmia    Exertional dyspnea 09/06/2015   GERD (gastroesophageal reflux disease)    Hypertension    Hypothyroidism    Ischemic cardiomyopathy 10/24/2016   Overview:  Ejection fraction 45% in spring 2018   Myocardial infarction [facility])    Near syncope    Nonsustained ventricular tachycardia ([place]) 07/17/2018   OSA (obstructive sleep apnea)    OSA on CPAP 11/14/2015   CPAP 13    Restless leg syndrome 11/30/2016   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    Tobacco History: Social History   Tobacco Use  Smoking Status Former Smoker   Packs/day: 0.50   Years: 4.00   Pack years: 2.00   Types: Cigarettes   Quit date: 06/11/1958   Years since quitting: 61.7  Smokeless Tobacco Former User   Types: Chew   Quit date: 12/01/1958   Counseling given: Not Answered   Continue to not smoke  Outpatient Encounter Medications as of 03/08/2020  Medication Sig   aspirin EC 81 MG tablet Take 81 mg by mouth daily.   Calcium Carb-Cholecalciferol (CALCIUM 600+D3 PO) Take 1 tablet by mouth daily at 12 noon.   clopidogrel (PLAVIX) 75 MG tablet Take 1 tablet (75 mg total) by mouth daily.   dexlansoprazole (DEXILANT) 60 MG capsule Take 60 mg by mouth daily  before breakfast.   furosemide (LASIX) 40 MG tablet Take 60 mg daily (1.5 tablet).   Glycerin-Hypromellose-PEG 400 (DRY EYE RELIEF DROPS) 0.2-0.2-1 % SOLN Place 1 drop into both eyes 3 (three) times daily as needed (dry/irritated eyes.).   levothyroxine (SYNTHROID, LEVOTHROID) 50 MCG tablet Take 50 mcg by mouth daily before breakfast.   losartan (COZAAR) 25 MG tablet Take 1/2 (one-half) tablet by mouth once daily   metoprolol succinate (TOPROL-XL) 25 MG 24 hr tablet TAKE 1 TABLET BY MOUTH ONCE DAILY TAKE  WITH  OR  IMMEDIATELY  FOLLOWING  A  MEAL.   Multiple Vitamin (MULTIVITAMIN WITH MINERALS) TABS tablet Take 1 tablet by mouth daily at 12 noon.   nitroGLYCERIN (NITROSTAT) 0.4 MG SL tablet Place 1 tablet (0.4 mg total) under the tongue every 5 (five) minutes as needed for chest pain.   potassium chloride (KLOR-CON) 10 MEQ tablet Take 1 tablet by mouth once daily   pramipexole (MIRAPEX) 1.5 MG tablet Take 1.5 mg by mouth at bedtime.    rosuvastatin (CRESTOR) 20 MG tablet Take 1 tablet by mouth once daily   ascorbic acid (CVS VITAMIN C) 500 MG tablet Take 500 mg by mouth daily at 12 noon. (Patient not taking: Reported on 03/08/2020)   [DISCONTINUED] levothyroxine (SYNTHROID) 75 MCG tablet Take 75 mcg by mouth daily.   [DISCONTINUED] ranolazine (RANEXA) 500 MG 12 hr tablet Take 1 tablet (500 mg total) by mouth 2 (two) times daily.   No facility-administered encounter medications on file as of 03/08/2020.     Review of Systems  Review of Systems  No lower extremity swelling.  No cough.  Denies seasonal allergies.  Comprehensive review of systems otherwise negative.  Physical Exam  BP 120/60 (BP Location: Right Arm, Cuff Size: Normal)    Pulse 61    Temp (!) 96.7 F (35.9 C) (Skin)    Ht 5\' 10"  (1.778 m)    Wt 184 lb 12.8 oz (83.8 kg)    SpO2 96%    BMI 26.52 kg/m   Wt Readings from Last 5 Encounters:  03/08/20 184 lb 12.8 oz (83.8 kg)  01/20/20 184 lb (83.5 kg)  11/05/19 178  lb 3.2 oz (80.8 kg)  10/13/19 181 lb (82.1 kg)  09/21/19 180 lb (81.6 kg)    BMI Readings from Last 5 Encounters:  03/08/20 26.52 kg/m  01/20/20 26.40 kg/m  11/05/19 25.57 kg/m  10/13/19 25.97 kg/m  09/21/19 25.83 kg/m     Physical Exam General: Well-appearing, sitting up in exam chair Eyes: EOMI, no icterus Neck: No JVP appreciated sitting upright, supple Respiratory: Inspiratory crackles bilateral bases clears as move up chest, no wheezing Cardiovascular: Regular rhythm, no murmurs Abdomen: Soft, bowel sounds present MSK: No synovitis, no joint effusion Neuro: Normal gait, no weakness Psych: Normal mood, full affect   Assessment & Plan:   Dyspnea on exertion: New, possibly worsening.  Oddly, worse on flat surfaces when walking as opposed to what sounds like heavier exertion working in the yard, using chainsaw.  Suspect multifactorial.  Review of CT scan 10/2019 with no parenchymal abnormality, favor interlobular septal thickening to reflect mild pulmonary edema as do not see any other signs of fibrosis.  No UIP or NSIP pattern on imaging.  He has crackles on exam which could be indicative of edema.  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 Suspect some worsening diastolic dysfunction with exercise.  However, he has not improved with increased diuretic.  Crackles could be indicative of early fibrosis not well seen on prior expiratory CTA.  Interestingly, he had mild restriction in PFTs 2014.  We will obtain full PFTs as well as high-resolution CT respiratory/expiratory and prone/supine to further evaluate prior imaging abnormalities and to rule in or rule out a restrictive/fibrotic process that could be contributing.  If these tests are normal and reassuring and cardiology is satisfied from a heart standpoint, left with element of deconditioning as possible contributor.   History of OSA: She was on CPAP  but seems was stopped in  2018 when prior patient of Dr. Elsworth Soho.  Recent overnight oximetry via PCP shows desaturation.  Encouraged patient and wife to follow-up with PCP regarding planned sleep study to reevaluate OSA.  Untreated OSA could contribute to daytime dyspnea symptoms.  Return in about 2 months (around 05/08/2020).   Lanier Clam, MD 03/08/2020

## 2020-03-08 NOTE — Patient Instructions (Addendum)
Nice to meet you!  We need more information.  We will get a full set of breathing tests in the next 1-2 weeks.  In addition, I have ordered a special CT of the chest to evaluate for fibrosis of scar of the lung. I hope that will be done next week or sooner if able.   Come back in 2 months with Dr. Silas Flood.

## 2020-03-11 DIAGNOSIS — K219 Gastro-esophageal reflux disease without esophagitis: Secondary | ICD-10-CM | POA: Diagnosis not present

## 2020-03-11 DIAGNOSIS — R7303 Prediabetes: Secondary | ICD-10-CM | POA: Diagnosis not present

## 2020-03-11 DIAGNOSIS — I2510 Atherosclerotic heart disease of native coronary artery without angina pectoris: Secondary | ICD-10-CM | POA: Diagnosis not present

## 2020-03-11 DIAGNOSIS — E039 Hypothyroidism, unspecified: Secondary | ICD-10-CM | POA: Diagnosis not present

## 2020-03-11 DIAGNOSIS — Z23 Encounter for immunization: Secondary | ICD-10-CM | POA: Diagnosis not present

## 2020-03-14 DIAGNOSIS — R0600 Dyspnea, unspecified: Secondary | ICD-10-CM | POA: Diagnosis not present

## 2020-03-14 DIAGNOSIS — J479 Bronchiectasis, uncomplicated: Secondary | ICD-10-CM | POA: Diagnosis not present

## 2020-03-14 DIAGNOSIS — I700 Atherosclerosis of aorta: Secondary | ICD-10-CM | POA: Diagnosis not present

## 2020-03-14 DIAGNOSIS — N200 Calculus of kidney: Secondary | ICD-10-CM | POA: Diagnosis not present

## 2020-03-14 DIAGNOSIS — J84112 Idiopathic pulmonary fibrosis: Secondary | ICD-10-CM | POA: Diagnosis not present

## 2020-03-14 DIAGNOSIS — I517 Cardiomegaly: Secondary | ICD-10-CM | POA: Diagnosis not present

## 2020-03-18 ENCOUNTER — Other Ambulatory Visit: Payer: Self-pay

## 2020-03-18 ENCOUNTER — Ambulatory Visit (INDEPENDENT_AMBULATORY_CARE_PROVIDER_SITE_OTHER): Payer: PPO | Admitting: Pulmonary Disease

## 2020-03-18 DIAGNOSIS — R0609 Other forms of dyspnea: Secondary | ICD-10-CM

## 2020-03-18 DIAGNOSIS — R0600 Dyspnea, unspecified: Secondary | ICD-10-CM | POA: Diagnosis not present

## 2020-03-18 LAB — PULMONARY FUNCTION TEST
DL/VA % pred: 100 %
DL/VA: 3.96 ml/min/mmHg/L
DLCO cor % pred: 75 %
DLCO cor: 18.8 ml/min/mmHg
DLCO unc % pred: 75 %
DLCO unc: 18.8 ml/min/mmHg
FEF 25-75 Post: 2.87 L/sec
FEF 25-75 Pre: 2.46 L/sec
FEF2575-%Change-Post: 16 %
FEF2575-%Pred-Post: 134 %
FEF2575-%Pred-Pre: 115 %
FEV1-%Change-Post: 4 %
FEV1-%Pred-Post: 79 %
FEV1-%Pred-Pre: 75 %
FEV1-Post: 2.37 L
FEV1-Pre: 2.26 L
FEV1FVC-%Change-Post: 4 %
FEV1FVC-%Pred-Pre: 111 %
FEV6-%Change-Post: 0 %
FEV6-%Pred-Post: 71 %
FEV6-%Pred-Pre: 72 %
FEV6-Post: 2.8 L
FEV6-Pre: 2.8 L
FEV6FVC-%Pred-Post: 106 %
FEV6FVC-%Pred-Pre: 106 %
FVC-%Change-Post: 0 %
FVC-%Pred-Post: 67 %
FVC-%Pred-Pre: 67 %
FVC-Post: 2.8 L
FVC-Pre: 2.8 L
Post FEV1/FVC ratio: 85 %
Post FEV6/FVC ratio: 100 %
Pre FEV1/FVC ratio: 81 %
Pre FEV6/FVC Ratio: 100 %
RV % pred: 75 %
RV: 1.96 L
TLC % pred: 66 %
TLC: 4.7 L

## 2020-03-18 NOTE — Progress Notes (Signed)
Full PFT performed today. °

## 2020-03-20 DIAGNOSIS — G4730 Sleep apnea, unspecified: Secondary | ICD-10-CM | POA: Diagnosis not present

## 2020-03-24 DIAGNOSIS — L570 Actinic keratosis: Secondary | ICD-10-CM | POA: Diagnosis not present

## 2020-03-24 DIAGNOSIS — C44629 Squamous cell carcinoma of skin of left upper limb, including shoulder: Secondary | ICD-10-CM | POA: Diagnosis not present

## 2020-04-07 ENCOUNTER — Other Ambulatory Visit: Payer: Self-pay

## 2020-04-07 ENCOUNTER — Encounter: Payer: Self-pay | Admitting: Cardiology

## 2020-04-07 ENCOUNTER — Ambulatory Visit: Payer: PPO | Admitting: Cardiology

## 2020-04-07 VITALS — BP 130/70 | HR 70 | Wt 189.2 lb

## 2020-04-07 DIAGNOSIS — R0600 Dyspnea, unspecified: Secondary | ICD-10-CM | POA: Diagnosis not present

## 2020-04-07 DIAGNOSIS — J849 Interstitial pulmonary disease, unspecified: Secondary | ICD-10-CM | POA: Insufficient documentation

## 2020-04-07 DIAGNOSIS — Z951 Presence of aortocoronary bypass graft: Secondary | ICD-10-CM | POA: Diagnosis not present

## 2020-04-07 DIAGNOSIS — R0609 Other forms of dyspnea: Secondary | ICD-10-CM

## 2020-04-07 DIAGNOSIS — J84112 Idiopathic pulmonary fibrosis: Secondary | ICD-10-CM | POA: Insufficient documentation

## 2020-04-07 DIAGNOSIS — E785 Hyperlipidemia, unspecified: Secondary | ICD-10-CM

## 2020-04-07 HISTORY — DX: Interstitial pulmonary disease, unspecified: J84.9

## 2020-04-07 HISTORY — DX: Idiopathic pulmonary fibrosis: J84.112

## 2020-04-07 NOTE — Addendum Note (Signed)
Addended by: Senaida Ores on: 04/07/2020 09:28 AM   Modules accepted: Orders

## 2020-04-07 NOTE — Patient Instructions (Signed)
Medication Instructions:  Your physician recommends that you continue on your current medications as directed. Please refer to the Current Medication list given to you today.  *If you need a refill on your cardiac medications before your next appointment, please call your pharmacy*   Lab Work: Your physician recommends that you return for lab work when fasting: lipid bmp, prop bnp  If you have labs (blood work) drawn today and your tests are completely normal, you will receive your results only by: Marland Kitchen MyChart Message (if you have MyChart) OR . A paper copy in the mail If you have any lab test that is abnormal or we need to change your treatment, we will call you to review the results.   Testing/Procedures: None   Follow-Up: At [facility], you and your health needs are our priority.  As part of our continuing mission to provide you with exceptional heart care, we have created designated Provider Care Teams.  These Care Teams include your primary Cardiologist (physician) and Advanced Practice Providers (APPs -  Physician Assistants and Nurse Practitioners) who all work together to provide you with the care you need, when you need it.  We recommend signing up for the patient portal called "MyChart".  Sign up information is provided on this After Visit Summary.  MyChart is used to connect with patients for Virtual Visits (Telemedicine).  Patients are able to view lab/test results, encounter notes, upcoming appointments, etc.  Non-urgent messages can be sent to your provider as well.   To learn more about what you can do with MyChart, go to [email or web address].    Your next appointment:   5 month(s)  The format for your next appointment:   In Person  Provider:   Jenne Campus, MD   Other Instructions

## 2020-04-07 NOTE — Progress Notes (Signed)
Cardiology Office Note:    Date:  04/07/2020   ID:  Shane Massey, DOB [birth year], MRN [id number]  PCP:  Shane Shivers, MD  Cardiologist:  Shane Campus, MD    Referring MD: Shane Shivers, MD   No chief complaint on file. I am doing better but I am still some short of breath  History of Present Illness:    Shane Massey is a 79 y.o. male with past medical history significant for coronary artery disease.  In March 2017 he required PTCA and stenting of the mid LAD he also have history of ischemic cardiomyopathy however ejection fraction determined in January 2020 was normal.  He been complaining of fatigue tiredness for long period time eventually we decided to pursue cardiac catheterization.  That showed 60 to 75% stenosis of left main, 80% stenosis of ramus intermedius, 50% stenosis first diagonal branch on 08/13/2019 he ended up having coronary bypass graft with LIMA to LAD, left radial to second obtuse marginal branch and RIMA to ramus intermedius at the same time he did have PFO closure.  Recovery was complicated by an episode of atrial fibrillation successfully managed with amiodarone/anticoagulation, at the time of hospital living his ejection fraction was 45%.  Ongoing complaint is shortness of breath.  He does have some fine crackles on physical examination indicating possibility of pulmonary fibrosis.  He was referred to pulmonary.  Latest CT that I am reviewing done on 03/14/2020 showing presence of fibrotic changes within his lungs. He is looking forward to hunting season he likes to hunt and he has every single year.  I encouraged him to be more active try to build up stamina.  He is complaining of having fatigue and tiredness of his leg but pulses are excellent in both sides.  Denies have any chest pain tightness squeezing pressure burning chest.  Past Medical History:  Diagnosis Date  . CAD (coronary artery disease) 08/12/2019  . Coronary artery disease   . Dizziness  06/14/2017  . Dyslipidemia 09/06/2015  . Dyspnea   . Dysrhythmia   . Exertional dyspnea 09/06/2015  . GERD (gastroesophageal reflux disease)   . Hypertension   . Hypothyroidism   . Ischemic cardiomyopathy 10/24/2016   Overview:  Ejection fraction 45% in spring 2018  . Myocardial infarction ([place])   . Near syncope   . Nonsustained ventricular tachycardia ([place]) 07/17/2018  . OSA (obstructive sleep apnea)   . OSA on CPAP 11/14/2015   CPAP 13   . Restless leg syndrome 11/30/2016  . 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 CATARACT EXTRACTION  2012  . COLONOSCOPY  2013  . CORONARY ARTERY BYPASS GRAFT N/A 08/13/2019   Procedure: CORONARY ARTERY BYPASS GRAFTING (CABG) TIMES 3.;  Surgeon: Wonda Olds, MD;  Location: [place];  Service: Open Heart Surgery;  Laterality: N/A;  . CORONARY STENT PLACEMENT    . heart stent Left 09/2015  . HERNIA REPAIR  11/1977  . LEFT HEART CATH AND CORONARY ANGIOGRAPHY N/A 08/06/2019   Procedure: LEFT HEART CATH AND CORONARY ANGIOGRAPHY;  Surgeon: Troy Sine, MD;  Location: [place] [facility];  Service: Cardiovascular;  Laterality: N/A;  . RADIAL ARTERY HARVEST Left 08/13/2019   Procedure: OPEN RADIAL ARTERY HARVEST;  Surgeon: Wonda Olds, MD;  Location: [place];  Service: Open Heart Surgery;  Laterality: Left;  . REPAIR OF PATENT FORAMEN OVALE N/A 08/13/2019   Procedure: CLOSURE OF PATENT FORAMEN OVALE;  Surgeon: Wonda Olds, MD;  Location: [place];  Service: Open Heart Surgery;  Laterality: N/A;  . SURGERY FOR BOWEL ABSCESS  10/1959  . TEE WITHOUT CARDIOVERSION N/A 08/13/2019   Procedure: TRANSESOPHAGEAL ECHOCARDIOGRAM (TEE);  Surgeon: Wonda Olds, MD;  Location: [place];  Service: Open Heart Surgery;  Laterality: N/A;    Current Medications: Current Meds  Medication Sig  . ascorbic acid (CVS VITAMIN C) 500 MG tablet Take 500 mg by mouth daily at 12 noon.   Marland Kitchen aspirin EC 81 MG tablet Take 81 mg by mouth daily.  .  Calcium Carb-Cholecalciferol (CALCIUM 600+D3 PO) Take 1 tablet by mouth daily at 12 noon.  . clopidogrel (PLAVIX) 75 MG tablet Take 1 tablet (75 mg total) by mouth daily.  Marland Kitchen dexlansoprazole (DEXILANT) 60 MG capsule Take 60 mg by mouth daily before breakfast.  . furosemide (LASIX) 40 MG tablet Take 60 mg daily (1.5 tablet).  . Glycerin-Hypromellose-PEG 400 (DRY EYE RELIEF DROPS) 0.2-0.2-1 % SOLN Place 1 drop into both eyes 3 (three) times daily as needed (dry/irritated eyes.).  Marland Kitchen levothyroxine (SYNTHROID, LEVOTHROID) 50 MCG tablet Take 50 mcg by mouth daily before breakfast.  . losartan (COZAAR) 25 MG tablet Take 1/2 (one-half) tablet by mouth once daily  . metoprolol succinate (TOPROL-XL) 25 MG 24 hr tablet TAKE 1 TABLET BY MOUTH ONCE DAILY TAKE  WITH  OR  IMMEDIATELY  FOLLOWING  A  MEAL.  . Multiple Vitamin (MULTIVITAMIN WITH MINERALS) TABS tablet Take 1 tablet by mouth daily at 12 noon.  . nitroGLYCERIN (NITROSTAT) 0.4 MG SL tablet Place 1 tablet (0.4 mg total) under the tongue every 5 (five) minutes as needed for chest pain.  . potassium chloride (KLOR-CON) 10 MEQ tablet Take 1 tablet by mouth once daily  . pramipexole (MIRAPEX) 1.5 MG tablet Take 1.5 mg by mouth at bedtime.   . rosuvastatin (CRESTOR) 20 MG tablet Take 1 tablet by mouth once daily     Allergies:   Patient has no known allergies.   Social History   Socioeconomic History  . Marital status: Married    Spouse name: Not on file  . Number of children: Not on file  . Years of education: Not on file  . Highest education level: Not on file  Occupational History  . Not on file  Tobacco Use  . Smoking status: Former Smoker    Packs/day: 0.50    Years: 4.00    Pack years: 2.00    Types: Cigarettes    Quit date: 06/11/1958    Years since quitting: 61.8  . Smokeless tobacco: Former Systems developer    Types: Chew    Quit date: 12/01/1958  Vaping Use  . Vaping Use: Never used  Substance and Sexual Activity  . Alcohol use: No     Alcohol/week: 0.0 standard drinks  . Drug use: No  . Sexual activity: Not on file  Other Topics Concern  . Not on file  Social History Narrative  . Not on file   Social Determinants of Health   Financial Resource Strain:   . Difficulty of Paying Living Expenses: Not on file  Food Insecurity:   . Worried About Charity fundraiser in the Last Year: Not on file  . Ran Out of Food in the Last Year: Not on file  Transportation Needs:   . Lack of Transportation (Medical): Not on file  . Lack of Transportation (Non-Medical): Not on file  Physical Activity:   . Days of  Exercise per Week: Not on file  . Minutes of Exercise per Session: Not on file  Stress:   . Feeling of Stress : Not on file  Social Connections:   . Frequency of Communication with Friends and Family: Not on file  . Frequency of Social Gatherings with Friends and Family: Not on file  . Attends Religious Services: Not on file  . Active Member of Clubs or Organizations: Not on file  . Attends Archivist Meetings: Not on file  . Marital Status: Not on file     Family History: The patient's family history includes Asthma in his brother and maternal grandmother; Heart attack in his brother; Heart disease in his brother; Stroke in his father. ROS:   Please see the history of present illness.    All 14 point review of systems negative except as described per history of present illness  EKGs/Labs/Other Studies Reviewed:      Recent Labs: 08/14/2019: Magnesium 2.8 10/13/2019: ALT 12; Hemoglobin 11.2; NT-Pro BNP 718; Platelets 232; TSH 3.590 10/22/2019: BUN 12; Creatinine, Ser 0.98; Potassium 4.8; Sodium 137  Recent Lipid Panel    Component Value Date/Time   CHOL 114 08/13/2019 0603   TRIG 125 08/13/2019 0603   HDL 46 08/13/2019 0603   CHOLHDL 2.5 08/13/2019 0603   VLDL 25 08/13/2019 0603   LDLCALC 43 08/13/2019 0603    Physical Exam:    VS:  BP 130/70   Pulse 70   Wt 189 lb 3.2 oz (85.8 kg)   SpO2 98%    BMI 27.15 kg/m     Wt Readings from Last 3 Encounters:  04/07/20 189 lb 3.2 oz (85.8 kg)  03/08/20 184 lb 12.8 oz (83.8 kg)  01/20/20 184 lb (83.5 kg)     GEN:  Well nourished, well developed in no acute distress HEENT: Normal NECK: No JVD; No carotid bruits LYMPHATICS: No lymphadenopathy CARDIAC: RRR, no murmurs, no rubs, no gallops RESPIRATORY: Bilateral basal crackles and rails ABDOMEN: Soft, non-tender, non-distended MUSCULOSKELETAL:  No edema; No deformity  SKIN: Warm and dry LOWER EXTREMITIES: no swelling NEUROLOGIC:  Alert and oriented x 3 PSYCHIATRIC:  Normal affect   ASSESSMENT:    1. S/P CABG x 3   2. Exertional dyspnea   3. Dyslipidemia   4. 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 Status post coronary bypass graft with good recovery.  Overall doing well.  Denies having any cardiac complaints.  We will continue present management which include dual antiplatelets therapy until March of next year. 2. Exertional dyspnea multifactorial but I suspect pulmonary issue is important.  I will schedule him to have proBNP done to see if there is any component of congestive heart failure. 3. Dyslipidemia I did review his last fasting lipid profile and start the repeated K PN show me his data from March of this year showing LDL of 43, HDL of 46.  Will check his fasting lipid profile. 4. Idiopathic pulmonary fibrosis.  Physical examination showed some crackles/Rales on both bases, CT showed pulmonary fibrosis.  He already had pulmonary function test I do not have results of this, I did review his CT done in our hospital showing fibrotic changes he is scheduled to see pulmonary physician shortly.   Medication Adjustments/Labs and Tests Ordered: Current medicines are reviewed at length with the patient today.  Concerns regarding medicines are outlined above.  No orders of the defined types were  placed in this encounter.  Medication  changes: No orders of the defined types were placed in this encounter.   Signed, Park Liter, MD, [facility] 04/07/2020 9:06 AM    [place]

## 2020-04-11 DIAGNOSIS — R7303 Prediabetes: Secondary | ICD-10-CM | POA: Diagnosis not present

## 2020-04-11 DIAGNOSIS — E039 Hypothyroidism, unspecified: Secondary | ICD-10-CM | POA: Diagnosis not present

## 2020-04-11 DIAGNOSIS — I2510 Atherosclerotic heart disease of native coronary artery without angina pectoris: Secondary | ICD-10-CM | POA: Diagnosis not present

## 2020-04-11 DIAGNOSIS — Z6828 Body mass index (BMI) 28.0-28.9, adult: Secondary | ICD-10-CM | POA: Diagnosis not present

## 2020-04-11 DIAGNOSIS — K219 Gastro-esophageal reflux disease without esophagitis: Secondary | ICD-10-CM | POA: Diagnosis not present

## 2020-04-11 DIAGNOSIS — G4733 Obstructive sleep apnea (adult) (pediatric): Secondary | ICD-10-CM | POA: Diagnosis not present

## 2020-04-12 DIAGNOSIS — Z951 Presence of aortocoronary bypass graft: Secondary | ICD-10-CM | POA: Diagnosis not present

## 2020-04-12 DIAGNOSIS — R0600 Dyspnea, unspecified: Secondary | ICD-10-CM | POA: Diagnosis not present

## 2020-04-12 DIAGNOSIS — E785 Hyperlipidemia, unspecified: Secondary | ICD-10-CM | POA: Diagnosis not present

## 2020-04-12 DIAGNOSIS — J84112 Idiopathic pulmonary fibrosis: Secondary | ICD-10-CM | POA: Diagnosis not present

## 2020-04-13 LAB — LIPID PANEL
Chol/HDL Ratio: 2.7 ratio (ref 0.0–5.0)
Cholesterol, Total: 128 mg/dL (ref 100–199)
HDL: 48 mg/dL (ref >39)
LDL Chol Calc (NIH): 60 mg/dL (ref 0–99)
Triglycerides: 111 mg/dL (ref 0–149)
VLDL Cholesterol Cal: 20 mg/dL (ref 5–40)

## 2020-04-13 LAB — BASIC METABOLIC PANEL
BUN/Creatinine Ratio: 17 (ref 10–24)
BUN: 15 mg/dL (ref 8–27)
CO2: 24 mmol/L (ref 20–29)
Calcium: 9.5 mg/dL (ref 8.6–10.2)
Chloride: 105 mmol/L (ref 96–106)
Creatinine, Ser: 0.86 mg/dL (ref 0.76–1.27)
GFR calc Af Amer: 97 mL/min/{1.73_m2} (ref >59)
GFR calc non Af Amer: 84 mL/min/{1.73_m2} (ref >59)
Glucose: 104 mg/dL — ABNORMAL HIGH (ref 65–99)
Potassium: 4.5 mmol/L (ref 3.5–5.2)
Sodium: 141 mmol/L (ref 134–144)

## 2020-04-13 LAB — PRO B NATRIURETIC PEPTIDE: NT-Pro BNP: 175 pg/mL (ref 0–486)

## 2020-04-18 ENCOUNTER — Telehealth: Payer: Self-pay | Admitting: Cardiology

## 2020-04-18 NOTE — Telephone Encounter (Signed)
Labs are looking good, continue present management cholesterol is good

## 2020-04-18 NOTE — Telephone Encounter (Signed)
Pt's wife aware of lab results ./cy 

## 2020-04-18 NOTE — Telephone Encounter (Signed)
Patient's wife is returning call to discuss lab results. 

## 2020-04-25 DIAGNOSIS — L578 Other skin changes due to chronic exposure to nonionizing radiation: Secondary | ICD-10-CM | POA: Diagnosis not present

## 2020-04-25 DIAGNOSIS — C44622 Squamous cell carcinoma of skin of right upper limb, including shoulder: Secondary | ICD-10-CM | POA: Diagnosis not present

## 2020-04-25 DIAGNOSIS — L570 Actinic keratosis: Secondary | ICD-10-CM | POA: Diagnosis not present

## 2020-04-25 DIAGNOSIS — L820 Inflamed seborrheic keratosis: Secondary | ICD-10-CM | POA: Diagnosis not present

## 2020-04-25 DIAGNOSIS — L821 Other seborrheic keratosis: Secondary | ICD-10-CM | POA: Diagnosis not present

## 2020-05-09 ENCOUNTER — Ambulatory Visit: Payer: PPO | Admitting: Pulmonary Disease

## 2020-05-09 ENCOUNTER — Other Ambulatory Visit: Payer: Self-pay

## 2020-05-09 ENCOUNTER — Encounter: Payer: Self-pay | Admitting: Pulmonary Disease

## 2020-05-09 VITALS — BP 124/70 | HR 69 | Temp 97.5°F | Ht 70.0 in | Wt 191.4 lb

## 2020-05-09 DIAGNOSIS — R0609 Other forms of dyspnea: Secondary | ICD-10-CM

## 2020-05-09 DIAGNOSIS — R0600 Dyspnea, unspecified: Secondary | ICD-10-CM

## 2020-05-09 MED ORDER — TRELEGY ELLIPTA 200-62.5-25 MCG/INH IN AEPB
1.0000 | INHALATION_SPRAY | Freq: Every day | RESPIRATORY_TRACT | 0 refills | Status: DC
Start: 2020-05-09 — End: 2020-08-31

## 2020-05-09 MED ORDER — BREO ELLIPTA 200-25 MCG/INH IN AEPB: 1.0000 | INHALATION_SPRAY | Freq: Every day | RESPIRATORY_TRACT | 11 refills | Status: DC

## 2020-05-09 NOTE — Patient Instructions (Addendum)
Nice to see you!  Use Trelegy 1 puff daily - slightly different than what I prescribed (breo) but has the necessary medicines. No need to pick up Breo - have plenty of samples but if this helps would need to pick up breo once samples run out.   If not helping by Christmas we can stop this  Follow up in 3 months with Dr. Silas Flood

## 2020-05-09 NOTE — Progress Notes (Signed)
Patient ID: Shane Massey, male    DOB: 08-29-1940, 79 y.o.   MRN: [id number]  Chief Complaint  Patient presents with  . Follow-up    SOB and wheezing worse since last visit     Referring provider: Maryella Shivers, MD  HPI:   Shane Massey is a 79 y.o. man with PMH of CAD s/p CABGx3 08/13/2019 whom we are seeing at the request of Dr. Agustin Cree for evaluation of DOE. Notes from referring provider reviewed.   05/09/2020 Breathing stable to slightly worse per his report.  Similar descriptions as in the past.  Wife indicates that with heavy exertion there is some wheezing.  Overall, is able to continue potassium relatively just takes a bit longer.  Denies orthopnea or PND.  No cough.  Denies any weight gain or lower extremity edema.  Reports he was diagnosed with asthma in the past but later told he did not have asthma by his PCP.    HPI initial visit: Patient believes his breathing has been off for 3-4 months.  He denies any breathing issues prior to CABG in March 2021.  In the weeks after surgery he had his wife state that they were gradually increasing exercise without significant breathing difficulty.  In a couple weeks after he started developing shortness of breath.  What a cure after walking a few 100 feet on their walks.  He was stop and rest.  After that he would be able to push on.  This continues to happen.  He starts walking and becomes significantly dyspneic.  He waits for a few minutes and then continues as well.  Walks three quarters of a mile to mile after that without much difficulty.  He denies any issue with working in the yard for example using his chainsaw.  No dyspnea with that type of work.  His cardiologist is continue to work this up.  CT scan 10/2019 showed on my interpretation mild interlobular septal thickening otherwise clear lungs.  However it does on my interpretation demonstrate scattered mosaicism suggestive of gas trapping.  He has been on a higher dose of Lasix  for the last 6 weeks or so.  He cannot tell any improvement in his breathing.  He denies any orthopnea or PND.  No chest pain with exertion.  He denies seasonal allergies.  He was told he had "asthma" in the past but does not believe this is true.  He is never been on inhalers.  He was seeing a pulmonologist in [place] some years ago for restless leg.  PFTs reviewed and interpreted at that time is no fixed obstruction, no bronchodilator response, spirometry suggestive of moderate restriction with lung volumes consistent with mild restriction, moderate reduced DLCO.  Notably, RV TLC ratio mildly increased at 113%.  It seems he has OSA based on prior studies in the EMR.  Wife shows me recent results of overnight oximetry with oxygen desaturation.  PCP and plan to order formal sleep/CPAP titration study but this is not been done yet.  Encouraged them to do so.  Previously seen by Dr. Elsworth Soho at [facility] over 3 years ago for OSA and restless leg.  Notes were reviewed.  PMH: CAD Surgical history: CABG three-vessel March 2021 Family history: Positive for asthma, coronary disease, and cancer Social history: Lives in [place], approximate 2-pack-year smoking history quit when he was 79 years of age 4 / Pulmonary Flowsheets:   ACT:  No flowsheet data found.  MMRC: mMRC Dyspnea Scale mMRC  Score  03/08/2020 1    Epworth:  No flowsheet data found.  Tests:   FENO:  No results found for: NITRICOXIDE  PFT: PFT Results Latest Ref Rng & Units 03/18/2020  FVC-Pre L 2.80  FVC-Predicted Pre % 67  FVC-Post L 2.80  FVC-Predicted Post % 67  Pre FEV1/FVC % % 81  Post FEV1/FCV % % 85  FEV1-Pre L 2.26  FEV1-Predicted Pre % 75  FEV1-Post L 2.37  DLCO uncorrected ml/min/mmHg 18.80  DLCO UNC% % 75  DLCO corrected ml/min/mmHg 18.80  DLCO COR %Predicted % 75  DLVA Predicted % 100  TLC L 4.70  TLC % Predicted % 66  RV % Predicted % 75    WALK:  No flowsheet data  found.  Imaging: Reviewed as per EMR and discussion in this note  Lab Results: Personally reviewed, postop anemia gradually improving as of May 2021 CBC    Component Value Date/Time   WBC 4.3 10/13/2019 1004   WBC 7.6 08/17/2019 0239   RBC 3.82 (L) 10/13/2019 1004   RBC 2.93 (L) 08/17/2019 0239   HGB 11.2 (L) 10/13/2019 1004   HCT 34.4 (L) 10/13/2019 1004   PLT 232 10/13/2019 1004   MCV 90 10/13/2019 1004   MCH 29.3 10/13/2019 1004   MCH 31.1 08/17/2019 0239   MCHC 32.6 10/13/2019 1004   MCHC 33.3 08/17/2019 0239   RDW 13.9 10/13/2019 1004    BMET    Component Value Date/Time   NA 141 04/12/2020 0913   K 4.5 04/12/2020 0913   CL 105 04/12/2020 0913   CO2 24 04/12/2020 0913   GLUCOSE 104 (H) 04/12/2020 0913   GLUCOSE 124 (H) 08/17/2019 0239   BUN 15 04/12/2020 0913   CREATININE 0.86 04/12/2020 0913   CALCIUM 9.5 04/12/2020 0913   GFRNONAA 84 04/12/2020 0913   GFRAA 97 04/12/2020 0913    BNP No results found for: BNP  ProBNP    Component Value Date/Time   PROBNP 175 04/12/2020 0913    Specialty Problems      Pulmonary Problems   Dyspnea on exertion   Exertional dyspnea   OSA on CPAP    CPAP 13      Dyspnea   OSA (obstructive sleep apnea)   Idiopathic pulmonary fibrosis (HCC)      No Known Allergies  Immunization History  Administered Date(s) Administered  . Fluad Quad(high Dose 65+) 03/11/2020  . Influenza Whole 04/01/2016  . Influenza-Unspecified 02/21/2015  . Moderna SARS-COV2 Booster Vaccination 04/29/2020  . Moderna SARS-COVID-2 Vaccination 07/16/2019, 08/28/2019  . Zoster Recombinat (Shingrix) 04/15/2019    Past Medical History:  Diagnosis Date  . CAD (coronary artery disease) 08/12/2019  . Coronary artery disease   . Dizziness 06/14/2017  . Dyslipidemia 09/06/2015  . Dyspnea   . Dysrhythmia   . Exertional dyspnea 09/06/2015  . GERD (gastroesophageal reflux disease)   . Hypertension   . Hypothyroidism   . Ischemic cardiomyopathy  10/24/2016   Overview:  Ejection fraction 45% in spring 2018  . Myocardial infarction ([place])   . Near syncope   . Nonsustained ventricular tachycardia ([place]) 07/17/2018  . OSA (obstructive sleep apnea)   . OSA on CPAP 11/14/2015   CPAP 13   . Restless leg syndrome 11/30/2016  . 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 Packs/day: 0.50  . Years: 4.00  . Pack years: 2.00  . Types: Cigarettes  .  Quit date: 06/11/1958  . Years since quitting: 61.9  Smokeless Tobacco Former Systems developer  . Types: Chew  . Quit date: 12/01/1958   Counseling given: Not Answered   Continue to not smoke  Outpatient Encounter Medications as of 05/09/2020  Medication Sig  . ascorbic acid ([employer] VITAMIN C) 500 MG tablet Take 500 mg by mouth daily at 12 noon.   Marland Kitchen aspirin EC 81 MG tablet Take 81 mg by mouth daily.  . Calcium Carb-Cholecalciferol (CALCIUM 600+D3 PO) Take 1 tablet by mouth daily at 12 noon.  . clopidogrel (PLAVIX) 75 MG tablet Take 1 tablet (75 mg total) by mouth daily.  Marland Kitchen dexlansoprazole (DEXILANT) 60 MG capsule Take 60 mg by mouth daily before breakfast.  . furosemide (LASIX) 40 MG tablet Take 60 mg daily (1.5 tablet).  . Glycerin-Hypromellose-PEG 400 (DRY EYE RELIEF DROPS) 0.2-0.2-1 % SOLN Place 1 drop into both eyes 3 (three) times daily as needed (dry/irritated eyes.).  Marland Kitchen levothyroxine (SYNTHROID, LEVOTHROID) 50 MCG tablet Take 50 mcg by mouth daily before breakfast.  . losartan (COZAAR) 25 MG tablet Take 1/2 (one-half) tablet by mouth once daily  . metoprolol succinate (TOPROL-XL) 25 MG 24 hr tablet TAKE 1 TABLET BY MOUTH ONCE DAILY TAKE  WITH  OR  IMMEDIATELY  FOLLOWING  A  MEAL.  . Multiple Vitamin (MULTIVITAMIN WITH MINERALS) TABS tablet Take 1 tablet by mouth daily at 12 noon.  . nitroGLYCERIN (NITROSTAT) 0.4 MG SL tablet Place 1 tablet (0.4 mg total) under the tongue every 5 (five) minutes as needed for chest pain.  . potassium chloride  (KLOR-CON) 10 MEQ tablet Take 1 tablet by mouth once daily  . pramipexole (MIRAPEX) 1.5 MG tablet Take 1.5 mg by mouth at bedtime.   . rosuvastatin (CRESTOR) 20 MG tablet Take 1 tablet by mouth once daily  . fluticasone furoate-vilanterol (BREO ELLIPTA) 200-25 MCG/INH AEPB Inhale 1 puff into the lungs daily. Rinse out your mouth after every use.  Marland Kitchen Fluticasone-Umeclidin-Vilant (TRELEGY ELLIPTA) 200-62.5-25 MCG/INH AEPB Inhale 1 puff into the lungs daily.   No facility-administered encounter medications on file as of 05/09/2020.     Review of Systems  Review of Systems  No lower extremity swelling.  No cough.  Denies seasonal allergies.  Comprehensive review of systems otherwise negative.  Physical Exam  BP 124/70 (BP Location: Left Arm, Cuff Size: Normal)   Pulse 69   Temp (!) 97.5 F (36.4 C)   Ht 5\' 10"  (1.778 m)   Wt 191 lb 6.4 oz (86.8 kg)   SpO2 98%   BMI 27.46 kg/m   Wt Readings from Last 5 Encounters:  05/09/20 191 lb 6.4 oz (86.8 kg)  04/07/20 189 lb 3.2 oz (85.8 kg)  03/08/20 184 lb 12.8 oz (83.8 kg)  01/20/20 184 lb (83.5 kg)  11/05/19 178 lb 3.2 oz (80.8 kg)    BMI Readings from Last 5 Encounters:  05/09/20 27.46 kg/m  04/07/20 27.15 kg/m  03/08/20 26.52 kg/m  01/20/20 26.40 kg/m  11/05/19 25.57 kg/m     Physical Exam General: Well-appearing, sitting up in exam chair Eyes: EOMI, no icterus Neck: No JVP appreciated sitting upright, supple Respiratory: Inspiratory crackles bilateral bases clears as move up chest, no wheezing Cardiovascular: Regular rhythm, no murmurs Abdomen: Soft, bowel sounds present MSK: No synovitis, no joint effusion Neuro: Normal gait, no weakness Psych: Normal mood, full affect   Assessment & Plan:   Dyspnea on exertion: Stable to worsening.  Oddly, worse on flat surfaces when walking  as opposed to what sounds like heavier exertion working in the yard, using chainsaw.  Suspect multifactorial.  Review of serial CT scans  from 2017 to recent CT high res 03/2020 shows stable reticular opacities in bilateral bases. No clear IPF or progression of other ILD.  Suspect scar from prior insult.  PFTs obtained 03/2020 shows moderate restriction with preserved DLCO not consistent with progressive mild due to ILD in general.  He is not obese and no clear evidence of scoliosis although he does report poor posture so do wonder if there is some extrathoracic contribution to restriction given normal DLCO.  Overall, very reassured by images and PFTs that lungs are healthy.  Will trial ICS/LABA given his prior report of possible asthma and description of wheezing.  Notably, his CT does show mild mosaicism on expiratory films-would fit with small airways disease and gas trapping.   History of OSA: She was on CPAP but seems was stopped in 2018 when prior patient of Dr. Elsworth Soho.  Recent overnight oximetry via PCP shows desaturation.  Encouraged patient and wife to follow-up with PCP regarding planned sleep study to reevaluate OSA.  Untreated OSA could contribute to daytime dyspnea symptoms.  Return in about 3 months (around 08/08/2020).   Lanier Clam, MD 05/09/2020

## 2020-05-11 DIAGNOSIS — R7303 Prediabetes: Secondary | ICD-10-CM | POA: Diagnosis not present

## 2020-05-11 DIAGNOSIS — K219 Gastro-esophageal reflux disease without esophagitis: Secondary | ICD-10-CM | POA: Diagnosis not present

## 2020-05-11 DIAGNOSIS — E039 Hypothyroidism, unspecified: Secondary | ICD-10-CM | POA: Diagnosis not present

## 2020-05-11 DIAGNOSIS — I2510 Atherosclerotic heart disease of native coronary artery without angina pectoris: Secondary | ICD-10-CM | POA: Diagnosis not present

## 2020-05-17 ENCOUNTER — Telehealth: Payer: Self-pay | Admitting: Cardiology

## 2020-05-17 MED ORDER — FUROSEMIDE 40 MG PO TABS
ORAL_TABLET | ORAL | 1 refills | Status: DC
Start: 2020-05-17 — End: 2020-08-16

## 2020-05-17 NOTE — Telephone Encounter (Signed)
Shane Massey, Shane Massey wife came into requesting a prescription for 60mg  Furosemide. Was taking 40mg  previously but Dr. Raliegh Ip had upped his dose to 60mg  and now he's not getting enough to last him the whole month. Please advise. Best number to contact is [phone] (c) or [phone] (h).  Thank you,  Ammie Dalton

## 2020-05-17 NOTE — Telephone Encounter (Signed)
Left message for wife to return call. Medication sent in.

## 2020-05-17 NOTE — Telephone Encounter (Signed)
Attempted to call wife back. No answer. There is not a 60mg  dose of lasix on 20, 40 and 80.

## 2020-05-17 NOTE — Telephone Encounter (Signed)
Patient's wife requesting the prescription be sent for 60 mg tablets, so the patient does not have to cut the pills. The prescription was sent for 40 mg tablets.

## 2020-05-18 NOTE — Telephone Encounter (Signed)
Left message for patient to return call.

## 2020-05-23 NOTE — Telephone Encounter (Signed)
Called and spoke to patient. Informed him that we cannot send a 60 mg tablet of lasix as the only mg are 20, 40, and 80. He verbally understood no further questions.

## 2020-06-11 DIAGNOSIS — R7303 Prediabetes: Secondary | ICD-10-CM | POA: Diagnosis not present

## 2020-06-11 DIAGNOSIS — I2510 Atherosclerotic heart disease of native coronary artery without angina pectoris: Secondary | ICD-10-CM | POA: Diagnosis not present

## 2020-06-11 DIAGNOSIS — K219 Gastro-esophageal reflux disease without esophagitis: Secondary | ICD-10-CM | POA: Diagnosis not present

## 2020-06-11 DIAGNOSIS — E039 Hypothyroidism, unspecified: Secondary | ICD-10-CM | POA: Diagnosis not present

## 2020-06-20 DIAGNOSIS — Z20822 Contact with and (suspected) exposure to covid-19: Secondary | ICD-10-CM | POA: Diagnosis not present

## 2020-06-20 DIAGNOSIS — R059 Cough, unspecified: Secondary | ICD-10-CM | POA: Diagnosis not present

## 2020-06-20 DIAGNOSIS — J020 Streptococcal pharyngitis: Secondary | ICD-10-CM | POA: Diagnosis not present

## 2020-06-20 DIAGNOSIS — Z1152 Encounter for screening for COVID-19: Secondary | ICD-10-CM | POA: Diagnosis not present

## 2020-07-11 DIAGNOSIS — M722 Plantar fascial fibromatosis: Secondary | ICD-10-CM | POA: Diagnosis not present

## 2020-07-11 DIAGNOSIS — Z6828 Body mass index (BMI) 28.0-28.9, adult: Secondary | ICD-10-CM | POA: Diagnosis not present

## 2020-07-12 DIAGNOSIS — E039 Hypothyroidism, unspecified: Secondary | ICD-10-CM | POA: Diagnosis not present

## 2020-07-12 DIAGNOSIS — K219 Gastro-esophageal reflux disease without esophagitis: Secondary | ICD-10-CM | POA: Diagnosis not present

## 2020-07-12 DIAGNOSIS — I2510 Atherosclerotic heart disease of native coronary artery without angina pectoris: Secondary | ICD-10-CM | POA: Diagnosis not present

## 2020-07-12 DIAGNOSIS — R7303 Prediabetes: Secondary | ICD-10-CM | POA: Diagnosis not present

## 2020-07-18 DIAGNOSIS — Z1339 Encounter for screening examination for other mental health and behavioral disorders: Secondary | ICD-10-CM | POA: Diagnosis not present

## 2020-07-18 DIAGNOSIS — Z1331 Encounter for screening for depression: Secondary | ICD-10-CM | POA: Diagnosis not present

## 2020-07-18 DIAGNOSIS — Z136 Encounter for screening for cardiovascular disorders: Secondary | ICD-10-CM | POA: Diagnosis not present

## 2020-07-18 DIAGNOSIS — Z139 Encounter for screening, unspecified: Secondary | ICD-10-CM | POA: Diagnosis not present

## 2020-07-18 DIAGNOSIS — Z1389 Encounter for screening for other disorder: Secondary | ICD-10-CM | POA: Diagnosis not present

## 2020-07-18 DIAGNOSIS — Z0000 Encounter for general adult medical examination without abnormal findings: Secondary | ICD-10-CM | POA: Diagnosis not present

## 2020-07-19 DIAGNOSIS — G4733 Obstructive sleep apnea (adult) (pediatric): Secondary | ICD-10-CM | POA: Diagnosis not present

## 2020-07-21 DIAGNOSIS — L570 Actinic keratosis: Secondary | ICD-10-CM | POA: Diagnosis not present

## 2020-07-21 DIAGNOSIS — C44622 Squamous cell carcinoma of skin of right upper limb, including shoulder: Secondary | ICD-10-CM | POA: Diagnosis not present

## 2020-08-09 DIAGNOSIS — I2510 Atherosclerotic heart disease of native coronary artery without angina pectoris: Secondary | ICD-10-CM | POA: Diagnosis not present

## 2020-08-09 DIAGNOSIS — K219 Gastro-esophageal reflux disease without esophagitis: Secondary | ICD-10-CM | POA: Diagnosis not present

## 2020-08-09 DIAGNOSIS — R7303 Prediabetes: Secondary | ICD-10-CM | POA: Diagnosis not present

## 2020-08-09 DIAGNOSIS — E039 Hypothyroidism, unspecified: Secondary | ICD-10-CM | POA: Diagnosis not present

## 2020-08-15 DIAGNOSIS — I10 Essential (primary) hypertension: Secondary | ICD-10-CM | POA: Diagnosis not present

## 2020-08-15 DIAGNOSIS — R7303 Prediabetes: Secondary | ICD-10-CM | POA: Diagnosis not present

## 2020-08-15 DIAGNOSIS — E039 Hypothyroidism, unspecified: Secondary | ICD-10-CM | POA: Diagnosis not present

## 2020-08-16 ENCOUNTER — Other Ambulatory Visit: Payer: Self-pay | Admitting: Cardiology

## 2020-08-16 DIAGNOSIS — G4733 Obstructive sleep apnea (adult) (pediatric): Secondary | ICD-10-CM | POA: Diagnosis not present

## 2020-08-22 DIAGNOSIS — I10 Essential (primary) hypertension: Secondary | ICD-10-CM | POA: Diagnosis not present

## 2020-08-22 DIAGNOSIS — E039 Hypothyroidism, unspecified: Secondary | ICD-10-CM | POA: Diagnosis not present

## 2020-08-22 DIAGNOSIS — R7303 Prediabetes: Secondary | ICD-10-CM | POA: Diagnosis not present

## 2020-08-22 DIAGNOSIS — E785 Hyperlipidemia, unspecified: Secondary | ICD-10-CM | POA: Diagnosis not present

## 2020-08-25 IMAGING — DX DG CHEST 2V
2 series · 2 of 2 positions shown · non-contrast
Comparison: Chest x-ray dated August 16, 2019.

CLINICAL DATA: Shortness of breath.  Recent CABG.

EXAM:
CHEST - 2 VIEW

[dg chest 2 view (1 of 2)]
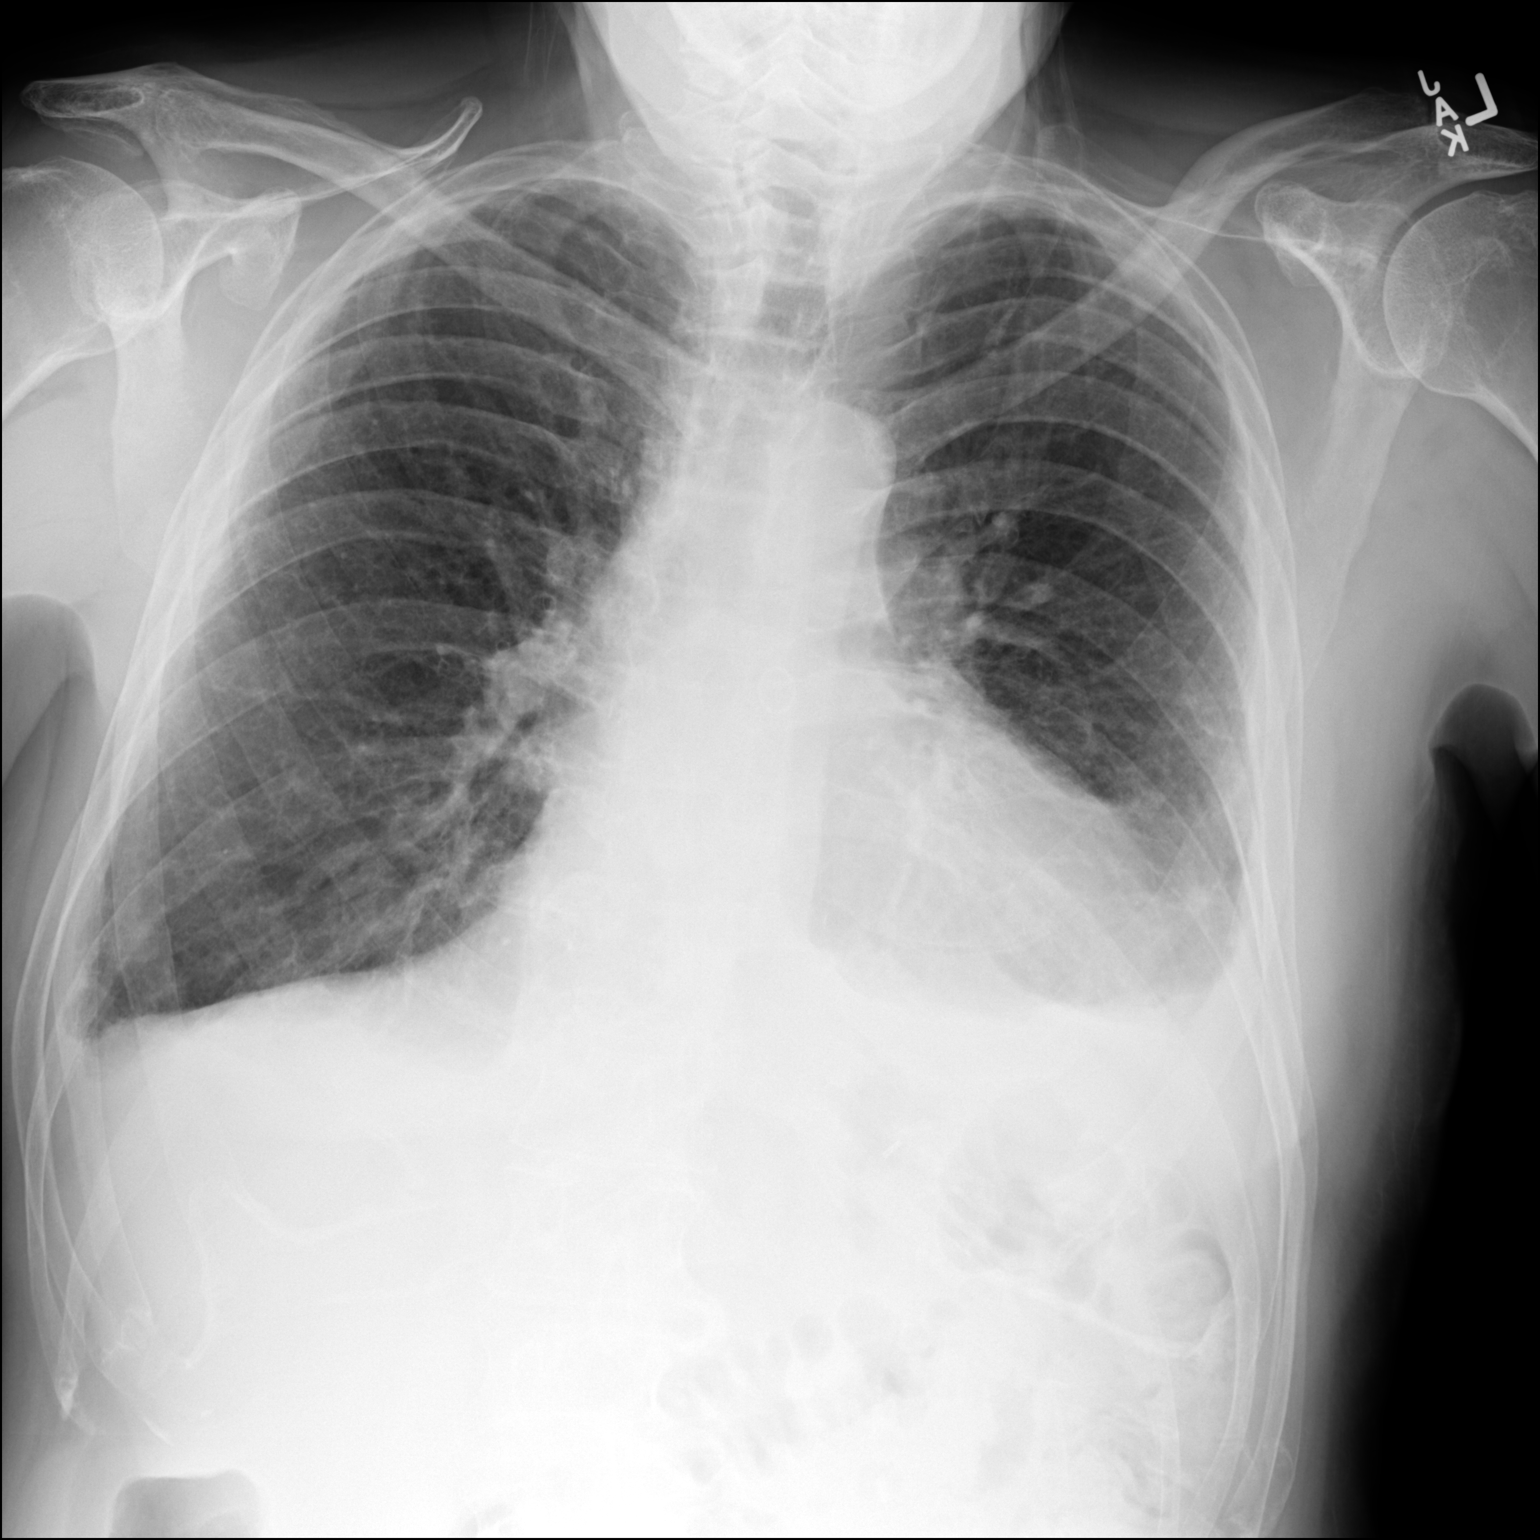

[dg chest 2 view (2 of 2)]
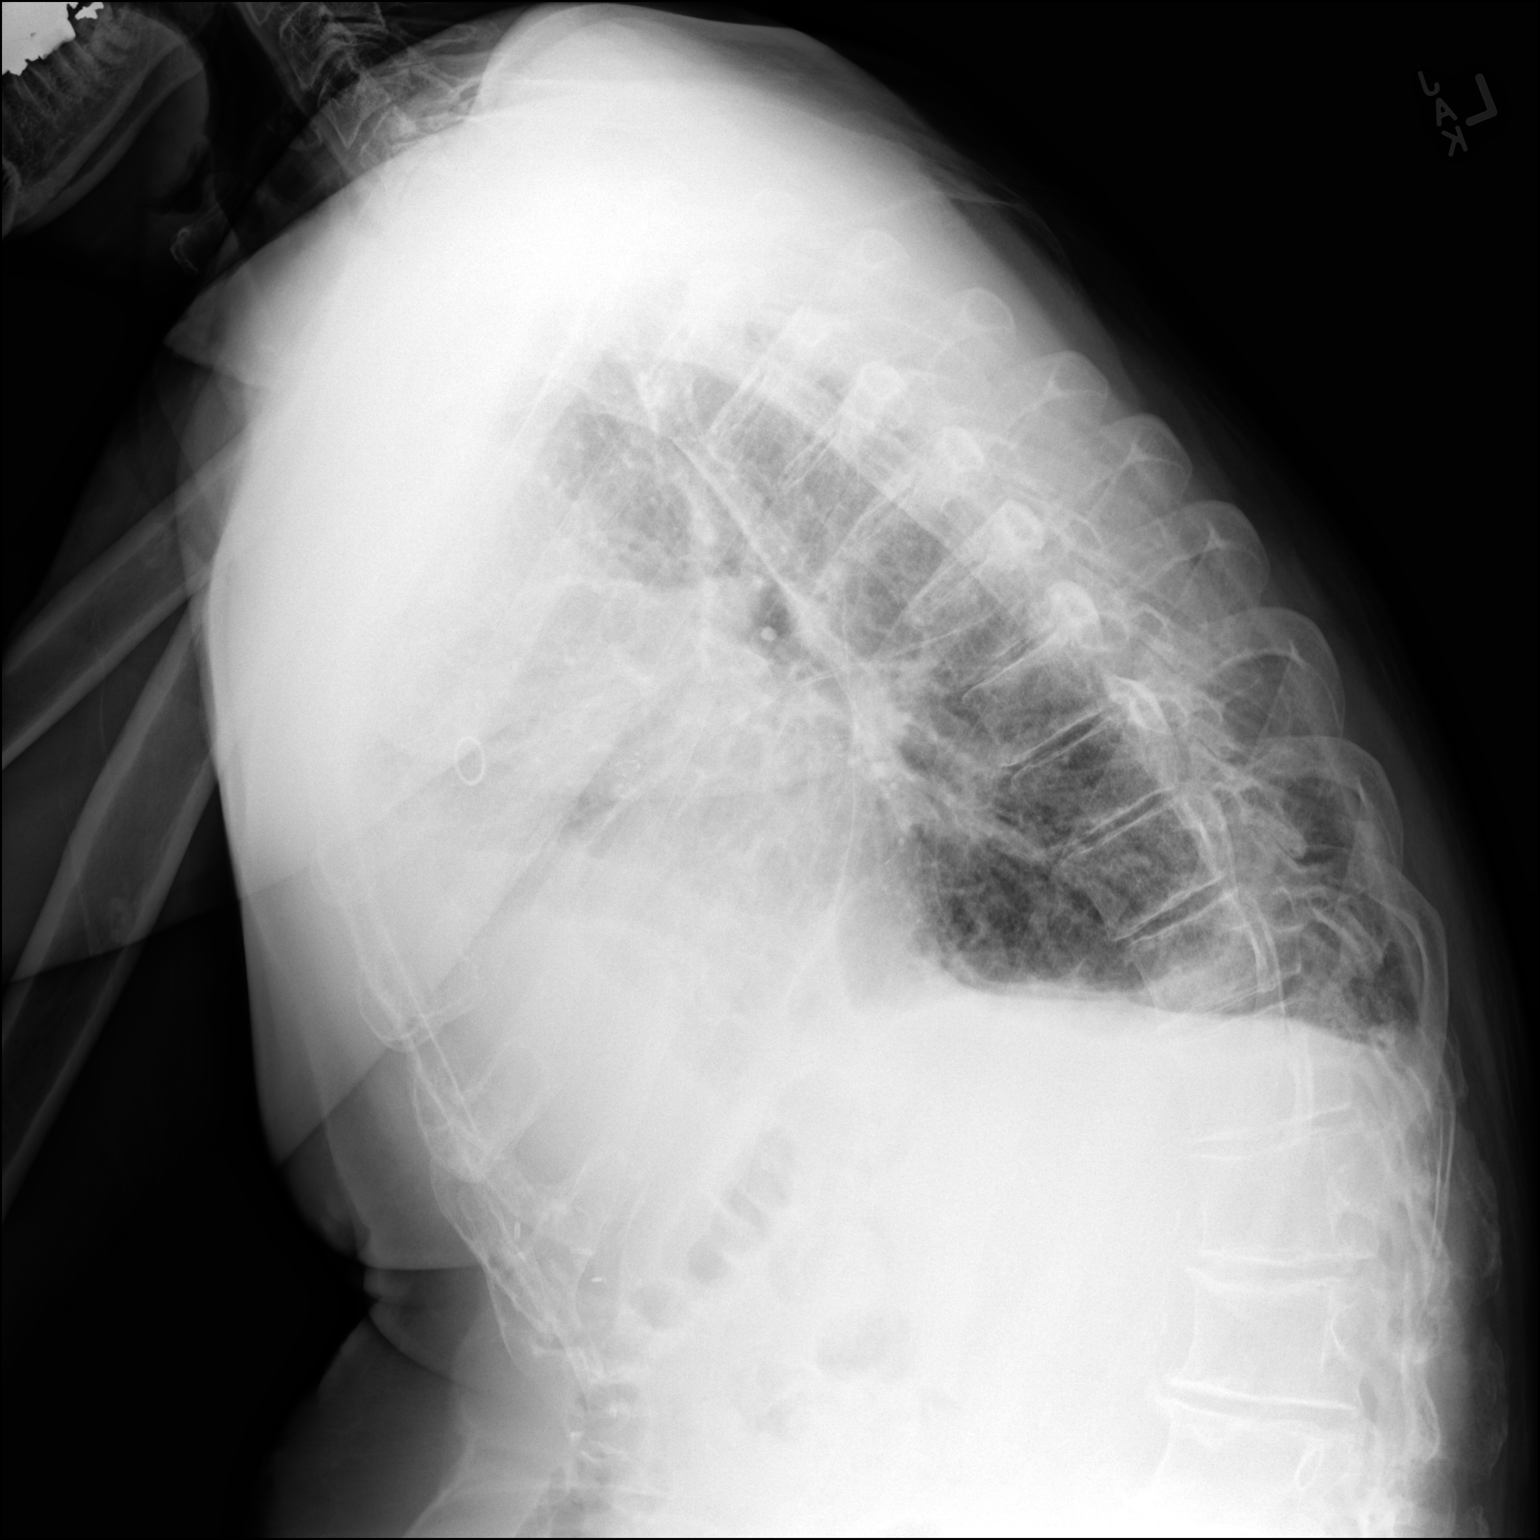

[2 of 2 positions shown; findings below may reference images not displayed]

FINDINGS: Interval removal of the right internal jugular sheath. Unchanged
mild cardiomegaly status post CABG. Unchanged small left and trace
right pleural effusions. Unchanged mild bibasilar atelectasis. No
consolidation or pneumothorax. No acute osseous abnormality.
IMPRESSION: Unchanged small left and trace right pleural effusions with
bibasilar atelectasis.

## 2020-08-28 ENCOUNTER — Other Ambulatory Visit: Payer: Self-pay | Admitting: Cardiology

## 2020-08-29 ENCOUNTER — Other Ambulatory Visit: Payer: Self-pay | Admitting: Cardiology

## 2020-08-29 NOTE — Telephone Encounter (Signed)
Refill sent to pharmacy.   

## 2020-08-30 ENCOUNTER — Other Ambulatory Visit: Payer: Self-pay

## 2020-08-31 ENCOUNTER — Other Ambulatory Visit: Payer: Self-pay

## 2020-08-31 ENCOUNTER — Telehealth (HOSPITAL_COMMUNITY): Payer: Self-pay | Admitting: *Deleted

## 2020-08-31 ENCOUNTER — Encounter: Payer: Self-pay | Admitting: Cardiology

## 2020-08-31 ENCOUNTER — Ambulatory Visit: Payer: PPO | Admitting: Cardiology

## 2020-08-31 VITALS — BP 118/62 | HR 58 | Ht 70.0 in | Wt 187.0 lb

## 2020-08-31 DIAGNOSIS — Z951 Presence of aortocoronary bypass graft: Secondary | ICD-10-CM | POA: Diagnosis not present

## 2020-08-31 DIAGNOSIS — I480 Paroxysmal atrial fibrillation: Secondary | ICD-10-CM

## 2020-08-31 DIAGNOSIS — I10 Essential (primary) hypertension: Secondary | ICD-10-CM

## 2020-08-31 DIAGNOSIS — E785 Hyperlipidemia, unspecified: Secondary | ICD-10-CM

## 2020-08-31 DIAGNOSIS — I25118 Atherosclerotic heart disease of native coronary artery with other forms of angina pectoris: Secondary | ICD-10-CM

## 2020-08-31 MED ORDER — ISOSORBIDE MONONITRATE ER 30 MG PO TB24: 30.0000 mg | ORAL_TABLET | Freq: Every day | ORAL | 1 refills | Status: DC

## 2020-08-31 NOTE — Progress Notes (Signed)
Cardiology Office Note:    Date:  08/31/2020   ID:  Shane Massey, DOB [birth year], MRN [id number]  PCP:  Maryella Shivers, MD  Cardiologist:  Jenne Campus, MD    Referring MD: Maryella Shivers, MD   Chief Complaint  Patient presents with  . Chest Pain    History of Present Illness:    Shane Massey is a 80 y.o. male  with past medical history significant for coronary artery disease.  In March 2017 he required PTCA and stenting of the mid LAD he also have history of ischemic cardiomyopathy however ejection fraction determined in January 2020 was normal.  He been complaining of fatigue tiredness for long period time eventually we decided to pursue cardiac catheterization.  That showed 60 to 75% stenosis of left main, 80% stenosis of ramus intermedius, 50% stenosis first diagonal branch on 08/13/2019 he ended up having coronary bypass graft with LIMA to LAD, left radial to second obtuse marginal branch and RIMA to ramus intermedius at the same time he did have PFO closure.  Recovery was complicated by an episode of atrial fibrillation successfully managed with amiodarone/anticoagulation, at the time of hospital living his ejection fraction was 45%.  Ongoing complaint is shortness of breath.  He does have some fine crackles on physical examination indicating possibility of pulmonary fibrosis.  He was referred to pulmonary.  Latest CT that I am reviewing done on 03/14/2020 showing presence of fibrotic changes within his lungs Comes today to our office with chief complaint of chest pain.  For about 2 weeks he experiencing sharp stabbing sensation located in the middle of the chest that is not related to exercise.  Usually last for few seconds to minutes.  He tried nitroglycerin once with relief.  Denies having any more shortness of breath.  He still trying to be active and works a lot he does not typically have this pain when he is working but happen many times at rest.  Past Medical History:   Diagnosis Date  . Angina pectoris ([place]) 09/06/2015  . CAD (coronary artery disease) 08/12/2019  . Coronary artery disease   . Dizziness 06/14/2017  . Dyslipidemia 09/06/2015  . Dyspnea   . Dyspnea on exertion 09/06/2015  . Dysrhythmia   . Exertional dyspnea 09/06/2015  . GERD (gastroesophageal reflux disease)   . Hypertension   . Hypothyroidism   . Idiopathic pulmonary fibrosis ([place]) 04/07/2020  . Ischemic cardiomyopathy 10/24/2016   Overview:  Ejection fraction 45% in spring 2018  . Myocardial infarction ([place])   . Near syncope   . Nonsustained ventricular tachycardia ([place]) 07/17/2018  . OSA (obstructive sleep apnea)   . OSA on CPAP 11/14/2015   CPAP 13   . Paroxysmal atrial fibrillation ([place]) 09/09/2019  . Restless leg syndrome 11/30/2016  . 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 CATARACT EXTRACTION  2012  . COLONOSCOPY  2013  . CORONARY ARTERY BYPASS GRAFT N/A 08/13/2019   Procedure: CORONARY ARTERY BYPASS GRAFTING (CABG) TIMES 3.;  Surgeon: Wonda Olds, MD;  Location: [place] [facility];  Service: Open Heart Surgery;  Laterality: N/A;  . CORONARY STENT PLACEMENT    . heart stent Left 09/2015  . HERNIA REPAIR  11/1977  . LEFT HEART CATH AND CORONARY ANGIOGRAPHY N/A 08/06/2019   Procedure: LEFT HEART CATH AND CORONARY ANGIOGRAPHY;  Surgeon: Troy Sine, MD;  Location: [place];  Service: Cardiovascular;  Laterality: N/A;  . RADIAL  ARTERY HARVEST Left 08/13/2019   Procedure: OPEN RADIAL ARTERY HARVEST;  Surgeon: Wonda Olds, MD;  Location: [facility];  Service: Open Heart Surgery;  Laterality: Left;  . REPAIR OF PATENT FORAMEN OVALE N/A 08/13/2019   Procedure: CLOSURE OF PATENT FORAMEN OVALE;  Surgeon: Wonda Olds, MD;  Location: [facility];  Service: Open Heart Surgery;  Laterality: N/A;  . SURGERY FOR BOWEL ABSCESS  10/1959  . TEE WITHOUT CARDIOVERSION N/A 08/13/2019   Procedure: TRANSESOPHAGEAL ECHOCARDIOGRAM (TEE);  Surgeon: Wonda Olds,  MD;  Location: [facility];  Service: Open Heart Surgery;  Laterality: N/A;    Current Medications: Current Meds  Medication Sig  . ascorbic acid (VITAMIN C) 500 MG tablet Take 500 mg by mouth daily as needed (ocassionally).  Marland Kitchen aspirin EC 81 MG tablet Take 81 mg by mouth daily.  . Calcium Carb-Cholecalciferol (CALCIUM 600+D3 PO) Take 1 tablet by mouth daily at 12 noon.  . clopidogrel (PLAVIX) 75 MG tablet Take 1 tablet (75 mg total) by mouth daily.  Marland Kitchen dexlansoprazole (DEXILANT) 60 MG capsule Take 60 mg by mouth daily before breakfast.  . fluticasone furoate-vilanterol (BREO ELLIPTA) 200-25 MCG/INH AEPB Inhale 1 puff into the lungs daily. Rinse out your mouth after every use.  . furosemide (LASIX) 40 MG tablet TAKE 1 & 1/2 (ONE & ONE-HALF) TABLETS BY MOUTH ONCE DAILY  . Glycerin-Hypromellose-PEG 400 (DRY EYE RELIEF DROPS) 0.2-0.2-1 % SOLN Place 1 drop into both eyes 3 (three) times daily as needed (dry/irritated eyes.).  Marland Kitchen levothyroxine (SYNTHROID, LEVOTHROID) 50 MCG tablet Take 50 mcg by mouth daily before breakfast.  . lisinopril (ZESTRIL) 2.5 MG tablet Take 1 tablet by mouth daily.  Marland Kitchen losartan (COZAAR) 25 MG tablet TAKE 1/2 TABLET BY MOUTH ONCE DAILY  . metoprolol succinate (TOPROL-XL) 25 MG 24 hr tablet TAKE 1 TABLET BY MOUTH ONCE DAILY TAKE WITH OR IMMEDIATELY FOLLOWING A MEAL.  . Multiple Vitamin (MULTIVITAMIN WITH MINERALS) TABS tablet Take 1 tablet by mouth daily at 12 noon.  . nitroGLYCERIN (NITROSTAT) 0.4 MG SL tablet Place 1 tablet (0.4 mg total) under the tongue every 5 (five) minutes as needed for chest pain.  . potassium chloride (KLOR-CON) 10 MEQ tablet Take 1 tablet by mouth once daily  . pramipexole (MIRAPEX) 1.5 MG tablet Take 1.5 mg by mouth at bedtime.   . rosuvastatin (CRESTOR) 20 MG tablet Take 1 tablet by mouth once daily  . [DISCONTINUED] levothyroxine (SYNTHROID) 75 MCG tablet Take 75 mcg by mouth daily.     Allergies:   Patient has no known allergies.   Social History    Socioeconomic History  . Marital status: Married    Spouse name: Not on file  . Number of children: Not on file  . Years of education: Not on file  . Highest education level: Not on file  Occupational History  . Not on file  Tobacco Use  . Smoking status: Former Smoker    Packs/day: 0.50    Years: 4.00    Pack years: 2.00    Types: Cigarettes    Quit date: 06/11/1958    Years since quitting: 62.2  . Smokeless tobacco: Former Systems developer    Types: Chew    Quit date: 12/01/1958  Vaping Use  . Vaping Use: Never used  Substance and Sexual Activity  . Alcohol use: No    Alcohol/week: 0.0 standard drinks  . Drug use: No  . Sexual activity: Not on file  Other Topics Concern  . Not on  file  Social History Narrative  . Not on file   Social Determinants of Health   Financial Resource Strain: Not on file  Food Insecurity: Not on file  Transportation Needs: Not on file  Physical Activity: Not on file  Stress: Not on file  Social Connections: Not on file     Family History: The patient's family history includes Asthma in his brother and maternal grandmother; Heart attack in his brother; Heart disease in his brother; Stroke in his father. ROS:   Please see the history of present illness.    All 14 point review of systems negative except as described per history of present illness  EKGs/Labs/Other Studies Reviewed:      Recent Labs: 10/13/2019: ALT 12; Hemoglobin 11.2; Platelets 232; TSH 3.590 04/12/2020: BUN 15; Creatinine, Ser 0.86; NT-Pro BNP 175; Potassium 4.5; Sodium 141  Recent Lipid Panel    Component Value Date/Time   CHOL 128 04/12/2020 0913   TRIG 111 04/12/2020 0913   HDL 48 04/12/2020 0913   CHOLHDL 2.7 04/12/2020 0913   CHOLHDL 2.5 08/13/2019 0603   VLDL 25 08/13/2019 0603   LDLCALC 60 04/12/2020 0913    Physical Exam:    VS:  BP 118/62 (BP Location: Left Arm, Patient Position: Sitting)   Pulse (!) 58   Ht 5\' 10"  (1.778 m)   Wt 187 lb (84.8 kg)   SpO2 94%    BMI 26.83 kg/m     Wt Readings from Last 3 Encounters:  08/31/20 187 lb (84.8 kg)  05/09/20 191 lb 6.4 oz (86.8 kg)  04/07/20 189 lb 3.2 oz (85.8 kg)     GEN:  Well nourished, well developed in no acute distress HEENT: Normal NECK: No JVD; No carotid bruits LYMPHATICS: No lymphadenopathy CARDIAC: RRR, no murmurs, no rubs, no gallops RESPIRATORY:  Clear to auscultation without rales, wheezing or rhonchi  ABDOMEN: Soft, non-tender, non-distended MUSCULOSKELETAL:  No edema; No deformity  SKIN: Warm and dry LOWER EXTREMITIES: no swelling NEUROLOGIC:  Alert and oriented x 3 PSYCHIATRIC:  Normal affect   ASSESSMENT:    1. Coronary artery disease involving native coronary artery of native heart with other form of angina pectoris (HCC)   2. Paroxysmal atrial fibrillation ([place])   3. Primary hypertension   4. S/P CABG x 3   5. Dyslipidemia    PLAN:    In order of problems listed above:  1. Coronary disease status post coronary bypass graft.  He does have some pain with some atypical characteristic.  His EKG did not show any acute changes I will do troponin I today also will do a D-dimer.  Will schedule him to have Lexiscan, also will start Imdur 30. 2. Paroxysmal atrial fibrillation, denies having any episodes of palpitations.  He had episode of atrial fibrillation only after bypass surgery.  Not anticoagulated. 3. Essential hypertension, blood pressure is controlled I will continue present management. 4. Dyslipidemia I did review his K PN LDL 60 HDL 48 this is from April 12, 2020.  Continue present management.   Medication Adjustments/Labs and Tests Ordered: Current medicines are reviewed at length with the patient today.  Concerns regarding medicines are outlined above.  Orders Placed This Encounter  Procedures  . EKG 12-Lead   Medication changes: No orders of the defined types were placed in this encounter.   Signed, Park Liter, MD, [facility] 08/31/2020 8:47 AM     [place]

## 2020-08-31 NOTE — Addendum Note (Signed)
Addended by: Senaida Ores on: 08/31/2020 08:52 AM   Modules accepted: Orders

## 2020-08-31 NOTE — Patient Instructions (Signed)
Medication Instructions:  Your physician has recommended you make the following change in your medication:   START: IMDUR 30 mg daily   *If you need a refill on your cardiac medications before your next appointment, please call your pharmacy*   Lab Work: Your physician recommends that you return for lab work today: troponin, ddimer  If you have labs (blood work) drawn today and your tests are completely normal, you will receive your results only by: Marland Kitchen MyChart Message (if you have MyChart) OR . A paper copy in the mail If you have any lab test that is abnormal or we need to change your treatment, we will call you to review the results.   Testing/Procedures:   [facility] [address] [place], [place] [address] Phone:  [phone]    Please arrive 15 minutes prior to your appointment time for registration and insurance purposes.  The test will take approximately 3 to 4 hours to complete; you may bring reading material.  If someone comes with you to your appointment, they will need to remain in the main lobby due to limited space in the testing area. **If you are pregnant or breastfeeding, please notify the nuclear lab prior to your appointment**  How to prepare for your Myocardial Perfusion Test: . Do not eat or drink 3 hours prior to your test, except you may have water. . Do not consume products containing caffeine (regular or decaffeinated) 12 hours prior to your test. (ex: coffee, chocolate, sodas, tea). . Do bring a list of your current medications with you.  If not listed below, you may take your medications as normal.  . Do wear comfortable clothes (no dresses or overalls) and walking shoes, tennis shoes preferred (No heels or open toe shoes are allowed). . Do NOT wear cologne, perfume, aftershave, or lotions (deodorant is allowed). . If these instructions are not followed, your test will have to be rescheduled.  Please report to [address] for your test.  If you have questions or concerns about your appointment, you can call the [facility] at [phone].  If you cannot keep your appointment, please provide 24 hours notification to the Nuclear Lab, to avoid a possible $50 charge to your account.    Follow-Up: At [facility], you and your health needs are our priority.  As part of our continuing mission to provide you with exceptional heart care, we have created designated Provider Care Teams.  These Care Teams include your primary Cardiologist (physician) and Advanced Practice Providers (APPs -  Physician Assistants and Nurse Practitioners) who all work together to provide you with the care you need, when you need it.  We recommend signing up for the patient portal called "MyChart".  Sign up information is provided on this After Visit Summary.  MyChart is used to connect with patients for Virtual Visits (Telemedicine).  Patients are able to view lab/test results, encounter notes, upcoming appointments, etc.  Non-urgent messages can be sent to your provider as well.   To learn more about what you can do with MyChart, go to [email or web address].    Your next appointment:   3 month(s)  The format for your next appointment:   In Person  Provider:   Jenne Campus, MD   Other Instructions  Isosorbide Mononitrate extended-release tablets What is this medicine? ISOSORBIDE MONONITRATE (eye soe SOR bide mon oh NYE trate) is a vasodilator. It relaxes blood vessels, increasing the blood and  oxygen supply to your heart. This medicine is used to prevent chest pain caused by angina. It will not help to stop an episode of chest pain. This medicine may be used for other purposes; ask your health care provider or pharmacist if you have questions. COMMON BRAND NAME(S): Imdur, Isotrate ER What should I tell my health care provider before I take this medicine? They need to know if you have  any of these conditions:  previous heart attack or heart failure  an unusual or allergic reaction to isosorbide mononitrate, nitrates, other medicines, foods, dyes, or preservatives  pregnant or trying to get pregnant  breast-feeding How should I use this medicine? Take this medicine by mouth with a glass of water. Follow the directions on the prescription label. Do not crush or chew. Take your medicine at regular intervals. Do not take your medicine more often than directed. Do not stop taking this medicine except on the advice of your doctor or health care professional. Talk to your pediatrician regarding the use of this medicine in children. Special care may be needed. Overdosage: If you think you have taken too much of this medicine contact a poison control center or emergency room at once. NOTE: This medicine is only for you. Do not share this medicine with others. What if I miss a dose? If you miss a dose, take it as soon as you can. If it is almost time for your next dose, take only that dose. Do not take double or extra doses. What may interact with this medicine? Do not take this medicine with any of the following medications:  medicines used to treat erectile dysfunction (ED) like avanafil, sildenafil, tadalafil, and vardenafil  riociguat This medicine may also interact with the following medications:  medicines for high blood pressure  other medicines for angina or heart failure This list may not describe all possible interactions. Give your health care provider a list of all the medicines, herbs, non-prescription drugs, or dietary supplements you use. Also tell them if you smoke, drink alcohol, or use illegal drugs. Some items may interact with your medicine. What should I watch for while using this medicine? Check your heart rate and blood pressure regularly while you are taking this medicine. Ask your doctor or health care professional what your heart rate and blood  pressure should be and when you should contact him or her. Tell your doctor or health care professional if you feel your medicine is no longer working. You may get dizzy. Do not drive, use machinery, or do anything that needs mental alertness until you know how this medicine affects you. To reduce the risk of dizzy or fainting spells, do not sit or stand up quickly, especially if you are an older patient. Alcohol can make you more dizzy, and increase flushing and rapid heartbeats. Avoid alcoholic drinks. Do not treat yourself for coughs, colds, or pain while you are taking this medicine without asking your doctor or health care professional for advice. Some ingredients may increase your blood pressure. What side effects may I notice from receiving this medicine? Side effects that you should report to your doctor or health care professional as soon as possible:  bluish discoloration of lips, fingernails, or palms of hands  irregular heartbeat, palpitations  low blood pressure  nausea, vomiting  persistent headache  unusually weak or tired Side effects that usually do not require medical attention (report to your doctor or health care professional if they continue or are bothersome):  flushing of the face or neck  rash This list may not describe all possible side effects. Call your doctor for medical advice about side effects. You may report side effects to FDA at 1-800-FDA-1088. Where should I keep my medicine? Keep out of the reach of children. Store between 15 and 30 degrees C (59 and 86 degrees F). Keep container tightly closed. Throw away any unused medicine after the expiration date. NOTE: This sheet is a summary. It may not cover all possible information. If you have questions about this medicine, talk to your doctor, pharmacist, or health care provider.  2021 Elsevier/Gold Standard (2013-03-27 14:48:19)   Cardiac Nuclear Scan A cardiac nuclear scan is a test that measures blood  flow to the heart when a person is resting and when he or she is exercising. The test looks for problems such as:  Not enough blood reaching a portion of the heart.  The heart muscle not working normally. You may need this test if:  You have heart disease.  You have had abnormal lab results.  You have had heart surgery or a balloon procedure to open up blocked arteries (angioplasty).  You have chest pain.  You have shortness of breath. In this test, a radioactive dye (tracer) is injected into your bloodstream. After the tracer has traveled to your heart, an imaging device is used to measure how much of the tracer is absorbed by or distributed to various areas of your heart. This procedure is usually done at a hospital and takes 2-4 hours. Tell a health care provider about:  Any allergies you have.  All medicines you are taking, including vitamins, herbs, eye drops, creams, and over-the-counter medicines.  Any problems you or family members have had with anesthetic medicines.  Any blood disorders you have.  Any surgeries you have had.  Any medical conditions you have.  Whether you are pregnant or may be pregnant. What are the risks? Generally, this is a safe procedure. However, problems may occur, including:  Serious chest pain and heart attack. This is only a risk if the stress portion of the test is done.  Rapid heartbeat.  Sensation of warmth in your chest. This usually passes quickly.  Allergic reaction to the tracer. What happens before the procedure?  Ask your health care provider about changing or stopping your regular medicines. This is especially important if you are taking diabetes medicines or blood thinners.  Follow instructions from your health care provider about eating or drinking restrictions.  Remove your jewelry on the day of the procedure. What happens during the procedure?  An IV will be inserted into one of your veins.  Your health care  provider will inject a small amount of radioactive tracer through the IV.  You will wait for 20-40 minutes while the tracer travels through your bloodstream.  Your heart activity will be monitored with an electrocardiogram (ECG).  You will lie down on an exam table.  Images of your heart will be taken for about 15-20 minutes.  You may also have a stress test. For this test, one of the following may be done: ? You will exercise on a treadmill or stationary bike. While you exercise, your heart's activity will be monitored with an ECG, and your blood pressure will be checked. ? You will be given medicines that will increase blood flow to parts of your heart. This is done if you are unable to exercise.  When blood flow to your heart has peaked, a  tracer will again be injected through the IV.  After 20-40 minutes, you will get back on the exam table and have more images taken of your heart.  Depending on the type of tracer used, scans may need to be repeated 3-4 hours later.  Your IV line will be removed when the procedure is over. The procedure may vary among health care providers and hospitals. What happens after the procedure?  Unless your health care provider tells you otherwise, you may return to your normal schedule, including diet, activities, and medicines.  Unless your health care provider tells you otherwise, you may increase your fluid intake. This will help to flush the contrast dye from your body. Drink enough fluid to keep your urine pale yellow.  Ask your health care provider, or the department that is doing the test: ? When will my results be ready? ? How will I get my results? Summary  A cardiac nuclear scan measures the blood flow to the heart when a person is resting and when he or she is exercising.  Tell your health care provider if you are pregnant.  Before the procedure, ask your health care provider about changing or stopping your regular medicines. This is  especially important if you are taking diabetes medicines or blood thinners.  After the procedure, unless your health care provider tells you otherwise, increase your fluid intake. This will help flush the contrast dye from your body.  After the procedure, unless your health care provider tells you otherwise, you may return to your normal schedule, including diet, activities, and medicines. This information is not intended to replace advice given to you by your health care provider. Make sure you discuss any questions you have with your health care provider. Document Revised: 11/11/2017 Document Reviewed: 11/11/2017 Elsevier Patient Education  [place].

## 2020-08-31 NOTE — Telephone Encounter (Signed)
Patient given detailed instructions per Myocardial Perfusion Study Information Sheet for the test on 09/01/20. Patient notified to arrive 15 minutes early and that it is imperative to arrive on time for appointment to keep from having the test rescheduled.  If you need to cancel or reschedule your appointment, please call the office within 24 hours of your appointment. . Patient verbalized understanding. Kirstie Peri

## 2020-09-01 ENCOUNTER — Telehealth: Payer: Self-pay | Admitting: Emergency Medicine

## 2020-09-01 ENCOUNTER — Encounter: Payer: Self-pay | Admitting: Cardiology

## 2020-09-01 ENCOUNTER — Ambulatory Visit (INDEPENDENT_AMBULATORY_CARE_PROVIDER_SITE_OTHER): Payer: PPO

## 2020-09-01 DIAGNOSIS — I480 Paroxysmal atrial fibrillation: Secondary | ICD-10-CM | POA: Diagnosis not present

## 2020-09-01 DIAGNOSIS — E785 Hyperlipidemia, unspecified: Secondary | ICD-10-CM

## 2020-09-01 DIAGNOSIS — I25118 Atherosclerotic heart disease of native coronary artery with other forms of angina pectoris: Secondary | ICD-10-CM

## 2020-09-01 DIAGNOSIS — I10 Essential (primary) hypertension: Secondary | ICD-10-CM | POA: Diagnosis not present

## 2020-09-01 DIAGNOSIS — Z951 Presence of aortocoronary bypass graft: Secondary | ICD-10-CM | POA: Diagnosis not present

## 2020-09-01 DIAGNOSIS — R778 Other specified abnormalities of plasma proteins: Secondary | ICD-10-CM

## 2020-09-01 DIAGNOSIS — I517 Cardiomegaly: Secondary | ICD-10-CM | POA: Diagnosis not present

## 2020-09-01 DIAGNOSIS — R7989 Other specified abnormal findings of blood chemistry: Secondary | ICD-10-CM

## 2020-09-01 DIAGNOSIS — R079 Chest pain, unspecified: Secondary | ICD-10-CM

## 2020-09-01 DIAGNOSIS — R0602 Shortness of breath: Secondary | ICD-10-CM | POA: Diagnosis not present

## 2020-09-01 LAB — BASIC METABOLIC PANEL
BUN/Creatinine Ratio: 22 (ref 10–24)
BUN: 18 mg/dL (ref 8–27)
CO2: 24 mmol/L (ref 20–29)
Calcium: 9.4 mg/dL (ref 8.6–10.2)
Chloride: 105 mmol/L (ref 96–106)
Creatinine, Ser: 0.81 mg/dL (ref 0.76–1.27)
Glucose: 110 mg/dL — ABNORMAL HIGH (ref 65–99)
Potassium: 4.2 mmol/L (ref 3.5–5.2)
Sodium: 139 mmol/L (ref 134–144)
eGFR: 91 mL/min/{1.73_m2} (ref >59)

## 2020-09-01 LAB — D-DIMER, QUANTITATIVE: D-DIMER: 0.71 mg/L FEU — ABNORMAL HIGH (ref 0.00–0.49)

## 2020-09-01 LAB — MYOCARDIAL PERFUSION IMAGING
LV dias vol: 155 mL (ref 62–150)
LV sys vol: 80 mL
Peak HR: 71 {beats}/min
Rest HR: 48 {beats}/min
SDS: 1
SRS: 1
SSS: 2
TID: 1.03

## 2020-09-01 LAB — TROPONIN T
Troponin T (Highly Sensitive): 43 ng/L (ref 0–22)
Troponin T (Highly Sensitive): 37 ng/L (ref 0–22)

## 2020-09-01 MED ORDER — TECHNETIUM TC 99M TETROFOSMIN IV KIT
10.8000 | PACK | Freq: Once | INTRAVENOUS | Status: AC | PRN
  Administered 2020-09-01: 10.8 via INTRAVENOUS

## 2020-09-01 MED ORDER — TECHNETIUM TC 99M TETROFOSMIN IV KIT
32.2000 | PACK | Freq: Once | INTRAVENOUS | Status: AC | PRN
  Administered 2020-09-01: 32.2 via INTRAVENOUS

## 2020-09-01 MED ORDER — REGADENOSON 0.4 MG/5ML IV SOLN
0.4000 mg | Freq: Once | INTRAVENOUS | Status: AC
  Administered 2020-09-01: 0.4 mg via INTRAVENOUS

## 2020-09-01 NOTE — Telephone Encounter (Signed)
-----   Message from Park Liter, MD sent at 09/01/2020 10:33 AM EDT ----- His D-dimer is elevated as well as troponin I.  Please run another troponin I today, he need to have chest CT to rule out PE as well.

## 2020-09-01 NOTE — Telephone Encounter (Signed)
Called and spoke to patient wife. Informed her of results and recommendations. She reports patient is in office today getting stress test. Spoke to patient informed him of results, had his labs redrawn today. Working on getting CT scheduled once insurance approves.

## 2020-09-02 NOTE — Telephone Encounter (Signed)
Patient called back asking for Hayley to discuss test results

## 2020-09-02 NOTE — Telephone Encounter (Signed)
Patient wife informed of results.

## 2020-09-09 DIAGNOSIS — M159 Polyosteoarthritis, unspecified: Secondary | ICD-10-CM | POA: Diagnosis not present

## 2020-09-09 DIAGNOSIS — I10 Essential (primary) hypertension: Secondary | ICD-10-CM | POA: Diagnosis not present

## 2020-09-09 DIAGNOSIS — I2510 Atherosclerotic heart disease of native coronary artery without angina pectoris: Secondary | ICD-10-CM | POA: Diagnosis not present

## 2020-09-09 DIAGNOSIS — E785 Hyperlipidemia, unspecified: Secondary | ICD-10-CM | POA: Diagnosis not present

## 2020-09-16 DIAGNOSIS — G4733 Obstructive sleep apnea (adult) (pediatric): Secondary | ICD-10-CM | POA: Diagnosis not present

## 2020-09-28 DIAGNOSIS — S40011A Contusion of right shoulder, initial encounter: Secondary | ICD-10-CM | POA: Diagnosis not present

## 2020-10-03 DIAGNOSIS — G4733 Obstructive sleep apnea (adult) (pediatric): Secondary | ICD-10-CM | POA: Diagnosis not present

## 2020-10-03 DIAGNOSIS — Z6828 Body mass index (BMI) 28.0-28.9, adult: Secondary | ICD-10-CM | POA: Diagnosis not present

## 2020-10-04 DIAGNOSIS — M25511 Pain in right shoulder: Secondary | ICD-10-CM | POA: Diagnosis not present

## 2020-10-06 DIAGNOSIS — M25511 Pain in right shoulder: Secondary | ICD-10-CM | POA: Diagnosis not present

## 2020-10-09 DIAGNOSIS — I2510 Atherosclerotic heart disease of native coronary artery without angina pectoris: Secondary | ICD-10-CM | POA: Diagnosis not present

## 2020-10-09 DIAGNOSIS — I10 Essential (primary) hypertension: Secondary | ICD-10-CM | POA: Diagnosis not present

## 2020-10-09 DIAGNOSIS — M159 Polyosteoarthritis, unspecified: Secondary | ICD-10-CM | POA: Diagnosis not present

## 2020-10-09 DIAGNOSIS — E785 Hyperlipidemia, unspecified: Secondary | ICD-10-CM | POA: Diagnosis not present

## 2020-10-10 DIAGNOSIS — S46011A Strain of muscle(s) and tendon(s) of the rotator cuff of right shoulder, initial encounter: Secondary | ICD-10-CM | POA: Diagnosis not present

## 2020-10-13 ENCOUNTER — Telehealth: Payer: Self-pay

## 2020-10-13 NOTE — Telephone Encounter (Signed)
   [facility] Pre-operative Risk Assessment    Request for surgical clearance:  1. What type of surgery is being performed? Right Arthroscopy Rotator Cuff Repair with Subacromial Decompression   2. When is this surgery scheduled? TBD   3. What type of clearance is required (medical clearance vs. Pharmacy clearance to hold med vs. Both)? Both  4. Are there any medications that need to be held prior to surgery and how long?None specified however the patient is on Asa 37m   5. Practice name and name of physician performing surgery? [facility]  6. What is your office phone number: [phone]   7.   What is your office fax number: [phone] 8.   Anesthesia type (None, local, MAC, general) ? General Anesthesia    JBasil DessPrevatt 10/13/2020, 11:23 AM  _________________________________________________________________   (provider comments below)

## 2020-10-14 ENCOUNTER — Other Ambulatory Visit: Payer: Self-pay | Admitting: Cardiothoracic Surgery

## 2020-10-14 NOTE — Telephone Encounter (Signed)
   Name: Shane Massey  DOB: Oct 31, 1940  MRN: [id number]   Primary Cardiologist: Jenne Campus, MD  Chart reviewed as part of pre-operative protocol coverage. Patient was contacted 10/14/2020 in reference to pre-operative risk assessment for pending surgery as outlined below.  Shane Massey was last seen on 08/31/20 by Dr. Agustin Cree.  Since that day, Shane Massey has done fine from a cardiac standpoint. He no longer had chest pain since starting imdur. He underwent a recent NST 08/2020 which was without ischemia..  Therefore, based on ACC/AHA guidelines, the patient would be at acceptable risk for the planned procedure without further cardiovascular testing.   The patient was advised that if he develops new symptoms prior to surgery to contact our office to arrange for a follow-up visit, and he verbalized understanding.  Of note, patient is on plavix 75mg  daily though looks like this medication fell off his med-rec. Okay to hold plavix 5 days prior to his upcoming shoulder surgery with plans to restart when cleared to do so by his orthopedist. Aspirin should be continued throughout the perioperative setting.   I will route this recommendation to the requesting party via Epic fax function and remove from pre-op pool. Please call with questions.  Abigail Butts, PA-C 10/14/2020, 9:02 AM

## 2020-10-16 DIAGNOSIS — G4733 Obstructive sleep apnea (adult) (pediatric): Secondary | ICD-10-CM | POA: Diagnosis not present

## 2020-10-17 ENCOUNTER — Other Ambulatory Visit: Payer: Self-pay

## 2020-10-17 DIAGNOSIS — G4733 Obstructive sleep apnea (adult) (pediatric): Secondary | ICD-10-CM | POA: Diagnosis not present

## 2020-10-17 MED ORDER — CLOPIDOGREL BISULFATE 75 MG PO TABS: 75.0000 mg | ORAL_TABLET | Freq: Every day | ORAL | 2 refills | Status: DC

## 2020-10-17 NOTE — Telephone Encounter (Signed)
Refill of Plavix 75 mg sent to [facility] in [place].

## 2020-10-24 DIAGNOSIS — G4733 Obstructive sleep apnea (adult) (pediatric): Secondary | ICD-10-CM | POA: Diagnosis not present

## 2020-10-24 DIAGNOSIS — Z6828 Body mass index (BMI) 28.0-28.9, adult: Secondary | ICD-10-CM | POA: Diagnosis not present

## 2020-11-03 DIAGNOSIS — H9190 Unspecified hearing loss, unspecified ear: Secondary | ICD-10-CM | POA: Diagnosis not present

## 2020-11-03 DIAGNOSIS — M1990 Unspecified osteoarthritis, unspecified site: Secondary | ICD-10-CM | POA: Diagnosis not present

## 2020-11-03 DIAGNOSIS — Z7982 Long term (current) use of aspirin: Secondary | ICD-10-CM | POA: Diagnosis not present

## 2020-11-03 DIAGNOSIS — I2510 Atherosclerotic heart disease of native coronary artery without angina pectoris: Secondary | ICD-10-CM | POA: Diagnosis not present

## 2020-11-03 DIAGNOSIS — M7541 Impingement syndrome of right shoulder: Secondary | ICD-10-CM | POA: Diagnosis not present

## 2020-11-03 DIAGNOSIS — G8918 Other acute postprocedural pain: Secondary | ICD-10-CM | POA: Diagnosis not present

## 2020-11-03 DIAGNOSIS — H9193 Unspecified hearing loss, bilateral: Secondary | ICD-10-CM | POA: Diagnosis not present

## 2020-11-03 DIAGNOSIS — M24111 Other articular cartilage disorders, right shoulder: Secondary | ICD-10-CM | POA: Diagnosis not present

## 2020-11-03 DIAGNOSIS — I252 Old myocardial infarction: Secondary | ICD-10-CM | POA: Diagnosis not present

## 2020-11-03 DIAGNOSIS — S43491A Other sprain of right shoulder joint, initial encounter: Secondary | ICD-10-CM | POA: Diagnosis not present

## 2020-11-03 DIAGNOSIS — Z79899 Other long term (current) drug therapy: Secondary | ICD-10-CM | POA: Diagnosis not present

## 2020-11-03 DIAGNOSIS — Z7902 Long term (current) use of antithrombotics/antiplatelets: Secondary | ICD-10-CM | POA: Diagnosis not present

## 2020-11-03 DIAGNOSIS — M7521 Bicipital tendinitis, right shoulder: Secondary | ICD-10-CM | POA: Diagnosis not present

## 2020-11-03 DIAGNOSIS — E039 Hypothyroidism, unspecified: Secondary | ICD-10-CM | POA: Diagnosis not present

## 2020-11-03 DIAGNOSIS — M75111 Incomplete rotator cuff tear or rupture of right shoulder, not specified as traumatic: Secondary | ICD-10-CM | POA: Diagnosis not present

## 2020-11-03 DIAGNOSIS — S46211A Strain of muscle, fascia and tendon of other parts of biceps, right arm, initial encounter: Secondary | ICD-10-CM | POA: Diagnosis not present

## 2020-11-03 DIAGNOSIS — M65811 Other synovitis and tenosynovitis, right shoulder: Secondary | ICD-10-CM | POA: Diagnosis not present

## 2020-11-03 DIAGNOSIS — M25511 Pain in right shoulder: Secondary | ICD-10-CM | POA: Diagnosis not present

## 2020-11-03 DIAGNOSIS — I10 Essential (primary) hypertension: Secondary | ICD-10-CM | POA: Diagnosis not present

## 2020-11-03 DIAGNOSIS — M7551 Bursitis of right shoulder: Secondary | ICD-10-CM | POA: Diagnosis not present

## 2020-11-03 DIAGNOSIS — N401 Enlarged prostate with lower urinary tract symptoms: Secondary | ICD-10-CM | POA: Diagnosis not present

## 2020-11-03 DIAGNOSIS — M19011 Primary osteoarthritis, right shoulder: Secondary | ICD-10-CM | POA: Diagnosis not present

## 2020-11-03 DIAGNOSIS — J45909 Unspecified asthma, uncomplicated: Secondary | ICD-10-CM | POA: Diagnosis not present

## 2020-11-03 DIAGNOSIS — Z87891 Personal history of nicotine dependence: Secondary | ICD-10-CM | POA: Diagnosis not present

## 2020-11-03 DIAGNOSIS — S46011A Strain of muscle(s) and tendon(s) of the rotator cuff of right shoulder, initial encounter: Secondary | ICD-10-CM | POA: Diagnosis not present

## 2020-11-03 DIAGNOSIS — M778 Other enthesopathies, not elsewhere classified: Secondary | ICD-10-CM | POA: Diagnosis not present

## 2020-11-03 HISTORY — PX: OTHER SURGICAL HISTORY: SHX169

## 2020-11-08 DIAGNOSIS — S46001A Unspecified injury of muscle(s) and tendon(s) of the rotator cuff of right shoulder, initial encounter: Secondary | ICD-10-CM | POA: Diagnosis not present

## 2020-11-08 DIAGNOSIS — M25511 Pain in right shoulder: Secondary | ICD-10-CM | POA: Diagnosis not present

## 2020-11-08 DIAGNOSIS — M62511 Muscle wasting and atrophy, not elsewhere classified, right shoulder: Secondary | ICD-10-CM | POA: Diagnosis not present

## 2020-11-09 DIAGNOSIS — I2510 Atherosclerotic heart disease of native coronary artery without angina pectoris: Secondary | ICD-10-CM | POA: Diagnosis not present

## 2020-11-09 DIAGNOSIS — E785 Hyperlipidemia, unspecified: Secondary | ICD-10-CM | POA: Diagnosis not present

## 2020-11-09 DIAGNOSIS — I10 Essential (primary) hypertension: Secondary | ICD-10-CM | POA: Diagnosis not present

## 2020-11-11 DIAGNOSIS — M62511 Muscle wasting and atrophy, not elsewhere classified, right shoulder: Secondary | ICD-10-CM | POA: Diagnosis not present

## 2020-11-11 DIAGNOSIS — S46001A Unspecified injury of muscle(s) and tendon(s) of the rotator cuff of right shoulder, initial encounter: Secondary | ICD-10-CM | POA: Diagnosis not present

## 2020-11-11 DIAGNOSIS — M25511 Pain in right shoulder: Secondary | ICD-10-CM | POA: Diagnosis not present

## 2020-11-14 DIAGNOSIS — S46001A Unspecified injury of muscle(s) and tendon(s) of the rotator cuff of right shoulder, initial encounter: Secondary | ICD-10-CM | POA: Diagnosis not present

## 2020-11-14 DIAGNOSIS — M25511 Pain in right shoulder: Secondary | ICD-10-CM | POA: Diagnosis not present

## 2020-11-14 DIAGNOSIS — M62511 Muscle wasting and atrophy, not elsewhere classified, right shoulder: Secondary | ICD-10-CM | POA: Diagnosis not present

## 2020-11-16 DIAGNOSIS — G4733 Obstructive sleep apnea (adult) (pediatric): Secondary | ICD-10-CM | POA: Diagnosis not present

## 2020-11-17 DIAGNOSIS — M25511 Pain in right shoulder: Secondary | ICD-10-CM | POA: Diagnosis not present

## 2020-11-17 DIAGNOSIS — M62511 Muscle wasting and atrophy, not elsewhere classified, right shoulder: Secondary | ICD-10-CM | POA: Diagnosis not present

## 2020-11-17 DIAGNOSIS — S46001A Unspecified injury of muscle(s) and tendon(s) of the rotator cuff of right shoulder, initial encounter: Secondary | ICD-10-CM | POA: Diagnosis not present

## 2020-11-18 DIAGNOSIS — H903 Sensorineural hearing loss, bilateral: Secondary | ICD-10-CM | POA: Diagnosis not present

## 2020-11-21 DIAGNOSIS — S46001A Unspecified injury of muscle(s) and tendon(s) of the rotator cuff of right shoulder, initial encounter: Secondary | ICD-10-CM | POA: Diagnosis not present

## 2020-11-21 DIAGNOSIS — M62511 Muscle wasting and atrophy, not elsewhere classified, right shoulder: Secondary | ICD-10-CM | POA: Diagnosis not present

## 2020-11-21 DIAGNOSIS — M25511 Pain in right shoulder: Secondary | ICD-10-CM | POA: Diagnosis not present

## 2020-11-23 DIAGNOSIS — G2581 Restless legs syndrome: Secondary | ICD-10-CM | POA: Diagnosis not present

## 2020-11-23 DIAGNOSIS — Z9989 Dependence on other enabling machines and devices: Secondary | ICD-10-CM | POA: Diagnosis not present

## 2020-11-23 DIAGNOSIS — I10 Essential (primary) hypertension: Secondary | ICD-10-CM | POA: Diagnosis not present

## 2020-11-23 DIAGNOSIS — I25708 Atherosclerosis of coronary artery bypass graft(s), unspecified, with other forms of angina pectoris: Secondary | ICD-10-CM | POA: Diagnosis not present

## 2020-11-23 DIAGNOSIS — G4733 Obstructive sleep apnea (adult) (pediatric): Secondary | ICD-10-CM | POA: Diagnosis not present

## 2020-11-23 DIAGNOSIS — G4734 Idiopathic sleep related nonobstructive alveolar hypoventilation: Secondary | ICD-10-CM | POA: Diagnosis not present

## 2020-11-24 DIAGNOSIS — M62511 Muscle wasting and atrophy, not elsewhere classified, right shoulder: Secondary | ICD-10-CM | POA: Diagnosis not present

## 2020-11-24 DIAGNOSIS — S46001A Unspecified injury of muscle(s) and tendon(s) of the rotator cuff of right shoulder, initial encounter: Secondary | ICD-10-CM | POA: Diagnosis not present

## 2020-11-24 DIAGNOSIS — M25511 Pain in right shoulder: Secondary | ICD-10-CM | POA: Diagnosis not present

## 2020-11-30 DIAGNOSIS — S46001A Unspecified injury of muscle(s) and tendon(s) of the rotator cuff of right shoulder, initial encounter: Secondary | ICD-10-CM | POA: Diagnosis not present

## 2020-11-30 DIAGNOSIS — M25511 Pain in right shoulder: Secondary | ICD-10-CM | POA: Diagnosis not present

## 2020-11-30 DIAGNOSIS — M62511 Muscle wasting and atrophy, not elsewhere classified, right shoulder: Secondary | ICD-10-CM | POA: Diagnosis not present

## 2020-12-06 ENCOUNTER — Other Ambulatory Visit: Payer: Self-pay | Admitting: Cardiology

## 2020-12-06 NOTE — Telephone Encounter (Signed)
Refill sent to pharmacy.   

## 2020-12-07 ENCOUNTER — Ambulatory Visit: Payer: PPO | Admitting: Cardiology

## 2020-12-09 DIAGNOSIS — I10 Essential (primary) hypertension: Secondary | ICD-10-CM | POA: Diagnosis not present

## 2020-12-09 DIAGNOSIS — H903 Sensorineural hearing loss, bilateral: Secondary | ICD-10-CM | POA: Diagnosis not present

## 2020-12-09 DIAGNOSIS — I2510 Atherosclerotic heart disease of native coronary artery without angina pectoris: Secondary | ICD-10-CM | POA: Diagnosis not present

## 2020-12-09 DIAGNOSIS — E785 Hyperlipidemia, unspecified: Secondary | ICD-10-CM | POA: Diagnosis not present

## 2020-12-15 DIAGNOSIS — M25511 Pain in right shoulder: Secondary | ICD-10-CM | POA: Diagnosis not present

## 2020-12-15 DIAGNOSIS — S46001A Unspecified injury of muscle(s) and tendon(s) of the rotator cuff of right shoulder, initial encounter: Secondary | ICD-10-CM | POA: Diagnosis not present

## 2020-12-15 DIAGNOSIS — M62511 Muscle wasting and atrophy, not elsewhere classified, right shoulder: Secondary | ICD-10-CM | POA: Diagnosis not present

## 2020-12-16 DIAGNOSIS — M25511 Pain in right shoulder: Secondary | ICD-10-CM | POA: Diagnosis not present

## 2020-12-16 DIAGNOSIS — G4733 Obstructive sleep apnea (adult) (pediatric): Secondary | ICD-10-CM | POA: Diagnosis not present

## 2020-12-20 DIAGNOSIS — S46001A Unspecified injury of muscle(s) and tendon(s) of the rotator cuff of right shoulder, initial encounter: Secondary | ICD-10-CM | POA: Diagnosis not present

## 2020-12-20 DIAGNOSIS — M25511 Pain in right shoulder: Secondary | ICD-10-CM | POA: Diagnosis not present

## 2020-12-20 DIAGNOSIS — M62511 Muscle wasting and atrophy, not elsewhere classified, right shoulder: Secondary | ICD-10-CM | POA: Diagnosis not present

## 2020-12-22 DIAGNOSIS — S46001A Unspecified injury of muscle(s) and tendon(s) of the rotator cuff of right shoulder, initial encounter: Secondary | ICD-10-CM | POA: Diagnosis not present

## 2020-12-22 DIAGNOSIS — M62511 Muscle wasting and atrophy, not elsewhere classified, right shoulder: Secondary | ICD-10-CM | POA: Diagnosis not present

## 2020-12-22 DIAGNOSIS — M25511 Pain in right shoulder: Secondary | ICD-10-CM | POA: Diagnosis not present

## 2020-12-23 ENCOUNTER — Other Ambulatory Visit (HOSPITAL_BASED_OUTPATIENT_CLINIC_OR_DEPARTMENT_OTHER): Payer: Self-pay

## 2020-12-23 DIAGNOSIS — G4733 Obstructive sleep apnea (adult) (pediatric): Secondary | ICD-10-CM

## 2020-12-23 DIAGNOSIS — G4734 Idiopathic sleep related nonobstructive alveolar hypoventilation: Secondary | ICD-10-CM

## 2020-12-27 DIAGNOSIS — M62511 Muscle wasting and atrophy, not elsewhere classified, right shoulder: Secondary | ICD-10-CM | POA: Diagnosis not present

## 2020-12-27 DIAGNOSIS — S46001A Unspecified injury of muscle(s) and tendon(s) of the rotator cuff of right shoulder, initial encounter: Secondary | ICD-10-CM | POA: Diagnosis not present

## 2020-12-27 DIAGNOSIS — M25511 Pain in right shoulder: Secondary | ICD-10-CM | POA: Diagnosis not present

## 2020-12-29 DIAGNOSIS — S46001A Unspecified injury of muscle(s) and tendon(s) of the rotator cuff of right shoulder, initial encounter: Secondary | ICD-10-CM | POA: Diagnosis not present

## 2020-12-29 DIAGNOSIS — M25511 Pain in right shoulder: Secondary | ICD-10-CM | POA: Diagnosis not present

## 2020-12-29 DIAGNOSIS — M62511 Muscle wasting and atrophy, not elsewhere classified, right shoulder: Secondary | ICD-10-CM | POA: Diagnosis not present

## 2021-01-03 DIAGNOSIS — M25511 Pain in right shoulder: Secondary | ICD-10-CM | POA: Diagnosis not present

## 2021-01-03 DIAGNOSIS — M62511 Muscle wasting and atrophy, not elsewhere classified, right shoulder: Secondary | ICD-10-CM | POA: Diagnosis not present

## 2021-01-03 DIAGNOSIS — S46001A Unspecified injury of muscle(s) and tendon(s) of the rotator cuff of right shoulder, initial encounter: Secondary | ICD-10-CM | POA: Diagnosis not present

## 2021-01-05 DIAGNOSIS — S46001A Unspecified injury of muscle(s) and tendon(s) of the rotator cuff of right shoulder, initial encounter: Secondary | ICD-10-CM | POA: Diagnosis not present

## 2021-01-05 DIAGNOSIS — M62511 Muscle wasting and atrophy, not elsewhere classified, right shoulder: Secondary | ICD-10-CM | POA: Diagnosis not present

## 2021-01-05 DIAGNOSIS — M25511 Pain in right shoulder: Secondary | ICD-10-CM | POA: Diagnosis not present

## 2021-01-06 ENCOUNTER — Other Ambulatory Visit: Payer: Self-pay | Admitting: Cardiology

## 2021-01-09 DIAGNOSIS — I2510 Atherosclerotic heart disease of native coronary artery without angina pectoris: Secondary | ICD-10-CM | POA: Diagnosis not present

## 2021-01-09 DIAGNOSIS — I10 Essential (primary) hypertension: Secondary | ICD-10-CM | POA: Diagnosis not present

## 2021-01-09 DIAGNOSIS — E785 Hyperlipidemia, unspecified: Secondary | ICD-10-CM | POA: Diagnosis not present

## 2021-01-10 ENCOUNTER — Other Ambulatory Visit: Payer: Self-pay | Admitting: Cardiology

## 2021-01-10 DIAGNOSIS — M25511 Pain in right shoulder: Secondary | ICD-10-CM | POA: Diagnosis not present

## 2021-01-10 DIAGNOSIS — M62511 Muscle wasting and atrophy, not elsewhere classified, right shoulder: Secondary | ICD-10-CM | POA: Diagnosis not present

## 2021-01-10 DIAGNOSIS — S46001A Unspecified injury of muscle(s) and tendon(s) of the rotator cuff of right shoulder, initial encounter: Secondary | ICD-10-CM | POA: Diagnosis not present

## 2021-01-12 DIAGNOSIS — M25511 Pain in right shoulder: Secondary | ICD-10-CM | POA: Diagnosis not present

## 2021-01-12 DIAGNOSIS — S46001A Unspecified injury of muscle(s) and tendon(s) of the rotator cuff of right shoulder, initial encounter: Secondary | ICD-10-CM | POA: Diagnosis not present

## 2021-01-12 DIAGNOSIS — M62511 Muscle wasting and atrophy, not elsewhere classified, right shoulder: Secondary | ICD-10-CM | POA: Diagnosis not present

## 2021-01-13 DIAGNOSIS — S46011A Strain of muscle(s) and tendon(s) of the rotator cuff of right shoulder, initial encounter: Secondary | ICD-10-CM | POA: Diagnosis not present

## 2021-01-16 DIAGNOSIS — G4733 Obstructive sleep apnea (adult) (pediatric): Secondary | ICD-10-CM | POA: Diagnosis not present

## 2021-01-16 DIAGNOSIS — R7303 Prediabetes: Secondary | ICD-10-CM | POA: Diagnosis not present

## 2021-01-16 DIAGNOSIS — E039 Hypothyroidism, unspecified: Secondary | ICD-10-CM | POA: Diagnosis not present

## 2021-01-16 DIAGNOSIS — E785 Hyperlipidemia, unspecified: Secondary | ICD-10-CM | POA: Diagnosis not present

## 2021-01-17 DIAGNOSIS — M62511 Muscle wasting and atrophy, not elsewhere classified, right shoulder: Secondary | ICD-10-CM | POA: Diagnosis not present

## 2021-01-17 DIAGNOSIS — S46001A Unspecified injury of muscle(s) and tendon(s) of the rotator cuff of right shoulder, initial encounter: Secondary | ICD-10-CM | POA: Diagnosis not present

## 2021-01-17 DIAGNOSIS — C44622 Squamous cell carcinoma of skin of right upper limb, including shoulder: Secondary | ICD-10-CM | POA: Diagnosis not present

## 2021-01-17 DIAGNOSIS — L578 Other skin changes due to chronic exposure to nonionizing radiation: Secondary | ICD-10-CM | POA: Diagnosis not present

## 2021-01-17 DIAGNOSIS — M25511 Pain in right shoulder: Secondary | ICD-10-CM | POA: Diagnosis not present

## 2021-01-17 DIAGNOSIS — L570 Actinic keratosis: Secondary | ICD-10-CM | POA: Diagnosis not present

## 2021-01-19 ENCOUNTER — Other Ambulatory Visit: Payer: Self-pay | Admitting: Cardiology

## 2021-01-19 DIAGNOSIS — M25511 Pain in right shoulder: Secondary | ICD-10-CM | POA: Diagnosis not present

## 2021-01-19 DIAGNOSIS — M62511 Muscle wasting and atrophy, not elsewhere classified, right shoulder: Secondary | ICD-10-CM | POA: Diagnosis not present

## 2021-01-19 DIAGNOSIS — S46001A Unspecified injury of muscle(s) and tendon(s) of the rotator cuff of right shoulder, initial encounter: Secondary | ICD-10-CM | POA: Diagnosis not present

## 2021-01-23 ENCOUNTER — Ambulatory Visit (HOSPITAL_BASED_OUTPATIENT_CLINIC_OR_DEPARTMENT_OTHER): Payer: PPO | Attending: Internal Medicine | Admitting: Internal Medicine

## 2021-01-23 ENCOUNTER — Other Ambulatory Visit: Payer: Self-pay

## 2021-01-23 DIAGNOSIS — G4733 Obstructive sleep apnea (adult) (pediatric): Secondary | ICD-10-CM

## 2021-01-23 DIAGNOSIS — E785 Hyperlipidemia, unspecified: Secondary | ICD-10-CM | POA: Diagnosis not present

## 2021-01-23 DIAGNOSIS — R7303 Prediabetes: Secondary | ICD-10-CM | POA: Diagnosis not present

## 2021-01-23 DIAGNOSIS — G4734 Idiopathic sleep related nonobstructive alveolar hypoventilation: Secondary | ICD-10-CM

## 2021-01-23 DIAGNOSIS — I10 Essential (primary) hypertension: Secondary | ICD-10-CM | POA: Diagnosis not present

## 2021-01-23 DIAGNOSIS — I2510 Atherosclerotic heart disease of native coronary artery without angina pectoris: Secondary | ICD-10-CM | POA: Diagnosis not present

## 2021-01-23 DIAGNOSIS — E039 Hypothyroidism, unspecified: Secondary | ICD-10-CM | POA: Diagnosis not present

## 2021-01-25 DIAGNOSIS — M25511 Pain in right shoulder: Secondary | ICD-10-CM | POA: Diagnosis not present

## 2021-01-25 DIAGNOSIS — S46001A Unspecified injury of muscle(s) and tendon(s) of the rotator cuff of right shoulder, initial encounter: Secondary | ICD-10-CM | POA: Diagnosis not present

## 2021-01-25 DIAGNOSIS — M62511 Muscle wasting and atrophy, not elsewhere classified, right shoulder: Secondary | ICD-10-CM | POA: Diagnosis not present

## 2021-02-01 DIAGNOSIS — M62511 Muscle wasting and atrophy, not elsewhere classified, right shoulder: Secondary | ICD-10-CM | POA: Diagnosis not present

## 2021-02-01 DIAGNOSIS — M25511 Pain in right shoulder: Secondary | ICD-10-CM | POA: Diagnosis not present

## 2021-02-01 DIAGNOSIS — S46001A Unspecified injury of muscle(s) and tendon(s) of the rotator cuff of right shoulder, initial encounter: Secondary | ICD-10-CM | POA: Diagnosis not present

## 2021-02-06 DIAGNOSIS — Z6827 Body mass index (BMI) 27.0-27.9, adult: Secondary | ICD-10-CM | POA: Diagnosis not present

## 2021-02-06 DIAGNOSIS — D692 Other nonthrombocytopenic purpura: Secondary | ICD-10-CM | POA: Diagnosis not present

## 2021-02-08 DIAGNOSIS — M62511 Muscle wasting and atrophy, not elsewhere classified, right shoulder: Secondary | ICD-10-CM | POA: Diagnosis not present

## 2021-02-08 DIAGNOSIS — S46001A Unspecified injury of muscle(s) and tendon(s) of the rotator cuff of right shoulder, initial encounter: Secondary | ICD-10-CM | POA: Diagnosis not present

## 2021-02-08 DIAGNOSIS — M25511 Pain in right shoulder: Secondary | ICD-10-CM | POA: Diagnosis not present

## 2021-02-09 DIAGNOSIS — I10 Essential (primary) hypertension: Secondary | ICD-10-CM | POA: Diagnosis not present

## 2021-02-09 DIAGNOSIS — E785 Hyperlipidemia, unspecified: Secondary | ICD-10-CM | POA: Diagnosis not present

## 2021-02-09 DIAGNOSIS — I2510 Atherosclerotic heart disease of native coronary artery without angina pectoris: Secondary | ICD-10-CM | POA: Diagnosis not present

## 2021-02-13 DIAGNOSIS — S61011A Laceration without foreign body of right thumb without damage to nail, initial encounter: Secondary | ICD-10-CM | POA: Diagnosis not present

## 2021-02-15 DIAGNOSIS — M25511 Pain in right shoulder: Secondary | ICD-10-CM | POA: Diagnosis not present

## 2021-02-15 DIAGNOSIS — S46001A Unspecified injury of muscle(s) and tendon(s) of the rotator cuff of right shoulder, initial encounter: Secondary | ICD-10-CM | POA: Diagnosis not present

## 2021-02-15 DIAGNOSIS — M62511 Muscle wasting and atrophy, not elsewhere classified, right shoulder: Secondary | ICD-10-CM | POA: Diagnosis not present

## 2021-02-15 NOTE — Procedures (Signed)
   NAME: Shane Massey DATE OF BIRTH:  July 12, 1940 MEDICAL RECORD NUMBER [id number]  LOCATION: [facility]  PHYSICIAN: Marius Ditch  DATE OF STUDY: 01/23/2021  SLEEP STUDY TYPE: Positive Airway Pressure Titration               REFERRING PHYSICIAN: Marius Ditch, MD  EPWORTH SLEEPINESS SCORE:   HEIGHT: '5\' 9"'$  (175.3 cm) 15 WEIGHT: 183 lb (83 kg)    Body mass index is 27.02 kg/m.  NECK SIZE: 17 in.  CLINICAL INFORMATION The patient was referred to the sleep center for PAP titration. Patient has not been very successful with CPAP at home and he has persistent hypoxemia and inadequately controlled AHI.   MEDICATIONS Patient self administered medications include: dexilant, LEVOTHYROXINE, MIRAPEX, CRESTOR. No sleep medicine administered.Marland Kitchen  SLEEP STUDY TECHNIQUE The patient underwent an attended overnight polysomnography titration to assess the effects of cpap therapy. The following variables were monitored: EEG(C4-A1, C3-A2, O1-A2, O2-A1), EOG, submental and leg EMG, ECG, oxyhemoglobin saturation by pulse oximetry, thoracic and abdominal respiratory effort belts, nasal/oral airflow by pressure sensor, body position sensor and snoring sensor. CPAP pressure was titrated to eliminate apneas, hypopneas and oxygen desaturation.  TECHNICAL COMMENTS Comments added by Technician: pt had one restroom visit Comments added by Scorer: N/A  SLEEP ARCHITECTURE The study was initiated at 9:42:41 PM and terminated at 4:30:34 AM. Total recorded time was 407.9 minutes. EEG confirmed total sleep time was 294.5 minutes yielding a sleep efficiency of 72.2%. Sleep onset after lights out was 5.1 minutes with a REM latency of 186.5 minutes. The patient spent 11.38% of the night in stage N1 sleep, 65.03% in stage N2 sleep, 0.00% in stage N3 and 23.6% in REM. The Arousal Index was 33.6/hour.  RESPIRATORY PARAMETERS The overall AHI was 14.7 per hour, and the RDI was 22.4 events/hour with a  central apnea index of 0.8 events per hour. The patient was started on CPAP and pressures were increased to relieve events. Due to intolerance at higher pressures, he was converted to BPAP.  The most appropriate setting of PAP was IPAP/EPAP 20/16 cm H2O. At this setting, the sleep efficiency was 97%, the patient was supine for 100%,  the AHI was 0 events per hour, the RDI was 0 events/hour (with 0.8 central events), the arousal index was 11.1 per hour, and the oxygen nadir was 95.0%. He stated that his sleep was better in this study than it usually is at home.   LEG MOVEMENT DATA The total leg movements were with a resulting leg movement index of /hr. Associated arousal with leg movement index was 0.2/hr.  CARDIAC DATA The underlying cardiac rhythm was most consistent with sinus rhythm. Mean heart rate during sleep was 49.85 bpm. Additional rhythm abnormalities include None.  IMPRESSIONS - Obstructive Sleep Apnea (OSA). Optimal pressure attained. - No Significant Central Sleep Apnea (CSA).  DIAGNOSIS - Obstructive Sleep Apnea (G47.33)  RECOMMENDATIONS - Trial of BPAP therapy on 20/16 cm H2O with a Medium size [employer]ips Respironics Full Face Mask Dreamwear mask and heated humidification  Marius Ditch Sleep specialist, American Board of Internal Medicine  ELECTRONICALLY SIGNED ON:  02/15/2021, 8:06 PM [place] PH: [phone]   FX: [phone] [address]

## 2021-02-16 DIAGNOSIS — G4733 Obstructive sleep apnea (adult) (pediatric): Secondary | ICD-10-CM | POA: Diagnosis not present

## 2021-02-17 ENCOUNTER — Other Ambulatory Visit: Payer: Self-pay | Admitting: Cardiology

## 2021-02-17 NOTE — Telephone Encounter (Signed)
Furosemide 40 mg # 135 x 1 refill sent to   [facility], [place]

## 2021-02-20 DIAGNOSIS — Z9889 Other specified postprocedural states: Secondary | ICD-10-CM | POA: Diagnosis not present

## 2021-02-20 DIAGNOSIS — M25511 Pain in right shoulder: Secondary | ICD-10-CM | POA: Diagnosis not present

## 2021-02-22 DIAGNOSIS — M62511 Muscle wasting and atrophy, not elsewhere classified, right shoulder: Secondary | ICD-10-CM | POA: Diagnosis not present

## 2021-02-22 DIAGNOSIS — M25511 Pain in right shoulder: Secondary | ICD-10-CM | POA: Diagnosis not present

## 2021-02-22 DIAGNOSIS — S46001A Unspecified injury of muscle(s) and tendon(s) of the rotator cuff of right shoulder, initial encounter: Secondary | ICD-10-CM | POA: Diagnosis not present

## 2021-03-07 ENCOUNTER — Other Ambulatory Visit: Payer: Self-pay | Admitting: Cardiology

## 2021-03-08 DIAGNOSIS — S46001A Unspecified injury of muscle(s) and tendon(s) of the rotator cuff of right shoulder, initial encounter: Secondary | ICD-10-CM | POA: Diagnosis not present

## 2021-03-08 DIAGNOSIS — M62511 Muscle wasting and atrophy, not elsewhere classified, right shoulder: Secondary | ICD-10-CM | POA: Diagnosis not present

## 2021-03-08 DIAGNOSIS — M25511 Pain in right shoulder: Secondary | ICD-10-CM | POA: Diagnosis not present

## 2021-03-11 DIAGNOSIS — I2510 Atherosclerotic heart disease of native coronary artery without angina pectoris: Secondary | ICD-10-CM | POA: Diagnosis not present

## 2021-03-11 DIAGNOSIS — E785 Hyperlipidemia, unspecified: Secondary | ICD-10-CM | POA: Diagnosis not present

## 2021-03-13 DIAGNOSIS — G4733 Obstructive sleep apnea (adult) (pediatric): Secondary | ICD-10-CM | POA: Diagnosis not present

## 2021-03-15 DIAGNOSIS — S46001A Unspecified injury of muscle(s) and tendon(s) of the rotator cuff of right shoulder, initial encounter: Secondary | ICD-10-CM | POA: Diagnosis not present

## 2021-03-15 DIAGNOSIS — M25511 Pain in right shoulder: Secondary | ICD-10-CM | POA: Diagnosis not present

## 2021-03-15 DIAGNOSIS — M62511 Muscle wasting and atrophy, not elsewhere classified, right shoulder: Secondary | ICD-10-CM | POA: Diagnosis not present

## 2021-03-19 ENCOUNTER — Other Ambulatory Visit: Payer: Self-pay | Admitting: Cardiology

## 2021-03-22 DIAGNOSIS — M62511 Muscle wasting and atrophy, not elsewhere classified, right shoulder: Secondary | ICD-10-CM | POA: Diagnosis not present

## 2021-03-22 DIAGNOSIS — M25511 Pain in right shoulder: Secondary | ICD-10-CM | POA: Diagnosis not present

## 2021-03-22 DIAGNOSIS — S46001A Unspecified injury of muscle(s) and tendon(s) of the rotator cuff of right shoulder, initial encounter: Secondary | ICD-10-CM | POA: Diagnosis not present

## 2021-03-29 DIAGNOSIS — S46001A Unspecified injury of muscle(s) and tendon(s) of the rotator cuff of right shoulder, initial encounter: Secondary | ICD-10-CM | POA: Diagnosis not present

## 2021-03-29 DIAGNOSIS — M25511 Pain in right shoulder: Secondary | ICD-10-CM | POA: Diagnosis not present

## 2021-03-29 DIAGNOSIS — M62511 Muscle wasting and atrophy, not elsewhere classified, right shoulder: Secondary | ICD-10-CM | POA: Diagnosis not present

## 2021-04-05 DIAGNOSIS — M62511 Muscle wasting and atrophy, not elsewhere classified, right shoulder: Secondary | ICD-10-CM | POA: Diagnosis not present

## 2021-04-05 DIAGNOSIS — S46001A Unspecified injury of muscle(s) and tendon(s) of the rotator cuff of right shoulder, initial encounter: Secondary | ICD-10-CM | POA: Diagnosis not present

## 2021-04-05 DIAGNOSIS — M25511 Pain in right shoulder: Secondary | ICD-10-CM | POA: Diagnosis not present

## 2021-04-06 ENCOUNTER — Other Ambulatory Visit: Payer: Self-pay

## 2021-04-07 ENCOUNTER — Encounter: Payer: Self-pay | Admitting: Cardiology

## 2021-04-07 ENCOUNTER — Other Ambulatory Visit: Payer: Self-pay

## 2021-04-07 ENCOUNTER — Ambulatory Visit: Payer: PPO | Admitting: Cardiology

## 2021-04-07 VITALS — BP 114/62 | HR 67 | Ht 70.0 in | Wt 185.0 lb

## 2021-04-07 DIAGNOSIS — J84112 Idiopathic pulmonary fibrosis: Secondary | ICD-10-CM | POA: Diagnosis not present

## 2021-04-07 DIAGNOSIS — G4733 Obstructive sleep apnea (adult) (pediatric): Secondary | ICD-10-CM

## 2021-04-07 DIAGNOSIS — I255 Ischemic cardiomyopathy: Secondary | ICD-10-CM | POA: Diagnosis not present

## 2021-04-07 DIAGNOSIS — R0609 Other forms of dyspnea: Secondary | ICD-10-CM | POA: Diagnosis not present

## 2021-04-07 DIAGNOSIS — Z951 Presence of aortocoronary bypass graft: Secondary | ICD-10-CM | POA: Diagnosis not present

## 2021-04-07 DIAGNOSIS — E785 Hyperlipidemia, unspecified: Secondary | ICD-10-CM | POA: Diagnosis not present

## 2021-04-07 NOTE — Progress Notes (Signed)
Cardiology Office Note:    Date:  04/07/2021   ID:  Shane Massey, DOB December 20, 1940, MRN [id number]  PCP:  Maryella Shivers, MD  Cardiologist:  Jenne Campus, MD    Referring MD: Maryella Shivers, MD   No chief complaint on file. Doing fine  History of Present Illness:    Shane Massey is a 80 y.o. male   with past medical history significant for coronary artery disease.  In March 2017 he required PTCA and stenting of the mid LAD he also have history of ischemic cardiomyopathy however ejection fraction determined in January 2020 was normal.  He been complaining of fatigue tiredness for long period time eventually we decided to pursue cardiac catheterization.  That showed 60 to 75% stenosis of left main, 80% stenosis of ramus intermedius, 50% stenosis first diagonal branch on 08/13/2019 he ended up having coronary bypass graft with LIMA to LAD, left radial to second obtuse marginal branch and RIMA to ramus intermedius at the same time he did have PFO closure.  Recovery was complicated by an episode of atrial fibrillation successfully managed with amiodarone/anticoagulation, at the time of hospital living his ejection fraction was 45%.  Ongoing complaint is shortness of breath.  He does have some fine crackles on physical examination indicating possibility of pulmonary fibrosis.  He was referred to pulmonary.  Latest CT that I am reviewing done on 03/14/2020 showing presence of fibrotic changes within his lungs. He comes today 2 months of follow-up.  Denies have any chest pain tightness squeezing pressure burning chest.  Problem is shortness of breath as well as some shoulder issues he did have rotator cuff surgery done on the right side.  Gradually getting better, participating in physical therapy.  Past Medical History:  Diagnosis Date   Angina pectoris ([place]) 09/06/2015   CAD (coronary artery disease) 08/12/2019   Coronary artery disease    Dizziness 06/14/2017   Dyslipidemia 09/06/2015    Dyspnea    Dyspnea on exertion 09/06/2015   Dysrhythmia    Exertional dyspnea 09/06/2015   GERD (gastroesophageal reflux disease)    Hypertension    Hypothyroidism    Idiopathic pulmonary fibrosis ([place]) 04/07/2020   Ischemic cardiomyopathy 10/24/2016   Overview:  Ejection fraction 45% in spring 2018   Myocardial infarction [facility])    Near syncope    Nonsustained ventricular tachycardia ([place]) 07/17/2018   OSA (obstructive sleep apnea)    OSA on CPAP 11/14/2015   CPAP 13    Paroxysmal atrial fibrillation ([place]) 09/09/2019   Restless leg syndrome 11/30/2016   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    Past Surgical History:  Procedure Laterality Date   CATARACT EXTRACTION  2012   COLONOSCOPY  2013   CORONARY ARTERY BYPASS GRAFT N/A 08/13/2019   Procedure: CORONARY ARTERY BYPASS GRAFTING (CABG) TIMES 3.;  Surgeon: Shane Olds, MD;  Location: [place];  Service: Open Heart Surgery;  Laterality: N/A;   CORONARY STENT PLACEMENT     heart stent Left 09/2015   HERNIA REPAIR  11/1977   LEFT HEART CATH AND CORONARY ANGIOGRAPHY N/A 08/06/2019   Procedure: LEFT HEART CATH AND CORONARY ANGIOGRAPHY;  Surgeon: Shane Sine, MD;  Location: [place] [facility];  Service: Cardiovascular;  Laterality: N/A;   RADIAL ARTERY HARVEST Left 08/13/2019   Procedure: OPEN RADIAL ARTERY HARVEST;  Surgeon: Shane Olds, MD;  Location: [place];  Service: Open Heart Surgery;  Laterality: Left;   REPAIR OF PATENT FORAMEN OVALE N/A 08/13/2019  Procedure: CLOSURE OF PATENT FORAMEN OVALE;  Surgeon: Shane Olds, MD;  Location: [place];  Service: Open Heart Surgery;  Laterality: N/A;   SURGERY FOR BOWEL ABSCESS  10/1959   TEE WITHOUT CARDIOVERSION N/A 08/13/2019   Procedure: TRANSESOPHAGEAL ECHOCARDIOGRAM (TEE);  Surgeon: Shane Olds, MD;  Location: [place];  Service: Open Heart Surgery;  Laterality: N/A;    Current Medications: No outpatient medications have been marked as taking for the 04/07/21 encounter (Appointment) with  Park Liter, MD.     Allergies:   Patient has no known allergies.   Social History   Socioeconomic History   Marital status: Married    Spouse name: Not on file   Number of children: Not on file   Years of education: Not on file   Highest education level: Not on file  Occupational History   Not on file  Tobacco Use   Smoking status: Former    Packs/day: 0.50    Years: 4.00    Pack years: 2.00    Types: Cigarettes    Quit date: 06/11/1958    Years since quitting: 62.8   Smokeless tobacco: Former    Types: Chew    Quit date: 12/01/1958  Vaping Use   Vaping Use: Never used  Substance and Sexual Activity   Alcohol use: No    Alcohol/week: 0.0 standard drinks   Drug use: No   Sexual activity: Not on file  Other Topics Concern   Not on file  Social History Narrative   Not on file   Social Determinants of Health   Financial Resource Strain: Not on file  Food Insecurity: Not on file  Transportation Needs: Not on file  Physical Activity: Not on file  Stress: Not on file  Social Connections: Not on file     Family History: The patient's family history includes Asthma in his brother and maternal grandmother; Heart attack in his brother; Heart disease in his brother; Stroke in his father. ROS:   Please see the history of present illness.    All 14 point review of systems negative except as described per history of present illness  EKGs/Labs/Other Studies Reviewed:      Recent Labs: 04/12/2020: NT-Pro BNP 175 09/01/2020: BUN 18; Creatinine, Ser 0.81; Potassium 4.2; Sodium 139  Recent Lipid Panel    Component Value Date/Time   CHOL 128 04/12/2020 0913   TRIG 111 04/12/2020 0913   HDL 48 04/12/2020 0913   CHOLHDL 2.7 04/12/2020 0913   CHOLHDL 2.5 08/13/2019 0603   VLDL 25 08/13/2019 0603   LDLCALC 60 04/12/2020 0913    Physical Exam:    VS:  There were no vitals taken for this visit.    Wt Readings from Last 3 Encounters:  01/23/21 183 lb (83 kg)   09/01/20 187 lb (84.8 kg)  08/31/20 187 lb (84.8 kg)     GEN:  Well nourished, well developed in no acute distress HEENT: Normal NECK: No JVD; No carotid bruits LYMPHATICS: No lymphadenopathy CARDIAC: RRR, no murmurs, no rubs, no gallops RESPIRATORY: Bilateral faint crackles both bases ABDOMEN: Soft, non-tender, non-distended MUSCULOSKELETAL:  No edema; No deformity  SKIN: Warm and dry LOWER EXTREMITIES: no swelling NEUROLOGIC:  Alert and oriented x 3 PSYCHIATRIC:  Normal affect   ASSESSMENT:    1. S/P CABG x 3   2. Ischemic cardiomyopathy   3. Dyslipidemia   4. Dyspnea on exertion   5. OSA (obstructive sleep apnea)   6. Idiopathic pulmonary fibrosis (  [place])    PLAN:    In order of problems listed above:  Status post coronary bypass graft stable on appropriate medications which I will continue. Ischemic cardiomyopathy with latest estimation of ejection fraction 45%.  Will get echocardiogram again especially in view of the fact that he does have shortness of breath. Dyslipidemia: He is LDL of 60 HDL is 47.  Appropriate management with high intensity statin which I will continue. Dyspnea on exertion he does have pulmonary fibrosis will be referred to pulmonary.   Medication Adjustments/Labs and Tests Ordered: Current medicines are reviewed at length with the patient today.  Concerns regarding medicines are outlined above.  No orders of the defined types were placed in this encounter.  Medication changes: No orders of the defined types were placed in this encounter.   Signed, Park Liter, MD, [facility] 04/07/2021 12:47 PM    [place]

## 2021-04-07 NOTE — Patient Instructions (Signed)
Medication Instructions:  Your physician recommends that you continue on your current medications as directed. Please refer to the Current Medication list given to you today.  *If you need a refill on your cardiac medications before your next appointment, please call your pharmacy*   Lab Work: NONE If you have labs (blood work) drawn today and your tests are completely normal, you will receive your results only by: [place] (if you have MyChart) OR A paper copy in the mail If you have any lab test that is abnormal or we need to change your treatment, we will call you to review the results.   Testing/Procedures: Your physician has requested that you have an echocardiogram. Echocardiography is a painless test that uses sound waves to create images of your heart. It provides your doctor with information about the size and shape of your heart and how well your heart's chambers and valves are working. This procedure takes approximately one hour. There are no restrictions for this procedure.    Follow-Up: At [facility], you and your health needs are our priority.  As part of our continuing mission to provide you with exceptional heart care, we have created designated Provider Care Teams.  These Care Teams include your primary Cardiologist (physician) and Advanced Practice Providers (APPs -  Physician Assistants and Nurse Practitioners) who all work together to provide you with the care you need, when you need it.  We recommend signing up for the patient portal called "MyChart".  Sign up information is provided on this After Visit Summary.  MyChart is used to connect with patients for Virtual Visits (Telemedicine).  Patients are able to view lab/test results, encounter notes, upcoming appointments, etc.  Non-urgent messages can be sent to your provider as well.   To learn more about what you can do with MyChart, go to [email or web address].    Your next appointment:   5 month(s)  The  format for your next appointment:   In Person  Provider:   Jenne Campus, MD   Other Instructions

## 2021-04-07 NOTE — Addendum Note (Signed)
Addended by: Jerl Santos R on: 04/07/2021 01:20 PM   Modules accepted: Orders

## 2021-04-07 NOTE — Addendum Note (Signed)
Addended by: Jerl Santos R on: 04/07/2021 01:17 PM   Modules accepted: Orders

## 2021-04-10 ENCOUNTER — Telehealth: Payer: Self-pay | Admitting: Cardiology

## 2021-04-10 NOTE — Telephone Encounter (Signed)
Patient and wife called to ask Dr. Wendy Poet nurse about the pulmonary that he see. Please call back asap

## 2021-04-11 ENCOUNTER — Other Ambulatory Visit: Payer: Self-pay

## 2021-04-11 ENCOUNTER — Ambulatory Visit (INDEPENDENT_AMBULATORY_CARE_PROVIDER_SITE_OTHER): Payer: PPO

## 2021-04-11 DIAGNOSIS — R0609 Other forms of dyspnea: Secondary | ICD-10-CM | POA: Diagnosis not present

## 2021-04-11 DIAGNOSIS — E785 Hyperlipidemia, unspecified: Secondary | ICD-10-CM | POA: Diagnosis not present

## 2021-04-11 DIAGNOSIS — I10 Essential (primary) hypertension: Secondary | ICD-10-CM | POA: Diagnosis not present

## 2021-04-11 DIAGNOSIS — I2510 Atherosclerotic heart disease of native coronary artery without angina pectoris: Secondary | ICD-10-CM | POA: Diagnosis not present

## 2021-04-11 LAB — ECHOCARDIOGRAM COMPLETE
Area-P 1/2: 3.56 cm2
Calc EF: 30.9 %
S' Lateral: 4.05 cm
Single Plane A2C EF: 26.1 %
Single Plane A4C EF: 39 %

## 2021-04-11 MED ORDER — PERFLUTREN LIPID MICROSPHERE
1.0000 mL | INTRAVENOUS | Status: AC | PRN
  Administered 2021-04-11: 6 mL via INTRAVENOUS

## 2021-04-11 NOTE — Telephone Encounter (Signed)
Spoke to patient informed him of pulmonology name.

## 2021-04-12 ENCOUNTER — Telehealth: Payer: Self-pay | Admitting: Pulmonary Disease

## 2021-04-12 DIAGNOSIS — M25511 Pain in right shoulder: Secondary | ICD-10-CM | POA: Diagnosis not present

## 2021-04-12 DIAGNOSIS — S46001A Unspecified injury of muscle(s) and tendon(s) of the rotator cuff of right shoulder, initial encounter: Secondary | ICD-10-CM | POA: Diagnosis not present

## 2021-04-12 DIAGNOSIS — M62511 Muscle wasting and atrophy, not elsewhere classified, right shoulder: Secondary | ICD-10-CM | POA: Diagnosis not present

## 2021-04-12 NOTE — Telephone Encounter (Signed)
Fine with me. 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 No prone images which would be helpful in the future.  DLCO within normal limits on PFTs at time of evaluation. Did not work up further at the time given stable images and preserved DLCO. Trialed inhalers given report of asthma, atopic symptoms. Recommended 3 month follow up 1 year ago. Have not seen him in follow up since. Seems has had ongoing dyspnea. Notably TTE yesterday with EF reduced from prior, EF had recovered post CABG 10/2019.

## 2021-04-12 NOTE — Telephone Encounter (Signed)
LMTCB  MH and PM please advise if okay for provider switch. Thanks :)

## 2021-04-13 ENCOUNTER — Telehealth: Payer: Self-pay | Admitting: Cardiology

## 2021-04-13 DIAGNOSIS — G4733 Obstructive sleep apnea (adult) (pediatric): Secondary | ICD-10-CM | POA: Diagnosis not present

## 2021-04-13 NOTE — Telephone Encounter (Signed)
Called and spoke with patient's wife. She is aware that both of the providers have agreed to the switch. I was able to get him scheduled to see Dr. [name] on 11/9 at 3pm. She verbalized understanding.   Nothing further needed at time of call.

## 2021-04-13 NOTE — Telephone Encounter (Signed)
Patient returning call for test results

## 2021-04-13 NOTE — Telephone Encounter (Signed)
Ok with me 

## 2021-04-14 ENCOUNTER — Telehealth: Payer: Self-pay | Admitting: Emergency Medicine

## 2021-04-14 DIAGNOSIS — Z79899 Other long term (current) drug therapy: Secondary | ICD-10-CM

## 2021-04-14 MED ORDER — LOSARTAN POTASSIUM 25 MG PO TABS: 25.0000 mg | ORAL_TABLET | Freq: Two times a day (BID) | ORAL | 1 refills | Status: DC

## 2021-04-14 NOTE — Telephone Encounter (Signed)
Spoke to patient wife per dpr. Informed her of results and for patient to stop lisinopril (states he wasn't taking anyway) and increase losartan to 25 mg twice daily. Patient wife understood no further questions. He will also repeat labs in 1 week.

## 2021-04-14 NOTE — Telephone Encounter (Signed)
-----   Message from Gita Kudo, RN sent at 04/13/2021  8:33 AM EDT -----  ----- Message ----- From: Park Liter, MD Sent: 04/13/2021   8:31 AM EDT To: Gita Kudo, RN  Echocardiogram showed mildly diminished left ventricle ejection fraction 40 to 45%, please call the patient and clarified what medication he takes looking at the list of medication there is lisinopril as well as losartan he needs to start taking losartan 25 twice daily with Chem-7 in about 1 week and discontinue lisinopril.

## 2021-04-17 DIAGNOSIS — M549 Dorsalgia, unspecified: Secondary | ICD-10-CM | POA: Diagnosis not present

## 2021-04-17 DIAGNOSIS — Z6828 Body mass index (BMI) 28.0-28.9, adult: Secondary | ICD-10-CM | POA: Diagnosis not present

## 2021-04-17 DIAGNOSIS — M47816 Spondylosis without myelopathy or radiculopathy, lumbar region: Secondary | ICD-10-CM | POA: Diagnosis not present

## 2021-04-19 ENCOUNTER — Encounter: Payer: Self-pay | Admitting: Pulmonary Disease

## 2021-04-19 ENCOUNTER — Other Ambulatory Visit: Payer: Self-pay

## 2021-04-19 ENCOUNTER — Ambulatory Visit: Payer: PPO | Admitting: Pulmonary Disease

## 2021-04-19 VITALS — BP 122/58 | HR 59 | Temp 98.0°F | Ht 70.0 in | Wt 183.8 lb

## 2021-04-19 DIAGNOSIS — J84112 Idiopathic pulmonary fibrosis: Secondary | ICD-10-CM | POA: Diagnosis not present

## 2021-04-19 DIAGNOSIS — J849 Interstitial pulmonary disease, unspecified: Secondary | ICD-10-CM | POA: Diagnosis not present

## 2021-04-19 NOTE — Patient Instructions (Signed)
I have reviewed your CT scan that shows very minimal changes or scarring Reviewed some labs including ANA, rheumatoid factor and CCP  Schedule repeat high-res CT and PFTs Follow-up in clinic after these tests

## 2021-04-19 NOTE — Progress Notes (Signed)
Shane Massey    [id number]    [birth year]  Primary Care Physician:Hodges, Beatriz Chancellor, MD  Referring Physician: Maryella Shivers, MD [place],  [place] [address]  Chief complaint: Consult for interstitial lung disease  HPI: 80 year old with history of cardiomyopathy, OSA, GERD Here for evaluation of interstitial lung disease Complains of chronic dyspnea on exertion.  Has to stop after walking about 200 feet.  No symptoms at rest Denies any cough, sputum production, chest congestion or wheezing He had a high-res CT showing mild interstitial changes which are being monitored.  Previously tried on ICS/LABA inhaler with no improvement in symptoms  History also significant for ischemic cardiomyopathy for which she follows with Dr. Agustin Cree.  Recent echocardiogram showed EF of 40-45% and losartan added instead of lisinopril OSA for which he is on BiPAP, managed by Dr. Maxwell Caul at [facility] He has GERD treated with Dexilant Has remained active and likes to deer hunt as a hobby  I have reviewed the notes from cardiology, prior pulmonary and sleep clinic notes  Pets: No pets Occupation: Worked as a Therapist, occupational in [employer]  [place] Exposures: Asbestos exposure as above.  No mold, hot tub, Jacuzzi.  No feather pillows or comforter Smoking history: Minimal smoking as a teenager Travel history: No significant travel Relevant family history: No family history of lung disease   Outpatient Encounter Medications as of 04/19/2021  Medication Sig   ascorbic acid (VITAMIN C) 500 MG tablet Take 500 mg by mouth daily as needed (ocassionally takes per patient).   aspirin EC 81 MG tablet Take 81 mg by mouth daily.   Calcium Carb-Cholecalciferol (CALCIUM 600+D3 PO) Take 1 tablet by mouth daily at 12 noon.   clopidogrel (PLAVIX) 75 MG tablet Take 1 tablet (75 mg total) by mouth daily.   dexlansoprazole  (DEXILANT) 60 MG capsule Take 60 mg by mouth daily before breakfast.   furosemide (LASIX) 40 MG tablet TAKE 1 & 1/2 (ONE & ONE-HALF) TABLETS BY MOUTH ONCE DAILY (Patient taking differently: Take 60 mg by mouth daily. TAKE 1 & 1/2 (ONE & ONE-HALF) TABLETS BY MOUTH ONCE DAILY)   isosorbide mononitrate (IMDUR) 30 MG 24 hr tablet Take 1 tablet by mouth once daily (Patient taking differently: Take 30 mg by mouth daily.)   levothyroxine (SYNTHROID, LEVOTHROID) 50 MCG tablet Take 50 mcg by mouth daily before breakfast.   losartan (COZAAR) 25 MG tablet Take 1 tablet (25 mg total) by mouth in the morning and at bedtime.   metoprolol succinate (TOPROL-XL) 25 MG 24 hr tablet TAKE 1 TABLET BY MOUTH ONCE DAILY WITH FOOD OR  IMMEDIATELY  FOLLOWING  A  MEAL (Patient taking differently: Take 25 mg by mouth daily.)   Multiple Vitamin (MULTIVITAMIN WITH MINERALS) TABS tablet Take 1 tablet by mouth daily at 12 noon. Unknown strength per patient   nitroGLYCERIN (NITROSTAT) 0.4 MG SL tablet Place 1 tablet (0.4 mg total) under the tongue every 5 (five) minutes as needed for chest pain.   potassium chloride (KLOR-CON) 10 MEQ tablet Take 1 tablet by mouth once daily (Patient taking differently: Take 10 mEq by mouth daily.)   pramipexole (MIRAPEX) 1.5 MG tablet Take 1.5 mg by mouth at bedtime.    rosuvastatin (CRESTOR) 20 MG tablet Take 1 tablet by mouth once daily (Patient taking differently: Take 20 mg by mouth daily.)   [DISCONTINUED] fluticasone furoate-vilanterol (BREO ELLIPTA) 200-25  MCG/INH AEPB Inhale 1 puff into the lungs daily. Rinse out your mouth after every use.   [DISCONTINUED] Glycerin-Hypromellose-PEG 400 (DRY EYE RELIEF DROPS) 0.2-0.2-1 % SOLN Place 1 drop into both eyes 3 (three) times daily as needed (dry/irritated eyes.).   No facility-administered encounter medications on file as of 04/19/2021.    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[place]) 04/07/2020   Ischemic cardiomyopathy 10/24/2016   Overview:  Ejection fraction 45% in spring 2018   Myocardial infarction [facility])    Near syncope    Nonsustained ventricular tachycardia 07/17/2018   OSA (obstructive sleep apnea)    OSA on CPAP 11/14/2015   CPAP 13    Paroxysmal atrial fibrillation ([place]) 09/09/2019   Restless leg syndrome 11/30/2016   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    Past Surgical History:  Procedure Laterality Date   CATARACT EXTRACTION  2012   COLONOSCOPY  2013   CORONARY ARTERY BYPASS GRAFT N/A 08/13/2019   Procedure: CORONARY ARTERY BYPASS GRAFTING (CABG) TIMES 3.;  Surgeon: Wonda Olds, MD;  Location: [place];  Service: Open Heart Surgery;  Laterality: N/A;   CORONARY STENT PLACEMENT     heart stent Left 09/2015   HERNIA REPAIR  11/1977   LEFT HEART CATH AND CORONARY ANGIOGRAPHY N/A 08/06/2019   Procedure: LEFT HEART CATH AND CORONARY ANGIOGRAPHY;  Surgeon: Troy Sine, MD;  Location: [place] [facility];  Service: Cardiovascular;  Laterality: N/A;   RADIAL ARTERY HARVEST Left 08/13/2019   Procedure: OPEN RADIAL ARTERY HARVEST;  Surgeon: Wonda Olds, MD;  Location: [place];  Service: Open Heart Surgery;  Laterality: Left;   REPAIR OF PATENT FORAMEN OVALE N/A 08/13/2019   Procedure: CLOSURE OF PATENT FORAMEN OVALE;  Surgeon: Wonda Olds, MD;  Location: [place];  Service: Open Heart Surgery;  Laterality: N/A;   RTC repair  Right 11/03/2020   SURGERY FOR BOWEL ABSCESS  10/1959   TEE WITHOUT CARDIOVERSION N/A 08/13/2019   Procedure: TRANSESOPHAGEAL ECHOCARDIOGRAM (TEE);  Surgeon: Wonda Olds, MD;  Location: [place];   Service: Open Heart Surgery;  Laterality: N/A;    Family History  Problem Relation Age of Onset   Stroke Father    Asthma Brother    Heart disease Brother    Heart attack Brother    Asthma Maternal Grandmother     Social History   Socioeconomic History   Marital status: Married    Spouse name: Not on file   Number of children: Not on file   Years of education: Not on file   Highest education level: Not on file  Occupational History   Not on file  Tobacco Use   Smoking status: Former    Packs/day: 0.50    Years: 4.00    Pack years: 2.00    Types: Cigarettes    Quit date: 06/11/1958    Years since quitting: 62.8   Smokeless tobacco: Former    Types: Chew    Quit date: 12/01/1958  Vaping Use   Vaping Use: Never used  Substance and Sexual Activity   Alcohol use: No    Alcohol/week: 0.0 standard drinks   Drug use: No   Sexual activity: Not on file  Other Topics Concern   Not on file  Social History Narrative   Not on file   Social Determinants of Health   Financial Resource Strain: Not on file  Food Insecurity: Not on file  Transportation Needs: Not on file  Physical Activity: Not on file  Stress: Not on file  Social Connections: Not on file  Intimate Partner Violence: Not on file    Review of systems: Review of Systems  Constitutional: Negative for fever and chills.  HENT: Negative.   Eyes: Negative for blurred vision.  Respiratory: as per HPI  Cardiovascular: Negative for chest pain and palpitations.  Gastrointestinal: Negative for vomiting, diarrhea, blood per rectum. Genitourinary: Negative for dysuria, urgency, frequency and hematuria.  Musculoskeletal: Negative for myalgias, back pain and joint pain.  Skin: Negative for itching and rash.  Neurological: Negative for dizziness, tremors, focal weakness, seizures and loss of consciousness.  Endo/Heme/Allergies: Negative for environmental allergies.  Psychiatric/Behavioral: Negative for depression,  suicidal ideas and hallucinations.  All other systems reviewed and are negative.  Physical Exam: Blood pressure (!) 122/58, pulse (!) 59, temperature 98 F (36.7 C), temperature source Oral, height 5\' 10"  (1.778 m), weight 183 lb 12.8 oz (83.4 kg), SpO2 96 %. Gen:      No acute distress HEENT:  EOMI, sclera anicteric Neck:     No masses; no thyromegaly Lungs:    Clear to auscultation bilaterally; normal respiratory effort CV:         Regular rate and rhythm; no murmurs Abd:      + bowel sounds; soft, non-tender; no palpable masses, no distension Ext:    No edema; adequate peripheral perfusion Skin:      Warm and dry; no rash Neuro: alert and oriented x 3 Psych: normal mood and affect  Data Reviewed: Imaging: CT chest [facility] 08/12/2015-bilateral interstitial changes with reticulation and groundglass opacities  High-res CT 03/14/2020- Patchy confluent mild-to-moderate subpleural reticulation and ground-glass opacity in both lungs with associated minimal traction bronchiolectasis and architectural distortion.  Alternate diagnosis.  No progression since 2017 I have reviewed the images personally  PFTs: 03/18/2020 FVC 2.80 [61%], FEV1 2.37 [79%], F/F 85, TLC 4.70 (66%), DLCO 18.80 [75%] Mild restriction diffusion impairment  Labs:  Cardiac: Echocardiogram 04/11/2021- EF 40-45%.  Normal RV systolic size and function.  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 Suspect his dyspnea is multifactorial from cardiac issues and interstitial lung disease.  PFTs show mild restriction and diffusion impairment consistent with ILD  Etiology of ILD is not clear but he does have some asbestos exposure in the past.  There are no signs and symptoms of connective tissue disease  I will repeat the high-res CT and PFTs to see if this  change since her last assessment Check basic CTD profile including ANA, rheumatoid factor and CCP  Plan/Recommendations: ANA, rheumatoid factor, CCP Repeat high-res CT, PFT  Marshell Garfinkel MD [facility] 04/19/2021, 3:13 PM  CC: Maryella Shivers, MD

## 2021-04-21 DIAGNOSIS — Z79899 Other long term (current) drug therapy: Secondary | ICD-10-CM | POA: Diagnosis not present

## 2021-04-21 LAB — BASIC METABOLIC PANEL
BUN/Creatinine Ratio: 23 (ref 10–24)
BUN: 21 mg/dL (ref 8–27)
CO2: 24 mmol/L (ref 20–29)
Calcium: 9.9 mg/dL (ref 8.6–10.2)
Chloride: 105 mmol/L (ref 96–106)
Creatinine, Ser: 0.92 mg/dL (ref 0.76–1.27)
Glucose: 84 mg/dL (ref 70–99)
Potassium: 5.1 mmol/L (ref 3.5–5.2)
Sodium: 143 mmol/L (ref 134–144)
eGFR: 85 mL/min/{1.73_m2} (ref >59)

## 2021-04-22 ENCOUNTER — Encounter: Payer: Self-pay | Admitting: Pulmonary Disease

## 2021-04-22 LAB — ANTI-NUCLEAR AB-TITER (ANA TITER): ANA Titer 1: 1:40 {titer} — ABNORMAL HIGH

## 2021-04-22 LAB — CYCLIC CITRUL PEPTIDE ANTIBODY, IGG: Cyclic Citrullin Peptide Ab: <16 UNITS

## 2021-04-22 LAB — ANA: Anti Nuclear Antibody (ANA): POSITIVE — AB

## 2021-04-22 LAB — RHEUMATOID FACTOR: Rheumatoid fact SerPl-aCnc: <14 IU/mL (ref <14)

## 2021-05-01 DIAGNOSIS — G4733 Obstructive sleep apnea (adult) (pediatric): Secondary | ICD-10-CM | POA: Diagnosis not present

## 2021-05-03 DIAGNOSIS — M25511 Pain in right shoulder: Secondary | ICD-10-CM | POA: Diagnosis not present

## 2021-05-03 DIAGNOSIS — M62511 Muscle wasting and atrophy, not elsewhere classified, right shoulder: Secondary | ICD-10-CM | POA: Diagnosis not present

## 2021-05-03 DIAGNOSIS — S46001A Unspecified injury of muscle(s) and tendon(s) of the rotator cuff of right shoulder, initial encounter: Secondary | ICD-10-CM | POA: Diagnosis not present

## 2021-05-08 ENCOUNTER — Other Ambulatory Visit: Payer: Self-pay | Admitting: Cardiology

## 2021-05-08 NOTE — Telephone Encounter (Signed)
Rx refill sent to pharmacy. 

## 2021-05-10 DIAGNOSIS — S46001A Unspecified injury of muscle(s) and tendon(s) of the rotator cuff of right shoulder, initial encounter: Secondary | ICD-10-CM | POA: Diagnosis not present

## 2021-05-10 DIAGNOSIS — M25511 Pain in right shoulder: Secondary | ICD-10-CM | POA: Diagnosis not present

## 2021-05-10 DIAGNOSIS — M62511 Muscle wasting and atrophy, not elsewhere classified, right shoulder: Secondary | ICD-10-CM | POA: Diagnosis not present

## 2021-05-11 ENCOUNTER — Ambulatory Visit
Admission: RE | Admit: 2021-05-11 | Discharge: 2021-05-11 | Disposition: A | Discharge status: Home / Self Care | Payer: PPO | Source: Ambulatory Visit | Attending: Pulmonary Disease | Admitting: Pulmonary Disease

## 2021-05-11 DIAGNOSIS — I517 Cardiomegaly: Secondary | ICD-10-CM | POA: Diagnosis not present

## 2021-05-11 DIAGNOSIS — I2510 Atherosclerotic heart disease of native coronary artery without angina pectoris: Secondary | ICD-10-CM | POA: Diagnosis not present

## 2021-05-11 DIAGNOSIS — J84112 Idiopathic pulmonary fibrosis: Secondary | ICD-10-CM

## 2021-05-11 DIAGNOSIS — I10 Essential (primary) hypertension: Secondary | ICD-10-CM | POA: Diagnosis not present

## 2021-05-11 DIAGNOSIS — E785 Hyperlipidemia, unspecified: Secondary | ICD-10-CM | POA: Diagnosis not present

## 2021-05-11 DIAGNOSIS — I700 Atherosclerosis of aorta: Secondary | ICD-10-CM | POA: Diagnosis not present

## 2021-05-11 DIAGNOSIS — R0602 Shortness of breath: Secondary | ICD-10-CM | POA: Diagnosis not present

## 2021-05-13 DIAGNOSIS — G4733 Obstructive sleep apnea (adult) (pediatric): Secondary | ICD-10-CM | POA: Diagnosis not present

## 2021-05-18 DIAGNOSIS — G4733 Obstructive sleep apnea (adult) (pediatric): Secondary | ICD-10-CM | POA: Diagnosis not present

## 2021-05-20 ENCOUNTER — Other Ambulatory Visit: Payer: Self-pay | Admitting: Cardiology

## 2021-05-22 NOTE — Telephone Encounter (Signed)
Rx refill sent to pharmacy. 

## 2021-05-25 DIAGNOSIS — Z6828 Body mass index (BMI) 28.0-28.9, adult: Secondary | ICD-10-CM | POA: Diagnosis not present

## 2021-05-25 DIAGNOSIS — L853 Xerosis cutis: Secondary | ICD-10-CM | POA: Diagnosis not present

## 2021-06-07 DIAGNOSIS — G4733 Obstructive sleep apnea (adult) (pediatric): Secondary | ICD-10-CM | POA: Diagnosis not present

## 2021-06-11 DIAGNOSIS — I10 Essential (primary) hypertension: Secondary | ICD-10-CM | POA: Diagnosis not present

## 2021-06-11 DIAGNOSIS — R7303 Prediabetes: Secondary | ICD-10-CM | POA: Diagnosis not present

## 2021-06-11 DIAGNOSIS — E785 Hyperlipidemia, unspecified: Secondary | ICD-10-CM | POA: Diagnosis not present

## 2021-06-11 DIAGNOSIS — I2510 Atherosclerotic heart disease of native coronary artery without angina pectoris: Secondary | ICD-10-CM | POA: Diagnosis not present

## 2021-06-13 DIAGNOSIS — G4733 Obstructive sleep apnea (adult) (pediatric): Secondary | ICD-10-CM | POA: Diagnosis not present

## 2021-06-15 DIAGNOSIS — L821 Other seborrheic keratosis: Secondary | ICD-10-CM | POA: Diagnosis not present

## 2021-06-15 DIAGNOSIS — L578 Other skin changes due to chronic exposure to nonionizing radiation: Secondary | ICD-10-CM | POA: Diagnosis not present

## 2021-06-15 DIAGNOSIS — L570 Actinic keratosis: Secondary | ICD-10-CM | POA: Diagnosis not present

## 2021-06-16 ENCOUNTER — Other Ambulatory Visit: Payer: Self-pay | Admitting: Cardiology

## 2021-06-16 DIAGNOSIS — R059 Cough, unspecified: Secondary | ICD-10-CM | POA: Diagnosis not present

## 2021-06-16 DIAGNOSIS — Z20822 Contact with and (suspected) exposure to covid-19: Secondary | ICD-10-CM | POA: Diagnosis not present

## 2021-06-19 ENCOUNTER — Telehealth: Payer: Self-pay | Admitting: Pulmonary Disease

## 2021-06-19 DIAGNOSIS — E039 Hypothyroidism, unspecified: Secondary | ICD-10-CM | POA: Diagnosis not present

## 2021-06-19 DIAGNOSIS — R7303 Prediabetes: Secondary | ICD-10-CM | POA: Diagnosis not present

## 2021-06-19 DIAGNOSIS — E785 Hyperlipidemia, unspecified: Secondary | ICD-10-CM | POA: Diagnosis not present

## 2021-06-19 NOTE — Telephone Encounter (Signed)
Noted.   Will close encounter for now.

## 2021-06-19 NOTE — Telephone Encounter (Signed)
Noted.  I am glad he is improving with the azithromycin It looks like the PFTs have been rescheduled to next month.  I will review them at office visit

## 2021-06-19 NOTE — Telephone Encounter (Signed)
Call returned to patient, confirmed DOB. Patient states he was recently diagnosed with Pneumonia. He was placed on Azithromycin. He came in for his PFT but had to reschedule due to the Pneumonia. He reports feeling better since starting his antibiotic but still feels fatigued and not back to his baseline as of yet.   PM just FYI pt has pneumonia currently on Antibiotic. Aware to call if any further interventions are needed.

## 2021-06-20 ENCOUNTER — Ambulatory Visit: Payer: PPO | Admitting: Pulmonary Disease

## 2021-06-20 ENCOUNTER — Other Ambulatory Visit: Payer: Self-pay | Admitting: Cardiology

## 2021-06-21 NOTE — Telephone Encounter (Signed)
RX sent

## 2021-06-23 ENCOUNTER — Other Ambulatory Visit: Payer: Self-pay | Admitting: Cardiology

## 2021-06-26 DIAGNOSIS — R0609 Other forms of dyspnea: Secondary | ICD-10-CM | POA: Diagnosis not present

## 2021-06-26 DIAGNOSIS — J849 Interstitial pulmonary disease, unspecified: Secondary | ICD-10-CM | POA: Diagnosis not present

## 2021-06-26 DIAGNOSIS — Z139 Encounter for screening, unspecified: Secondary | ICD-10-CM | POA: Diagnosis not present

## 2021-06-26 DIAGNOSIS — Z1331 Encounter for screening for depression: Secondary | ICD-10-CM | POA: Diagnosis not present

## 2021-06-26 DIAGNOSIS — I2510 Atherosclerotic heart disease of native coronary artery without angina pectoris: Secondary | ICD-10-CM | POA: Diagnosis not present

## 2021-06-26 DIAGNOSIS — R7303 Prediabetes: Secondary | ICD-10-CM | POA: Diagnosis not present

## 2021-06-26 DIAGNOSIS — I10 Essential (primary) hypertension: Secondary | ICD-10-CM | POA: Diagnosis not present

## 2021-06-30 DIAGNOSIS — I517 Cardiomegaly: Secondary | ICD-10-CM | POA: Diagnosis not present

## 2021-06-30 DIAGNOSIS — R079 Chest pain, unspecified: Secondary | ICD-10-CM | POA: Diagnosis not present

## 2021-06-30 DIAGNOSIS — I2510 Atherosclerotic heart disease of native coronary artery without angina pectoris: Secondary | ICD-10-CM | POA: Diagnosis not present

## 2021-06-30 DIAGNOSIS — R0602 Shortness of breath: Secondary | ICD-10-CM | POA: Diagnosis not present

## 2021-07-07 ENCOUNTER — Encounter: Payer: Self-pay | Admitting: Cardiology

## 2021-07-07 ENCOUNTER — Other Ambulatory Visit: Payer: Self-pay

## 2021-07-07 ENCOUNTER — Ambulatory Visit: Payer: PPO | Admitting: Cardiology

## 2021-07-07 VITALS — BP 150/78 | HR 56 | Ht 69.0 in | Wt 191.6 lb

## 2021-07-07 DIAGNOSIS — I209 Angina pectoris, unspecified: Secondary | ICD-10-CM

## 2021-07-07 DIAGNOSIS — G4734 Idiopathic sleep related nonobstructive alveolar hypoventilation: Secondary | ICD-10-CM | POA: Insufficient documentation

## 2021-07-07 DIAGNOSIS — E785 Hyperlipidemia, unspecified: Secondary | ICD-10-CM

## 2021-07-07 DIAGNOSIS — I10 Essential (primary) hypertension: Secondary | ICD-10-CM

## 2021-07-07 DIAGNOSIS — G4733 Obstructive sleep apnea (adult) (pediatric): Secondary | ICD-10-CM

## 2021-07-07 DIAGNOSIS — K219 Gastro-esophageal reflux disease without esophagitis: Secondary | ICD-10-CM | POA: Diagnosis not present

## 2021-07-07 DIAGNOSIS — I255 Ischemic cardiomyopathy: Secondary | ICD-10-CM

## 2021-07-07 DIAGNOSIS — I25709 Atherosclerosis of coronary artery bypass graft(s), unspecified, with unspecified angina pectoris: Secondary | ICD-10-CM

## 2021-07-07 DIAGNOSIS — I517 Cardiomegaly: Secondary | ICD-10-CM | POA: Diagnosis not present

## 2021-07-07 DIAGNOSIS — Z9989 Dependence on other enabling machines and devices: Secondary | ICD-10-CM | POA: Insufficient documentation

## 2021-07-07 DIAGNOSIS — I25708 Atherosclerosis of coronary artery bypass graft(s), unspecified, with other forms of angina pectoris: Secondary | ICD-10-CM

## 2021-07-07 DIAGNOSIS — Z951 Presence of aortocoronary bypass graft: Secondary | ICD-10-CM | POA: Diagnosis not present

## 2021-07-07 DIAGNOSIS — R0602 Shortness of breath: Secondary | ICD-10-CM | POA: Diagnosis not present

## 2021-07-07 DIAGNOSIS — K449 Diaphragmatic hernia without obstruction or gangrene: Secondary | ICD-10-CM | POA: Diagnosis not present

## 2021-07-07 DIAGNOSIS — R079 Chest pain, unspecified: Secondary | ICD-10-CM | POA: Diagnosis not present

## 2021-07-07 DIAGNOSIS — I2699 Other pulmonary embolism without acute cor pulmonale: Secondary | ICD-10-CM | POA: Diagnosis not present

## 2021-07-07 DIAGNOSIS — Z6829 Body mass index (BMI) 29.0-29.9, adult: Secondary | ICD-10-CM | POA: Diagnosis not present

## 2021-07-07 DIAGNOSIS — R059 Cough, unspecified: Secondary | ICD-10-CM | POA: Diagnosis not present

## 2021-07-07 DIAGNOSIS — R0609 Other forms of dyspnea: Secondary | ICD-10-CM | POA: Diagnosis not present

## 2021-07-07 DIAGNOSIS — I5032 Chronic diastolic (congestive) heart failure: Secondary | ICD-10-CM | POA: Diagnosis not present

## 2021-07-07 HISTORY — DX: Idiopathic sleep related nonobstructive alveolar hypoventilation: G47.34

## 2021-07-07 HISTORY — DX: Dependence on other enabling machines and devices: Z99.89

## 2021-07-07 HISTORY — DX: Atherosclerosis of coronary artery bypass graft(s), unspecified, with unspecified angina pectoris: I25.709

## 2021-07-07 HISTORY — DX: Atherosclerosis of coronary artery bypass graft(s), unspecified, with other forms of angina pectoris: I25.708

## 2021-07-07 MED ORDER — RANOLAZINE ER 500 MG PO TB12: 500.0000 mg | ORAL_TABLET | Freq: Two times a day (BID) | ORAL | 3 refills | Status: DC

## 2021-07-07 NOTE — Patient Instructions (Signed)
Medication Instructions:  Your physician has recommended you make the following change in your medication:   Start Ranexa 500 mg twice daily.  Increase your Isosorbide to 60 mg daily.  Stop Losartan   *If you need a refill on your cardiac medications before your next appointment, please call your pharmacy*   Lab Work: You will have a BMET, BNP and Ddimer done at [facility]. If you have labs (blood work) drawn today and your tests are completely normal, you will receive your results only by: [place] (if you have MyChart) OR A paper copy in the mail If you have any lab test that is abnormal or we need to change your treatment, we will call you to review the results.   Testing/Procedures: A chest x-ray takes a picture of the organs and structures inside the chest, including the heart, lungs, and blood vessels. This test can show several things, including, whether the heart is enlarges; whether fluid is building up in the lungs; and whether pacemaker / defibrillator leads are still in place.    Follow-Up: At [facility], you and your health needs are our priority.  As part of our continuing mission to provide you with exceptional heart care, we have created designated Provider Care Teams.  These Care Teams include your primary Cardiologist (physician) and Advanced Practice Providers (APPs -  Physician Assistants and Nurse Practitioners) who all work together to provide you with the care you need, when you need it.  We recommend signing up for the patient portal called "MyChart".  Sign up information is provided on this After Visit Summary.  MyChart is used to connect with patients for Virtual Visits (Telemedicine).  Patients are able to view lab/test results, encounter notes, upcoming appointments, etc.  Non-urgent messages can be sent to your provider as well.   To learn more about what you can do with MyChart, go to [email or web address].    Your next appointment:   1  month(s)  The format for your next appointment:   In Person  Provider:   Jenne Campus, MD    Other Instructions Ranolazine Extended-Release Tablets What is this medication? RANOLAZINE (ra NOE la zeen) is a heart medication. It is used to treat chronic chest pain (angina). It will not relieve an acute episode of chest pain. This medicine may be used for other purposes; ask your health care provider or pharmacist if you have questions. COMMON BRAND NAME(S): Ranexa What should I tell my care team before I take this medication? They need to know if you have any of these conditions: Heart disease Irregular heartbeat or rhythm Kidney disease Liver disease Low levels of potassium or magnesium in the blood An unusual or allergic reaction to ranolazine, other medications, foods, dyes, or preservatives Pregnant or trying to get pregnant Breast-feeding How should I use this medication? Take this medication by mouth with water. Take it as directed on the prescription label. Do not cut, crush or chew this medication. Swallow the tablets whole. You may take it with or without food. Do not take it with grapefruit juice. Keep taking it unless your care team tells you to stop. Talk to your care team about the use of this medication in children. Special care may be needed. Overdosage: If you think you have taken too much of this medicine contact a poison control center or emergency room at once. NOTE: This medicine is only for you. Do not share this medicine with others. What if I miss a dose?  If you miss a dose, take it as soon as you can. If it is almost time for your next dose, take only that dose. Do not take double or extra doses. What may interact with this medication? Do not take this medicine with any of the following medications: Cerivastatin Certain antibiotics like clarithromycin, rifabutin, rifampin, rifapentine Certain antivirals for hepatitis or HIV Certain medicines used for  cancer treatment Certain medicines for fungal infections like itraconazole, ketoconazole, posaconazole, voriconazole Certain medicines for irregular heart beat like dronedarone Certain medicines for seizures like carbamazepine, fosphenytoin, oxcarbazepine, phenobarbital, phenytoin Cisapride Conivaptan Nefazodone Pimozide St. John's wort Thioridazine This medicine may also interact with the following medications: Certain medicines for depression, anxiety, or psychotic disturbances Certain medicines for cholesterol like atorvastatin, lovastatin, simvastatin Cyclosporine Digoxin Diltiazem Dofetilide Eplerenone Ergot alkaloids like dihydroergotamine, ergonovine, ergotamine, methylergonovine Erythromycin Fluconazole Grapefruit or grapefruit juice Metformin Other medicines that prolong the QT interval (which can cause an abnormal heart rhythm) Sirolimus Tacrolimus Verapamil This list may not describe all possible interactions. Give your health care provider a list of all the medicines, herbs, non-prescription drugs, or dietary supplements you use. Also tell them if you smoke, drink alcohol, or use illegal drugs. Some items may interact with your medicine. What should I watch for while using this medication? Visit your care team for regular checks on your progress. Tell your care team if your symptoms do not start to get better or if they get worse. This medication will not relieve an acute attack of angina or chest pain. You may get drowsy or dizzy. Do not drive, use machinery, or do anything that needs mental alertness until you know how this medication affects you. Do not stand up or sit up quickly, especially if you are an older patient. This reduces the risk of dizzy or fainting spells. Alcohol may interfere with the effects of this medication. Avoid alcoholic drinks. If you are going to need surgery or other procedure, tell your care team that you are using this medication. What side  effects may I notice from receiving this medication? Side effects that you should report to your care team as soon as possible: Allergic reactions--skin rash, itching, hives, swelling of the face, lips, tongue, or throat Burning or tingling sensation in hands or feet Change in vision Heart rhythm changes-- fast or irregular heartbeat, dizziness, feeling faint or lightheaded, chest pain, trouble breathing Low blood pressure--dizziness, feeling faint or lightheaded, blurry vision Ringing in ears Slow heartbeat--dizziness, feeling faint or lightheaded, confusion, trouble breathing, unusual weakness or fatigue Swelling of the ankles, hands, or feet Tremors or shaking Side effects that usually do not require medical attention (report to your care team if they continue or are bothersome): Constipation Dizziness Headache Nausea Upset Stomach This list may not describe all possible side effects. Call your doctor for medical advice about side effects. You may report side effects to FDA at 1-800-FDA-1088. Where should I keep my medication? Keep out of the reach of children and pets. Store at room temperature at 25 degrees C (77 degrees F). Get rid of any unused medication after the expiration date. To get rid of medications that are no longer needed or have expired: Take the medication to a medication take-back program. Check with your pharmacy or law enforcement to find a location. If you cannot return the medication, check the label or package insert to see if the medication should be thrown out in the garbage or flushed down the toilet. If you are not sure,  ask your care team. If it is safe to put it in the trash, empty the medication out of the container. Mix the medication with cat litter, dirt, coffee grounds, or other unwanted substance. Seal the mixture in a bag or container. Put it in the trash. NOTE: This sheet is a summary. It may not cover all possible information. If you have questions  about this medicine, talk to your doctor, pharmacist, or health care provider.  2022 Elsevier/Gold Standard (2020-08-19 00:00:00)

## 2021-07-07 NOTE — Progress Notes (Signed)
Cardiology Office Note:    Date:  07/07/2021   ID:  Shane Massey, DOB 02/27/1941, MRN [id number]  PCP:  Shane Shivers, MD  Cardiologist:  Shane Lindau, MD   Referring MD: Shane Shivers, MD    ASSESSMENT:    1. Angina pectoris ([place])   2. Atherosclerosis of coronary artery bypass graft(s), unspecified, with other forms of angina pectoris ([place])   3. Essential hypertension   4. Ischemic cardiomyopathy   5. Dyslipidemia   6. OSA (obstructive sleep apnea)   7. S/P CABG x 3    PLAN:    In order of problems listed above:  Dyspnea on exertion: I am concerned about his symptoms.  I have already sent him to [facility] for complete blood work including CBC D-dimer and BNP.  He will have other blood work also.  CBC will help me to see if any of his pneumonitis issues are still lingering.  I also will get a chest x-ray PA and lateral view to assess this.  D-dimer will help me assess for any evidence of pulmonary thromboembolism.  This issue could be related to his coronary artery disease also therefore I have stopped his Cozaar and doubled up his isosorbide to 60 mg daily.  I initiated him on Ranexa 500 mg twice daily and educated him about cardiac rehab program at [place] and to see if he qualifies for it when he does better with his symptoms. Essential hypertension: Blood pressure stable.  I am thinking this is an element of anxiety and whitecoat hypertension today overall his blood pressures have been fine at home in the past also at her office too.  Also the above changes in medications should help him. Mixed dyslipidemia: Lipids reviewed.  This is followed by primary care.  I reviewed his numbers and discussed with primary care. Patient and wife had multiple questions which were answered to their satisfaction.  I will review the aforementioned investigation and reports from [facility] once they are available to me and I will discuss with Shane Massey and do  the needful.   Medication Adjustments/Labs and Tests Ordered: Current medicines are reviewed at length with the patient today.  Concerns regarding medicines are outlined above.  No orders of the defined types were placed in this encounter.  No orders of the defined types were placed in this encounter.    No chief complaint on file.    History of Present Illness:    Shane Massey is a 81 y.o. male.  He is a patient of Shane Massey.  He has had coronary intervention and bypass surgery in the past he has history of ischemic cardiomyopathy, essential hypertension and sleep apnea.  He came to the office complaining of some shortness of breath issues and I elected to see him as a working patient.  He has had a stress test recently a few days ago which did not reveal any evidence of ischemia.  His shortness of breath is significant.  No orthopnea or PND.  He was recently told that he had a "walking pneumonia" and treated for this.  At the time of my evaluation, the patient is alert awake oriented and in no distress.  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   Ischemic cardiomyopathy 10/24/2016   Overview:  Ejection fraction 45% in spring 2018   Myocardial infarction [facility])    Near syncope    Nonsustained ventricular tachycardia 07/17/2018   OSA (obstructive sleep apnea)    OSA on CPAP 11/14/2015   CPAP 13    Paroxysmal atrial fibrillation ([place]) 09/09/2019   Restless leg syndrome 11/30/2016   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    Past Surgical History:  Procedure Laterality Date   CATARACT EXTRACTION  2012   COLONOSCOPY  2013   CORONARY  ARTERY BYPASS GRAFT N/A 08/13/2019   Procedure: CORONARY ARTERY BYPASS GRAFTING (CABG) TIMES 3.;  Surgeon: Shane Olds, MD;  Location: [facility];  Service: Open Heart Surgery;  Laterality: N/A;   CORONARY STENT PLACEMENT     heart stent Left 09/2015   HERNIA REPAIR  11/1977   LEFT HEART CATH AND CORONARY ANGIOGRAPHY N/A 08/06/2019   Procedure: LEFT HEART CATH AND CORONARY ANGIOGRAPHY;  Surgeon: Shane Sine, MD;  Location: [facility];  Service: Cardiovascular;  Laterality: N/A;   RADIAL ARTERY HARVEST Left 08/13/2019   Procedure: OPEN RADIAL ARTERY HARVEST;  Surgeon: Shane Olds, MD;  Location: [facility];  Service: Open Heart Surgery;  Laterality: Left;   REPAIR OF PATENT FORAMEN OVALE N/A 08/13/2019   Procedure: CLOSURE OF PATENT FORAMEN OVALE;  Surgeon: Shane Olds, MD;  Location: [facility];  Service: Open Heart Surgery;  Laterality: N/A;   RTC repair  Right 11/03/2020   SURGERY FOR BOWEL ABSCESS  10/1959   TEE WITHOUT CARDIOVERSION N/A 08/13/2019   Procedure: TRANSESOPHAGEAL ECHOCARDIOGRAM (TEE);  Surgeon: Shane Olds, MD;  Location: [facility];  Service: Open Heart Surgery;  Laterality: N/A;    Current Medications: No outpatient medications have been marked as taking for the 07/07/21 encounter (Office Visit) with Shane Massey, Shane Cliche, MD.     Allergies:   Patient has no known allergies.   Social History   Socioeconomic History   Marital status: Married    Spouse name: Not on file   Number of children: Not on file   Years of education: Not on file   Highest education level: Not on file  Occupational History   Not on file  Tobacco Use   Smoking status: Former    Packs/day: 0.50    Years: 4.00    Pack years: 2.00    Types: Cigarettes    Quit date: 06/11/1958    Years since quitting: 63.1   Smokeless tobacco: Former    Types: Chew    Quit date: 12/01/1958  Vaping Use   Vaping Use: Never used  Substance and Sexual Activity   Alcohol use: No    Alcohol/week:  0.0 standard drinks   Drug use: No   Sexual activity: Not on file  Other Topics Concern   Not on file  Social History Narrative   Not on file   Social Determinants of Health   Financial Resource Strain: Not on file  Food Insecurity: Not on file  Transportation Needs: Not on file  Physical Activity: Not on file  Stress: Not on file  Social Connections: Not on file     Family History: The patient's family history includes Asthma in his brother and maternal grandmother; Heart attack in his brother; Heart disease in his brother; Stroke in his  father.  ROS:   Please see the history of present illness.    All other systems reviewed and are negative.  EKGs/Labs/Other Studies Reviewed:    The following studies were reviewed today: EKG reveals sinus bradycardia rightward axis incomplete right bundle branch block and nonspecific ST-T changes   Recent Labs: 04/21/2021: BUN 21; Creatinine, Ser 0.92; Potassium 5.1; Sodium 143  Recent Lipid Panel    Component Value Date/Time   CHOL 128 04/12/2020 0913   TRIG 111 04/12/2020 0913   HDL 48 04/12/2020 0913   CHOLHDL 2.7 04/12/2020 0913   CHOLHDL 2.5 08/13/2019 0603   VLDL 25 08/13/2019 0603   LDLCALC 60 04/12/2020 0913    Physical Exam:    VS:  There were no vitals taken for this visit.    Wt Readings from Last 3 Encounters:  04/19/21 183 lb 12.8 oz (83.4 kg)  04/07/21 185 lb (83.9 kg)  01/23/21 183 lb (83 kg)     GEN: Patient is in no acute distress HEENT: Normal NECK: No JVD; No carotid bruits LYMPHATICS: No lymphadenopathy CARDIAC: Hear sounds regular, 2/6 systolic murmur at the apex. RESPIRATORY:  Clear to auscultation without rales, wheezing or rhonchi  ABDOMEN: Soft, non-tender, non-distended MUSCULOSKELETAL:  No edema; No deformity  SKIN: Warm and dry NEUROLOGIC:  Alert and oriented x 3 PSYCHIATRIC:  Normal affect   Signed, Shane Lindau, MD  07/07/2021 10:03 AM    [place]

## 2021-07-08 DIAGNOSIS — E039 Hypothyroidism, unspecified: Secondary | ICD-10-CM | POA: Diagnosis not present

## 2021-07-08 DIAGNOSIS — J45909 Unspecified asthma, uncomplicated: Secondary | ICD-10-CM | POA: Diagnosis not present

## 2021-07-08 DIAGNOSIS — Z7982 Long term (current) use of aspirin: Secondary | ICD-10-CM | POA: Diagnosis not present

## 2021-07-08 DIAGNOSIS — G2581 Restless legs syndrome: Secondary | ICD-10-CM | POA: Diagnosis not present

## 2021-07-08 DIAGNOSIS — I252 Old myocardial infarction: Secondary | ICD-10-CM | POA: Diagnosis not present

## 2021-07-08 DIAGNOSIS — M1990 Unspecified osteoarthritis, unspecified site: Secondary | ICD-10-CM | POA: Diagnosis not present

## 2021-07-08 DIAGNOSIS — I5032 Chronic diastolic (congestive) heart failure: Secondary | ICD-10-CM | POA: Diagnosis not present

## 2021-07-08 DIAGNOSIS — Z7901 Long term (current) use of anticoagulants: Secondary | ICD-10-CM | POA: Diagnosis not present

## 2021-07-08 DIAGNOSIS — Z79899 Other long term (current) drug therapy: Secondary | ICD-10-CM | POA: Diagnosis not present

## 2021-07-08 DIAGNOSIS — R0602 Shortness of breath: Secondary | ICD-10-CM | POA: Diagnosis not present

## 2021-07-08 DIAGNOSIS — Z7902 Long term (current) use of antithrombotics/antiplatelets: Secondary | ICD-10-CM | POA: Diagnosis not present

## 2021-07-08 DIAGNOSIS — K219 Gastro-esophageal reflux disease without esophagitis: Secondary | ICD-10-CM | POA: Diagnosis not present

## 2021-07-08 DIAGNOSIS — E785 Hyperlipidemia, unspecified: Secondary | ICD-10-CM | POA: Diagnosis not present

## 2021-07-08 DIAGNOSIS — I110 Hypertensive heart disease with heart failure: Secondary | ICD-10-CM | POA: Diagnosis not present

## 2021-07-08 DIAGNOSIS — Z8701 Personal history of pneumonia (recurrent): Secondary | ICD-10-CM | POA: Diagnosis not present

## 2021-07-08 DIAGNOSIS — K449 Diaphragmatic hernia without obstruction or gangrene: Secondary | ICD-10-CM | POA: Diagnosis not present

## 2021-07-08 DIAGNOSIS — I2699 Other pulmonary embolism without acute cor pulmonale: Secondary | ICD-10-CM | POA: Diagnosis not present

## 2021-07-08 DIAGNOSIS — Z20822 Contact with and (suspected) exposure to covid-19: Secondary | ICD-10-CM | POA: Diagnosis not present

## 2021-07-08 DIAGNOSIS — I2510 Atherosclerotic heart disease of native coronary artery without angina pectoris: Secondary | ICD-10-CM | POA: Diagnosis not present

## 2021-07-08 DIAGNOSIS — G4733 Obstructive sleep apnea (adult) (pediatric): Secondary | ICD-10-CM | POA: Diagnosis not present

## 2021-07-11 ENCOUNTER — Telehealth: Payer: Self-pay | Admitting: Pulmonary Disease

## 2021-07-11 NOTE — Telephone Encounter (Signed)
Spoke to patient's spouse, [name] [facility]). [name] stated that patient was recently admitted for PE. She is questioning if this will interfere will PFT that is scheduled 07/21/2021.  Dr. [name], please advise. Thanks  *[name] stated that it is okay to leave detail message with response.

## 2021-07-12 DIAGNOSIS — I2699 Other pulmonary embolism without acute cor pulmonale: Secondary | ICD-10-CM | POA: Diagnosis not present

## 2021-07-12 DIAGNOSIS — Z6829 Body mass index (BMI) 29.0-29.9, adult: Secondary | ICD-10-CM | POA: Diagnosis not present

## 2021-07-12 DIAGNOSIS — Z7689 Persons encountering health services in other specified circumstances: Secondary | ICD-10-CM | POA: Diagnosis not present

## 2021-07-12 DIAGNOSIS — I2510 Atherosclerotic heart disease of native coronary artery without angina pectoris: Secondary | ICD-10-CM | POA: Diagnosis not present

## 2021-07-12 DIAGNOSIS — I10 Essential (primary) hypertension: Secondary | ICD-10-CM | POA: Diagnosis not present

## 2021-07-12 DIAGNOSIS — R7303 Prediabetes: Secondary | ICD-10-CM | POA: Diagnosis not present

## 2021-07-12 DIAGNOSIS — E785 Hyperlipidemia, unspecified: Secondary | ICD-10-CM | POA: Diagnosis not present

## 2021-07-13 ENCOUNTER — Telehealth: Payer: Self-pay | Admitting: Cardiology

## 2021-07-13 NOTE — Telephone Encounter (Signed)
Called patient and informed him of Dr. Wendy Poet recommendations regarding his ranexa medication. Patient stated he would follow Dr. Wendy Poet recommendation and try to stay off the medication.

## 2021-07-13 NOTE — Telephone Encounter (Signed)
Pt c/o medication issue:  1. Name of Medication:  ranolazine (RANEXA) 500 MG 12 hr tablet  2. How are you currently taking this medication (dosage and times per day)?  As prescribed  3. Are you having a reaction (difficulty breathing--STAT)?   4. What is your medication issue?   Patient's wife states his PCP has concerns with the patient starting this medication because he has not has a heart attack. She would like to know whether Dr. Agustin Cree agrees with the patient going on Ranolazine. Please advise.

## 2021-07-13 NOTE — Telephone Encounter (Signed)
I called and spoke with wife. Shane Massey was hospitalized at [facility] last week for PE. He is currently on anti coagulation  Please reschedule the PFTs and office visit to March-April. Thanks

## 2021-07-13 NOTE — Telephone Encounter (Signed)
Called and spoke with [name].  Patient rescheduled for 08/31/21 at 0900 for PFT and 1000 follow up with Dr. [name].  Nothing further at this time.

## 2021-07-14 DIAGNOSIS — G4733 Obstructive sleep apnea (adult) (pediatric): Secondary | ICD-10-CM | POA: Diagnosis not present

## 2021-07-18 ENCOUNTER — Other Ambulatory Visit: Payer: Self-pay | Admitting: Cardiology

## 2021-07-18 NOTE — Telephone Encounter (Signed)
Rx sent 

## 2021-07-20 DIAGNOSIS — I2699 Other pulmonary embolism without acute cor pulmonale: Secondary | ICD-10-CM | POA: Diagnosis not present

## 2021-07-20 DIAGNOSIS — I700 Atherosclerosis of aorta: Secondary | ICD-10-CM | POA: Diagnosis not present

## 2021-07-20 DIAGNOSIS — R109 Unspecified abdominal pain: Secondary | ICD-10-CM | POA: Diagnosis not present

## 2021-07-20 DIAGNOSIS — R1084 Generalized abdominal pain: Secondary | ICD-10-CM | POA: Diagnosis not present

## 2021-07-21 ENCOUNTER — Ambulatory Visit: Payer: PPO | Admitting: Pulmonary Disease

## 2021-07-24 DIAGNOSIS — G4733 Obstructive sleep apnea (adult) (pediatric): Secondary | ICD-10-CM | POA: Diagnosis not present

## 2021-07-28 DIAGNOSIS — I700 Atherosclerosis of aorta: Secondary | ICD-10-CM | POA: Diagnosis not present

## 2021-07-28 DIAGNOSIS — I509 Heart failure, unspecified: Secondary | ICD-10-CM | POA: Diagnosis not present

## 2021-07-28 DIAGNOSIS — I2699 Other pulmonary embolism without acute cor pulmonale: Secondary | ICD-10-CM | POA: Diagnosis not present

## 2021-07-28 DIAGNOSIS — I517 Cardiomegaly: Secondary | ICD-10-CM | POA: Diagnosis not present

## 2021-07-28 DIAGNOSIS — N200 Calculus of kidney: Secondary | ICD-10-CM | POA: Diagnosis not present

## 2021-07-28 DIAGNOSIS — J811 Chronic pulmonary edema: Secondary | ICD-10-CM | POA: Diagnosis not present

## 2021-07-28 DIAGNOSIS — R079 Chest pain, unspecified: Secondary | ICD-10-CM | POA: Diagnosis not present

## 2021-07-28 DIAGNOSIS — R0602 Shortness of breath: Secondary | ICD-10-CM | POA: Diagnosis not present

## 2021-07-28 DIAGNOSIS — Z20822 Contact with and (suspected) exposure to covid-19: Secondary | ICD-10-CM | POA: Diagnosis not present

## 2021-07-28 DIAGNOSIS — I110 Hypertensive heart disease with heart failure: Secondary | ICD-10-CM | POA: Diagnosis not present

## 2021-08-01 ENCOUNTER — Other Ambulatory Visit: Payer: Self-pay

## 2021-08-04 ENCOUNTER — Other Ambulatory Visit: Payer: Self-pay

## 2021-08-04 ENCOUNTER — Ambulatory Visit: Payer: PPO | Admitting: Cardiology

## 2021-08-04 ENCOUNTER — Other Ambulatory Visit: Payer: Self-pay | Admitting: Cardiology

## 2021-08-04 ENCOUNTER — Encounter: Payer: Self-pay | Admitting: Cardiology

## 2021-08-04 VITALS — BP 126/72 | HR 58 | Ht 70.0 in | Wt 191.0 lb

## 2021-08-04 DIAGNOSIS — G4733 Obstructive sleep apnea (adult) (pediatric): Secondary | ICD-10-CM

## 2021-08-04 DIAGNOSIS — I2782 Chronic pulmonary embolism: Secondary | ICD-10-CM

## 2021-08-04 DIAGNOSIS — I2510 Atherosclerotic heart disease of native coronary artery without angina pectoris: Secondary | ICD-10-CM

## 2021-08-04 DIAGNOSIS — J84112 Idiopathic pulmonary fibrosis: Secondary | ICD-10-CM | POA: Diagnosis not present

## 2021-08-04 DIAGNOSIS — I255 Ischemic cardiomyopathy: Secondary | ICD-10-CM

## 2021-08-04 DIAGNOSIS — I25118 Atherosclerotic heart disease of native coronary artery with other forms of angina pectoris: Secondary | ICD-10-CM | POA: Diagnosis not present

## 2021-08-04 DIAGNOSIS — I209 Angina pectoris, unspecified: Secondary | ICD-10-CM | POA: Diagnosis not present

## 2021-08-04 DIAGNOSIS — Z951 Presence of aortocoronary bypass graft: Secondary | ICD-10-CM

## 2021-08-04 DIAGNOSIS — I2699 Other pulmonary embolism without acute cor pulmonale: Secondary | ICD-10-CM | POA: Insufficient documentation

## 2021-08-04 NOTE — Telephone Encounter (Signed)
°*  STAT* If patient is at the pharmacy, call can be transferred to refill team.   1. Which medications need to be refilled? (please list name of each medication and dose if known) ELIQUIS 5 MG TABS tablet  2. Which pharmacy/location (including street and city if local pharmacy) is medication to be sent to? [place], [place]  3. Do they need a 30 day or 90 day supply? 90 day supply

## 2021-08-04 NOTE — Patient Instructions (Signed)
Medication Instructions:  Your physician has recommended you make the following change in your medication: Gave samples of Eliquis today and pt will bring in form for pt assistance to get this medication covered.  *If you need a refill on your cardiac medications before your next appointment, please call your pharmacy*   Lab Work: Your physician recommends that you return for lab work in: Today for Pro BNP, BMP and CBC  If you have labs (blood work) drawn today and your tests are completely normal, you will receive your results only by: MyChart Message (if you have MyChart) OR A paper copy in the mail If you have any lab test that is abnormal or we need to change your treatment, we will call you to review the results.   Testing/Procedures: NONE   Follow-Up: At [facility], you and your health needs are our priority.  As part of our continuing mission to provide you with exceptional heart care, we have created designated Provider Care Teams.  These Care Teams include your primary Cardiologist (physician) and Advanced Practice Providers (APPs -  Physician Assistants and Nurse Practitioners) who all work together to provide you with the care you need, when you need it.  We recommend signing up for the patient portal called "MyChart".  Sign up information is provided on this After Visit Summary.  MyChart is used to connect with patients for Virtual Visits (Telemedicine).  Patients are able to view lab/test results, encounter notes, upcoming appointments, etc.  Non-urgent messages can be sent to your provider as well.   To learn more about what you can do with MyChart, go to [email or web address].    Your next appointment:   1 month(s)  The format for your next appointment:   In Person  Provider:   Jenne Campus, MD    Other Instructions Dr. Wendy Poet phone number is [phone]

## 2021-08-04 NOTE — Addendum Note (Signed)
Addended by: Jerl Santos R on: 08/04/2021 12:03 PM   Modules accepted: Orders

## 2021-08-04 NOTE — Progress Notes (Signed)
Cardiology Office Note:    Date:  08/04/2021   ID:  Shane Massey, DOB 1941/02/19, MRN [id number]  PCP:  Maryella Shivers, MD  Cardiologist:  Jenne Campus, MD    Referring MD: Maryella Shivers, MD   Chief Complaint  Patient presents with   Shortness of Breath    With fast pace walking   Cough    In the morning    History of Present Illness:    Shane Massey is a 81 y.o. male   with past medical history significant for coronary artery disease.  In March 2017 he required PTCA and stenting of the mid LAD he also have history of ischemic cardiomyopathy however ejection fraction determined in January 2020 was normal.  He been complaining of fatigue tiredness for long period time eventually we decided to pursue cardiac catheterization.  That showed 60 to 75% stenosis of left main, 80% stenosis of ramus intermedius, 50% stenosis first diagonal branch on 08/13/2019 he ended up having coronary bypass graft with LIMA to LAD, left radial to second obtuse marginal branch and RIMA to ramus intermedius at the same time he did have PFO closure.  Recovery was complicated by an episode of atrial fibrillation successfully managed with  He has been struggling for a while with shortness of breath he had difficulty finding out what the problem was extensive evaluation has been performed included echocardiogram as well as stress test which was negative eventually D-dimer has been performed which was high CT of the chest showed pulmonary emboli.  End up being admitted to the hospital put on anticoagulation and discharged home however after that he end up 1 more time in the emergency room with shortness of breath no new pulmonary emboli.  He comes today to my office for follow-up overall doing better but still fatigued tired short of breath.  Denies having atypical chest pain tightness squeezing pressure burning chest, he did not have any pain in his leg before.  Past Medical History:  Diagnosis Date   Angina  pectoris ([place]) 09/06/2015   CAD (coronary artery disease) 08/12/2019   CHF (congestive heart failure) (HCC)    Coronary artery disease    Dizziness 06/14/2017   Dyslipidemia 09/06/2015   Dyspnea    Dyspnea on exertion 09/06/2015   Dysrhythmia    Exertional dyspnea 09/06/2015   GERD (gastroesophageal reflux disease)    Hypertension    Hypothyroidism    Idiopathic pulmonary fibrosis ([place]) 04/07/2020   Ischemic cardiomyopathy 10/24/2016   Overview:  Ejection fraction 45% in spring 2018   Myocardial infarction [facility])    Near syncope    Nonsustained ventricular tachycardia 07/17/2018   OSA (obstructive sleep apnea)    OSA on CPAP 11/14/2015   CPAP 13    Paroxysmal atrial fibrillation (HCC) 09/09/2019   Pulmonary embolism ([place])    Restless leg syndrome 11/30/2016   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    Past Surgical History:  Procedure Laterality Date   CATARACT EXTRACTION  2012   COLONOSCOPY  2013   CORONARY ARTERY BYPASS GRAFT N/A 08/13/2019   Procedure: CORONARY ARTERY BYPASS GRAFTING (CABG) TIMES 3.;  Surgeon: Wonda Olds, MD;  Location: [place];  Service: Open Heart Surgery;  Laterality: N/A;   CORONARY STENT PLACEMENT     heart stent Left 09/2015   HERNIA REPAIR  11/1977   LEFT HEART CATH AND CORONARY ANGIOGRAPHY N/A 08/06/2019   Procedure: LEFT HEART CATH AND CORONARY ANGIOGRAPHY;  Surgeon: Troy Sine, MD;  Location: [facility];  Service: Cardiovascular;  Laterality: N/A;   RADIAL ARTERY HARVEST Left 08/13/2019   Procedure: OPEN RADIAL ARTERY HARVEST;  Surgeon: Wonda Olds, MD;  Location: [place];  Service: Open Heart Surgery;  Laterality: Left;   REPAIR OF PATENT FORAMEN OVALE N/A 08/13/2019   Procedure: CLOSURE OF PATENT FORAMEN OVALE;  Surgeon: Wonda Olds, MD;  Location: [place];  Service: Open Heart Surgery;  Laterality: N/A;   RTC repair  Right 11/03/2020   SURGERY FOR BOWEL ABSCESS  10/1959   TEE WITHOUT CARDIOVERSION N/A 08/13/2019   Procedure:  TRANSESOPHAGEAL ECHOCARDIOGRAM (TEE);  Surgeon: Wonda Olds, MD;  Location: [facility];  Service: Open Heart Surgery;  Laterality: N/A;    Current Medications: Current Meds  Medication Sig   ammonium lactate (AMLACTIN) 12 % cream Apply 1 application topically daily.   aspirin EC 81 MG tablet Take 81 mg by mouth daily.   Calcium Carb-Cholecalciferol (CALCIUM 600+D3 PO) Take 1 tablet by mouth daily at 12 noon.   clopidogrel (PLAVIX) 75 MG tablet Take 1 tablet by mouth once daily (Patient taking differently: Take 75 mg by mouth daily.)   dexlansoprazole (DEXILANT) 60 MG capsule Take 60 mg by mouth daily before breakfast.   ELIQUIS 5 MG TABS tablet Take 5 mg by mouth 2 (two) times daily.   furosemide (LASIX) 40 MG tablet TAKE 1 & 1/2 (ONE & ONE-HALF) TABLETS BY MOUTH ONCE DAILY (Patient taking differently: Take 60 mg by mouth daily. TAKE 1 & 1/2 (ONE & ONE-HALF) TABLETS BY MOUTH ONCE DAILY)   levothyroxine (SYNTHROID, LEVOTHROID) 50 MCG tablet Take 50 mcg by mouth daily before breakfast.   metoprolol succinate (TOPROL-XL) 25 MG 24 hr tablet Take 1 tablet (25 mg total) by mouth daily.   Multiple Vitamin (MULTIVITAMIN WITH MINERALS) TABS tablet Take 1 tablet by mouth daily at 12 noon. Unknown strength per patient   nitroGLYCERIN (NITROSTAT) 0.4 MG SL tablet Place 1 tablet (0.4 mg total) under the tongue every 5 (five) minutes as needed for chest pain.   potassium chloride (KLOR-CON) 10 MEQ tablet Take 1 tablet by mouth once daily   pramipexole (MIRAPEX) 1.5 MG tablet Take 1.5 mg by mouth at bedtime.    rosuvastatin (CRESTOR) 20 MG tablet Take 1 tablet (20 mg total) by mouth daily.     Allergies:   Patient has no known allergies.   Social History   Socioeconomic History   Marital status: Married    Spouse name: Not on file   Number of children: Not on file   Years of education: Not on file   Highest education level: Not on file  Occupational History   Not on file  Tobacco Use   Smoking  status: Former    Packs/day: 0.50    Years: 4.00    Pack years: 2.00    Types: Cigarettes    Quit date: 06/11/1958    Years since quitting: 63.1   Smokeless tobacco: Former    Types: Chew    Quit date: 12/01/1958  Vaping Use   Vaping Use: Never used  Substance and Sexual Activity   Alcohol use: No    Alcohol/week: 0.0 standard drinks   Drug use: No   Sexual activity: Not on file  Other Topics Concern   Not on file  Social History Narrative   Not on file   Social Determinants of Health   Financial Resource Strain: Not on file  Food Insecurity: Not on file  Transportation Needs: Not on file  Physical Activity: Not on file  Stress: Not on file  Social Connections: Not on file     Family History: The patient's family history includes Asthma in his brother and maternal grandmother; Heart attack in his brother; Heart disease in his brother; Stroke in his father. ROS:   Please see the history of present illness.    All 14 point review of systems negative except as described per history of present illness  EKGs/Labs/Other Studies Reviewed:      Recent Labs: 04/21/2021: BUN 21; Creatinine, Ser 0.92; Potassium 5.1; Sodium 143  Recent Lipid Panel    Component Value Date/Time   CHOL 128 04/12/2020 0913   TRIG 111 04/12/2020 0913   HDL 48 04/12/2020 0913   CHOLHDL 2.7 04/12/2020 0913   CHOLHDL 2.5 08/13/2019 0603   VLDL 25 08/13/2019 0603   LDLCALC 60 04/12/2020 0913    Physical Exam:    VS:  BP 126/72 (BP Location: Left Arm, Patient Position: Sitting)    Pulse (!) 58    Ht 5\' 10"  (1.778 m)    Wt 191 lb (86.6 kg)    SpO2 94%    BMI 27.41 kg/m     Wt Readings from Last 3 Encounters:  08/04/21 191 lb (86.6 kg)  07/07/21 191 lb 9.6 oz (86.9 kg)  04/19/21 183 lb 12.8 oz (83.4 kg)     GEN:  Well nourished, well developed in no acute distress HEENT: Normal NECK: No JVD; No carotid bruits LYMPHATICS: No lymphadenopathy CARDIAC: RRR, no murmurs, no rubs, no  gallops RESPIRATORY:  Clear to auscultation without rales, wheezing or rhonchi  ABDOMEN: Soft, non-tender, non-distended MUSCULOSKELETAL:  No edema; No deformity  SKIN: Warm and dry LOWER EXTREMITIES: no swelling NEUROLOGIC:  Alert and oriented x 3 PSYCHIATRIC:  Normal affect   ASSESSMENT:    1. Ischemic cardiomyopathy   2. Other chronic pulmonary embolism without acute cor pulmonale (HCC)   3. Coronary artery disease involving native heart without angina pectoris, unspecified vessel or lesion type   4. Coronary artery disease involving native coronary artery of native heart with other form of angina pectoris ([place])   5. Angina pectoris ([place])   6. OSA (obstructive sleep apnea)   7. Idiopathic pulmonary fibrosis ([place])   8. S/P CABG x 3    PLAN:    In order of problems listed above:  Pulmonary emboli or new discovery.  He is being anticoagulant which I will continue.  Echocardiogram reviewed showing no right ventricle strength.  Still complaining of having shortness of breath.  I did review CT done at her last time in the emergency room there was description of pulmonary edema.  He does not look congested on the physical exam he does have some faint crackles in both bases I am worried this is fibrosis.  I will check proBNP to see if there is any component of congestive heart failure can improve.  He symptomatology could be related to pulmonary emboli and recovered from it he may need I will be more time to simply improve overall. Coronary disease stable from that point review.  I did review stress test done recently showing no evidence of ischemia. Idiopathic pulmonary fibrosis probably contributing to his symptomatology.  If above mentioned work-up will be unrevealing he will be referred to pulmonary. Dyslipidemia on statin which I will continue.  His last K PN showing me his LDL of 66 HDL 49 good cholesterol profile.  Continue present management  Medication Adjustments/Labs and Tests  Ordered: Current medicines are reviewed at length with the patient today.  Concerns regarding medicines are outlined above.  No orders of the defined types were placed in this encounter.  Medication changes: No orders of the defined types were placed in this encounter.   Signed, Park Liter, MD, [facility] 08/04/2021 11:44 AM    [place]

## 2021-08-05 LAB — CBC
Hematocrit: 35.4 % — ABNORMAL LOW (ref 37.5–51.0)
Hemoglobin: 11.6 g/dL — ABNORMAL LOW (ref 13.0–17.7)
MCH: 30.8 pg (ref 26.6–33.0)
MCHC: 32.8 g/dL (ref 31.5–35.7)
MCV: 94 fL (ref 79–97)
Platelets: 218 10*3/uL (ref 150–450)
RBC: 3.77 x10E6/uL — ABNORMAL LOW (ref 4.14–5.80)
RDW: 13.2 % (ref 11.6–15.4)
WBC: 4.9 10*3/uL (ref 3.4–10.8)

## 2021-08-05 LAB — BASIC METABOLIC PANEL
BUN/Creatinine Ratio: 17 (ref 10–24)
BUN: 15 mg/dL (ref 8–27)
CO2: 23 mmol/L (ref 20–29)
Calcium: 9.5 mg/dL (ref 8.6–10.2)
Chloride: 105 mmol/L (ref 96–106)
Creatinine, Ser: 0.87 mg/dL (ref 0.76–1.27)
Glucose: 101 mg/dL — ABNORMAL HIGH (ref 70–99)
Potassium: 4.4 mmol/L (ref 3.5–5.2)
Sodium: 142 mmol/L (ref 134–144)
eGFR: 88 mL/min/{1.73_m2} (ref >59)

## 2021-08-05 LAB — PRO B NATRIURETIC PEPTIDE: NT-Pro BNP: 479 pg/mL (ref 0–486)

## 2021-08-07 ENCOUNTER — Other Ambulatory Visit: Payer: Self-pay

## 2021-08-07 DIAGNOSIS — I2699 Other pulmonary embolism without acute cor pulmonale: Secondary | ICD-10-CM | POA: Diagnosis not present

## 2021-08-07 DIAGNOSIS — Z6829 Body mass index (BMI) 29.0-29.9, adult: Secondary | ICD-10-CM | POA: Diagnosis not present

## 2021-08-07 DIAGNOSIS — R0609 Other forms of dyspnea: Secondary | ICD-10-CM | POA: Diagnosis not present

## 2021-08-07 MED ORDER — ISOSORBIDE MONONITRATE ER 30 MG PO TB24: 30.0000 mg | ORAL_TABLET | Freq: Every day | ORAL | 1 refills | Status: DC

## 2021-08-09 ENCOUNTER — Telehealth: Payer: Self-pay

## 2021-08-09 DIAGNOSIS — R7303 Prediabetes: Secondary | ICD-10-CM | POA: Diagnosis not present

## 2021-08-09 DIAGNOSIS — I10 Essential (primary) hypertension: Secondary | ICD-10-CM | POA: Diagnosis not present

## 2021-08-09 DIAGNOSIS — E785 Hyperlipidemia, unspecified: Secondary | ICD-10-CM | POA: Diagnosis not present

## 2021-08-09 DIAGNOSIS — I2510 Atherosclerotic heart disease of native coronary artery without angina pectoris: Secondary | ICD-10-CM | POA: Diagnosis not present

## 2021-08-09 NOTE — Telephone Encounter (Signed)
-----   Message from Park Liter, MD sent at 08/07/2021  2:23 PM EST ----- ?All labs are looking good, no evidence of significant congestive heart failure.  I think his symptomatology meaning shortness of breath is related to recent PE.  We will continue monitoring ?

## 2021-08-09 NOTE — Telephone Encounter (Signed)
Spoke with patient wife Edith(on DPR) notified of results ?

## 2021-08-11 DIAGNOSIS — G4733 Obstructive sleep apnea (adult) (pediatric): Secondary | ICD-10-CM | POA: Diagnosis not present

## 2021-08-16 ENCOUNTER — Telehealth: Payer: Self-pay | Admitting: Cardiology

## 2021-08-16 ENCOUNTER — Other Ambulatory Visit: Payer: Self-pay | Admitting: Cardiology

## 2021-08-16 MED ORDER — APIXABAN 5 MG PO TABS: 5.0000 mg | ORAL_TABLET | Freq: Two times a day (BID) | ORAL | 3 refills | Status: DC

## 2021-08-16 NOTE — Telephone Encounter (Signed)
Patient wife called stating she contact his insurance company and everything is okay.  If a script for Eliquis can be called into [facility] for 90 days.  ?

## 2021-08-28 ENCOUNTER — Telehealth: Payer: Self-pay | Admitting: Cardiology

## 2021-08-28 NOTE — Telephone Encounter (Signed)
Pt c/o medication issue: ? ?1. Name of Medication: isosorbide mononitrate (IMDUR) 30 MG 24 hr tablet ? ?2. How are you currently taking this medication (dosage and times per day)? 2x daily ? ?3. Are you having a reaction (difficulty breathing--STAT)? no ? ?4. What is your medication issue? Patient's wife states that Dr. Agustin Cree increased this medication to 2x daily at last OV so the patient is almost out of medication. I don't see on Dr. Wendy Poet last [place] note where the medication was increased. Please advise.   ?

## 2021-08-30 MED ORDER — ISOSORBIDE MONONITRATE ER 30 MG PO TB24: ORAL_TABLET | ORAL | 1 refills | Status: DC

## 2021-08-30 NOTE — Telephone Encounter (Signed)
Per Dr. Agustin Cree: ? ?Park Liter, MD  You 22 hours ago (3:01 PM)  ? ?Please do refill on this medication   ? ?You  Park Liter, MD Yesterday (9:50 AM)  ? ? ?Medication sent changing the frequency per Dr. Agustin Cree on previous message.  ?

## 2021-08-31 ENCOUNTER — Ambulatory Visit: Payer: PPO | Admitting: Pulmonary Disease

## 2021-09-04 DIAGNOSIS — Z1331 Encounter for screening for depression: Secondary | ICD-10-CM | POA: Diagnosis not present

## 2021-09-04 DIAGNOSIS — Z1339 Encounter for screening examination for other mental health and behavioral disorders: Secondary | ICD-10-CM | POA: Diagnosis not present

## 2021-09-04 DIAGNOSIS — Z136 Encounter for screening for cardiovascular disorders: Secondary | ICD-10-CM | POA: Diagnosis not present

## 2021-09-04 DIAGNOSIS — Z0000 Encounter for general adult medical examination without abnormal findings: Secondary | ICD-10-CM | POA: Diagnosis not present

## 2021-09-04 DIAGNOSIS — Z139 Encounter for screening, unspecified: Secondary | ICD-10-CM | POA: Diagnosis not present

## 2021-09-04 DIAGNOSIS — Z7189 Other specified counseling: Secondary | ICD-10-CM | POA: Diagnosis not present

## 2021-09-07 ENCOUNTER — Ambulatory Visit: Payer: PPO | Admitting: Primary Care

## 2021-09-07 ENCOUNTER — Telehealth: Payer: Self-pay | Admitting: Primary Care

## 2021-09-07 ENCOUNTER — Encounter: Payer: Self-pay | Admitting: Primary Care

## 2021-09-07 ENCOUNTER — Ambulatory Visit (INDEPENDENT_AMBULATORY_CARE_PROVIDER_SITE_OTHER): Payer: PPO | Admitting: Pulmonary Disease

## 2021-09-07 DIAGNOSIS — J849 Interstitial pulmonary disease, unspecified: Secondary | ICD-10-CM

## 2021-09-07 DIAGNOSIS — J84112 Idiopathic pulmonary fibrosis: Secondary | ICD-10-CM

## 2021-09-07 DIAGNOSIS — I2782 Chronic pulmonary embolism: Secondary | ICD-10-CM

## 2021-09-07 DIAGNOSIS — J8409 Other alveolar and parieto-alveolar conditions: Secondary | ICD-10-CM

## 2021-09-07 HISTORY — DX: Other alveolar and parieto-alveolar conditions: J84.09

## 2021-09-07 LAB — PULMONARY FUNCTION TEST
DL/VA % pred: 98 %
DL/VA: 3.87 ml/min/mmHg/L
DLCO cor % pred: 69 %
DLCO cor: 16.63 ml/min/mmHg
DLCO unc % pred: 62 %
DLCO unc: 15.03 ml/min/mmHg
FEF 25-75 Post: 3.12 L/sec
FEF 25-75 Pre: 1.49 L/sec
FEF2575-%Change-Post: 109 %
FEF2575-%Pred-Post: 157 %
FEF2575-%Pred-Pre: 75 %
FEV1-%Change-Post: 14 %
FEV1-%Pred-Post: 72 %
FEV1-%Pred-Pre: 63 %
FEV1-Post: 2.04 L
FEV1-Pre: 1.78 L
FEV1FVC-%Change-Post: 6 %
FEV1FVC-%Pred-Pre: 107 %
FEV6-%Change-Post: 7 %
FEV6-%Pred-Post: 67 %
FEV6-%Pred-Pre: 62 %
FEV6-Post: 2.47 L
FEV6-Pre: 2.29 L
FEV6FVC-%Pred-Post: 107 %
FEV6FVC-%Pred-Pre: 107 %
FVC-%Change-Post: 7 %
FVC-%Pred-Post: 62 %
FVC-%Pred-Pre: 58 %
FVC-Post: 2.47 L
FVC-Pre: 2.29 L
Post FEV1/FVC ratio: 82 %
Post FEV6/FVC ratio: 100 %
Pre FEV1/FVC ratio: 78 %
Pre FEV6/FVC Ratio: 100 %
RV % pred: 83 %
RV: 2.13 L
TLC % pred: 66 %
TLC: 4.58 L

## 2021-09-07 LAB — NITRIC OXIDE: Nitric Oxide: 22

## 2021-09-07 MED ORDER — ALBUTEROL SULFATE HFA 108 (90 BASE) MCG/ACT IN AERS: 2.0000 | INHALATION_SPRAY | Freq: Four times a day (QID) | RESPIRATORY_TRACT | 6 refills | Status: DC | PRN

## 2021-09-07 NOTE — Telephone Encounter (Signed)
error 

## 2021-09-07 NOTE — Patient Instructions (Addendum)
No changes in appearance of interstitial lung disease appreciated on CT imaging in December 2022. Pulmonary function testing was mostly stable compared to October 2021, some decline in FEV1 and DLCO (diffusion capacity). FENO was normal.  ? ?ANA lab test was positive which suggests there could be an underlying autoimmune condition causing lung changes. It was a weak titer level and could be falsely positive. 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 You need to wear an N95 or respiratory any time you work with wood dust  ? ?Dyspnea is multifactorial d/t recent PE, ILD changes in lung and degree of deconditioning/muscle weakness ? ?I am going to discuss referral to rheumatology and neurology with Dr. [name] and get his recommendations  ? ?Orders: ?Simple walk test ?FENO re: cough  ? ?Rx: ?Albuterol 2 puffs every 4-6 hours as needed for shortness of breath/wheezing  ? ?Follow-up: ?3 months with Dr. [name]  ? ? ? ?

## 2021-09-07 NOTE — Assessment & Plan Note (Addendum)
-   Patient gets winded easily, legs give out when walking. He has a morning cough with clear mucus. No changes in appearance of ILD appreciated on CT imaging in December 2022. Pulmonary function testing was mostly stable compared to October 2021, some decline in FEV1 and DLCO (diffusion capacity). He did have positive bronchodilator response of testing. FENO was normal at 22. ANA lab test was positive which suggests there could be an underlying autoimmune condition causing lung changes. It was a weak titer level and could be falsely positive. Dyspnea is multifactorial d/t recent PE, ILD changes in lung and degree of deconditioning/muscle weakness. I suspect ILD lung changes are largely from previous wood working and possible asbestosis exposure. Advised he needs to wear an N95 mask or respirator any time he works with wood dust. I am going to discuss referral to rheumatology and neurology with Dr. [name] and get his recommendations. Sending in trial Albuterol hfa 2 puffs q 6 hours. FU in 3 months or sooner.  ?

## 2021-09-07 NOTE — Progress Notes (Signed)
? ?'@Patient'$  ID: Shane Massey, male    DOB: 02/16/1941, 81 y.o.   MRN: [id number] ? ?Chief Complaint  ?Patient presents with  ? Follow-up  ?  F/U after PFT. States his SOB has worsen since last visit. Notices the increase with exertion.   ? ? ?Referring provider: ?Maryella Shivers, MD ? ?HPI: ?81 year old male, former smoker.  Past medical history significant for idiopathic pulmonary fibrosis, obstructive sleep apnea on CPAP, hypertension, coronary artery disease status post CABG x3, ischemic cardiomyopathy, paroxysmal A-fib, pulmonary embolism, hypothyroidism, GERD. ? ?Previous [facility] pulmonary encounter: ?04/19/21- Dr. 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 Suspect his dyspnea is multifactorial from cardiac issues and interstitial lung disease.  PFTs show mild restriction and diffusion impairment consistent with ILD ? ?Etiology of ILD is not clear but he does have some asbestos exposure in the past.  There are no signs and symptoms of connective tissue disease ?  ?I will repeat the high-res CT and PFTs to see if this change since her last assessment ?Check basic CTD profile including ANA, rheumatoid factor and CCP ?  ?Plan/Recommendations: ?ANA, rheumatoid factor, CCP ?Repeat high-res CT, PFT ? ?09/07/2021- Interim hx  ?Patient presents today for follow-up ILD with PFTs. Accompanied by his wife. HRCT in December 2022 showed stable appearance of the lungs compatible with ILD. ANA was weakly positive. He was hospitalized in January 2023 for PE. Currently anticoagulated. Breathing is some worse. He is struggling more recently. He has leg weakness and fatigue. When he first gets up to walk his legs feel as if they are going give out. He is able to cut wood standing still without issues. He has a morning cough. Some wheezing which has been observed by his wife. No chest  tightness. 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 He previously did a lot of wood work for several years in his shop as a hobby, if he wore a mask at all it was a regular one and not a respirator. ? ? ?Data Reviewed: ?Imaging: ?CT chest [place] 08/12/2015-bilateral interstitial changes with reticulation and groundglass opacities ?  ?High-res CT 03/14/2020- Patchy confluent mild-to-moderate subpleural reticulation and ground-glass opacity in ?both lungs with associated minimal traction bronchiolectasis and architectural distortion.  Alternate diagnosis.  No progression since 2017 ?I have reviewed the images personally ?  ?PFTs: ?03/18/2020 ?FVC 2.80 [61%], FEV1 2.37 [79%], F/F 85, TLC 4.70 (66%), DLCO 18.80 [75%] ?Mild restriction diffusion impairment ? ?09/07/2021 ?FVC 2.47 (62%), FEV1 2.04 (72%), ratio 82, TLC 4.58 (66%), DLCOcor 16.63 (69%) ? ?Cardiac testing:  ?Echocardiogram 07/08/21>> overall left ventricular systolic function is low-normal with EF between 50-55%. No regional wall motion abnormalities were noted. The right ventricle is mildly enlarged. The left atrium is moderately dilated.  ? ? ?No Known Allergies ? ?Immunization History  ?Administered Date(s) Administered  ? Fluad Quad(high Dose 65+) 03/11/2020  ? Influenza Whole 04/01/2016  ? Influenza-Unspecified 02/21/2015  ? Moderna SARS-COV2 Booster Vaccination 04/29/2020  ? Moderna Sars-Covid-2 Vaccination 07/16/2019, 08/28/2019  ? Zoster Recombinat (Shingrix) 04/15/2019  ? ? ?Past Medical History:  ?Diagnosis Date  ? Angina pectoris ([place]) 09/06/2015  ? CAD (coronary artery disease) 08/12/2019  ? CHF (congestive heart failure) ([place])   ? Coronary artery disease   ? Dizziness 06/14/2017  ? Dyslipidemia 09/06/2015  ? Dyspnea   ? Dyspnea on exertion 09/06/2015  ? Dysrhythmia   ?  Exertional dyspnea 09/06/2015  ? GERD (gastroesophageal reflux disease)   ? Hypertension   ? Hypothyroidism   ? 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 Ischemic  cardiomyopathy 10/24/2016  ? Overview:  Ejection fraction 45% in spring 2018  ? Myocardial infarction [facility])   ? Near syncope   ? Nonsustained ventricular tachycardia 07/17/2018  ? OSA (obstructive sleep apnea)   ? OSA on CPAP 11/14/2015  ? CPAP 13   ? Paroxysmal atrial fibrillation ([place]) 09/09/2019  ? Pulmonary embolism ([place])   ? Restless leg syndrome 11/30/2016  ? 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 ? ?Tobacco History: ?Social History  ? ?Tobacco Use  ?Smoking Status Former  ? Packs/day: 0.50  ? Years: 4.00  ? Pack years: 2.00  ? Types: Cigarettes  ? Quit date: 06/11/1958  ? Years since quitting: 63.2  ?Smokeless Tobacco Former  ? Types: Chew  ? Quit date: 12/01/1958  ? ?Counseling given: Not Answered ? ? ?Outpatient Medications Prior to Visit  ?Medication Sig Dispense Refill  ? ammonium lactate (AMLACTIN) 12 % cream Apply 1 application topically daily.    ? apixaban (ELIQUIS) 5 MG TABS tablet Take 1 tablet (5 mg total) by mouth 2 (two) times daily. 180 tablet 3  ? Calcium Carb-Cholecalciferol (CALCIUM 600+D3 PO) Take 1 tablet by mouth daily at 12 noon.    ? clopidogrel (PLAVIX) 75 MG tablet Take 1 tablet by mouth once daily (Patient taking differently: Take 75 mg by mouth daily.) 90 tablet 1  ? dexlansoprazole (DEXILANT) 60 MG capsule Take 60 mg by mouth daily before breakfast.    ? furosemide (LASIX) 40 MG tablet TAKE 1 & 1/2 (ONE & ONE-HALF) TABLETS BY MOUTH ONCE DAILY 135 tablet 0  ? isosorbide mononitrate (IMDUR) 30 MG 24 hr tablet Take one tablet twice daily. 180 tablet 1  ? levothyroxine (SYNTHROID, LEVOTHROID) 50 MCG tablet Take 50 mcg by mouth daily before breakfast.    ? Multiple Vitamin (MULTIVITAMIN WITH MINERALS) TABS tablet Take 1 tablet by mouth daily at 12 noon. Unknown strength per patient    ? nitroGLYCERIN (NITROSTAT) 0.4 MG SL tablet Place 1 tablet (0.4 mg total) under the tongue every 5 (five) minutes as needed for chest pain. 24 tablet 3  ? potassium chloride (KLOR-CON) 10 MEQ tablet Take 1 tablet by  mouth once daily 90 tablet 3  ? pramipexole (MIRAPEX) 1.5 MG tablet Take 1.5 mg by mouth at bedtime.     ? ranolazine (RANEXA) 500 MG 12 hr tablet Take 1 tablet (500 mg total) by mouth 2 (two) times daily. 180 tablet 3  ? rosuvastatin (CRESTOR) 20 MG tablet Take 1 tablet (20 mg total) by mouth daily. 90 tablet 3  ? aspirin EC 81 MG tablet Take 81 mg by mouth daily.    ? losartan (COZAAR) 25 MG tablet TAKE 1 TABLET BY MOUTH IN THE MORNING AND AT BEDTIME 60 tablet 11  ? metoprolol succinate (TOPROL-XL) 25 MG 24 hr tablet Take 1 tablet (25 mg total) by mouth daily. 90 tablet 3  ? ?No facility-administered medications prior to visit.  ? ? ? ? ?Review of Systems ? ?Review of Systems ? ? ?Physical Exam ? ?BP 124/62   Pulse 62   Ht '5\' 9"'$  (1.753 m)   Wt 191 lb 12.8 oz (87 kg)   SpO2 98% Comment: on RA  BMI 28.32 kg/m?  ?Physical Exam ?Constitutional:   ?   General: He is not in acute distress. ?  Appearance: Normal appearance. He is not ill-appearing.  ?HENT:  ?   Head: Normocephalic and atraumatic.  ?   Mouth/Throat:  ?   Mouth: Mucous membranes are moist.  ?   Pharynx: Oropharynx is clear.  ?Cardiovascular:  ?   Rate and Rhythm: Normal rate and regular rhythm.  ?Pulmonary:  ?   Effort: Pulmonary effort is normal.  ?   Breath sounds: Rales present.  ?   Comments: Rales middle/lower lobes bilaterally  ?Musculoskeletal:     ?   General: Normal range of motion.  ?Skin: ?   General: Skin is warm and dry.  ?Neurological:  ?   General: No focal deficit present.  ?   Mental Status: He is alert and oriented to person, place, and time. Mental status is at baseline.  ?Psychiatric:     ?   Mood and Affect: Mood normal.     ?   Behavior: Behavior normal.     ?   Thought Content: Thought content normal.     ?   Judgment: Judgment normal.  ?  ? ?Lab Results: ? ?CBC ?   ?Component Value Date/Time  ? WBC 4.9 08/04/2021 1204  ? WBC 7.6 08/17/2019 0239  ? RBC 3.77 (L) 08/04/2021 1204  ? RBC 2.93 (L) 08/17/2019 0239  ? HGB 11.6 (L)  08/04/2021 1204  ? HCT 35.4 (L) 08/04/2021 1204  ? PLT 218 08/04/2021 1204  ? MCV 94 08/04/2021 1204  ? MCH 30.8 08/04/2021 1204  ? MCH 31.1 08/17/2019 0239  ? MCHC 32.8 08/04/2021 1204  ? MCHC 33.3 08/17/2019 0239  ? RDW

## 2021-09-07 NOTE — Assessment & Plan Note (Addendum)
-   Hospitalized in January 2023 for PE ?- Continue Eliquis '5mg'$  twice daily ?- Oxygen levels did not desaturation on simple walk test today. O2 97% after walking 243f.  ?

## 2021-09-07 NOTE — Progress Notes (Signed)
PFT done today. 

## 2021-09-08 ENCOUNTER — Ambulatory Visit: Payer: PPO | Admitting: Cardiology

## 2021-09-09 DIAGNOSIS — I2699 Other pulmonary embolism without acute cor pulmonale: Secondary | ICD-10-CM | POA: Diagnosis not present

## 2021-09-09 DIAGNOSIS — M47816 Spondylosis without myelopathy or radiculopathy, lumbar region: Secondary | ICD-10-CM | POA: Diagnosis not present

## 2021-09-14 ENCOUNTER — Telehealth: Payer: Self-pay | Admitting: Pulmonary Disease

## 2021-09-15 NOTE — Telephone Encounter (Signed)
Called patient and he is wanting an update on the referral to rheumatology. I noted in Beths notes she was going to talk to Dr [name] about this.  ?I advised patient that I would get with [facility] and find out the status about this referral.  ? ?Beth please advise  ?

## 2021-09-18 NOTE — Telephone Encounter (Signed)
Called and spoke with patient's wife Shane Massey. I made her aware that Dr. [name] is out of the office until at least next week and [name] is waiting on his response. While on the phone, she wanted to know why the rheumatology referral. I explained to her that [name] was concerned about the positive ANA lab results. I also explained to her what a rheumatologist is.  ? ?She stated that he is not currently having any joint pain besides his usual arthritic pain.  ? ?They will continue to wait to hear Dr. [name] recommendations. She is aware that if anything changes to call us back.  ? ?Nothing further needed at time of call.  ?

## 2021-09-18 NOTE — Telephone Encounter (Signed)
I sent Dr. [name] a message and am awaiting response. We can go ahead and refer to rheumatology if he is having joint pain and also neurology for muscle weakness

## 2021-09-21 DIAGNOSIS — H02403 Unspecified ptosis of bilateral eyelids: Secondary | ICD-10-CM | POA: Diagnosis not present

## 2021-09-21 DIAGNOSIS — H02831 Dermatochalasis of right upper eyelid: Secondary | ICD-10-CM | POA: Diagnosis not present

## 2021-09-21 DIAGNOSIS — H57813 Brow ptosis, bilateral: Secondary | ICD-10-CM | POA: Diagnosis not present

## 2021-09-21 DIAGNOSIS — H02834 Dermatochalasis of left upper eyelid: Secondary | ICD-10-CM | POA: Diagnosis not present

## 2021-09-26 ENCOUNTER — Telehealth: Payer: Self-pay | Admitting: Cardiology

## 2021-09-26 NOTE — Telephone Encounter (Signed)
Pt's wife is wanting to know if pt has to be on Eluquis for 6 months before having any type of surgery. Please advise ?

## 2021-09-26 NOTE — Telephone Encounter (Signed)
Left message for patient to call back  

## 2021-09-27 ENCOUNTER — Telehealth: Payer: Self-pay

## 2021-09-27 NOTE — Telephone Encounter (Signed)
Advised that we are waiting on recommendations from Dr. Agustin Cree. ?

## 2021-09-27 NOTE — Telephone Encounter (Signed)
Called the patient's wife and informed her that I would send Dr. Agustin Cree a message to find out how long Shane Massey needs to be off Eliquis and Plavix for his droopy eyelid surgery. ?

## 2021-09-27 NOTE — Telephone Encounter (Signed)
Follow Up: ? ? ? ? ?Patient's wife is calling back. ?

## 2021-09-28 ENCOUNTER — Telehealth: Payer: Self-pay

## 2021-09-28 NOTE — Telephone Encounter (Signed)
-----   Message from Park Liter, MD sent at 09/28/2021  8:45 AM EDT ----- ?Patient has history of pulmonary emboli which was in January of this year we should not interrupt anticoagulation for at least 6 months thereafter.  On top of that decision about stopping Plavix and Eliquis should be made by surgeon not by me, surgeon only knows how potentially bloody that procedure can be aware of the potential complications.  Regardless from my point of view unless is an emergency his Eliquis should not be interrupted for at least 6 months after PE ?----- Message ----- ?From: Louie Casa, RN ?Sent: 09/27/2021   4:33 PM EDT ?To: Park Liter, MD ? ?Patient is going to be having surgery to repair his droopy eyelid on June 9th. Please advise how long the patient should be off of Eliquis and Plavix for the surgery. ? ?Thanks ? ? ?

## 2021-09-28 NOTE — Telephone Encounter (Signed)
Called the patient to inform them of Dr. Wendy Poet recommendation regarding stopping his Eliquis and Plavix for a surgical procedure. The patient did not answer. Left message for patient to call back. ?

## 2021-09-28 NOTE — Telephone Encounter (Signed)
Left VM for pt and wife regarding Eliquis therapy- per Dr. Wendy Poet note. Encouraged to call with any questions or concerns. ?

## 2021-09-29 ENCOUNTER — Telehealth: Payer: Self-pay

## 2021-09-29 ENCOUNTER — Telehealth: Payer: Self-pay | Admitting: Primary Care

## 2021-09-29 NOTE — Telephone Encounter (Signed)
Called patient's wife and explained Dr. Wendy Poet recommendation below: ? ?"Patient has history of pulmonary emboli which was in January of this year we should not interrupt anticoagulation for at least 6 months thereafter.  On top of that decision about stopping Plavix and Eliquis should be made by surgeon not by me, surgeon only knows how potentially bloody that procedure can be aware of the potential complications.  Regardless from my point of view unless is an emergency his Eliquis should not be interrupted for at least 6 months after PE" ? ?Patient's wife stated that she would cancel the patient's procedure until after July and about a month before the procedure she would have the surgeon fax over a preop clearance form so we can decide how long to hold his Eliquis and Plavix. The patient's wife had no further questions at this time. ?

## 2021-09-29 NOTE — Telephone Encounter (Signed)
Called and spoke with patient's wife per DPR to let her know results of ANA and to see if he was still having symptoms of muscle pain. Wife states that he is only having shortness of breath. States that his legs still get tired sometimes when walking. She said she would talk to him and if they felt like they wanted the referral they would call back Monday. Nothing further needed at this time.  ?

## 2021-09-29 NOTE — Telephone Encounter (Signed)
Dr. [name] see staff message I sent you on this patient end of March. I wanted to get your input on referral to rheumatology and neurology.

## 2021-09-29 NOTE — Telephone Encounter (Signed)
Routing to both [place] and Dr. [name] for an update. ?

## 2021-09-29 NOTE — Telephone Encounter (Signed)
-----   Message from Park Liter, MD sent at 09/28/2021  8:45 AM EDT ----- ?Patient has history of pulmonary emboli which was in January of this year we should not interrupt anticoagulation for at least 6 months thereafter.  On top of that decision about stopping Plavix and Eliquis should be made by surgeon not by me, surgeon only knows how potentially bloody that procedure can be aware of the potential complications.  Regardless from my point of view unless is an emergency his Eliquis should not be interrupted for at least 6 months after PE ?----- Message ----- ?From: Louie Casa, RN ?Sent: 09/27/2021   4:33 PM EDT ?To: Park Liter, MD ? ?Patient is going to be having surgery to repair his droopy eyelid on June 9th. Please advise how long the patient should be off of Eliquis and Plavix for the surgery. ? ?Thanks ? ? ?

## 2021-09-29 NOTE — Telephone Encounter (Signed)
Sorry for the delay as I am catching up with messages from my time on vacation ?ANA is very low and probably nonspecific but if he is continuing to have symptoms then agree with both rheumatology and neurology evaluation ? ?

## 2021-10-03 DIAGNOSIS — Z23 Encounter for immunization: Secondary | ICD-10-CM | POA: Diagnosis not present

## 2021-10-03 DIAGNOSIS — Z6828 Body mass index (BMI) 28.0-28.9, adult: Secondary | ICD-10-CM | POA: Diagnosis not present

## 2021-10-03 DIAGNOSIS — J849 Interstitial pulmonary disease, unspecified: Secondary | ICD-10-CM | POA: Diagnosis not present

## 2021-10-09 DIAGNOSIS — I2699 Other pulmonary embolism without acute cor pulmonale: Secondary | ICD-10-CM | POA: Diagnosis not present

## 2021-10-09 DIAGNOSIS — M47816 Spondylosis without myelopathy or radiculopathy, lumbar region: Secondary | ICD-10-CM | POA: Diagnosis not present

## 2021-10-18 DIAGNOSIS — Z6828 Body mass index (BMI) 28.0-28.9, adult: Secondary | ICD-10-CM | POA: Diagnosis not present

## 2021-10-18 DIAGNOSIS — J849 Interstitial pulmonary disease, unspecified: Secondary | ICD-10-CM | POA: Diagnosis not present

## 2021-10-18 DIAGNOSIS — J310 Chronic rhinitis: Secondary | ICD-10-CM | POA: Diagnosis not present

## 2021-10-19 DIAGNOSIS — L578 Other skin changes due to chronic exposure to nonionizing radiation: Secondary | ICD-10-CM | POA: Diagnosis not present

## 2021-10-19 DIAGNOSIS — L570 Actinic keratosis: Secondary | ICD-10-CM | POA: Diagnosis not present

## 2021-10-19 DIAGNOSIS — L821 Other seborrheic keratosis: Secondary | ICD-10-CM | POA: Diagnosis not present

## 2021-10-19 DIAGNOSIS — C44629 Squamous cell carcinoma of skin of left upper limb, including shoulder: Secondary | ICD-10-CM | POA: Diagnosis not present

## 2021-10-19 DIAGNOSIS — C44529 Squamous cell carcinoma of skin of other part of trunk: Secondary | ICD-10-CM | POA: Diagnosis not present

## 2021-10-26 DIAGNOSIS — H1033 Unspecified acute conjunctivitis, bilateral: Secondary | ICD-10-CM | POA: Diagnosis not present

## 2021-11-01 DIAGNOSIS — H1011 Acute atopic conjunctivitis, right eye: Secondary | ICD-10-CM | POA: Diagnosis not present

## 2021-11-01 DIAGNOSIS — H43393 Other vitreous opacities, bilateral: Secondary | ICD-10-CM | POA: Diagnosis not present

## 2021-11-01 DIAGNOSIS — H02402 Unspecified ptosis of left eyelid: Secondary | ICD-10-CM | POA: Diagnosis not present

## 2021-11-01 DIAGNOSIS — Z961 Presence of intraocular lens: Secondary | ICD-10-CM | POA: Diagnosis not present

## 2021-11-01 DIAGNOSIS — H26492 Other secondary cataract, left eye: Secondary | ICD-10-CM | POA: Diagnosis not present

## 2021-11-09 DIAGNOSIS — I2699 Other pulmonary embolism without acute cor pulmonale: Secondary | ICD-10-CM | POA: Diagnosis not present

## 2021-11-09 DIAGNOSIS — J849 Interstitial pulmonary disease, unspecified: Secondary | ICD-10-CM | POA: Diagnosis not present

## 2021-11-11 DIAGNOSIS — G4733 Obstructive sleep apnea (adult) (pediatric): Secondary | ICD-10-CM | POA: Diagnosis not present

## 2021-11-13 DIAGNOSIS — C44629 Squamous cell carcinoma of skin of left upper limb, including shoulder: Secondary | ICD-10-CM | POA: Diagnosis not present

## 2021-11-15 ENCOUNTER — Other Ambulatory Visit: Payer: Self-pay | Admitting: Cardiology

## 2021-11-16 NOTE — Telephone Encounter (Signed)
Rx refill sent to pharmacy. 

## 2021-12-04 ENCOUNTER — Encounter: Payer: Self-pay | Admitting: Pulmonary Disease

## 2021-12-04 ENCOUNTER — Ambulatory Visit: Payer: PPO | Admitting: Pulmonary Disease

## 2021-12-04 VITALS — BP 130/62 | HR 63 | Temp 98.2°F | Ht 70.0 in | Wt 190.0 lb

## 2021-12-04 DIAGNOSIS — R7303 Prediabetes: Secondary | ICD-10-CM | POA: Diagnosis not present

## 2021-12-04 DIAGNOSIS — J849 Interstitial pulmonary disease, unspecified: Secondary | ICD-10-CM

## 2021-12-04 DIAGNOSIS — J84112 Idiopathic pulmonary fibrosis: Secondary | ICD-10-CM

## 2021-12-04 DIAGNOSIS — E039 Hypothyroidism, unspecified: Secondary | ICD-10-CM | POA: Diagnosis not present

## 2021-12-04 DIAGNOSIS — E785 Hyperlipidemia, unspecified: Secondary | ICD-10-CM | POA: Diagnosis not present

## 2021-12-05 ENCOUNTER — Encounter: Payer: Self-pay | Admitting: Cardiology

## 2021-12-05 ENCOUNTER — Ambulatory Visit: Payer: PPO | Admitting: Cardiology

## 2021-12-05 VITALS — BP 120/80 | HR 58 | Ht 70.0 in | Wt 188.0 lb

## 2021-12-05 DIAGNOSIS — J849 Interstitial pulmonary disease, unspecified: Secondary | ICD-10-CM

## 2021-12-05 DIAGNOSIS — I2782 Chronic pulmonary embolism: Secondary | ICD-10-CM

## 2021-12-05 DIAGNOSIS — R0609 Other forms of dyspnea: Secondary | ICD-10-CM

## 2021-12-05 DIAGNOSIS — I2510 Atherosclerotic heart disease of native coronary artery without angina pectoris: Secondary | ICD-10-CM

## 2021-12-05 DIAGNOSIS — I255 Ischemic cardiomyopathy: Secondary | ICD-10-CM

## 2021-12-05 DIAGNOSIS — I480 Paroxysmal atrial fibrillation: Secondary | ICD-10-CM | POA: Diagnosis not present

## 2021-12-05 DIAGNOSIS — Z951 Presence of aortocoronary bypass graft: Secondary | ICD-10-CM | POA: Diagnosis not present

## 2021-12-05 NOTE — Progress Notes (Signed)
Cardiology Office Note:    Date:  12/05/2021   ID:  Shane Massey, DOB Feb 22, 1941, MRN [id number]  PCP:  Charlott Rakes, MD  Cardiologist:  Gypsy Balsam, MD    Referring MD: Charlott Rakes, MD   Chief Complaint  Patient presents with   Follow-up  Doing better but still some shortness of breath  History of Present Illness:    Shane Massey is a 81 y.o. male    with past medical history significant for coronary artery disease.  In March 2017 he required PTCA and stenting of the mid LAD he also have history of ischemic cardiomyopathy however ejection fraction determined in January 2020 was normal.  He been complaining of fatigue tiredness for long period time eventually we decided to pursue cardiac catheterization.  That showed 60 to 75% stenosis of left main, 80% stenosis of ramus intermedius, 50% stenosis first diagonal branch on 08/13/2019 he ended up having coronary bypass graft with LIMA to LAD, left radial to second obtuse marginal branch and RIMA to ramus intermedius at the same time he did have PFO closure.  Recovery was complicated by an episode of atrial fibrillation successfully managed with  He has been struggling for a while with shortness of breath he had difficulty finding out what the problem was extensive evaluation has been performed included echocardiogram as well as stress test which was negative eventually D-dimer has been performed which was high CT of the chest showed pulmonary emboli.  End up being admitted to the hospital put on anticoagulation and discharged home however after that he end up 1 more time in the emergency room with shortness of breath no new pulmonary emboli.  Comes today to my office for follow-up overall doing better.  Still exertional shortness of breath but no chest pain tightness squeezing pressure burning chest.  He is taking anticoagulation probably need to be taken anticoagulation indefinitely.  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  Contemplation is about starting antifibrotic therapy.  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   Ischemic cardiomyopathy 10/24/2016   Overview:  Ejection fraction 45% in spring 2018   Myocardial infarction [facility])    Near syncope    Nonsustained ventricular tachycardia (HCC) 07/17/2018   OSA (obstructive sleep apnea)    OSA on CPAP 11/14/2015   CPAP 13    Paroxysmal atrial fibrillation (HCC) 09/09/2019   Pulmonary embolism (HCC)    Restless leg syndrome 11/30/2016   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    Past Surgical History:  Procedure Laterality Date   CATARACT EXTRACTION  2012   COLONOSCOPY  2013   CORONARY ARTERY BYPASS GRAFT N/A 08/13/2019   Procedure: CORONARY ARTERY BYPASS GRAFTING (CABG) TIMES 3.;  Surgeon: Linden Dolin, MD;  Location: [facility] OR;  Service: Open Heart Surgery;  Laterality: N/A;   CORONARY STENT PLACEMENT     heart stent Left 09/2015   HERNIA REPAIR  11/1977   LEFT HEART CATH AND CORONARY ANGIOGRAPHY N/A 08/06/2019   Procedure: LEFT HEART CATH  AND CORONARY ANGIOGRAPHY;  Surgeon: Lennette Bihari, MD;  Location: [facility] INVASIVE CV LAB;  Service: Cardiovascular;  Laterality: N/A;   RADIAL ARTERY HARVEST Left 08/13/2019   Procedure: OPEN RADIAL ARTERY HARVEST;  Surgeon: Linden Dolin, MD;  Location: [facility] OR;  Service: Open Heart Surgery;  Laterality: Left;   REPAIR OF PATENT FORAMEN OVALE N/A 08/13/2019   Procedure: CLOSURE OF PATENT FORAMEN OVALE;  Surgeon: Linden Dolin, MD;  Location: [facility] OR;  Service: Open  Heart Surgery;  Laterality: N/A;   RTC repair  Right 11/03/2020   SURGERY FOR BOWEL ABSCESS  10/1959   TEE WITHOUT CARDIOVERSION N/A 08/13/2019   Procedure: TRANSESOPHAGEAL ECHOCARDIOGRAM (TEE);  Surgeon: Linden Dolin, MD;  Location[facility] OR;  Service: Open Heart Surgery;  Laterality: N/A;    Current Medications: Current Meds  Medication Sig   albuterol (VENTOLIN HFA) 108 (90 Base) MCG/ACT inhaler Inhale 2 puffs into the lungs every 6 (six) hours as needed for wheezing or shortness of breath.   ammonium lactate (AMLACTIN) 12 % cream Apply 1 application topically daily.   apixaban (ELIQUIS) 5 MG TABS tablet Take 1 tablet (5 mg total) by mouth 2 (two) times daily.   Calcium Carb-Cholecalciferol (CALCIUM 600+D3 PO) Take 1 tablet by mouth daily at 12 noon.   clopidogrel (PLAVIX) 75 MG tablet Take 1 tablet by mouth once daily   dexlansoprazole (DEXILANT) 60 MG capsule Take 60 mg by mouth daily before breakfast.   furosemide (LASIX) 40 MG tablet Take 1.5 tablets (60 mg total) by mouth daily. TAKE 1 & 1/2 (ONE & ONE-HALF) TABLETS BY MOUTH ONCE DAILY   isosorbide mononitrate (IMDUR) 30 MG 24 hr tablet Take one tablet twice daily.   levothyroxine (SYNTHROID, LEVOTHROID) 50 MCG tablet Take 50 mcg by mouth daily before breakfast.   Multiple Vitamin (MULTIVITAMIN WITH MINERALS) TABS tablet Take 1 tablet by mouth daily at 12 noon. Unknown strength per patient   nitroGLYCERIN (NITROSTAT) 0.4 MG SL tablet Place 1 tablet (0.4 mg total) under the tongue every 5 (five) minutes as needed for chest pain.   potassium chloride (KLOR-CON) 10 MEQ tablet Take 1 tablet by mouth once daily   pramipexole (MIRAPEX) 1.5 MG tablet Take 1.5 mg by mouth at bedtime.    rosuvastatin (CRESTOR) 20 MG tablet Take 1 tablet (20 mg total) by mouth daily.   TRELEGY ELLIPTA 100-62.5-25 MCG/ACT AEPB Inhale 1 puff into the lungs daily.     Allergies:   Patient has no known allergies.   Social History   Socioeconomic History    Marital status: Married    Spouse name: Not on file   Number of children: Not on file   Years of education: Not on file   Highest education level: Not on file  Occupational History   Not on file  Tobacco Use   Smoking status: Former    Packs/day: 0.50    Years: 4.00    Total pack years: 2.00    Types: Cigarettes    Quit date: 06/11/1958    Years since quitting: 63.5   Smokeless tobacco: Former    Types: Chew    Quit date: 12/01/1958  Vaping Use   Vaping Use: Never used  Substance and Sexual Activity   Alcohol use: No    Alcohol/week: 0.0 standard drinks of alcohol   Drug use: No   Sexual activity: Not on file  Other Topics Concern   Not on file  Social History Narrative   Not on  file   Social Determinants of Health   Financial Resource Strain: Not on file  Food Insecurity: Not on file  Transportation Needs: Not on file  Physical Activity: Not on file  Stress: Not on file  Social Connections: Not on file     Family History: The patient's family history includes Asthma in his brother and maternal grandmother; Heart attack in his brother; Heart disease in his brother; Stroke in his father. ROS:   Please see the history of present illness.    All 14 point review of systems negative except as described per history of present illness  EKGs/Labs/Other Studies Reviewed:      Recent Labs: 08/04/2021: BUN 15; Creatinine, Ser 0.87; Hemoglobin 11.6; NT-Pro BNP 479; Platelets 218; Potassium 4.4; Sodium 142  Recent Lipid Panel    Component Value Date/Time   CHOL 128 04/12/2020 0913   TRIG 111 04/12/2020 0913   HDL 48 04/12/2020 0913   CHOLHDL 2.7 04/12/2020 0913   CHOLHDL 2.5 08/13/2019 0603   VLDL 25 08/13/2019 0603   LDLCALC 60 04/12/2020 0913    Physical Exam:    VS:  BP 120/80 (BP Location: Right Arm, Patient Position: Sitting, Cuff Size: Normal)   Pulse (!) 58   Ht 5\' 10"  (1.778 m)   Wt 188 lb (85.3 kg)   SpO2 94%   BMI 26.98 kg/m     Wt Readings from Last  3 Encounters:  12/05/21 188 lb (85.3 kg)  12/04/21 190 lb (86.2 kg)  09/07/21 191 lb 12.8 oz (87 kg)     GEN:  Well nourished, well developed in no acute distress HEENT: Normal NECK: No JVD; No carotid bruits LYMPHATICS: No lymphadenopathy CARDIAC: RRR, no murmurs, no rubs, no gallops RESPIRATORY:  Clear to auscultation without rales, wheezing or rhonchi  ABDOMEN: Soft, non-tender, non-distended MUSCULOSKELETAL:  No edema; No deformity  SKIN: Warm and dry LOWER EXTREMITIES: no swelling NEUROLOGIC:  Alert and oriented x 3 PSYCHIATRIC:  Normal affect   ASSESSMENT:    1. Coronary artery disease involving native heart without angina pectoris, unspecified vessel or lesion type   2. Ischemic cardiomyopathy   3. ILD (interstitial lung disease) (HCC)   4. Other chronic pulmonary embolism without acute cor pulmonale (HCC)   5. Paroxysmal atrial fibrillation (HCC)   6. S/P CABG x 3    PLAN:    In order of problems listed above:  Coronary disease stable from that point review and appropriate medications which I will continue. History of ischemic cardiomyopathy we will repeat his echocardiogram to recheck left ventricle ejection fraction as well as look at the size of the right ventricle Idiopathic pulmonary fibrosis, that being follow-up excellently by pulmonary team. History of pulmonary emboli.  We will continue anticoagulation. Paroxysmal atrial fibrillation, anticoagulated.  Dyslipidemia I did review his K PN his LDL is 66 HDL 49 good cholesterol control continue present management    Medication Adjustments/Labs and Tests Ordered: Current medicines are reviewed at length with the patient today.  Concerns regarding medicines are outlined above.  No orders of the defined types were placed in this encounter.  Medication changes: No orders of the defined types were placed in this encounter.   Signed, Georgeanna Lea, MD, [facility] 12/05/2021 11:51 AM    [facility]  [facility]

## 2021-12-06 DIAGNOSIS — G4733 Obstructive sleep apnea (adult) (pediatric): Secondary | ICD-10-CM | POA: Diagnosis not present

## 2021-12-09 DIAGNOSIS — I2699 Other pulmonary embolism without acute cor pulmonale: Secondary | ICD-10-CM | POA: Diagnosis not present

## 2021-12-09 DIAGNOSIS — J849 Interstitial pulmonary disease, unspecified: Secondary | ICD-10-CM | POA: Diagnosis not present

## 2021-12-11 DIAGNOSIS — G4733 Obstructive sleep apnea (adult) (pediatric): Secondary | ICD-10-CM | POA: Diagnosis not present

## 2021-12-13 ENCOUNTER — Ambulatory Visit (INDEPENDENT_AMBULATORY_CARE_PROVIDER_SITE_OTHER): Payer: PPO

## 2021-12-13 DIAGNOSIS — R0609 Other forms of dyspnea: Secondary | ICD-10-CM | POA: Diagnosis not present

## 2021-12-13 DIAGNOSIS — I2510 Atherosclerotic heart disease of native coronary artery without angina pectoris: Secondary | ICD-10-CM | POA: Diagnosis not present

## 2021-12-13 DIAGNOSIS — I255 Ischemic cardiomyopathy: Secondary | ICD-10-CM | POA: Diagnosis not present

## 2021-12-13 LAB — ECHOCARDIOGRAM COMPLETE
Area-P 1/2: 2.11 cm2
S' Lateral: 3.9 cm

## 2021-12-14 ENCOUNTER — Telehealth: Payer: Self-pay

## 2021-12-14 NOTE — Telephone Encounter (Signed)
Results reviewed with pt as per Dr. Krasowski's note.  Pt verbalized understanding and had no additional questions. Routed to PCP  

## 2021-12-22 DIAGNOSIS — E039 Hypothyroidism, unspecified: Secondary | ICD-10-CM | POA: Diagnosis not present

## 2021-12-22 DIAGNOSIS — R7303 Prediabetes: Secondary | ICD-10-CM | POA: Diagnosis not present

## 2021-12-22 DIAGNOSIS — E785 Hyperlipidemia, unspecified: Secondary | ICD-10-CM | POA: Diagnosis not present

## 2021-12-22 DIAGNOSIS — Z6828 Body mass index (BMI) 28.0-28.9, adult: Secondary | ICD-10-CM | POA: Diagnosis not present

## 2021-12-29 DIAGNOSIS — N411 Chronic prostatitis: Secondary | ICD-10-CM | POA: Diagnosis not present

## 2021-12-29 DIAGNOSIS — G4733 Obstructive sleep apnea (adult) (pediatric): Secondary | ICD-10-CM | POA: Diagnosis not present

## 2021-12-29 DIAGNOSIS — R3914 Feeling of incomplete bladder emptying: Secondary | ICD-10-CM | POA: Diagnosis not present

## 2021-12-29 DIAGNOSIS — R3912 Poor urinary stream: Secondary | ICD-10-CM | POA: Diagnosis not present

## 2021-12-29 DIAGNOSIS — N401 Enlarged prostate with lower urinary tract symptoms: Secondary | ICD-10-CM | POA: Diagnosis not present

## 2022-01-01 NOTE — Progress Notes (Signed)
Application for Eliquis completed and awaiting doctor's signature

## 2022-01-04 ENCOUNTER — Telehealth: Payer: Self-pay

## 2022-01-04 NOTE — Telephone Encounter (Signed)
[place] application faxed on 38/88/28. Refaxed Insurance card today.

## 2022-01-09 ENCOUNTER — Telehealth: Payer: Self-pay

## 2022-01-09 DIAGNOSIS — J849 Interstitial pulmonary disease, unspecified: Secondary | ICD-10-CM | POA: Diagnosis not present

## 2022-01-09 DIAGNOSIS — I2699 Other pulmonary embolism without acute cor pulmonale: Secondary | ICD-10-CM | POA: Diagnosis not present

## 2022-01-09 NOTE — Telephone Encounter (Signed)
#  [id number], application denied, patient need to meet 3% OOP on his prescription expense. LM to return my call.

## 2022-01-09 NOTE — Telephone Encounter (Signed)
Pt's wife returning call. Requesting call back.

## 2022-01-10 NOTE — Telephone Encounter (Signed)
Pt's wife is calling back and requesting a return call.

## 2022-01-11 ENCOUNTER — Encounter: Payer: Self-pay | Admitting: Cardiology

## 2022-01-11 DIAGNOSIS — G4733 Obstructive sleep apnea (adult) (pediatric): Secondary | ICD-10-CM | POA: Diagnosis not present

## 2022-01-11 NOTE — Telephone Encounter (Signed)
I called [facility], no available VM and no answer

## 2022-01-11 NOTE — Telephone Encounter (Signed)
error 

## 2022-01-11 NOTE — Telephone Encounter (Signed)
Pt is returning call. Would like to be called on her home phone. She believes the mobile number is having issues. Plans to be home until 3 p.m. today.

## 2022-01-15 ENCOUNTER — Telehealth: Payer: Self-pay | Admitting: Cardiology

## 2022-01-15 NOTE — Telephone Encounter (Signed)
Patient's wife is calling back due to not receiving a callback from [name]. Advised patient she was calling regarding the application through [employer] being denied regarding him needing to meet prescription expenses. Shane Massey verbalized understanding and requested callback regarding further information regarding it, she is requesting a detailed message be left if she does not answer.

## 2022-01-15 NOTE — Telephone Encounter (Signed)
Message addressed.

## 2022-01-18 DIAGNOSIS — M25572 Pain in left ankle and joints of left foot: Secondary | ICD-10-CM | POA: Diagnosis not present

## 2022-01-18 DIAGNOSIS — Z6828 Body mass index (BMI) 28.0-28.9, adult: Secondary | ICD-10-CM | POA: Diagnosis not present

## 2022-01-18 DIAGNOSIS — M7732 Calcaneal spur, left foot: Secondary | ICD-10-CM | POA: Diagnosis not present

## 2022-01-19 DIAGNOSIS — H02403 Unspecified ptosis of bilateral eyelids: Secondary | ICD-10-CM | POA: Diagnosis not present

## 2022-01-30 ENCOUNTER — Ambulatory Visit: Payer: Self-pay | Admitting: Podiatrist

## 2022-02-02 DIAGNOSIS — E039 Hypothyroidism, unspecified: Secondary | ICD-10-CM | POA: Diagnosis not present

## 2022-02-04 ENCOUNTER — Other Ambulatory Visit: Payer: Self-pay | Admitting: Cardiology

## 2022-02-05 DIAGNOSIS — Z6828 Body mass index (BMI) 28.0-28.9, adult: Secondary | ICD-10-CM | POA: Diagnosis not present

## 2022-02-05 DIAGNOSIS — E039 Hypothyroidism, unspecified: Secondary | ICD-10-CM | POA: Diagnosis not present

## 2022-02-05 DIAGNOSIS — M25572 Pain in left ankle and joints of left foot: Secondary | ICD-10-CM | POA: Diagnosis not present

## 2022-02-05 NOTE — Telephone Encounter (Signed)
Rx refill sent to pharmacy. 

## 2022-02-07 ENCOUNTER — Other Ambulatory Visit: Payer: Self-pay | Admitting: Cardiology

## 2022-02-07 NOTE — Telephone Encounter (Signed)
Rx refill sent to pharmacy. 

## 2022-02-09 DIAGNOSIS — E039 Hypothyroidism, unspecified: Secondary | ICD-10-CM | POA: Diagnosis not present

## 2022-02-09 DIAGNOSIS — J849 Interstitial pulmonary disease, unspecified: Secondary | ICD-10-CM | POA: Diagnosis not present

## 2022-02-11 DIAGNOSIS — G4733 Obstructive sleep apnea (adult) (pediatric): Secondary | ICD-10-CM | POA: Diagnosis not present

## 2022-02-15 DIAGNOSIS — M25572 Pain in left ankle and joints of left foot: Secondary | ICD-10-CM | POA: Diagnosis not present

## 2022-02-15 DIAGNOSIS — M25775 Osteophyte, left foot: Secondary | ICD-10-CM | POA: Diagnosis not present

## 2022-02-15 DIAGNOSIS — M76822 Posterior tibial tendinitis, left leg: Secondary | ICD-10-CM | POA: Diagnosis not present

## 2022-02-15 DIAGNOSIS — M7752 Other enthesopathy of left foot: Secondary | ICD-10-CM | POA: Diagnosis not present

## 2022-02-16 DIAGNOSIS — R3914 Feeling of incomplete bladder emptying: Secondary | ICD-10-CM | POA: Diagnosis not present

## 2022-02-16 DIAGNOSIS — N401 Enlarged prostate with lower urinary tract symptoms: Secondary | ICD-10-CM | POA: Diagnosis not present

## 2022-02-16 DIAGNOSIS — N50812 Left testicular pain: Secondary | ICD-10-CM | POA: Diagnosis not present

## 2022-02-19 ENCOUNTER — Other Ambulatory Visit: Payer: Self-pay | Admitting: Cardiology

## 2022-02-20 DIAGNOSIS — L821 Other seborrheic keratosis: Secondary | ICD-10-CM | POA: Diagnosis not present

## 2022-02-20 DIAGNOSIS — L578 Other skin changes due to chronic exposure to nonionizing radiation: Secondary | ICD-10-CM | POA: Diagnosis not present

## 2022-02-20 DIAGNOSIS — L570 Actinic keratosis: Secondary | ICD-10-CM | POA: Diagnosis not present

## 2022-02-20 DIAGNOSIS — C44629 Squamous cell carcinoma of skin of left upper limb, including shoulder: Secondary | ICD-10-CM | POA: Diagnosis not present

## 2022-03-01 DIAGNOSIS — Z23 Encounter for immunization: Secondary | ICD-10-CM | POA: Diagnosis not present

## 2022-03-03 ENCOUNTER — Other Ambulatory Visit: Payer: Self-pay | Admitting: Cardiology

## 2022-03-11 DIAGNOSIS — J849 Interstitial pulmonary disease, unspecified: Secondary | ICD-10-CM | POA: Diagnosis not present

## 2022-03-11 DIAGNOSIS — E039 Hypothyroidism, unspecified: Secondary | ICD-10-CM | POA: Diagnosis not present

## 2022-03-13 DIAGNOSIS — G4733 Obstructive sleep apnea (adult) (pediatric): Secondary | ICD-10-CM | POA: Diagnosis not present

## 2022-03-15 DIAGNOSIS — G4733 Obstructive sleep apnea (adult) (pediatric): Secondary | ICD-10-CM | POA: Diagnosis not present

## 2022-03-16 DIAGNOSIS — M76822 Posterior tibial tendinitis, left leg: Secondary | ICD-10-CM | POA: Diagnosis not present

## 2022-03-22 ENCOUNTER — Other Ambulatory Visit: Payer: Self-pay

## 2022-03-22 ENCOUNTER — Telehealth: Payer: Self-pay | Admitting: Cardiology

## 2022-03-22 NOTE — Telephone Encounter (Signed)
Patient needs eliquis samples  Thank you

## 2022-03-22 NOTE — Telephone Encounter (Signed)
Called patient and informed him that his samples of Eliquis were ready for pick up. Patient was appreciative and said they would come by tomorrow to pick up the samples.

## 2022-03-22 NOTE — Telephone Encounter (Signed)
Please call [phone] or [phone] as they will be traveling around [place] today

## 2022-03-29 DIAGNOSIS — G4733 Obstructive sleep apnea (adult) (pediatric): Secondary | ICD-10-CM | POA: Diagnosis not present

## 2022-04-10 DIAGNOSIS — I25119 Atherosclerotic heart disease of native coronary artery with unspecified angina pectoris: Secondary | ICD-10-CM | POA: Diagnosis not present

## 2022-04-10 DIAGNOSIS — E039 Hypothyroidism, unspecified: Secondary | ICD-10-CM | POA: Diagnosis not present

## 2022-04-10 DIAGNOSIS — E663 Overweight: Secondary | ICD-10-CM | POA: Diagnosis not present

## 2022-04-10 DIAGNOSIS — D699 Hemorrhagic condition, unspecified: Secondary | ICD-10-CM | POA: Diagnosis not present

## 2022-04-10 DIAGNOSIS — G2581 Restless legs syndrome: Secondary | ICD-10-CM | POA: Diagnosis not present

## 2022-04-10 DIAGNOSIS — I5020 Unspecified systolic (congestive) heart failure: Secondary | ICD-10-CM | POA: Diagnosis not present

## 2022-04-10 DIAGNOSIS — E038 Other specified hypothyroidism: Secondary | ICD-10-CM | POA: Diagnosis not present

## 2022-04-10 DIAGNOSIS — I110 Hypertensive heart disease with heart failure: Secondary | ICD-10-CM | POA: Diagnosis not present

## 2022-04-10 DIAGNOSIS — E261 Secondary hyperaldosteronism: Secondary | ICD-10-CM | POA: Diagnosis not present

## 2022-04-10 DIAGNOSIS — E785 Hyperlipidemia, unspecified: Secondary | ICD-10-CM | POA: Diagnosis not present

## 2022-04-10 DIAGNOSIS — I4891 Unspecified atrial fibrillation: Secondary | ICD-10-CM | POA: Diagnosis not present

## 2022-04-10 DIAGNOSIS — G63 Polyneuropathy in diseases classified elsewhere: Secondary | ICD-10-CM | POA: Diagnosis not present

## 2022-04-11 DIAGNOSIS — J849 Interstitial pulmonary disease, unspecified: Secondary | ICD-10-CM | POA: Diagnosis not present

## 2022-04-11 DIAGNOSIS — E039 Hypothyroidism, unspecified: Secondary | ICD-10-CM | POA: Diagnosis not present

## 2022-04-18 DIAGNOSIS — I509 Heart failure, unspecified: Secondary | ICD-10-CM | POA: Diagnosis not present

## 2022-04-18 DIAGNOSIS — R110 Nausea: Secondary | ICD-10-CM | POA: Diagnosis not present

## 2022-04-18 DIAGNOSIS — E039 Hypothyroidism, unspecified: Secondary | ICD-10-CM | POA: Diagnosis not present

## 2022-04-18 DIAGNOSIS — E785 Hyperlipidemia, unspecified: Secondary | ICD-10-CM | POA: Diagnosis not present

## 2022-04-18 DIAGNOSIS — I2699 Other pulmonary embolism without acute cor pulmonale: Secondary | ICD-10-CM | POA: Diagnosis not present

## 2022-04-18 DIAGNOSIS — R001 Bradycardia, unspecified: Secondary | ICD-10-CM | POA: Diagnosis not present

## 2022-04-18 DIAGNOSIS — I10 Essential (primary) hypertension: Secondary | ICD-10-CM | POA: Diagnosis not present

## 2022-04-18 DIAGNOSIS — K219 Gastro-esophageal reflux disease without esophagitis: Secondary | ICD-10-CM | POA: Diagnosis not present

## 2022-04-18 DIAGNOSIS — I639 Cerebral infarction, unspecified: Secondary | ICD-10-CM | POA: Diagnosis not present

## 2022-04-18 DIAGNOSIS — I2510 Atherosclerotic heart disease of native coronary artery without angina pectoris: Secondary | ICD-10-CM | POA: Diagnosis not present

## 2022-04-18 DIAGNOSIS — I110 Hypertensive heart disease with heart failure: Secondary | ICD-10-CM | POA: Diagnosis not present

## 2022-04-18 DIAGNOSIS — Z20822 Contact with and (suspected) exposure to covid-19: Secondary | ICD-10-CM | POA: Diagnosis not present

## 2022-04-18 DIAGNOSIS — Z79899 Other long term (current) drug therapy: Secondary | ICD-10-CM | POA: Diagnosis not present

## 2022-04-18 DIAGNOSIS — R079 Chest pain, unspecified: Secondary | ICD-10-CM | POA: Diagnosis not present

## 2022-04-18 DIAGNOSIS — I249 Acute ischemic heart disease, unspecified: Secondary | ICD-10-CM | POA: Diagnosis not present

## 2022-04-18 DIAGNOSIS — Z86711 Personal history of pulmonary embolism: Secondary | ICD-10-CM | POA: Diagnosis not present

## 2022-04-19 DIAGNOSIS — I2510 Atherosclerotic heart disease of native coronary artery without angina pectoris: Secondary | ICD-10-CM | POA: Diagnosis not present

## 2022-04-19 DIAGNOSIS — E785 Hyperlipidemia, unspecified: Secondary | ICD-10-CM | POA: Diagnosis not present

## 2022-04-19 DIAGNOSIS — I249 Acute ischemic heart disease, unspecified: Secondary | ICD-10-CM | POA: Diagnosis not present

## 2022-04-19 DIAGNOSIS — R001 Bradycardia, unspecified: Secondary | ICD-10-CM | POA: Diagnosis not present

## 2022-04-19 DIAGNOSIS — R079 Chest pain, unspecified: Secondary | ICD-10-CM | POA: Diagnosis not present

## 2022-04-19 DIAGNOSIS — Z86711 Personal history of pulmonary embolism: Secondary | ICD-10-CM | POA: Diagnosis not present

## 2022-04-19 DIAGNOSIS — I517 Cardiomegaly: Secondary | ICD-10-CM | POA: Diagnosis not present

## 2022-04-19 DIAGNOSIS — I509 Heart failure, unspecified: Secondary | ICD-10-CM | POA: Diagnosis not present

## 2022-04-19 DIAGNOSIS — I10 Essential (primary) hypertension: Secondary | ICD-10-CM | POA: Diagnosis not present

## 2022-04-23 ENCOUNTER — Telehealth: Payer: Self-pay | Admitting: Cardiology

## 2022-04-23 NOTE — Telephone Encounter (Signed)
Needs eliquis samples  [phone]

## 2022-04-23 NOTE — Telephone Encounter (Signed)
Shane Massey will come and pick up pt assistance paperwork. Advised that we do not have any samples at this time.

## 2022-04-25 DIAGNOSIS — R079 Chest pain, unspecified: Secondary | ICD-10-CM | POA: Diagnosis not present

## 2022-04-25 DIAGNOSIS — Z6828 Body mass index (BMI) 28.0-28.9, adult: Secondary | ICD-10-CM | POA: Diagnosis not present

## 2022-04-27 ENCOUNTER — Encounter: Payer: Self-pay | Admitting: Cardiology

## 2022-04-27 ENCOUNTER — Telehealth: Payer: Self-pay | Admitting: Cardiology

## 2022-04-27 ENCOUNTER — Ambulatory Visit: Payer: PPO | Attending: Cardiology | Admitting: Cardiology

## 2022-04-27 ENCOUNTER — Ambulatory Visit: Payer: PPO | Attending: Cardiology

## 2022-04-27 VITALS — BP 130/78 | HR 55 | Ht 70.0 in | Wt 187.8 lb

## 2022-04-27 DIAGNOSIS — I480 Paroxysmal atrial fibrillation: Secondary | ICD-10-CM

## 2022-04-27 DIAGNOSIS — Z951 Presence of aortocoronary bypass graft: Secondary | ICD-10-CM

## 2022-04-27 DIAGNOSIS — J849 Interstitial pulmonary disease, unspecified: Secondary | ICD-10-CM

## 2022-04-27 DIAGNOSIS — I10 Essential (primary) hypertension: Secondary | ICD-10-CM

## 2022-04-27 DIAGNOSIS — I255 Ischemic cardiomyopathy: Secondary | ICD-10-CM | POA: Diagnosis not present

## 2022-04-27 DIAGNOSIS — I25709 Atherosclerosis of coronary artery bypass graft(s), unspecified, with unspecified angina pectoris: Secondary | ICD-10-CM

## 2022-04-27 MED ORDER — ISOSORBIDE MONONITRATE ER 60 MG PO TB24: 60.0000 mg | ORAL_TABLET | Freq: Every day | ORAL | 3 refills | Status: DC

## 2022-04-27 MED ORDER — APIXABAN 5 MG PO TABS: 5.0000 mg | ORAL_TABLET | Freq: Two times a day (BID) | ORAL | 3 refills | Status: DC

## 2022-04-27 NOTE — Addendum Note (Signed)
Addended by: Jacobo Forest D on: 04/27/2022 08:50 AM   Modules accepted: Orders

## 2022-04-27 NOTE — Patient Instructions (Signed)
Medication Instructions:  Your physician has recommended you make the following change in your medication:   INCREASE: Imdur to '60mg'$  daily - You may double your current dose and your next refill will reflect your new dose.   Lab Work: None Ordered If you have labs (blood work) drawn today and your tests are completely normal, you will receive your results only by: [place] (if you have MyChart) OR A paper copy in the mail If you have any lab test that is abnormal or we need to change your treatment, we will call you to review the results.   Testing/Procedures:  WHY IS MY DOCTOR PRESCRIBING ZIO? The Zio system is proven and trusted by physicians to detect and diagnose irregular heart rhythms -- and has been prescribed to hundreds of thousands of patients.  The FDA has cleared the Zio system to monitor for many different kinds of irregular heart rhythms. In a study, physicians were able to reach a diagnosis 90% of the time with the Zio system1.  You can wear the Zio monitor -- a small, discreet, comfortable patch -- during your normal day-to-day activity, including while you sleep, shower, and exercise, while it records every single heartbeat for analysis.  1Barrett, P., et al. Comparison of 24 Hour Holter Monitoring Versus 14 Day Novel Adhesive Patch Electrocardiographic Monitoring. [place], 2014.  ZIO VS. HOLTER MONITORING The Zio monitor can be comfortably worn for up to 14 days. Holter monitors can be worn for 24 to 48 hours, limiting the time to record any irregular heart rhythms you may have. Zio is able to capture data for the 51% of patients who have their first symptom-triggered arrhythmia after 48 hours.1  LIVE WITHOUT RESTRICTIONS The Zio ambulatory cardiac monitor is a small, unobtrusive, and water-resistant patch--you might even forget you're wearing it. The Zio monitor records and stores every beat of your heart, whether you're sleeping, working  out, or showering.     Follow-Up: At [facility], you and your health needs are our priority.  As part of our continuing mission to provide you with exceptional heart care, we have created designated Provider Care Teams.  These Care Teams include your primary Cardiologist (physician) and Advanced Practice Providers (APPs -  Physician Assistants and Nurse Practitioners) who all work together to provide you with the care you need, when you need it.  We recommend signing up for the patient portal called "MyChart".  Sign up information is provided on this After Visit Summary.  MyChart is used to connect with patients for Virtual Visits (Telemedicine).  Patients are able to view lab/test results, encounter notes, upcoming appointments, etc.  Non-urgent messages can be sent to your provider as well.   To learn more about what you can do with MyChart, go to [email or web address].    Your next appointment:   3 month(s)  The format for your next appointment:   In Person  Provider:   Jenne Campus, MD    Other Instructions NA

## 2022-04-27 NOTE — Telephone Encounter (Signed)
Pt c/o medication issue:  1. Name of Medication:   isosorbide mononitrate (IMDUR) 60 MG 24 hr tablet   2. How are you currently taking this medication (dosage and times per day)? As prescribed  3. Are you having a reaction (difficulty breathing--STAT)? No  4. What is your medication issue?   Wife stated patient's dosage for this medication was recently changed and she would like clarification on taking the new dosage.

## 2022-04-27 NOTE — Progress Notes (Signed)
Cardiology Office Note:    Date:  04/27/2022   ID:  Shane Massey, DOB 18-Nov-1940, MRN [id number]  PCP:  Maryella Shivers, MD  Cardiologist:  Jenne Campus, MD    Referring MD: Maryella Shivers, MD   Chief Complaint  Patient presents with   [facility] follow up    History of Present Illness:    Shane Massey is a 81 y.o. male   with past medical history significant for coronary artery disease.  In March 2017 he required PTCA and stenting of the mid LAD he also have history of ischemic cardiomyopathy however ejection fraction determined in January 2020 was normal.  He been complaining of fatigue tiredness for long period time eventually we decided to pursue cardiac catheterization.  That showed 60 to 75% stenosis of left main, 80% stenosis of ramus intermedius, 50% stenosis first diagonal branch on 08/13/2019 he ended up having coronary bypass graft with LIMA to LAD, left radial to second obtuse marginal branch and RIMA to ramus intermedius at the same time he did have PFO closure.  Recovery was complicated by an episode of atrial fibrillation successfully managed with  He has been struggling for a while with shortness of breath he had difficulty finding out what the problem was extensive evaluation has been performed included echocardiogram as well as stress test which was negative eventually D-dimer has been performed which was high CT of the chest showed pulmonary emboli.  End up being admitted to the hospital put on anticoagulation and discharged home however after that he end up 1 more time in the emergency room with shortness of breath no new pulmonary emboli.  He comes today to my office for follow-up.  Again he end up being in the emergency room what prompted his visit was sharp sensation that he have going from the middle of the chest and left arm.  He ruled out for myocardial infarction, stress test has been performed which showed no evidence of ischemia he was discharged home.  Since that  time he is doing fine he described one more situation where he felt uneasy sensation in the chest he said he was kind of nervous more he may have had episode of atrial fibrillation  Past Medical History:  Diagnosis Date   Angina pectoris ([place]) 09/06/2015   CAD (coronary artery disease) 08/12/2019   CHF (congestive heart failure) ([place])    Coronary artery disease    Dizziness 06/14/2017   Dyslipidemia 09/06/2015   Dyspnea    Dyspnea on exertion 09/06/2015   Dysrhythmia    Exertional dyspnea 09/06/2015   GERD (gastroesophageal reflux disease)    Hypertension    Hypothyroidism    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   Ischemic cardiomyopathy 10/24/2016   Overview:  Ejection fraction 45% in spring 2018   Myocardial infarction [facility])    Near syncope    Nonsustained ventricular tachycardia ([place]) 07/17/2018   OSA (obstructive sleep apnea)    OSA on CPAP 11/14/2015   CPAP 13    Paroxysmal atrial fibrillation (HCC) 09/09/2019   Pulmonary embolism ([place])    Restless leg syndrome 11/30/2016   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    Past Surgical History:  Procedure Laterality Date   CATARACT EXTRACTION  2012   COLONOSCOPY  2013   CORONARY ARTERY BYPASS GRAFT N/A 08/13/2019   Procedure: CORONARY ARTERY BYPASS GRAFTING (CABG) TIMES 3.;  Surgeon: Wonda Olds, MD;  Location: [place];  Service: Open Heart Surgery;  Laterality: N/A;  CORONARY STENT PLACEMENT     heart stent Left 09/2015   HERNIA REPAIR  11/1977   LEFT HEART CATH AND CORONARY ANGIOGRAPHY N/A 08/06/2019   Procedure: LEFT HEART CATH AND CORONARY ANGIOGRAPHY;  Surgeon: Troy Sine, MD;  Location: [place] CV LAB;  Service: Cardiovascular;  Laterality: N/A;   RADIAL ARTERY HARVEST Left 08/13/2019   Procedure: OPEN RADIAL ARTERY HARVEST;  Surgeon: Wonda Olds, MD;  Location: [place];  Service: Open Heart Surgery;  Laterality: Left;   REPAIR OF PATENT FORAMEN OVALE N/A 08/13/2019   Procedure: CLOSURE OF PATENT FORAMEN  OVALE;  Surgeon: Wonda Olds, MD;  Location: [place];  Service: Open Heart Surgery;  Laterality: N/A;   RTC repair  Right 11/03/2020   SURGERY FOR BOWEL ABSCESS  10/1959   TEE WITHOUT CARDIOVERSION N/A 08/13/2019   Procedure: TRANSESOPHAGEAL ECHOCARDIOGRAM (TEE);  Surgeon: Wonda Olds, MD;  Location: [place];  Service: Open Heart Surgery;  Laterality: N/A;    Current Medications: Current Meds  Medication Sig   apixaban (ELIQUIS) 5 MG TABS tablet Take 5 mg by mouth 2 (two) times daily.   Calcium Carb-Cholecalciferol (CALCIUM 600+D3 PO) Take 1 tablet by mouth daily at 12 noon.   clopidogrel (PLAVIX) 75 MG tablet Take 1 tablet (75 mg total) by mouth daily.   dexlansoprazole (DEXILANT) 60 MG capsule Take 60 mg by mouth daily before breakfast.   furosemide (LASIX) 40 MG tablet TAKE 1 & 1/2 (ONE & ONE-HALF) TABLETS BY MOUTH ONCE DAILY (Patient taking differently: Take 20-40 mg by mouth daily.)   isosorbide mononitrate (IMDUR) 30 MG 24 hr tablet Take 1 tablet by mouth twice daily (Patient taking differently: Take 30 mg by mouth 2 (two) times daily. Take 1 tablet by mouth twice daily)   levothyroxine (SYNTHROID) 75 MCG tablet Take 75 mcg by mouth daily before breakfast.   Multiple Vitamin (MULTIVITAMIN WITH MINERALS) TABS tablet Take 1 tablet by mouth daily at 12 noon. Unknown strength per patient   nitroGLYCERIN (NITROSTAT) 0.4 MG SL tablet Place 1 tablet (0.4 mg total) under the tongue every 5 (five) minutes as needed for chest pain.   potassium chloride (KLOR-CON) 10 MEQ tablet Take 1 tablet by mouth once daily (Patient taking differently: Take 10 mEq by mouth daily.)   pramipexole (MIRAPEX) 1.5 MG tablet Take 1.5 mg by mouth at bedtime.    rosuvastatin (CRESTOR) 20 MG tablet Take 1 tablet by mouth once daily   TRELEGY ELLIPTA 100-62.5-25 MCG/ACT AEPB Inhale 1 puff into the lungs daily.   [DISCONTINUED] albuterol (VENTOLIN HFA) 108 (90 Base) MCG/ACT inhaler Inhale 2 puffs into the lungs  every 6 (six) hours as needed for wheezing or shortness of breath.   [DISCONTINUED] ammonium lactate (AMLACTIN) 12 % cream Apply 1 application topically daily.   [DISCONTINUED] apixaban (ELIQUIS) 5 MG TABS tablet Take 1 tablet (5 mg total) by mouth 2 (two) times daily.     Allergies:   Patient has no known allergies.   Social History   Socioeconomic History   Marital status: Married    Spouse name: Not on file   Number of children: Not on file   Years of education: Not on file   Highest education level: Not on file  Occupational History   Not on file  Tobacco Use   Smoking status: Former    Packs/day: 0.50    Years: 4.00    Total pack years: 2.00    Types: Cigarettes    Quit  date: 06/11/1958    Years since quitting: 63.9   Smokeless tobacco: Former    Types: Chew    Quit date: 12/01/1958  Vaping Use   Vaping Use: Never used  Substance and Sexual Activity   Alcohol use: No    Alcohol/week: 0.0 standard drinks of alcohol   Drug use: No   Sexual activity: Not on file  Other Topics Concern   Not on file  Social History Narrative   Not on file   Social Determinants of Health   Financial Resource Strain: Not on file  Food Insecurity: Not on file  Transportation Needs: Not on file  Physical Activity: Not on file  Stress: Not on file  Social Connections: Not on file     Family History: The patient's family history includes Asthma in his brother and maternal grandmother; Heart attack in his brother; Heart disease in his brother; Stroke in his father. ROS:   Please see the history of present illness.    All 14 point review of systems negative except as described per history of present illness  EKGs/Labs/Other Studies Reviewed:      Recent Labs: 08/04/2021: BUN 15; Creatinine, Ser 0.87; Hemoglobin 11.6; NT-Pro BNP 479; Platelets 218; Potassium 4.4; Sodium 142  Recent Lipid Panel    Component Value Date/Time   CHOL 128 04/12/2020 0913   TRIG 111 04/12/2020 0913    HDL 48 04/12/2020 0913   CHOLHDL 2.7 04/12/2020 0913   CHOLHDL 2.5 08/13/2019 0603   VLDL 25 08/13/2019 0603   LDLCALC 60 04/12/2020 0913    Physical Exam:    VS:  BP 130/78 (BP Location: Left Arm, Patient Position: Sitting)   Pulse (!) 55   Ht '5\' 10"'$  (1.778 m)   Wt 187 lb 12.8 oz (85.2 kg)   SpO2 99%   BMI 26.95 kg/m     Wt Readings from Last 3 Encounters:  04/27/22 187 lb 12.8 oz (85.2 kg)  12/05/21 188 lb (85.3 kg)  12/04/21 190 lb (86.2 kg)     GEN:  Well nourished, well developed in no acute distress HEENT: Normal NECK: No JVD; No carotid bruits LYMPHATICS: No lymphadenopathy CARDIAC: RRR, no murmurs, no rubs, no gallops RESPIRATORY:  Clear to auscultation without rales, wheezing or rhonchi  ABDOMEN: Soft, non-tender, non-distended MUSCULOSKELETAL:  No edema; No deformity  SKIN: Warm and dry LOWER EXTREMITIES: no swelling NEUROLOGIC:  Alert and oriented x 3 PSYCHIATRIC:  Normal affect   ASSESSMENT:    1. Coronary artery disease involving coronary bypass graft of native heart with angina pectoris ([place])   2. ILD (interstitial lung disease) (HCC)   3. Paroxysmal atrial fibrillation ([place])   4. Ischemic cardiomyopathy   5. Essential hypertension   6. S/P CABG x 3    PLAN:    In order of problems listed above:  Coronary disease.  He does have some symptoms which are atypical, stress test negative I still think there is some value of increasing antianginal therapy, therefore, I will double the dose of Imdur from 30 to 60 mg daily. Interstitial lung disease that being followed by internal medicine team. Paroxysmal of atrial fibrillation I am worried that this uneasy sensation with shakiness in the chest he describes was related to atrial fibrillation.  I will ask him to wear Zio patch to see if he got any episode of atrial fibrillation, likely he is already anticoagulated Ischemic heart myopathy, last echocardiogram showed ejection fraction below normal.  I will  continue present management.  History of CABG.  Noted. I did review record from hospital for this visit   Medication Adjustments/Labs and Tests Ordered: Current medicines are reviewed at length with the patient today.  Concerns regarding medicines are outlined above.  No orders of the defined types were placed in this encounter.  Medication changes: No orders of the defined types were placed in this encounter.   Signed, Park Liter, MD, [facility] 04/27/2022 8:28 AM    [place]

## 2022-04-28 DIAGNOSIS — J069 Acute upper respiratory infection, unspecified: Secondary | ICD-10-CM | POA: Diagnosis not present

## 2022-04-28 DIAGNOSIS — R509 Fever, unspecified: Secondary | ICD-10-CM | POA: Diagnosis not present

## 2022-04-30 ENCOUNTER — Telehealth: Payer: Self-pay | Admitting: Cardiology

## 2022-04-30 NOTE — Telephone Encounter (Signed)
Pt c/o medication issue:  1. Name of Medication: Eliquis '5mg'$   2. How are you currently taking this medication (dosage and times per day)?   Take 1 tablet (5 mg total) by mouth 2 (two) times daily.    3. Are you having a reaction (difficulty breathing--STAT)? no  4. What is your medication issue? Patient's wife wants to know if he can take the generic apixaban 5 MG.

## 2022-04-30 NOTE — Telephone Encounter (Signed)
Spoke with spouse. She wanted to know if pt could take the Apixaban generic for Eliquis. Advised this was ok to use. She had no further questions.

## 2022-05-06 IMAGING — CT CT CHEST HIGH RESOLUTION
1 of 4 series · 14 of 32 positions shown, 18 images · non-contrast
Comparison: Chest CTA 09/01/2020.

CLINICAL DATA: 80-year-old male with history of shortness of breath
and difficulty breathing for the past 2 years. Evaluate for
interstitial lung disease.

EXAM:
CT CHEST WITHOUT CONTRAST
TECHNIQUE: Multidetector CT imaging of the chest was performed following the
standard protocol without intravenous contrast. High resolution
imaging of the lungs, as well as inspiratory and expiratory imaging,
was performed.

[Series 2: chest · axial · 0.67mm/px · z∈[-321,-47]mm · 14 of 155 slices shown, 18 images]
[im 9/155  mediastinal]
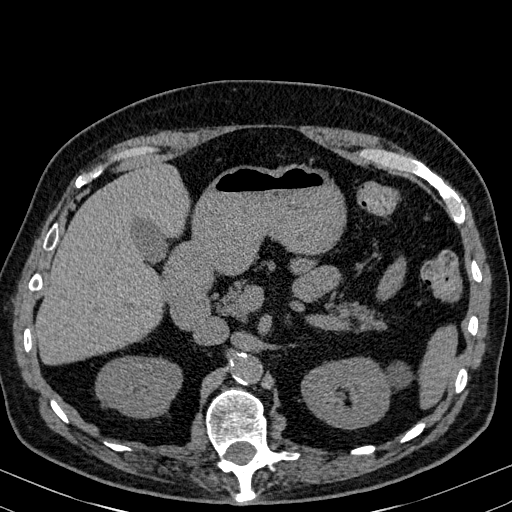
[im 9/155  lung]
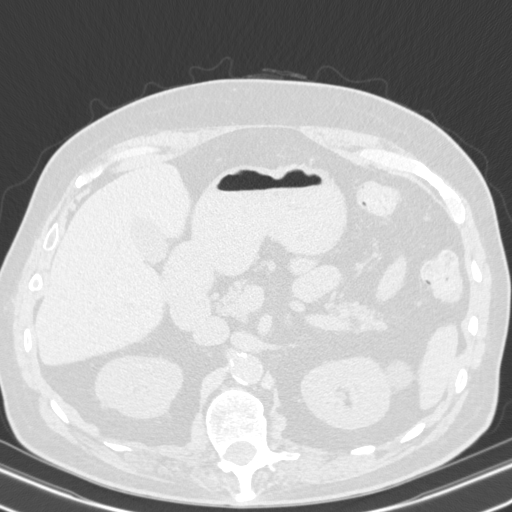
[im 18/155  lung]
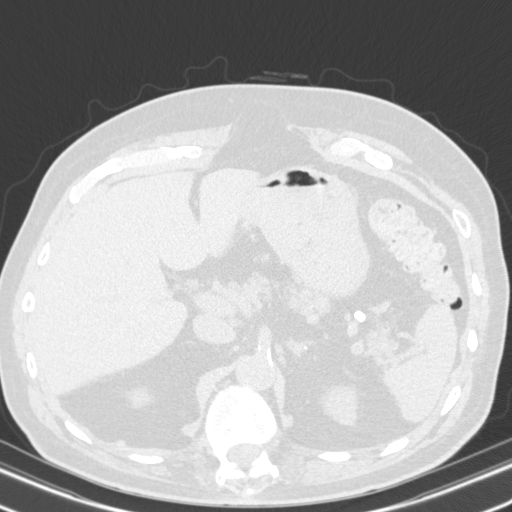
[im 35/155  lung]
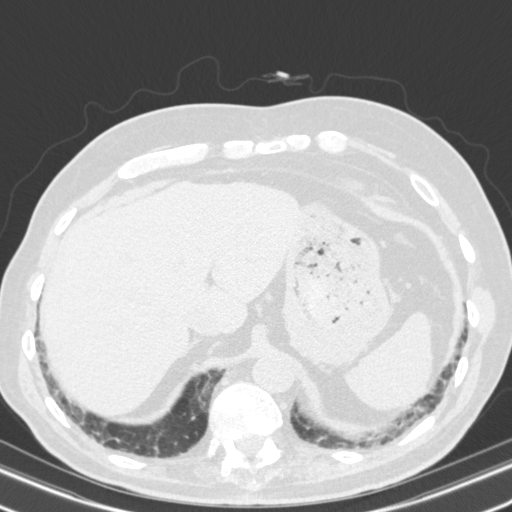
[im 43/155  lung]
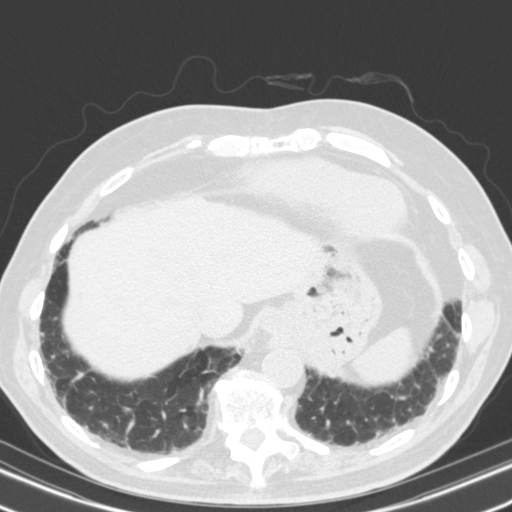
[im 52/155  mediastinal]
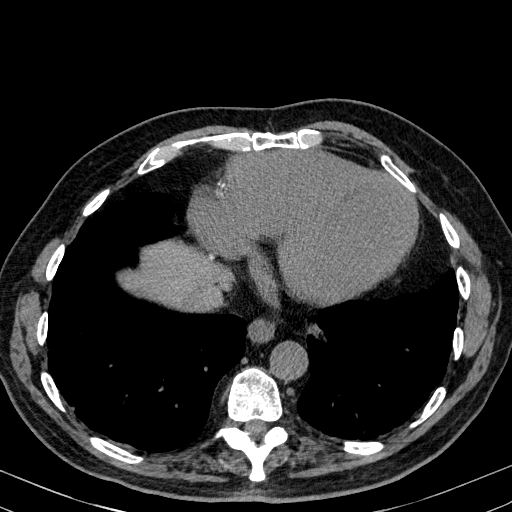
[im 52/155  lung]
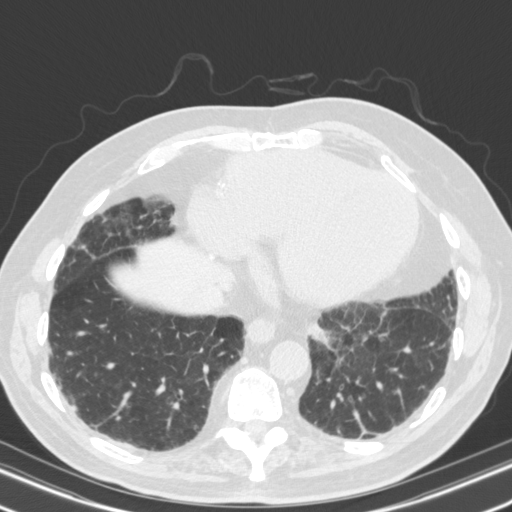
[im 69/155  lung]
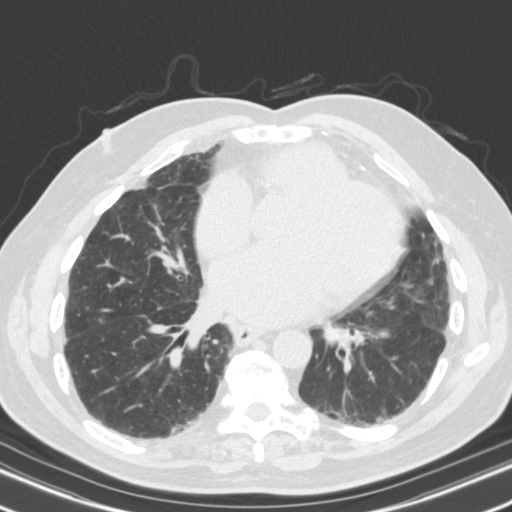
[im 74/155  lung]
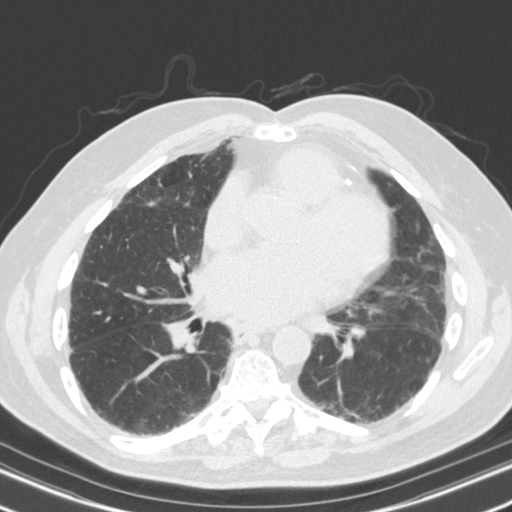
[im 78/155  lung]
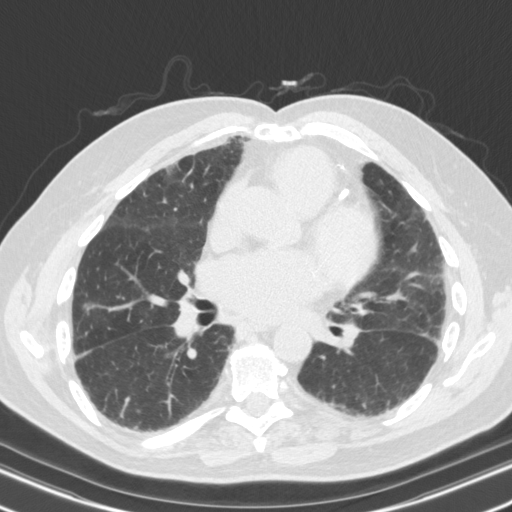
[im 86/155  mediastinal]
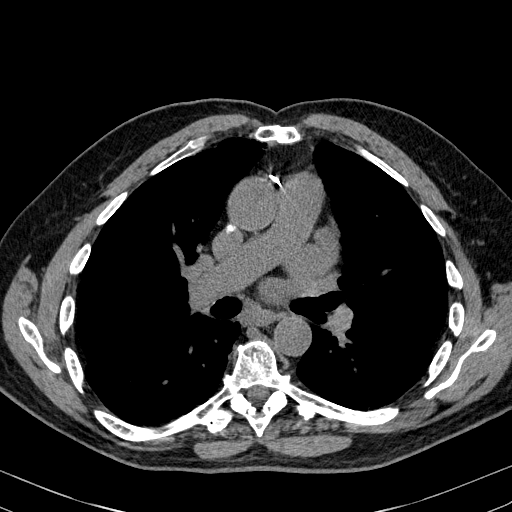
[im 86/155  lung]
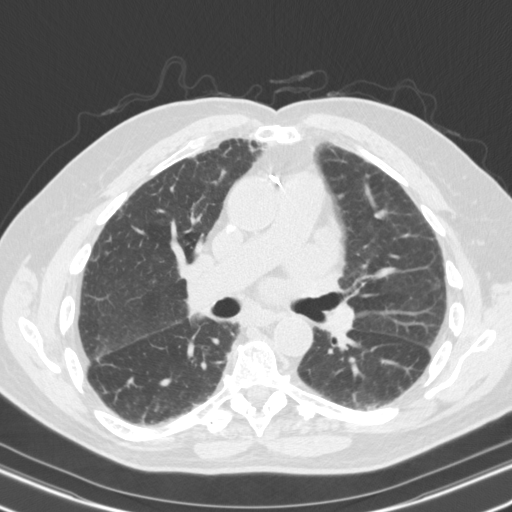
[im 103/155  lung]
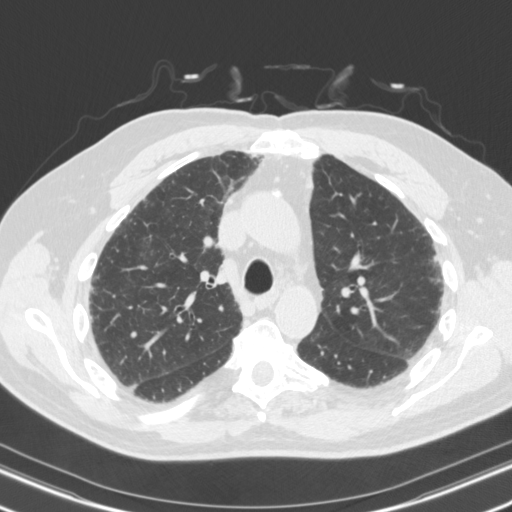
[im 112/155  lung]
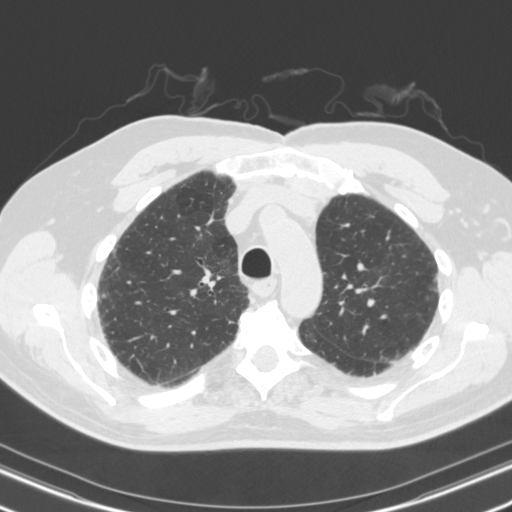
[im 120/155  lung]
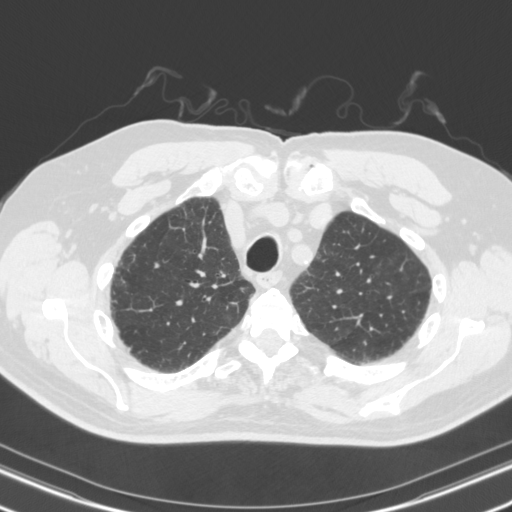
[im 137/155  mediastinal]
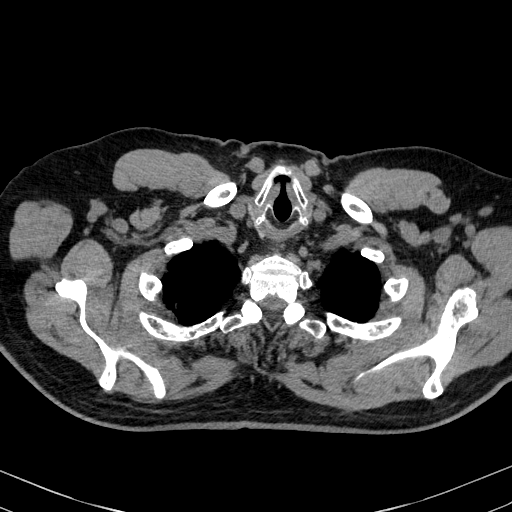
[im 137/155  lung]
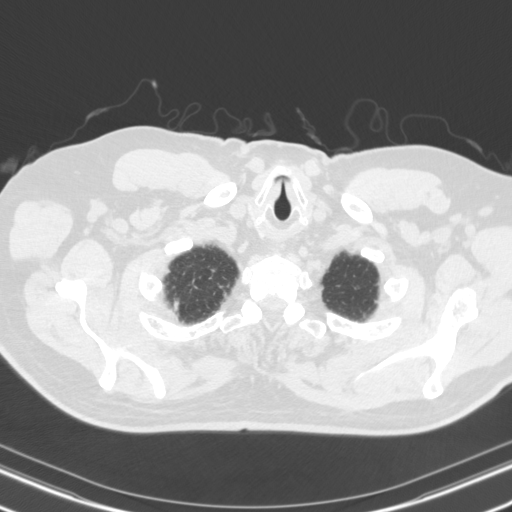
[im 146/155  lung]
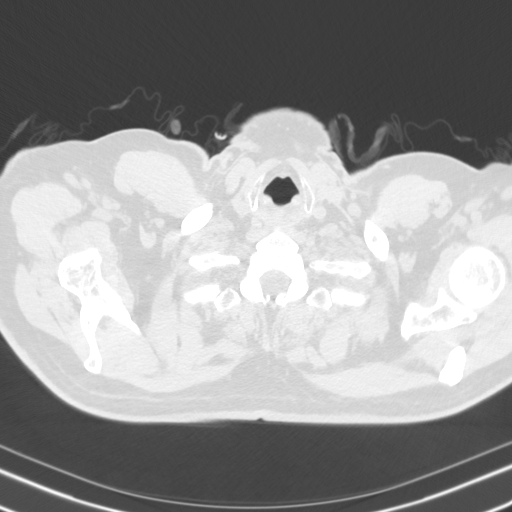

[14 of 32 positions shown; findings below may reference images not displayed]

FINDINGS: Cardiovascular: Heart size is mildly enlarged. There is no
significant pericardial fluid, thickening or pericardial
calcification. There is aortic atherosclerosis, as well as
atherosclerosis of the great vessels of the mediastinum and the
coronary arteries, including calcified atherosclerotic plaque in the
left main, left anterior descending and right coronary arteries.
Status post median sternotomy for CABG including [REDACTED] to the LAD.

Mediastinum/Nodes: Mediastinal or no pathologically enlarged hilar
lymph nodes. Please note that accurate exclusion of hilar adenopathy
is limited on noncontrast CT scans. Esophagus is unremarkable in
appearance. No axillary lymphadenopathy.

Lungs/Pleura: High-resolution images demonstrates some widespread
but patchy areas of mild ground-glass attenuation and septal
thickening noted in the lungs bilaterally. No traction
bronchiectasis or honeycombing. Findings have no discernible
craniocaudal gradient and do not appear progressive compared to the
prior examination. Inspiratory and expiratory imaging demonstrates
moderate air trapping indicative of small airways disease.

Upper Abdomen: Aortic atherosclerosis. 2.6 cm low-attenuation lesion
in the lateral aspect of the upper pole of the left kidney,
incompletely characterized on today's non-contrast CT examination,
but statistically likely to represent a cyst.

Musculoskeletal: There are no aggressive appearing lytic or blastic
lesions noted in the visualized portions of the skeleton.
IMPRESSION: 1. The appearance of the lungs is compatible with interstitial lung
disease, with a spectrum of findings considered most indicative of
an alternative diagnosis (not usual interstitial pneumonia) per
current ATS guidelines. Findings are favored to reflect mild
nonspecific interstitial pneumonia (NSIP). Repeat high-resolution
chest CT is recommended in 12 months to assess for temporal changes
in the appearance of the lung parenchyma.
2. Aortic atherosclerosis, in addition to left main and 2 vessel
coronary artery disease. Status post median sternotomy for CABG
including [REDACTED] to the LAD.
3. Mild cardiomegaly.

Aortic Atherosclerosis (VYGK6-B89.9).

## 2022-05-07 ENCOUNTER — Ambulatory Visit: Payer: PPO | Admitting: Cardiology

## 2022-05-08 ENCOUNTER — Telehealth: Payer: Self-pay

## 2022-05-08 NOTE — Telephone Encounter (Signed)
Renewal forms for Eliquis and samples available for pick up. Shane Massey is aware we ran out of 5 mg samples  so we provided 2.5 mg to take 2 tablets twice daily. She verbalized understanding.

## 2022-05-10 ENCOUNTER — Telehealth: Payer: Self-pay

## 2022-05-10 NOTE — Telephone Encounter (Signed)
LM to return my call to confirm he has BMS renewal forms and samples.

## 2022-05-11 DIAGNOSIS — J849 Interstitial pulmonary disease, unspecified: Secondary | ICD-10-CM | POA: Diagnosis not present

## 2022-05-11 DIAGNOSIS — R918 Other nonspecific abnormal finding of lung field: Secondary | ICD-10-CM | POA: Diagnosis not present

## 2022-05-11 DIAGNOSIS — K449 Diaphragmatic hernia without obstruction or gangrene: Secondary | ICD-10-CM | POA: Diagnosis not present

## 2022-05-11 DIAGNOSIS — I700 Atherosclerosis of aorta: Secondary | ICD-10-CM | POA: Diagnosis not present

## 2022-05-11 DIAGNOSIS — E039 Hypothyroidism, unspecified: Secondary | ICD-10-CM | POA: Diagnosis not present

## 2022-05-11 DIAGNOSIS — J84112 Idiopathic pulmonary fibrosis: Secondary | ICD-10-CM | POA: Diagnosis not present

## 2022-05-16 ENCOUNTER — Ambulatory Visit (INDEPENDENT_AMBULATORY_CARE_PROVIDER_SITE_OTHER): Payer: PPO | Admitting: Pulmonary Disease

## 2022-05-16 ENCOUNTER — Encounter: Payer: Self-pay | Admitting: Pulmonary Disease

## 2022-05-16 VITALS — BP 132/60 | HR 53 | Temp 97.9°F | Ht 70.0 in | Wt 185.0 lb

## 2022-05-16 DIAGNOSIS — J849 Interstitial pulmonary disease, unspecified: Secondary | ICD-10-CM

## 2022-05-16 MED ORDER — TRELEGY ELLIPTA 100-62.5-25 MCG/ACT IN AEPB: 1.0000 | INHALATION_SPRAY | Freq: Every day | RESPIRATORY_TRACT | 11 refills | Status: AC

## 2022-05-16 NOTE — Progress Notes (Signed)
Shane Massey    [id number]    Apr 11, 1941  Primary Care Physician:Hodges, Beatriz Chancellor, MD  Referring Physician: Maryella Shivers, MD [name],  [place] [id number]  Chief complaint: Follow-up for ILD  HPI: 81 y.o.  with history of cardiomyopathy, OSA, GERD Here for evaluation of interstitial lung disease Complains of chronic dyspnea on exertion.  Has to stop after walking about 200 feet.  No symptoms at rest Denies any cough, sputum production, chest congestion or wheezing He had a high-res CT showing mild interstitial changes which are being monitored.  Previously tried on ICS/LABA inhaler with no improvement in symptoms  History also significant for ischemic cardiomyopathy for which she follows with Dr. Agustin Cree.  Recent echocardiogram showed EF of 40-45% and losartan added instead of lisinopril OSA for which he is on BiPAP, managed by Dr. Maxwell Caul at [facility] He has GERD treated with Dexilant Has remained active and likes to deer hunt as a hobby  I have reviewed the notes from cardiology, prior pulmonary and sleep clinic notes  He was hospitalized in January 2023 at [facility] for pulmonary embolism and started on Eliquis anticoagulation  Pets: No pets Occupation: Worked as a Therapist, occupational in [employer]  Reports exposure to asbestos while working in the [place] Exposures: Asbestos exposure as above.  No mold, hot tub, Jacuzzi.  No feather pillows or comforter Smoking history: Minimal smoking as a teenager Travel history: No significant travel Relevant family history: No family history of lung disease  Interim history: Stopped using Trelegy 1 month ago and states that his breathing is worse with increased dyspnea on exertion  He is here for review of CT scan.  Outpatient Encounter Medications as of 05/16/2022  Medication Sig   apixaban (ELIQUIS) 5 MG TABS tablet Take 1 tablet (5 mg total) by mouth 2 (two) times daily.   Calcium  Carb-Cholecalciferol (CALCIUM 600+D3 PO) Take 1 tablet by mouth daily at 12 noon.   clopidogrel (PLAVIX) 75 MG tablet Take 1 tablet (75 mg total) by mouth daily.   dexlansoprazole (DEXILANT) 60 MG capsule Take 60 mg by mouth daily before breakfast.   furosemide (LASIX) 40 MG tablet TAKE 1 & 1/2 (ONE & ONE-HALF) TABLETS BY MOUTH ONCE DAILY (Patient taking differently: Take 20-40 mg by mouth daily.)   isosorbide mononitrate (IMDUR) 60 MG 24 hr tablet Take 1 tablet (60 mg total) by mouth daily.   levothyroxine (SYNTHROID) 75 MCG tablet Take 75 mcg by mouth daily before breakfast.   Multiple Vitamin (MULTIVITAMIN WITH MINERALS) TABS tablet Take 1 tablet by mouth daily at 12 noon. Unknown strength per patient   nitroGLYCERIN (NITROSTAT) 0.4 MG SL tablet Place 1 tablet (0.4 mg total) under the tongue every 5 (five) minutes as needed for chest pain.   potassium chloride (KLOR-CON) 10 MEQ tablet Take 1 tablet by mouth once daily (Patient taking differently: Take 10 mEq by mouth daily.)   pramipexole (MIRAPEX) 1.5 MG tablet Take 1.5 mg by mouth at bedtime.    rosuvastatin (CRESTOR) 20 MG tablet Take 1 tablet by mouth once daily   TRELEGY ELLIPTA 100-62.5-25 MCG/ACT AEPB Inhale 1 puff into the lungs daily. (Patient not taking: Reported on 05/16/2022)   No facility-administered encounter medications on file as of 05/16/2022.   Physical Exam: Blood pressure 132/60, pulse (!) 53, temperature 97.9 F (36.6 C), temperature source Oral, height '5\' 10"'$  (1.778 m), weight 185 lb (83.9 kg), SpO2 98 %. Gen:  No acute distress HEENT:  EOMI, sclera anicteric Neck:     No masses; no thyromegaly Lungs:    Clear to auscultation bilaterally; normal respiratory effort CV:         Regular rate and rhythm; no murmurs Abd:      + bowel sounds; soft, non-tender; no palpable masses, no distension Ext:    No edema; adequate peripheral perfusion Skin:      Warm and dry; no rash Neuro: alert and oriented x 3 Psych: normal  mood and affect   Data Reviewed: Imaging: CT chest [facility] 08/12/2015-bilateral interstitial changes with reticulation and groundglass opacities  High-res CT 03/14/2020- Patchy confluent mild-to-moderate subpleural reticulation and ground-glass opacity in both lungs with associated minimal traction bronchiolectasis and architectural distortion.  Alternate diagnosis.  No progression since 2017  High-res CT 05/11/2021-redemonstration of pulmonary fibrosis and alternate pattern. I have reviewed the images personally  PFTs: 03/18/2020 FVC 2.80 [61%], FEV1 2.37 [79%], F/F 85, TLC 4.70 (66%), DLCO 18.80 [75%] Mild restriction diffusion impairment  09/07/2021 FVC 2.47 (62%], FEV1 2.04 [72%], TLC 4.58 [6%], DLCO 15.03 [62%] Moderate restriction, diffusion impairment  Labs: CTD serologies 04/19/2021-significant for ANA 1:40  Cardiac: Echocardiogram 04/11/2021- EF 40-45%.  Normal RV systolic size and function.  Normal PA systolic pressure  Assessment:  Evaluation for interstitial lung disease Etiology of ILD is not clear but he does have some asbestos exposure in the past.  There are no signs and symptoms of connective tissue disease and ANA is borderline elevated  His CT and PFTs do show mild progression in my opinion.  Differential diagnosis at this point is IPF versus asbestosis.  Regardless of the diagnosis he meets criteria for antifibrotic treatment given progressive phenotype.  He is not a candidate for lung biopsy  We had a long discussion about antifibrotic therapy with patient and his wife.  They do not want to go ahead with treatment due to the side effects involved with therapy.  Follow-up CT reviewed which shows stability.  His breathing seems worse at the Trelegy was stopped and will restart this  Pulmonary embolism He had a hospitalization in [place] in January 2023. [name] appears to be unprovoked.  It would be reasonable to do indefinite anticoagulation due to unprovoked  nature of the venous thromboembolism  Plan/Recommendations: PFTs in 6 months Resume Trelegy inhaler.  Marshell Garfinkel MD [facility] 05/16/2022, 11:57 AM  CC: Maryella Shivers, MD

## 2022-05-16 NOTE — Patient Instructions (Signed)
We will reorder the Trelegy 100 inhaler.  Please resume the inhaler therapy Will order PFTs in 6 months and follow-up in clinic in 6 months after PFTs

## 2022-05-17 ENCOUNTER — Telehealth: Payer: Self-pay | Admitting: Cardiology

## 2022-05-17 DIAGNOSIS — I480 Paroxysmal atrial fibrillation: Secondary | ICD-10-CM | POA: Diagnosis not present

## 2022-05-17 NOTE — Telephone Encounter (Signed)
Received a STAT call from [place] at [facility] [employer]. She reported that the patient had sustained VT heart rhythm for 30 seconds on 04/29/22. I printed out the zio report and informed Dr. Agustin Cree. He stated that he would review the patients chart and send an order if needed.

## 2022-05-17 NOTE — Telephone Encounter (Signed)
Caller is reporting abnormal readings

## 2022-05-23 NOTE — Telephone Encounter (Signed)
Application approved until 06/10/2022. I will resubmit information in January. Patient notified through my chart

## 2022-05-24 ENCOUNTER — Telehealth: Payer: Self-pay

## 2022-05-24 ENCOUNTER — Other Ambulatory Visit: Payer: Self-pay | Admitting: Cardiology

## 2022-05-24 DIAGNOSIS — I472 Ventricular tachycardia, unspecified: Secondary | ICD-10-CM

## 2022-05-24 NOTE — Telephone Encounter (Signed)
Refill sent to pharmacy.   

## 2022-05-24 NOTE — Telephone Encounter (Signed)
-----   Message from Park Liter, MD sent at 05/24/2022  3:48 PM EST ----- Monitor showed multiple episode of ventricular tachycardia.  Please refer him to EP for more advanced management

## 2022-05-28 DIAGNOSIS — Z86711 Personal history of pulmonary embolism: Secondary | ICD-10-CM | POA: Diagnosis not present

## 2022-05-28 DIAGNOSIS — I255 Ischemic cardiomyopathy: Secondary | ICD-10-CM | POA: Diagnosis not present

## 2022-05-28 DIAGNOSIS — Z7901 Long term (current) use of anticoagulants: Secondary | ICD-10-CM | POA: Diagnosis not present

## 2022-05-28 DIAGNOSIS — I2510 Atherosclerotic heart disease of native coronary artery without angina pectoris: Secondary | ICD-10-CM | POA: Diagnosis not present

## 2022-05-28 DIAGNOSIS — I480 Paroxysmal atrial fibrillation: Secondary | ICD-10-CM | POA: Diagnosis not present

## 2022-05-28 DIAGNOSIS — G4733 Obstructive sleep apnea (adult) (pediatric): Secondary | ICD-10-CM | POA: Diagnosis not present

## 2022-05-28 DIAGNOSIS — J849 Interstitial pulmonary disease, unspecified: Secondary | ICD-10-CM | POA: Diagnosis not present

## 2022-05-29 DIAGNOSIS — L570 Actinic keratosis: Secondary | ICD-10-CM | POA: Diagnosis not present

## 2022-05-29 DIAGNOSIS — L578 Other skin changes due to chronic exposure to nonionizing radiation: Secondary | ICD-10-CM | POA: Diagnosis not present

## 2022-05-29 DIAGNOSIS — L821 Other seborrheic keratosis: Secondary | ICD-10-CM | POA: Diagnosis not present

## 2022-05-30 ENCOUNTER — Telehealth: Payer: Self-pay | Admitting: Cardiology

## 2022-05-30 NOTE — Telephone Encounter (Signed)
Patient's wife is calling to talk with Aram Candela in regards to the medication Eliquis. Please call back

## 2022-05-31 DIAGNOSIS — Z974 Presence of external hearing-aid: Secondary | ICD-10-CM | POA: Diagnosis not present

## 2022-05-31 DIAGNOSIS — H9193 Unspecified hearing loss, bilateral: Secondary | ICD-10-CM | POA: Diagnosis not present

## 2022-05-31 DIAGNOSIS — H9319 Tinnitus, unspecified ear: Secondary | ICD-10-CM | POA: Diagnosis not present

## 2022-06-11 DIAGNOSIS — E039 Hypothyroidism, unspecified: Secondary | ICD-10-CM | POA: Diagnosis not present

## 2022-06-11 DIAGNOSIS — I10 Essential (primary) hypertension: Secondary | ICD-10-CM | POA: Diagnosis not present

## 2022-06-13 NOTE — Telephone Encounter (Signed)
I have all documents needed to refax for 2024. I called the patient, unable to reach or LM. Forms faxed

## 2022-06-14 ENCOUNTER — Other Ambulatory Visit: Payer: Self-pay

## 2022-06-14 MED ORDER — NITROGLYCERIN 0.4 MG SL SUBL: 0.4000 mg | SUBLINGUAL_TABLET | SUBLINGUAL | 3 refills | Status: DC | PRN

## 2022-06-14 NOTE — Telephone Encounter (Signed)
Nitroglycerin sent

## 2022-06-18 ENCOUNTER — Encounter: Payer: Self-pay | Admitting: Cardiology

## 2022-06-18 ENCOUNTER — Ambulatory Visit: Payer: PPO | Attending: Cardiology

## 2022-06-18 ENCOUNTER — Ambulatory Visit: Payer: PPO | Attending: Cardiology | Admitting: Cardiology

## 2022-06-18 VITALS — BP 144/64 | HR 57 | Ht 70.0 in | Wt 188.4 lb

## 2022-06-18 DIAGNOSIS — I472 Ventricular tachycardia, unspecified: Secondary | ICD-10-CM

## 2022-06-18 DIAGNOSIS — E785 Hyperlipidemia, unspecified: Secondary | ICD-10-CM | POA: Diagnosis not present

## 2022-06-18 DIAGNOSIS — Z125 Encounter for screening for malignant neoplasm of prostate: Secondary | ICD-10-CM | POA: Diagnosis not present

## 2022-06-18 DIAGNOSIS — I2510 Atherosclerotic heart disease of native coronary artery without angina pectoris: Secondary | ICD-10-CM

## 2022-06-18 DIAGNOSIS — E039 Hypothyroidism, unspecified: Secondary | ICD-10-CM | POA: Diagnosis not present

## 2022-06-18 DIAGNOSIS — I10 Essential (primary) hypertension: Secondary | ICD-10-CM | POA: Diagnosis not present

## 2022-06-18 MED ORDER — MEXILETINE HCL 250 MG PO CAPS: 250.0000 mg | ORAL_CAPSULE | Freq: Three times a day (TID) | ORAL | 3 refills | Status: DC

## 2022-06-18 NOTE — Progress Notes (Unsigned)
Enrolled for [employer] to mail a ZIO XT long term holter monitor to the patients address on file.  To be delivered for 02/09/6605 application.

## 2022-06-18 NOTE — Progress Notes (Signed)
Electrophysiology Office Note   Date:  06/18/2022   ID:  Shane Massey, DOB 04/14/1941, MRN [id number]  PCP:  Maryella Shivers, MD  Cardiologist:  Agustin Cree Primary Electrophysiologist:  Niki Cosman Meredith Leeds, MD    Chief Complaint: VT   History of Present Illness: Shane Massey is a 82 y.o. male who is being seen today for the evaluation of VT at the request of Park Liter, MD. Presenting today for electrophysiology evaluation.  He has a history significant for coronary artery disease, chronic systolic heart failure, obstructive sleep apnea on CPAP, paroxysmal atrial fibrillation essential, hypertension, hyperlipidemia.  He is CABG in 2021.  He has been having more frequent episodes of shortness of breath.  He presented to [facility] and was found to have a PE.  He was anticoagulated at that time.  He then presented to cardiology clinic and was continuing to have episodes of palpitations.  He wore a cardiac monitor that showed episodes of ventricular tachycardia.  He continues to have episodic palpitations.  He states the palpitations occur a few times a week and make him feel poorly for up to 2 minutes at a time.  When he is not having palpitations he has been doing well without complaint.  Today, he denies symptoms of chest pain, shortness of breath, orthopnea, PND, lower extremity edema, claudication, dizziness, presyncope, syncope, bleeding, or neurologic sequela. The patient is tolerating medications without difficulties.    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[place])  04/07/2020   Ischemic cardiomyopathy 10/24/2016   Overview:  Ejection fraction 45% in spring 2018   Myocardial infarction [facility])    Near syncope    Nonsustained ventricular tachycardia ([place]) 07/17/2018   OSA (obstructive sleep apnea)    OSA on CPAP 11/14/2015   CPAP 13    Paroxysmal atrial fibrillation (HCC) 09/09/2019   Pulmonary embolism (HCC)    Restless leg syndrome 11/30/2016   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   Past Surgical History:  Procedure Laterality Date   CATARACT EXTRACTION  2012   COLONOSCOPY  2013   CORONARY ARTERY BYPASS GRAFT N/A 08/13/2019   Procedure: CORONARY ARTERY BYPASS GRAFTING (CABG) TIMES 3.;  Surgeon: Wonda Olds, MD;  Location: [place];  Service: Open Heart Surgery;  Laterality: N/A;   CORONARY STENT PLACEMENT     heart stent Left 09/2015   HERNIA REPAIR  11/1977   LEFT HEART CATH AND CORONARY ANGIOGRAPHY N/A 08/06/2019   Procedure: LEFT HEART CATH AND CORONARY ANGIOGRAPHY;  Surgeon: Troy Sine, MD;  Location: [place] CV LAB;  Service: Cardiovascular;  Laterality: N/A;   RADIAL ARTERY HARVEST Left 08/13/2019   Procedure: OPEN RADIAL ARTERY HARVEST;  Surgeon: Wonda Olds, MD;  Location: [place];  Service: Open Heart Surgery;  Laterality: Left;   REPAIR OF PATENT FORAMEN OVALE N/A 08/13/2019   Procedure: CLOSURE OF PATENT FORAMEN OVALE;  Surgeon: Wonda Olds, MD;  Location: [place];  Service: Open Heart Surgery;  Laterality: N/A;   RTC repair  Right 11/03/2020   SURGERY FOR BOWEL  ABSCESS  10/1959   TEE WITHOUT CARDIOVERSION N/A 08/13/2019   Procedure: TRANSESOPHAGEAL ECHOCARDIOGRAM (TEE);  Surgeon: Wonda Olds, MD;  Location: [place];  Service: Open Heart Surgery;  Laterality: N/A;     Current Outpatient Medications  Medication Sig Dispense Refill   apixaban (ELIQUIS) 5 MG TABS tablet Take 1 tablet (5 mg total) by mouth 2 (two) times daily. 90 tablet 3   Calcium Carb-Cholecalciferol (CALCIUM 600+D3 PO) Take 1 tablet by mouth daily at 12  noon.     clopidogrel (PLAVIX) 75 MG tablet Take 1 tablet (75 mg total) by mouth daily. 90 tablet 2   dexlansoprazole (DEXILANT) 60 MG capsule Take 60 mg by mouth daily before breakfast.     furosemide (LASIX) 40 MG tablet TAKE 1 & 1/2 (ONE & ONE-HALF) TABLETS BY MOUTH ONCE DAILY 135 tablet 0   isosorbide mononitrate (IMDUR) 60 MG 24 hr tablet Take 1 tablet (60 mg total) by mouth daily. 90 tablet 3   levothyroxine (SYNTHROID) 75 MCG tablet Take 75 mcg by mouth daily before breakfast.     mexiletine (MEXITIL) 250 MG capsule Take 1 capsule (250 mg total) by mouth 3 (three) times daily. 60 capsule 3   Multiple Vitamin (MULTIVITAMIN WITH MINERALS) TABS tablet Take 1 tablet by mouth daily at 12 noon. Unknown strength per patient     nitroGLYCERIN (NITROSTAT) 0.4 MG SL tablet Place 1 tablet (0.4 mg total) under the tongue every 5 (five) minutes as needed for chest pain. 25 tablet 3   potassium chloride (KLOR-CON) 10 MEQ tablet Take 1 tablet by mouth once daily (Patient taking differently: Take 10 mEq by mouth daily.) 90 tablet 3   pramipexole (MIRAPEX) 1.5 MG tablet Take 1.5 mg by mouth at bedtime.      rosuvastatin (CRESTOR) 20 MG tablet Take 1 tablet by mouth once daily 90 tablet 2   TRELEGY ELLIPTA 100-62.5-25 MCG/ACT AEPB Inhale 1 puff into the lungs daily. 60 each 11   No current facility-administered medications for this visit.    Allergies:   Patient has no known allergies.   Social History:  The patient  reports that he quit smoking about 64 years ago. His smoking use included cigarettes. He has a 2.00 pack-year smoking history. He quit smokeless tobacco use about 63 years ago.  His smokeless tobacco use included chew. He reports that he does not drink alcohol and does not use drugs.   Family History:  The patient's family history includes Asthma in his brother and maternal grandmother; Heart attack in his brother; Heart disease in his brother; Stroke in his father.    ROS:  Please see the  history of present illness.   Otherwise, review of systems is positive for none.   All other systems are reviewed and negative.    PHYSICAL EXAM: VS:  BP (!) 144/64   Pulse (!) 57   Ht '5\' 10"'$  (1.778 m)   Wt 188 lb 6.4 oz (85.5 kg)   SpO2 96%   BMI 27.03 kg/m  , BMI Body mass index is 27.03 kg/m. GEN: Well nourished, well developed, in no acute distress  HEENT: normal  Neck: no JVD, carotid bruits, or masses Cardiac: RRR; no murmurs, rubs, or gallops,no edema  Respiratory:  clear to auscultation bilaterally, normal work of breathing GI: soft, nontender, nondistended, + BS MS: no deformity or atrophy  Skin: warm and dry Neuro:  Strength and sensation are intact Psych: euthymic mood, full affect  EKG:  EKG is  not ordered today. Personal review of the ekg ordered 07/07/21 shows sinus rhythm  Recent Labs: 08/04/2021: BUN 15; Creatinine, Ser 0.87; Hemoglobin 11.6; NT-Pro BNP 479; Platelets 218; Potassium 4.4; Sodium 142    Lipid Panel     Component Value Date/Time   CHOL 128 04/12/2020 0913   TRIG 111 04/12/2020 0913   HDL 48 04/12/2020 0913   CHOLHDL 2.7 04/12/2020 0913   CHOLHDL 2.5 08/13/2019 0603   VLDL 25 08/13/2019 0603   LDLCALC 60 04/12/2020 0913     Wt Readings from Last 3 Encounters:  06/18/22 188 lb 6.4 oz (85.5 kg)  05/16/22 185 lb (83.9 kg)  04/27/22 187 lb 12.8 oz (85.2 kg)      Other studies Reviewed: Additional studies/ records that were reviewed today include: TTE 04/18/2022 Review of the above records today demonstrates:  LV chamber size mildly dilated Ejection fraction 45 to 50% Mild aortic sclerosis without evidence of stenosis  Cardiac monitor 05/22/2022 personally reviewed 10 episodes of ventricular tachycardia, longest episode 44 seconds at rate of 190. 37 episode of supraventricular tachycardia with the longest episode 16.4 seconds at a rate of 111 Occasional PVCs with total burden 4.6%  ASSESSMENT AND PLAN:  1.  Ventricular tachycardia:  Has had multiple episodes on cardiac monitor, lasting up to 45 seconds.  Based on symptoms, he does have palpitations and shortness of breath.  Fortunately he has not had any syncope or near syncope.  If he does have syncope, he would benefit from ICD implant.  His ejection fraction is 45 to 50%, and thus he does not qualify for primary prevention.  He Jeremie Abdelaziz start him on mexiletine to 50 mg twice daily.  Byron Peacock have him wear a repeat monitor in 3 months to determine if he is continuing to have episodes.  2.  Coronary artery disease: Status post CABG x 3 no current chest pain.  Plan per primary cardiology.  3.  Interstitial lung disease: Followed by internal medicine.  Hoping to avoid amiodarone.    Current medicines are reviewed at length with the patient today.   The patient does not have concerns regarding his medicines.  The following changes were made today:  none  Labs/ tests ordered today include:  Orders Placed This Encounter  Procedures   LONG TERM MONITOR (3-14 DAYS)     Disposition:   FU with Amaan Meyer 3 months  Signed, Jazzlynn Rawe Meredith Leeds, MD  06/18/2022 12:17 PM     [address] [place] [id number] [phone] (office) [phone] (fax)

## 2022-06-18 NOTE — Patient Instructions (Addendum)
Medication Instructions:  Your physician has recommended you make the following change in your medication:  START Mexiletine 250 mg twice daily  *If you need a refill on your cardiac medications before your next appointment, please call your pharmacy*   Lab Work: None ordered   Testing/Procedures:                           ZIO XT- Long Term Monitor Instructions  Your physician has requested you wear a ZIO patch monitor for 14 days in 2 months (prior to your follow up with Dr. Curt Bears).  This is a single patch monitor. [employer] supplies one patch monitor per enrollment. Additional stickers are not available. Please do not apply patch if you will be having a Nuclear Stress Test,  Echocardiogram, Cardiac CT, MRI, or Chest Xray during the period you would be wearing the  monitor. The patch cannot be worn during these tests. You cannot remove and re-apply the  ZIO XT patch monitor.  Your ZIO patch monitor will be mailed 3 day USPS to your address on file. It may take 3-5 days  to receive your monitor after you have been enrolled.  Once you have received your monitor, please review the enclosed instructions. Your monitor  has already been registered assigning a specific monitor serial # to you.  Billing and Patient Assistance Program Information  We have supplied [employer] with any of your insurance information on file for billing purposes. [employer] offers a sliding scale Patient Assistance Program for patients that do not have  insurance, or whose insurance does not completely cover the cost of the ZIO monitor.  You must apply for the Patient Assistance Program to qualify for this discounted rate.  To apply, please call [employer] at [phone], select option 4, select option 2, ask to apply for  Patient Assistance Program. Theodore Demark will ask your household income, and how many people  are in your household. They will quote your out-of-pocket cost based on that information.  [employer] will also  be able to set up a 53-month interest-free payment plan if needed.  Applying the monitor   Shave hair from upper left chest.  Hold abrader disc by orange tab. Rub abrader in 40 strokes over the upper left chest as  indicated in your monitor instructions.  Clean area with 4 enclosed alcohol pads. Let dry.  Apply patch as indicated in monitor instructions. Patch will be placed under collarbone on left  side of chest with arrow pointing upward.  Rub patch adhesive wings for 2 minutes. Remove white label marked "1". Remove the white  label marked "2". Rub patch adhesive wings for 2 additional minutes.  While looking in a mirror, press and release button in center of patch. A small green light will  flash 3-4 times. This will be your only indicator that the monitor has been turned on.  Do not shower for the first 24 hours. You may shower after the first 24 hours.  Press the button if you feel a symptom. You will hear a small click. Record Date, Time and  Symptom in the Patient Logbook.  When you are ready to remove the patch, follow instructions on the last 2 pages of Patient  Logbook. Stick patch monitor onto the last page of Patient Logbook.  Place Patient Logbook in the blue and white box. Use locking tab on box and tape box closed  securely. The blue and white box has prepaid postage  on it. Please place it in the mailbox as  soon as possible. Your physician should have your test results approximately 7 days after the  monitor has been mailed back to [facility].  Call [facility] at [phone] if you have questions regarding  your ZIO XT patch monitor. Call them immediately if you see an orange light blinking on your  monitor.  If your monitor falls off in less than 4 days, contact our Monitor department at [phone].  If your monitor becomes loose or falls off after 4 days call [employer] at [phone] for  suggestions on securing your  monitor     Follow-Up: At [facility], you and your health needs are our priority.  As part of our continuing mission to provide you with exceptional heart care, we have created designated Provider Care Teams.  These Care Teams include your primary Cardiologist (physician) and Advanced Practice Providers (APPs -  Physician Assistants and Nurse Practitioners) who all work together to provide you with the care you need, when you need it.   Your next appointment:   3 month(s)  The format for your next appointment:   In Person  Provider:   Allegra Lai, MD    Thank you for choosing [facility]!!   Trinidad Curet, RN [phone]  Other Instructions   Mexiletine Capsules What is this medication? MEXILETINE (mex IL e teen) treats a fast or irregular heartbeat (arrhythmia). It works by slowing down overactive electric signals in the heart, which stabilizes your heart rhythm. It belongs to a group of medications called antiarrhythmics. This medicine may be used for other purposes; ask your health care provider or pharmacist if you have questions. COMMON BRAND NAME(S): Mexitil What should I tell my care team before I take this medication? They need to know if you have any of these conditions: Liver disease Other heart problems Previous heart attack An unusual or allergic reaction to mexiletine, other medications, foods, dyes, or preservatives Pregnant or trying to get pregnant Breast-feeding How should I use this medication? Take this medication by mouth with a glass of water. Follow the directions on the prescription label. It is recommended that you take this medication with food or an antacid. Take your doses at regular intervals. Do not take your medication more often than directed. Do not stop taking except on the advice of your care team. Talk to your care team about the use of this medication in children. Special care may be needed. Overdosage: If you think you have  taken too much of this medicine contact a poison control center or emergency room at once. NOTE: This medicine is only for you. Do not share this medicine with others. What if I miss a dose? If you miss a dose, take it as soon as you can. If it is almost time for your next dose, take only that dose. Do not take double or extra doses. What may interact with this medication? Do not take this medication with any of the following: Dofetilide This medication may also interact with the following: Caffeine Cimetidine Medications for depression, anxiety, or psychotic disturbances Medications to control heart rhythm Phenobarbital Phenytoin Rifampin Theophylline This list may not describe all possible interactions. Give your health care provider a list of all the medicines, herbs, non-prescription drugs, or dietary supplements you use. Also tell them if you smoke, drink alcohol, or use illegal drugs. Some items may interact with your medicine. What should I watch for while using this medication?  Your condition will be monitored closely when you first begin therapy. Often, this medication is first started in a hospital or other monitored health care setting. Once you are on maintenance therapy, visit your care team for regular checks on your progress. Because your condition and use of this medication carry some risk, it is a good idea to carry an identification card, necklace or bracelet with details of your condition, medications, and care team. You may get drowsy or dizzy. Do not drive, use machinery, or do anything that needs mental alertness until you know how this medication affects you. Do not stand or sit up quickly, especially if you are an older patient. This reduces the risk of dizzy or fainting spells. Alcohol can make you more dizzy, increase flushing and rapid heartbeats. Avoid alcoholic drinks. This medication may cause serious skin reactions. They can happen weeks to months after starting the  medication. Contact your care team right away if you notice fevers or flu-like symptoms with a rash. The rash may be red or purple and then turn into blisters or peeling of the skin. Or, you might notice a red rash with swelling of the face, lips or lymph nodes in your neck or under your arms. What side effects may I notice from receiving this medication? Side effects that you should report to your care team as soon as possible: Allergic reactions--skin rash, itching, hives, swelling of the face, lips, tongue, or throat Heart rhythm changes--fast or irregular heartbeat, dizziness, feeling faint or lightheaded, chest pain, trouble breathing Infection--fever, chills, cough, or sore throat Liver injury--right upper belly pain, loss of appetite, nausea, light-colored stool, dark yellow or brown urine, yellowing skin or eyes, unusual weakness or fatigue Rash, fever, and swollen lymph nodes Seizures Unusual bruising or bleeding Side effects that usually do not require medical attention (report to your care team if they continue or are bothersome): Anxiety, nervousness Blurry vision Dizziness Headache Heartburn Nausea Tremors or shaking Vomiting This list may not describe all possible side effects. Call your doctor for medical advice about side effects. You may report side effects to FDA at 1-800-FDA-1088. Where should I keep my medication? Keep out of reach of children. Store at room temperature between 15 and 30 degrees C (59 and 86 degrees F). Throw away any unused medication after the expiration date. NOTE: This sheet is a summary. It may not cover all possible information. If you have questions about this medicine, talk to your doctor, pharmacist, or health care provider.  2023 Elsevier/Gold Standard (2020-12-07 00:00:00)    Important Information About Sugar

## 2022-06-25 DIAGNOSIS — E785 Hyperlipidemia, unspecified: Secondary | ICD-10-CM | POA: Diagnosis not present

## 2022-06-25 DIAGNOSIS — Z1331 Encounter for screening for depression: Secondary | ICD-10-CM | POA: Diagnosis not present

## 2022-06-25 DIAGNOSIS — I10 Essential (primary) hypertension: Secondary | ICD-10-CM | POA: Diagnosis not present

## 2022-06-25 DIAGNOSIS — I700 Atherosclerosis of aorta: Secondary | ICD-10-CM | POA: Diagnosis not present

## 2022-06-25 DIAGNOSIS — E039 Hypothyroidism, unspecified: Secondary | ICD-10-CM | POA: Diagnosis not present

## 2022-06-25 DIAGNOSIS — R7303 Prediabetes: Secondary | ICD-10-CM | POA: Diagnosis not present

## 2022-06-25 DIAGNOSIS — I2510 Atherosclerotic heart disease of native coronary artery without angina pectoris: Secondary | ICD-10-CM | POA: Diagnosis not present

## 2022-06-25 DIAGNOSIS — Z139 Encounter for screening, unspecified: Secondary | ICD-10-CM | POA: Diagnosis not present

## 2022-06-28 ENCOUNTER — Telehealth: Payer: Self-pay

## 2022-06-28 NOTE — Telephone Encounter (Signed)
Per [place], BMS application still pending

## 2022-06-29 ENCOUNTER — Telehealth: Payer: Self-pay | Admitting: Cardiology

## 2022-06-29 NOTE — Telephone Encounter (Signed)
Pt going to continue monitoring for now. Episode this morning did not last but a minute. He was at the store when occurred. He will let us know if reoccurs/worsens/symptomatic. Patient verbalized understanding and agreeable to plan.

## 2022-06-29 NOTE — Telephone Encounter (Signed)
Pt c/o medication issue:  1. Name of Medication:   mexiletine (MEXITIL) 250 MG capsule   2. How are you currently taking this medication (dosage and times per day)?  As prescribed  3. Are you having a reaction (difficulty breathing--STAT)?   No  4. What is your medication issue?   Patient c/o Palpitations:  High priority if patient c/o lightheadedness, shortness of breath, or chest pain  How long have you had palpitations/irregular HR/ Afib? Are you having the symptoms now? No  Are you currently experiencing lightheadedness, SOB or CP?   No  Do you have a history of afib (atrial fibrillation) or irregular heart rhythm?   Yes  Have you checked your BP or HR? (document readings if available): No  Are you experiencing any other symptoms?  No  Wife stated the patient had two episodes of rapid heart beats today . Wife stated patient had missed a dose yesterday morning and is concerned that may have caused his increased rapid heart beats episodes.

## 2022-07-02 ENCOUNTER — Other Ambulatory Visit: Payer: Self-pay | Admitting: Cardiology

## 2022-07-02 DIAGNOSIS — I209 Angina pectoris, unspecified: Secondary | ICD-10-CM

## 2022-07-02 DIAGNOSIS — I25708 Atherosclerosis of coronary artery bypass graft(s), unspecified, with other forms of angina pectoris: Secondary | ICD-10-CM

## 2022-07-02 DIAGNOSIS — I255 Ischemic cardiomyopathy: Secondary | ICD-10-CM

## 2022-07-02 DIAGNOSIS — Z951 Presence of aortocoronary bypass graft: Secondary | ICD-10-CM

## 2022-07-02 DIAGNOSIS — E785 Hyperlipidemia, unspecified: Secondary | ICD-10-CM

## 2022-07-02 DIAGNOSIS — I10 Essential (primary) hypertension: Secondary | ICD-10-CM

## 2022-07-02 DIAGNOSIS — G4733 Obstructive sleep apnea (adult) (pediatric): Secondary | ICD-10-CM

## 2022-07-10 ENCOUNTER — Other Ambulatory Visit: Payer: Self-pay | Admitting: Cardiology

## 2022-07-10 DIAGNOSIS — H26492 Other secondary cataract, left eye: Secondary | ICD-10-CM | POA: Diagnosis not present

## 2022-07-10 DIAGNOSIS — Z961 Presence of intraocular lens: Secondary | ICD-10-CM | POA: Diagnosis not present

## 2022-07-10 DIAGNOSIS — L980 Pyogenic granuloma: Secondary | ICD-10-CM | POA: Diagnosis not present

## 2022-07-10 DIAGNOSIS — H43393 Other vitreous opacities, bilateral: Secondary | ICD-10-CM | POA: Diagnosis not present

## 2022-07-10 DIAGNOSIS — H02402 Unspecified ptosis of left eyelid: Secondary | ICD-10-CM | POA: Diagnosis not present

## 2022-07-11 DIAGNOSIS — I480 Paroxysmal atrial fibrillation: Secondary | ICD-10-CM | POA: Diagnosis not present

## 2022-07-11 DIAGNOSIS — G4733 Obstructive sleep apnea (adult) (pediatric): Secondary | ICD-10-CM | POA: Diagnosis not present

## 2022-07-11 DIAGNOSIS — J849 Interstitial pulmonary disease, unspecified: Secondary | ICD-10-CM | POA: Diagnosis not present

## 2022-07-12 DIAGNOSIS — I10 Essential (primary) hypertension: Secondary | ICD-10-CM | POA: Diagnosis not present

## 2022-07-12 DIAGNOSIS — E039 Hypothyroidism, unspecified: Secondary | ICD-10-CM | POA: Diagnosis not present

## 2022-07-31 ENCOUNTER — Encounter: Payer: Self-pay | Admitting: Cardiology

## 2022-07-31 ENCOUNTER — Ambulatory Visit: Payer: PPO | Attending: Cardiology | Admitting: Cardiology

## 2022-07-31 VITALS — BP 130/68 | HR 56 | Ht 70.0 in | Wt 186.0 lb

## 2022-07-31 DIAGNOSIS — E785 Hyperlipidemia, unspecified: Secondary | ICD-10-CM

## 2022-07-31 DIAGNOSIS — I480 Paroxysmal atrial fibrillation: Secondary | ICD-10-CM | POA: Diagnosis not present

## 2022-07-31 DIAGNOSIS — Z86711 Personal history of pulmonary embolism: Secondary | ICD-10-CM | POA: Diagnosis not present

## 2022-07-31 DIAGNOSIS — I209 Angina pectoris, unspecified: Secondary | ICD-10-CM | POA: Diagnosis not present

## 2022-07-31 DIAGNOSIS — Z951 Presence of aortocoronary bypass graft: Secondary | ICD-10-CM | POA: Diagnosis not present

## 2022-07-31 DIAGNOSIS — G4733 Obstructive sleep apnea (adult) (pediatric): Secondary | ICD-10-CM | POA: Diagnosis not present

## 2022-07-31 HISTORY — DX: Personal history of pulmonary embolism: Z86.711

## 2022-07-31 NOTE — Progress Notes (Signed)
Cardiology Office Note:    Date:  07/31/2022   ID:  Shane Massey, DOB [birth year], MRN [id number]  PCP:  Maryella Shivers, MD  Cardiologist:  Jenne Campus, MD    Referring MD: Maryella Shivers, MD   Chief Complaint  Patient presents with   Follow-up    History of Present Illness:    Shane Massey is a 82 y.o. male  with past medical history significant for coronary artery disease.  In March 2017 he required PTCA and stenting of the mid LAD he also have history of ischemic cardiomyopathy however ejection fraction determined in January 2020 was normal.  He been complaining of fatigue tiredness for long period time eventually we decided to pursue cardiac catheterization.  That showed 60 to 75% stenosis of left main, 80% stenosis of ramus intermedius, 50% stenosis first diagonal branch on 08/13/2019 he ended up having coronary bypass graft with LIMA to LAD, left radial to second obtuse marginal branch and RIMA to ramus intermedius at the same time he did have PFO closure.  Recovery was complicated by an episode of atrial fibrillation successfully managed with  He has been struggling for a while with shortness of breath he had difficulty finding out what the problem was extensive evaluation has been performed included echocardiogram as well as stress test which was negative eventually D-dimer has been performed which was high CT of the chest showed pulmonary emboli.  End up being admitted to the hospital put on anticoagulation and discharged home however after that he end up 1 more time in the emergency room with shortness of breath no new pulmonary emboli.  Recently he started having some strength symptoms monitor has been placed he was find to have nonsustained ventricular tachycardia but frequent episodes, he was seen by my colleague Dr. Curt Bears in EP consultation.  He was put on mexiletine.  Since that time he is doing fine.  He denies have any chest pain tightness squeezing pressure burning  chest no palpitations dizziness.  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[place]) 04/07/2020   Ischemic cardiomyopathy 10/24/2016   Overview:  Ejection fraction 45% in spring 2018   Myocardial infarction [facility])    Near syncope    Nonsustained ventricular tachycardia ([place]) 07/17/2018   OSA (obstructive sleep apnea)    OSA on CPAP 11/14/2015   CPAP 13    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   Paroxysmal atrial fibrillation ([place]) 09/09/2019   Pulmonary embolism ([place])    Restless leg syndrome 11/30/2016   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    Past Surgical History:  Procedure Laterality Date   CATARACT EXTRACTION  2012   COLONOSCOPY  2013   CORONARY ARTERY BYPASS GRAFT N/A 08/13/2019   Procedure: CORONARY ARTERY BYPASS GRAFTING (CABG) TIMES 3.;  Surgeon: Wonda Olds, MD;  Location: [place];  Service: Open Heart Surgery;  Laterality: N/A;   CORONARY STENT PLACEMENT     heart stent Left 09/2015   HERNIA REPAIR  11/1977   LEFT HEART CATH AND CORONARY ANGIOGRAPHY N/A 08/06/2019  Procedure: LEFT HEART CATH AND CORONARY ANGIOGRAPHY;  Surgeon: Troy Sine, MD;  Location: [place] [facility];  Service: Cardiovascular;  Laterality: N/A;   RADIAL ARTERY HARVEST Left 08/13/2019   Procedure: OPEN RADIAL ARTERY HARVEST;  Surgeon: Wonda Olds, MD;  Location: [place];  Service: Open Heart Surgery;  Laterality: Left;   REPAIR OF PATENT FORAMEN OVALE N/A 08/13/2019   Procedure: CLOSURE OF PATENT FORAMEN OVALE;  Surgeon: Wonda Olds, MD;  Location: [place];  Service: Open Heart  Surgery;  Laterality: N/A;   RTC repair  Right 11/03/2020   SURGERY FOR BOWEL ABSCESS  10/1959   TEE WITHOUT CARDIOVERSION N/A 08/13/2019   Procedure: TRANSESOPHAGEAL ECHOCARDIOGRAM (TEE);  Surgeon: Wonda Olds, MD;  Location: [place];  Service: Open Heart Surgery;  Laterality: N/A;    Current Medications: Current Meds  Medication Sig   apixaban (ELIQUIS) 5 MG TABS tablet Take 1 tablet (5 mg total) by mouth 2 (two) times daily.   Calcium Carb-Cholecalciferol (CALCIUM 600+D3 PO) Take 1 tablet by mouth daily at 12 noon.   clopidogrel (PLAVIX) 75 MG tablet Take 1 tablet (75 mg total) by mouth daily.   dexlansoprazole (DEXILANT) 60 MG capsule Take 60 mg by mouth daily before breakfast.   furosemide (LASIX) 40 MG tablet TAKE 1 & 1/2 (ONE & ONE-HALF) TABLETS BY MOUTH ONCE DAILY   isosorbide mononitrate (IMDUR) 60 MG 24 hr tablet Take 1 tablet (60 mg total) by mouth daily.   levothyroxine (SYNTHROID) 75 MCG tablet Take 75 mcg by mouth daily before breakfast.   mexiletine (MEXITIL) 250 MG capsule Take 1 capsule (250 mg total) by mouth 3 (three) times daily.   Multiple Vitamin (MULTIVITAMIN WITH MINERALS) TABS tablet Take 1 tablet by mouth daily at 12 noon. Unknown strength per patient   nitroGLYCERIN (NITROSTAT) 0.4 MG SL tablet Place 1 tablet (0.4 mg total) under the tongue every 5 (five) minutes as needed for chest pain.   potassium chloride (KLOR-CON) 10 MEQ tablet Take 1 tablet by mouth once daily   pramipexole (MIRAPEX) 1.5 MG tablet Take 1.5 mg by mouth at bedtime.    rosuvastatin (CRESTOR) 20 MG tablet Take 1 tablet by mouth once daily   TRELEGY ELLIPTA 100-62.5-25 MCG/ACT AEPB Inhale 1 puff into the lungs daily.     Allergies:   Patient has no known allergies.   Social History   Socioeconomic History   Marital status: Married    Spouse name: Not on file   Number of children: Not on file   Years of education: Not on file   Highest education level: Not on file  Occupational  History   Not on file  Tobacco Use   Smoking status: Former    Packs/day: 0.50    Years: 4.00    Total pack years: 2.00    Types: Cigarettes    Quit date: 06/11/1958    Years since quitting: 64.1   Smokeless tobacco: Former    Types: Chew    Quit date: 12/01/1958  Vaping Use   Vaping Use: Never used  Substance and Sexual Activity   Alcohol use: No    Alcohol/week: 0.0 standard drinks of alcohol   Drug use: No   Sexual activity: Not on file  Other Topics Concern   Not on file  Social History Narrative   Not on file   Social Determinants of Health   Financial Resource Strain: Not on file  Food Insecurity: Not on file  Transportation Needs:  Not on file  Physical Activity: Not on file  Stress: Not on file  Social Connections: Not on file     Family History: The patient's family history includes Asthma in his brother and maternal grandmother; Heart attack in his brother; Heart disease in his brother; Stroke in his father. ROS:   Please see the history of present illness.    All 14 point review of systems negative except as described per history of present illness  EKGs/Labs/Other Studies Reviewed:      Recent Labs: 08/04/2021: BUN 15; Creatinine, Ser 0.87; Hemoglobin 11.6; NT-Pro BNP 479; Platelets 218; Potassium 4.4; Sodium 142  Recent Lipid Panel    Component Value Date/Time   CHOL 128 04/12/2020 0913   TRIG 111 04/12/2020 0913   HDL 48 04/12/2020 0913   CHOLHDL 2.7 04/12/2020 0913   CHOLHDL 2.5 08/13/2019 0603   VLDL 25 08/13/2019 0603   LDLCALC 60 04/12/2020 0913    Physical Exam:    VS:  BP 130/68 (BP Location: Left Arm, Patient Position: Sitting, Cuff Size: Normal)   Pulse (!) 56   Ht 5' 10"$  (1.778 m)   Wt 186 lb (84.4 kg)   SpO2 95%   BMI 26.69 kg/m     Wt Readings from Last 3 Encounters:  07/31/22 186 lb (84.4 kg)  06/18/22 188 lb 6.4 oz (85.5 kg)  05/16/22 185 lb (83.9 kg)     GEN:  Well nourished, well developed in no acute distress HEENT:  Normal NECK: No JVD; No carotid bruits LYMPHATICS: No lymphadenopathy CARDIAC: RRR, no murmurs, no rubs, no gallops RESPIRATORY:  Clear to auscultation without rales, wheezing or rhonchi  ABDOMEN: Soft, non-tender, non-distended MUSCULOSKELETAL:  No edema; No deformity  SKIN: Warm and dry LOWER EXTREMITIES: no swelling NEUROLOGIC:  Alert and oriented x 3 PSYCHIATRIC:  Normal affect   ASSESSMENT:    1. Angina pectoris ([place])   2. S/P CABG x 3   3. Dyslipidemia   4. Paroxysmal atrial fibrillation (HCC)   5. History of pulmonary embolism    PLAN:    In order of problems listed above:  Coronary artery disease.  Stable from that point review asymptomatic we will continue present management. Status post coronary bypass graft.  Stable Dyslipidemia: I did review his K PN which show me his LDL of 71 HDL 55 just from general 01/2023 good numbers, continue present management. Paroxysmal atrial fibrillation, continue anticoagulation Nonsustained ventricular tachycardia no dizziness no passing out continue mexiletine.  Plan is to put monitor on him in March and then follow-up with our EP colleagues   Medication Adjustments/Labs and Tests Ordered: Current medicines are reviewed at length with the patient today.  Concerns regarding medicines are outlined above.  No orders of the defined types were placed in this encounter.  Medication changes: No orders of the defined types were placed in this encounter.   Signed, Park Liter, MD, [facility] 07/31/2022 11:47 AM    [place]

## 2022-07-31 NOTE — Patient Instructions (Signed)
Medication Instructions:  Your physician recommends that you continue on your current medications as directed. Please refer to the Current Medication list given to you today.  *If you need a refill on your cardiac medications before your next appointment, please call your pharmacy*   Lab Work: None If you have labs (blood work) drawn today and your tests are completely normal, you will receive your results only by: [place] (if you have MyChart) OR A paper copy in the mail If you have any lab test that is abnormal or we need to change your treatment, we will call you to review the results.   Testing/Procedures: None   Follow-Up: At [facility], you and your health needs are our priority.  As part of our continuing mission to provide you with exceptional heart care, we have created designated Provider Care Teams.  These Care Teams include your primary Cardiologist (physician) and Advanced Practice Providers (APPs -  Physician Assistants and Nurse Practitioners) who all work together to provide you with the care you need, when you need it.  We recommend signing up for the patient portal called "MyChart".  Sign up information is provided on this After Visit Summary.  MyChart is used to connect with patients for Virtual Visits (Telemedicine).  Patients are able to view lab/test results, encounter notes, upcoming appointments, etc.  Non-urgent messages can be sent to your provider as well.   To learn more about what you can do with MyChart, go to [email or web address].    Your next appointment:   6 month(s)  Provider:   Jenne Campus, MD    Other Instructions None

## 2022-08-10 DIAGNOSIS — I10 Essential (primary) hypertension: Secondary | ICD-10-CM | POA: Diagnosis not present

## 2022-08-10 DIAGNOSIS — E039 Hypothyroidism, unspecified: Secondary | ICD-10-CM | POA: Diagnosis not present

## 2022-08-13 ENCOUNTER — Telehealth: Payer: Self-pay

## 2022-08-13 NOTE — Telephone Encounter (Signed)
Patient is aware need to meet 767.89 in OOP on his prescriptions before [employer] approves assistance.

## 2022-08-14 DIAGNOSIS — I472 Ventricular tachycardia, unspecified: Secondary | ICD-10-CM

## 2022-08-25 ENCOUNTER — Other Ambulatory Visit: Payer: Self-pay | Admitting: Cardiology

## 2022-08-27 NOTE — Telephone Encounter (Signed)
Rx refill sent to pharmacy. 

## 2022-09-03 DIAGNOSIS — I472 Ventricular tachycardia, unspecified: Secondary | ICD-10-CM | POA: Diagnosis not present

## 2022-09-07 DIAGNOSIS — Z136 Encounter for screening for cardiovascular disorders: Secondary | ICD-10-CM | POA: Diagnosis not present

## 2022-09-07 DIAGNOSIS — Z1331 Encounter for screening for depression: Secondary | ICD-10-CM | POA: Diagnosis not present

## 2022-09-07 DIAGNOSIS — Z1339 Encounter for screening examination for other mental health and behavioral disorders: Secondary | ICD-10-CM | POA: Diagnosis not present

## 2022-09-07 DIAGNOSIS — Z139 Encounter for screening, unspecified: Secondary | ICD-10-CM | POA: Diagnosis not present

## 2022-09-07 DIAGNOSIS — G4733 Obstructive sleep apnea (adult) (pediatric): Secondary | ICD-10-CM | POA: Diagnosis not present

## 2022-09-07 DIAGNOSIS — Z0189 Encounter for other specified special examinations: Secondary | ICD-10-CM | POA: Diagnosis not present

## 2022-09-07 DIAGNOSIS — Z0000 Encounter for general adult medical examination without abnormal findings: Secondary | ICD-10-CM | POA: Diagnosis not present

## 2022-09-10 ENCOUNTER — Telehealth: Payer: Self-pay | Admitting: Cardiology

## 2022-09-10 DIAGNOSIS — I480 Paroxysmal atrial fibrillation: Secondary | ICD-10-CM

## 2022-09-10 DIAGNOSIS — E039 Hypothyroidism, unspecified: Secondary | ICD-10-CM | POA: Diagnosis not present

## 2022-09-10 DIAGNOSIS — I10 Essential (primary) hypertension: Secondary | ICD-10-CM | POA: Diagnosis not present

## 2022-09-10 MED ORDER — APIXABAN 5 MG PO TABS: 5.0000 mg | ORAL_TABLET | Freq: Two times a day (BID) | ORAL | 1 refills | Status: DC

## 2022-09-10 NOTE — Telephone Encounter (Signed)
Eliquis 5mg  refill request received. Patient is 82 years old, weight-84.4kg, Crea-0.93 on 06/18/22 via [employer], [place], and last seen by Dr. Agustin Cree on 07/31/22. Dose is appropriate based on dosing criteria. Will send in refill to requested pharmacy.

## 2022-09-10 NOTE — Telephone Encounter (Signed)
*  STAT* If patient is at the pharmacy, call can be transferred to refill team.   1. Which medications need to be refilled? (please list name of each medication and dose if known) apixaban (ELIQUIS) 5 MG TABS tablet   2. Which pharmacy/location (including street and city if local pharmacy) is medication to be sent to? [facility] [place], [place]    3. Do they need a 30 day or 90 day supply? [place]

## 2022-09-18 ENCOUNTER — Telehealth: Payer: Self-pay | Admitting: Cardiology

## 2022-09-18 NOTE — Telephone Encounter (Signed)
Called the patient and confirmed that his appointment is with Dr. [name] is on 09/28/22 at 8:30 am. Patient was appreciative for the call and had no further questions at this time.

## 2022-09-18 NOTE — Telephone Encounter (Signed)
Wife returned RN's call. 

## 2022-09-20 ENCOUNTER — Telehealth: Payer: Self-pay | Admitting: Cardiology

## 2022-09-20 NOTE — Telephone Encounter (Signed)
Wife was returning call. Please advise ?

## 2022-09-20 NOTE — Telephone Encounter (Signed)
Spoke with patient 's wife and she is aware of monitor results. She verbalized undrstanding

## 2022-09-24 ENCOUNTER — Ambulatory Visit: Payer: PPO | Admitting: Cardiology

## 2022-09-27 DIAGNOSIS — L578 Other skin changes due to chronic exposure to nonionizing radiation: Secondary | ICD-10-CM | POA: Diagnosis not present

## 2022-09-27 DIAGNOSIS — L821 Other seborrheic keratosis: Secondary | ICD-10-CM | POA: Diagnosis not present

## 2022-09-27 DIAGNOSIS — L820 Inflamed seborrheic keratosis: Secondary | ICD-10-CM | POA: Diagnosis not present

## 2022-09-27 DIAGNOSIS — L570 Actinic keratosis: Secondary | ICD-10-CM | POA: Diagnosis not present

## 2022-09-28 ENCOUNTER — Ambulatory Visit: Payer: PPO | Attending: Cardiology | Admitting: Cardiology

## 2022-09-28 ENCOUNTER — Encounter: Payer: Self-pay | Admitting: Cardiology

## 2022-09-28 VITALS — BP 128/68 | HR 57 | Ht 70.0 in | Wt 182.0 lb

## 2022-09-28 DIAGNOSIS — I472 Ventricular tachycardia, unspecified: Secondary | ICD-10-CM

## 2022-09-28 DIAGNOSIS — I25118 Atherosclerotic heart disease of native coronary artery with other forms of angina pectoris: Secondary | ICD-10-CM

## 2022-09-28 MED ORDER — MEXILETINE HCL 250 MG PO CAPS: 250.0000 mg | ORAL_CAPSULE | Freq: Two times a day (BID) | ORAL | 2 refills | Status: DC

## 2022-09-28 NOTE — Progress Notes (Signed)
Electrophysiology Office Note   Date:  09/28/2022   ID:  Shane Massey, DOB 1940-12-27, MRN [id number]  PCP:  Charlott Rakes, MD  Cardiologist:  Bing Matter Primary Electrophysiologist:  Jamel Dunton [name], MD    Chief Complaint: VT   History of Present Illness: Shane Massey is a 82 y.o. male who is being seen today for the evaluation of VT at the request of Charlott Rakes, MD. Presenting today for electrophysiology evaluation.  He has a history seen for coronary artery disease, chronic [place] heart failure, OSA on CPAP, atrial fibrillation, hypertension, hyperlipidemia.  CABG in 2021.  He been having episodes of shortness of breath and presented around the hospital was found to have a PE.  He was anticoagulated at that time.  He wore a cardiac monitor that showed episodes of ventricular tachycardia.  Episodes lasted up to 45 seconds.  He did note intermittent palpitations.  He has since been started on mexiletine.  Today, denies symptoms of palpitations, chest pain, shortness of breath, orthopnea, PND, lower extremity edema, claudication, dizziness, presyncope, syncope, bleeding, or neurologic sequela. The patient is tolerating medications without difficulties.  He continues to have intermittent episodes of chest pain.  He takes both Imdur and nitroglycerin.  He has seen improvement in his palpitations.  He continues to try and be as active as he can.  He notices palpitations have gotten better since starting mexiletine.   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   Ischemic cardiomyopathy  10/24/2016   Overview:  Ejection fraction 45% in spring 2018   Myocardial infarction    Near syncope    Nonsustained ventricular tachycardia 07/17/2018   OSA (obstructive sleep apnea)    OSA on CPAP 11/14/2015   CPAP 13    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   Paroxysmal atrial fibrillation 09/09/2019   Pulmonary embolism    Restless leg syndrome 11/30/2016   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   Past Surgical History:  Procedure Laterality Date   CATARACT EXTRACTION  2012   COLONOSCOPY  2013   CORONARY ARTERY BYPASS GRAFT N/A 08/13/2019   Procedure: CORONARY ARTERY BYPASS GRAFTING (CABG) TIMES 3.;  Surgeon: Linden Dolin, MD;  [facility]: [facility];  Service: Open Heart Surgery;  Laterality: N/A;   CORONARY STENT PLACEMENT     heart stent Left 09/2015   HERNIA REPAIR  11/1977   LEFT HEART CATH AND CORONARY ANGIOGRAPHY N/A 08/06/2019   Procedure: LEFT HEART CATH AND CORONARY ANGIOGRAPHY;  Surgeon: Lennette Bihari, MD;  [facility];  Service: Cardiovascular;  Laterality: N/A;   RADIAL ARTERY HARVEST Left 08/13/2019   Procedure: OPEN RADIAL ARTERY HARVEST;  Surgeon: Linden Dolin, MD;  Location: [facility];  Service: Open Heart Surgery;  Laterality: Left;   REPAIR OF PATENT FORAMEN OVALE N/A 08/13/2019   Procedure: CLOSURE OF PATENT FORAMEN OVALE;  Surgeon: Linden Dolin, MD;  [facility];  Service: Open Heart Surgery;  Laterality: N/A;   RTC repair  Right 11/03/2020   SURGERY FOR BOWEL ABSCESS  10/1959   TEE WITHOUT  CARDIOVERSION N/A 08/13/2019   Procedure: TRANSESOPHAGEAL ECHOCARDIOGRAM (TEE);  Surgeon: Linden Dolin, MD;  [facility];  Service: Open Heart Surgery;  Laterality: N/A;     Current Outpatient Medications  Medication Sig Dispense Refill   apixaban (ELIQUIS) 5 MG TABS tablet Take 1 tablet (5 mg total) by mouth 2 (two) times daily. 180 tablet 1   Calcium Carb-Cholecalciferol (CALCIUM 600+D3 PO) Take 1 tablet by mouth daily at 12 noon.      clopidogrel (PLAVIX) 75 MG tablet Take 1 tablet (75 mg total) by mouth daily. 90 tablet 2   dexlansoprazole (DEXILANT) 60 MG capsule Take 60 mg by mouth daily before breakfast.     furosemide (LASIX) 40 MG tablet Take 1.5 tablets (60 mg total) by mouth daily. 135 tablet 1   isosorbide mononitrate (IMDUR) 60 MG 24 hr tablet Take 1 tablet (60 mg total) by mouth daily. 90 tablet 3   levothyroxine (SYNTHROID) 75 MCG tablet Take 75 mcg by mouth daily before breakfast.     Multiple Vitamin (MULTIVITAMIN WITH MINERALS) TABS tablet Take 1 tablet by mouth daily at 12 noon. Unknown strength per patient     nitroGLYCERIN (NITROSTAT) 0.4 MG SL tablet Place 1 tablet (0.4 mg total) under the tongue every 5 (five) minutes as needed for chest pain. 25 tablet 3   potassium chloride (KLOR-CON) 10 MEQ tablet Take 1 tablet by mouth once daily 90 tablet 2   pramipexole (MIRAPEX) 1.5 MG tablet Take 1.5 mg by mouth at bedtime.      rosuvastatin (CRESTOR) 20 MG tablet Take 1 tablet by mouth once daily 90 tablet 2   TRELEGY ELLIPTA 100-62.5-25 MCG/ACT AEPB Inhale 1 puff into the lungs daily. 60 each 11   mexiletine (MEXITIL) 250 MG capsule Take 1 capsule (250 mg total) by mouth 2 (two) times daily. 180 capsule 2   No current facility-administered medications for this visit.    Allergies:   Patient has no known allergies.   Social History:  The patient  reports that he quit smoking about 64 years ago. His smoking use included cigarettes. He has a 2.00 pack-year smoking history. He quit smokeless tobacco use about 63 years ago.  His smokeless tobacco use included chew. He reports that he does not drink alcohol and does not use drugs.   Family History:  The patient's family history includes Asthma in his brother and maternal grandmother; Heart attack in his brother; Heart disease in his brother; Stroke in his father.   ROS:  Please see the history of present illness.   Otherwise, review of systems is positive for none.    All other systems are reviewed and negative.   PHYSICAL EXAM: VS:  BP 128/68   Pulse (!) 57   Ht  (1.778 m)   Wt 182 lb (82.6 kg)   BMI 26.11 kg/m  , BMI Body mass index is 26.11 kg/m. GEN: Well nourished, well developed, in no acute distress  HEENT: normal  Neck: no JVD, carotid bruits, or masses Cardiac: RRR; no murmurs, rubs, or gallops,no edema  Respiratory:  clear to auscultation bilaterally, normal work of breathing GI: soft, nontender, nondistended, + BS MS: no deformity or atrophy  Skin: warm and dry Neuro:  Strength and sensation are intact Psych: euthymic mood, full affect  EKG:  EKG is ordered today. Personal review of the ekg ordered shows sinus rhythm  Recent Labs: No results found for requested labs within last 365 days.    Lipid  Panel     Component Value Date/Time   CHOL 128 04/12/2020 0913   TRIG 111 04/12/2020 0913   HDL 48 04/12/2020 0913   CHOLHDL 2.7 04/12/2020 0913   CHOLHDL 2.5 08/13/2019 0603   VLDL 25 08/13/2019 0603   LDLCALC 60 04/12/2020 0913     Wt Readings from Last 3 Encounters:  09/28/22 182 lb (82.6 kg)  07/31/22 186 lb (84.4 kg)  06/18/22 188 lb 6.4 oz (85.5 kg)      Other studies Reviewed: Additional studies/ records that were reviewed today include: TTE 04/18/2022 Review of the above records today demonstrates:  LV chamber size mildly dilated Ejection fraction 45 to 50% Mild aortic sclerosis without evidence of stenosis  Cardiac monitor 09/06/22 personally reviewed Predominant rhythm was sinus rhythm Less than 1% ventricular and supraventricular ectopy Patient triggered episodes associated with sinus rhythm 2 VT episodes, longest 18 beats, asymptomatic  ASSESSMENT AND PLAN:  1.  Ventricular tachycardia: Has had multiple episodes lasting up to 45 seconds.  Currently on mexiletine 250 mg twice daily.  Had 2 episodes on recent monitor, longest 18 beats and was asymptomatic.  He is happy with his control of his  arrhythmia.  Jorryn Casagrande continue with current management.  2.  Coronary artery disease: Status post CABG x 3.  Intermittent chest pain controlled with nitroglycerin and Imdur.  Continue plan per primary cardiology.  3.  Interstitial lung disease: Followed by internal medicine.  Hoping to avoid amiodarone.   Current medicines are reviewed at length with the patient today.   The patient does not have concerns regarding his medicines.  The following changes were made today: None  Labs/ tests ordered today include:  Orders Placed This Encounter  Procedures   EKG 12-Lead     Disposition:   FU 6 months  Signed, Samaiyah Howes [name], MD  09/28/2022 8:45 AM     [facility] [address] [place] [address] [phone] (office) [phone] (fax)

## 2022-09-28 NOTE — Patient Instructions (Signed)
Medication Instructions:  °Your physician recommends that you continue on your current medications as directed. Please refer to the Current Medication list given to you today. ° °*If you need a refill on your cardiac medications before your next appointment, please call your pharmacy* ° ° °Lab Work: °None ordered ° ° °Testing/Procedures: °None ordered ° ° °Follow-Up: °At [facility], you and your health needs are our priority.  As part of our continuing mission to provide you with exceptional heart care, we have created designated Provider Care Teams.  These Care Teams include your primary Cardiologist (physician) and Advanced Practice Providers (APPs -  Physician Assistants and Nurse Practitioners) who all work together to provide you with the care you need, when you need it. ° °Your next appointment:   °6 month(s) ° °The format for your next appointment:   °In Person ° °Provider:   °Will Camnitz, MD ° ° ° °Thank you for choosing [facility]!! ° ° °Harol Shabazz, RN °[phone] °  °

## 2022-10-02 ENCOUNTER — Ambulatory Visit: Payer: PPO | Admitting: Podiatry

## 2022-10-02 ENCOUNTER — Ambulatory Visit (INDEPENDENT_AMBULATORY_CARE_PROVIDER_SITE_OTHER): Payer: PPO

## 2022-10-02 DIAGNOSIS — R52 Pain, unspecified: Secondary | ICD-10-CM | POA: Diagnosis not present

## 2022-10-02 DIAGNOSIS — M792 Neuralgia and neuritis, unspecified: Secondary | ICD-10-CM

## 2022-10-02 DIAGNOSIS — M19071 Primary osteoarthritis, right ankle and foot: Secondary | ICD-10-CM

## 2022-10-02 DIAGNOSIS — M19072 Primary osteoarthritis, left ankle and foot: Secondary | ICD-10-CM | POA: Diagnosis not present

## 2022-10-02 MED ORDER — GABAPENTIN 300 MG PO CAPS: 300.0000 mg | ORAL_CAPSULE | Freq: Every day | ORAL | 3 refills | Status: DC

## 2022-10-02 NOTE — Progress Notes (Signed)
  Subjective:  Patient ID: Shane Massey, male    DOB: Nov 15, 1940,  MRN: [id number]  Chief Complaint  Patient presents with   Foot Pain    Pain located in the toes and radiates to the midfoot, bilateral great hallux have the most pain, most times the ball of the foot hurts as well, has been a problem for about 3/4 months, prior treatment has been a brace provided by another foot provider    82 y.o. male presents with pain in the toes and midfoot of both feet.  He says that he feels like there is a burning tingling pain as well as aching pain in the foot.  He says the pain feels like he has spiders crawling on his leg sometimes sensation.  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   Ischemic cardiomyopathy 10/24/2016   Overview:  Ejection fraction 45% in spring 2018   Myocardial infarction    Near syncope    Nonsustained ventricular tachycardia 07/17/2018   OSA (obstructive sleep apnea)    OSA on CPAP 11/14/2015   CPAP 13    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  Capillary fill time < 3 sec to all digits.   Neruologic: Grossly diminished to light touch in the toes bilaterally.  Subjective sensation of burning tingling  pins-and-needles type pain to the level of the midfoot bilateral foot  Musculoskeletal: Osseous prominence about the midfoot consistent with osteoarthritis  Gait: Unassisted, Nonantalgic.   No images are attached to the encounter.  Radiographs:  Date: 10/02/2022 XR both feet Weightbearing AP/Lateral/Oblique   Findings: degenerative changes of the midtarsal and tarsometatarsal joint no obvious fracture identified Assessment:   1. Neuropathic pain   2. Pain   3. Arthritis of both midfeet      Plan:  Patient was evaluated and treated and all questions answered.  # Neuropathic pain  # Midfoot arthritis -Discussed with patient his symptoms do sound consistent with neuropathic pain -I recommend we try treatment with gabapentin 300 mg once daily at night to begin with -Discussed risk benefits and possible side effects associate with gabapentin including drowsiness -Also want patient to use Voltaren gel on both feet for arthritic changes in the midfoot  Return in about 6 weeks (around 11/13/2022) for f/u neuropathic pain.          Corinna Gab, DPM [facility] / [facility]

## 2022-10-10 DIAGNOSIS — I10 Essential (primary) hypertension: Secondary | ICD-10-CM | POA: Diagnosis not present

## 2022-10-10 DIAGNOSIS — E039 Hypothyroidism, unspecified: Secondary | ICD-10-CM | POA: Diagnosis not present

## 2022-10-15 DIAGNOSIS — G4733 Obstructive sleep apnea (adult) (pediatric): Secondary | ICD-10-CM | POA: Diagnosis not present

## 2022-10-19 ENCOUNTER — Ambulatory Visit (INDEPENDENT_AMBULATORY_CARE_PROVIDER_SITE_OTHER): Payer: PPO | Admitting: Pulmonary Disease

## 2022-10-19 ENCOUNTER — Ambulatory Visit: Payer: PPO | Admitting: Pulmonary Disease

## 2022-10-19 ENCOUNTER — Encounter: Payer: Self-pay | Admitting: Pulmonary Disease

## 2022-10-19 VITALS — BP 134/68 | HR 54 | Temp 97.7°F | Ht 70.0 in | Wt 185.4 lb

## 2022-10-19 DIAGNOSIS — J849 Interstitial pulmonary disease, unspecified: Secondary | ICD-10-CM

## 2022-10-19 LAB — PULMONARY FUNCTION TEST
DL/VA % pred: 96 %
DL/VA: 3.74 ml/min/mmHg/L
DLCO cor % pred: 72 %
DLCO cor: 17.63 ml/min/mmHg
DLCO unc % pred: 72 %
DLCO unc: 17.63 ml/min/mmHg
FEF 25-75 Post: 3.16 L/sec
FEF 25-75 Pre: 3.01 L/sec
FEF2575-%Change-Post: 5 %
FEF2575-%Pred-Post: 160 %
FEF2575-%Pred-Pre: 152 %
FEV1-%Change-Post: 0 %
FEV1-%Pred-Post: 83 %
FEV1-%Pred-Pre: 83 %
FEV1-Post: 2.39 L
FEV1-Pre: 2.39 L
FEV1FVC-%Change-Post: 2 %
FEV1FVC-%Pred-Pre: 117 %
FEV6-%Change-Post: -2 %
FEV6-%Pred-Post: 74 %
FEV6-%Pred-Pre: 75 %
FEV6-Post: 2.78 L
FEV6-Pre: 2.84 L
FEV6FVC-%Pred-Post: 107 %
FEV6FVC-%Pred-Pre: 107 %
FVC-%Change-Post: -2 %
FVC-%Pred-Post: 69 %
FVC-%Pred-Pre: 70 %
FVC-Post: 2.78 L
FVC-Pre: 2.84 L
Post FEV1/FVC ratio: 86 %
Post FEV6/FVC ratio: 100 %
Pre FEV1/FVC ratio: 84 %
Pre FEV6/FVC Ratio: 100 %
RV % pred: 67 %
RV: 1.79 L
TLC % pred: 63 %
TLC: 4.52 L

## 2022-10-19 NOTE — Progress Notes (Signed)
Shane Massey    [id number]    01-17-1941  Primary Care Physician:Hodges, Para March, MD  Referring Physician: Charlott Rakes, MD [address] [place],  [place] [address]  Chief complaint: Follow-up for ILD  HPI: 82 y.o.  with history of cardiomyopathy, OSA, GERD Here for evaluation of interstitial lung disease Complains of chronic dyspnea on exertion.  Has to stop after walking about 200 feet.  No symptoms at rest Denies any cough, sputum production, chest congestion or wheezing He had a high-res CT showing mild interstitial changes which are being monitored.  Previously tried on ICS/LABA inhaler with no improvement in symptoms  History also significant for ischemic cardiomyopathy for which she follows with Dr. Bing Matter.  Recent echocardiogram showed EF of 40-45% and losartan added instead of lisinopril OSA for which he is on BiPAP, managed by Dr. 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  Reports exposure to asbestos while working in the [employer] Exposures: Asbestos exposure as above.  No mold, hot tub, Jacuzzi.  No feather pillows or comforter Smoking history: Minimal smoking as a teenager Travel history: No significant travel Relevant family history: No family history of lung disease  Interim history: Continues on Trelegy which helps with the breathing Here for review of PFTs No new issues today.  Outpatient Encounter Medications as of 10/19/2022  Medication Sig   apixaban (ELIQUIS) 5 MG TABS tablet Take 1 tablet (5 mg total) by mouth 2 (two) times daily.   Calcium Carb-Cholecalciferol (CALCIUM 600+D3 PO) Take 1 tablet  by mouth daily at 12 noon.   clopidogrel (PLAVIX) 75 MG tablet Take 1 tablet (75 mg total) by mouth daily.   dexlansoprazole (DEXILANT) 60 MG capsule Take 60 mg by mouth daily before breakfast.   furosemide (LASIX) 40 MG tablet Take 1.5 tablets (60 mg total) by mouth daily.   gabapentin (NEURONTIN) 300 MG capsule Take 1 capsule (300 mg total) by mouth at bedtime.   isosorbide mononitrate (IMDUR) 60 MG 24 hr tablet Take 1 tablet (60 mg total) by mouth daily.   levothyroxine (SYNTHROID) 75 MCG tablet Take 75 mcg by mouth daily before breakfast.   mexiletine (MEXITIL) 250 MG capsule Take 1 capsule (250 mg total) by mouth 2 (two) times daily.   Multiple Vitamin (MULTIVITAMIN WITH MINERALS) TABS tablet Take 1 tablet by mouth daily at 12 noon. Unknown strength per patient   nitroGLYCERIN (NITROSTAT) 0.4 MG SL tablet Place 1 tablet (0.4 mg total) under the tongue every 5 (five) minutes as needed for chest pain.   potassium chloride (KLOR-CON) 10 MEQ tablet Take 1 tablet by mouth once daily   pramipexole (MIRAPEX) 1.5 MG tablet Take 1.5 mg by mouth at bedtime.    rosuvastatin (CRESTOR) 20 MG tablet Take 1 tablet by mouth once daily   TRELEGY ELLIPTA 100-62.5-25 MCG/ACT AEPB Inhale 1 puff into the lungs daily.   No facility-administered encounter medications on file as of 10/19/2022.   Physical Exam: Blood pressure 134/68, pulse (!) 54, temperature 97.7 F (36.5 C), height 5\' 10"  (1.778 m), weight 185 lb 6.4 oz (84.1 kg), SpO2 97 %. Gen:  No acute distress HEENT:  EOMI, sclera anicteric Neck:     No masses; no thyromegaly Lungs:    Clear to auscultation bilaterally; normal respiratory effort CV:         Regular rate and rhythm; no murmurs Abd:      + bowel sounds; soft, non-tender; no palpable masses, no distension Ext:    No edema; adequate peripheral perfusion Skin:      Warm and dry; no rash Neuro: alert and oriented x 3 Psych: normal mood and affect   Data Reviewed: Imaging: CT chest  [facility] 08/12/2015-bilateral interstitial changes with reticulation and groundglass opacities  High-res CT 03/14/2020- Patchy confluent mild-to-moderate subpleural reticulation and ground-glass opacity in both lungs with associated minimal traction bronchiolectasis and architectural distortion.  Alternate diagnosis.  No progression since 2017  High-res CT 05/11/2021-redemonstration of pulmonary fibrosis and alternate pattern. I have reviewed the images personally  PFTs: 03/18/2020 FVC 2.80 [61%], FEV1 2.37 [79%], F/F 85, TLC 4.70 (66%), DLCO 18.80 [75%] Mild restriction diffusion impairment  09/07/2021 FVC 2.47 (62%], FEV1 2.04 [72%], TLC 4.58 [6%], DLCO 15.03 [62%] Moderate restriction, diffusion impairment  10/19/2022 FVC 2.78 [69%], FEV1 2.39 [83%], F/F86, TLC 4.52 [63%], DLCO 17.63 [102%]  Labs: CTD serologies 04/19/2021-significant for ANA 1:40  Cardiac: Echocardiogram 04/11/2021- EF 40-45%.  Normal RV systolic size and function.  Normal PA systolic pressure  Assessment:  Evaluation for interstitial lung disease Etiology of ILD is not clear but he does have some asbestos exposure in the past.  There are no signs and symptoms of connective tissue disease and ANA is borderline elevated  His CT and PFTs do show mild progression in my opinion.  Differential diagnosis at this point is IPF versus asbestosis.  Regardless of the diagnosis he meets criteria for antifibrotic treatment given progressive phenotype.  He is not a candidate for lung biopsy  We had a long discussion about antifibrotic therapy with patient and his wife.  They do not want to go ahead with treatment due to the side effects involved with therapy.   CT shows some stability PFTs reviewed which are better than 2023 and over a longer time period since 2021 they have been stable.  Trelegy seems to be helping with his breathing and will continue the same  Pulmonary embolism He had a hospitalization in [place] in January  2023. [name] appears to be unprovoked.  It would be reasonable to do indefinite anticoagulation due to unprovoked nature of the venous thromboembolism  Plan/Recommendations: High-res CT in 6 months Continue Trelegy inhaler.  Chilton Greathouse MD [facility] 10/19/2022, 1:39 PM  CC: Charlott Rakes, MD

## 2022-10-19 NOTE — Addendum Note (Signed)
Addended by: Bradd Canary L on: 10/19/2022 03:46 PM   Modules accepted: Orders

## 2022-10-19 NOTE — Progress Notes (Signed)
Full PFT performed today. °

## 2022-10-19 NOTE — Patient Instructions (Signed)
Full PFT performed today. °

## 2022-10-19 NOTE — Patient Instructions (Signed)
I am glad you are doing well your breathing The PFTs look show some open compared to last year Will order high-res CT in 6 months Return to clinic in 6 months

## 2022-11-06 ENCOUNTER — Other Ambulatory Visit: Payer: Self-pay | Admitting: Cardiology

## 2022-11-06 NOTE — Telephone Encounter (Signed)
Rx refill sent to pharmacy. 

## 2022-11-10 DIAGNOSIS — I10 Essential (primary) hypertension: Secondary | ICD-10-CM | POA: Diagnosis not present

## 2022-11-10 DIAGNOSIS — E039 Hypothyroidism, unspecified: Secondary | ICD-10-CM | POA: Diagnosis not present

## 2022-11-13 ENCOUNTER — Ambulatory Visit: Payer: PPO | Admitting: Podiatry

## 2022-11-13 DIAGNOSIS — M792 Neuralgia and neuritis, unspecified: Secondary | ICD-10-CM

## 2022-11-13 DIAGNOSIS — M19072 Primary osteoarthritis, left ankle and foot: Secondary | ICD-10-CM | POA: Diagnosis not present

## 2022-11-13 DIAGNOSIS — M19071 Primary osteoarthritis, right ankle and foot: Secondary | ICD-10-CM | POA: Diagnosis not present

## 2022-11-13 MED ORDER — CICLOPIROX 8 % EX SOLN: Freq: Every day | CUTANEOUS | 0 refills | Status: DC

## 2022-11-13 NOTE — Progress Notes (Signed)
  Subjective:  Patient ID: Shane Massey, male    DOB: [birth year],  MRN: [id number]  Chief Complaint  Patient presents with   Follow-up    Neuropathic pain. Patient continues to take Gabapentin 300mg  nightly and applying Voltaren gel as needed and it has been effective. Patient denies any pain at this time and he is doing much better.     82 y.o. male presents for follow-up of neuropathic pain in bilateral foot.  He has been taking gabapentin 300 mg daily at night.  He says it has been helping a lot very effective.  He is having decreased pain he is sleeping better.  He denies any pain at this time.  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   Ischemic cardiomyopathy 10/24/2016   Overview:  Ejection fraction 45% in spring 2018   Myocardial infarction [facility])    Near syncope    Nonsustained ventricular tachycardia (HCC) 07/17/2018   OSA (obstructive sleep apnea)    OSA on CPAP 11/14/2015   CPAP 13    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  Onychomycosis of the hallux nails bilateral with thickening dystrophy discoloration  Vascular:  Dorsalis Pedis artery and Posterior Tibial artery pedal pulses are 2/4 bilateral.  Capillary fill time < 3 sec to all digits.   Neruologic: Grossly  diminished to light touch in the toes bilaterally.  Decreased subjective sensation of burning tingling pins-and-needles type pain to the level of the midfoot bilateral foot  Musculoskeletal: Osseous prominence about the midfoot consistent with osteoarthritis  Gait: Unassisted, Nonantalgic.   No images are attached to the encounter.  Radiographs:  Date: 10/02/2022 XR both feet Weightbearing AP/Lateral/Oblique   Findings: degenerative changes of the midtarsal and tarsometatarsal joint no obvious fracture identified Assessment:   1. Neuropathic pain   2. Arthritis of both midfeet       Plan:  Patient was evaluated and treated and all questions answered.  # Neuropathic pain  # Midfoot arthritis -Discussed with patient his symptoms do sound consistent with neuropathic pain -I recommend we continue with gabapentin 300 mg once daily at night , patient will call as needed for refill -Discussed risk benefits and possible side effects associate with gabapentin including drowsiness -Continue to monitor for progression however he seems well-controlled with the current dosage of gabapentin  # Onychomycosis -Recommend Penlac topical antifungal solution 8% apply once daily to all nails  Return in about 6 months (around 05/15/2023) for Follow-up nerve pain and nail fungus.          Corinna Gab, DPM [facility] / [facility]

## 2022-11-14 DIAGNOSIS — G4733 Obstructive sleep apnea (adult) (pediatric): Secondary | ICD-10-CM | POA: Diagnosis not present

## 2022-11-23 ENCOUNTER — Other Ambulatory Visit: Payer: Self-pay | Admitting: Cardiology

## 2022-11-30 DIAGNOSIS — G629 Polyneuropathy, unspecified: Secondary | ICD-10-CM | POA: Diagnosis not present

## 2022-11-30 DIAGNOSIS — R42 Dizziness and giddiness: Secondary | ICD-10-CM | POA: Diagnosis not present

## 2022-11-30 DIAGNOSIS — I700 Atherosclerosis of aorta: Secondary | ICD-10-CM | POA: Diagnosis not present

## 2022-11-30 DIAGNOSIS — Z6828 Body mass index (BMI) 28.0-28.9, adult: Secondary | ICD-10-CM | POA: Diagnosis not present

## 2022-12-10 DIAGNOSIS — G629 Polyneuropathy, unspecified: Secondary | ICD-10-CM | POA: Diagnosis not present

## 2022-12-10 DIAGNOSIS — I700 Atherosclerosis of aorta: Secondary | ICD-10-CM | POA: Diagnosis not present

## 2022-12-17 DIAGNOSIS — E785 Hyperlipidemia, unspecified: Secondary | ICD-10-CM | POA: Diagnosis not present

## 2022-12-17 DIAGNOSIS — E039 Hypothyroidism, unspecified: Secondary | ICD-10-CM | POA: Diagnosis not present

## 2022-12-17 DIAGNOSIS — I10 Essential (primary) hypertension: Secondary | ICD-10-CM | POA: Diagnosis not present

## 2022-12-20 ENCOUNTER — Telehealth: Payer: Self-pay | Admitting: Cardiology

## 2022-12-20 NOTE — Telephone Encounter (Signed)
Pt c/o medication issue:  1. Name of Medication: apixaban (ELIQUIS) 5 MG TABS tablet   2. How are you currently taking this medication (dosage and times per day)?    3. Are you having a reaction (difficulty breathing--STAT)? no  4. What is your medication issue? Patient is in the donut hole with insurance. Unable to afford medication. Calling to see what the options are. Please advise

## 2022-12-20 NOTE — Telephone Encounter (Signed)
Wife notes they just picked up 3 month supply for $400.  States they were able to afford this time because of some extra income, but that won't happen again.  She will reach out for samples when they get low and determine best course at that time.

## 2022-12-24 DIAGNOSIS — E039 Hypothyroidism, unspecified: Secondary | ICD-10-CM | POA: Diagnosis not present

## 2022-12-24 DIAGNOSIS — I10 Essential (primary) hypertension: Secondary | ICD-10-CM | POA: Diagnosis not present

## 2022-12-24 DIAGNOSIS — I700 Atherosclerosis of aorta: Secondary | ICD-10-CM | POA: Diagnosis not present

## 2022-12-24 DIAGNOSIS — E785 Hyperlipidemia, unspecified: Secondary | ICD-10-CM | POA: Diagnosis not present

## 2022-12-24 DIAGNOSIS — C44629 Squamous cell carcinoma of skin of left upper limb, including shoulder: Secondary | ICD-10-CM | POA: Diagnosis not present

## 2022-12-24 DIAGNOSIS — Z6828 Body mass index (BMI) 28.0-28.9, adult: Secondary | ICD-10-CM | POA: Diagnosis not present

## 2022-12-24 DIAGNOSIS — L820 Inflamed seborrheic keratosis: Secondary | ICD-10-CM | POA: Diagnosis not present

## 2022-12-24 DIAGNOSIS — G4733 Obstructive sleep apnea (adult) (pediatric): Secondary | ICD-10-CM | POA: Diagnosis not present

## 2023-01-07 DIAGNOSIS — G4733 Obstructive sleep apnea (adult) (pediatric): Secondary | ICD-10-CM | POA: Diagnosis not present

## 2023-01-08 DIAGNOSIS — C44629 Squamous cell carcinoma of skin of left upper limb, including shoulder: Secondary | ICD-10-CM | POA: Diagnosis not present

## 2023-01-10 DIAGNOSIS — I10 Essential (primary) hypertension: Secondary | ICD-10-CM | POA: Diagnosis not present

## 2023-01-10 DIAGNOSIS — E039 Hypothyroidism, unspecified: Secondary | ICD-10-CM | POA: Diagnosis not present

## 2023-01-14 DIAGNOSIS — L02512 Cutaneous abscess of left hand: Secondary | ICD-10-CM | POA: Diagnosis not present

## 2023-02-04 DIAGNOSIS — G4733 Obstructive sleep apnea (adult) (pediatric): Secondary | ICD-10-CM | POA: Diagnosis not present

## 2023-02-06 DIAGNOSIS — Z20822 Contact with and (suspected) exposure to covid-19: Secondary | ICD-10-CM | POA: Diagnosis not present

## 2023-02-06 DIAGNOSIS — J189 Pneumonia, unspecified organism: Secondary | ICD-10-CM | POA: Diagnosis not present

## 2023-02-06 DIAGNOSIS — R059 Cough, unspecified: Secondary | ICD-10-CM | POA: Diagnosis not present

## 2023-02-07 DIAGNOSIS — Z131 Encounter for screening for diabetes mellitus: Secondary | ICD-10-CM | POA: Diagnosis not present

## 2023-02-10 DIAGNOSIS — E039 Hypothyroidism, unspecified: Secondary | ICD-10-CM | POA: Diagnosis not present

## 2023-02-10 DIAGNOSIS — I10 Essential (primary) hypertension: Secondary | ICD-10-CM | POA: Diagnosis not present

## 2023-02-11 ENCOUNTER — Other Ambulatory Visit: Payer: Self-pay | Admitting: Cardiology

## 2023-02-27 DIAGNOSIS — Z1212 Encounter for screening for malignant neoplasm of rectum: Secondary | ICD-10-CM | POA: Diagnosis not present

## 2023-02-28 ENCOUNTER — Ambulatory Visit: Payer: PPO | Attending: Cardiology | Admitting: Cardiology

## 2023-02-28 ENCOUNTER — Telehealth (HOSPITAL_COMMUNITY): Payer: Self-pay | Admitting: *Deleted

## 2023-02-28 ENCOUNTER — Encounter: Payer: Self-pay | Admitting: Cardiology

## 2023-02-28 VITALS — BP 132/64 | HR 48 | Ht 70.0 in | Wt 183.0 lb

## 2023-02-28 DIAGNOSIS — I25708 Atherosclerosis of coronary artery bypass graft(s), unspecified, with other forms of angina pectoris: Secondary | ICD-10-CM | POA: Diagnosis not present

## 2023-02-28 DIAGNOSIS — R0609 Other forms of dyspnea: Secondary | ICD-10-CM | POA: Diagnosis not present

## 2023-02-28 DIAGNOSIS — I4729 Other ventricular tachycardia: Secondary | ICD-10-CM

## 2023-02-28 DIAGNOSIS — E785 Hyperlipidemia, unspecified: Secondary | ICD-10-CM | POA: Diagnosis not present

## 2023-02-28 DIAGNOSIS — I480 Paroxysmal atrial fibrillation: Secondary | ICD-10-CM | POA: Diagnosis not present

## 2023-02-28 DIAGNOSIS — R079 Chest pain, unspecified: Secondary | ICD-10-CM

## 2023-02-28 DIAGNOSIS — Z951 Presence of aortocoronary bypass graft: Secondary | ICD-10-CM

## 2023-02-28 NOTE — Telephone Encounter (Signed)
Per DPR left detailed instructions for MPI on home answering machine.

## 2023-02-28 NOTE — Patient Instructions (Signed)
Medication Instructions:  Your physician recommends that you continue on your current medications as directed. Please refer to the Current Medication list given to you today.  *If you need a refill on your cardiac medications before your next appointment, please call your pharmacy*   Lab Work: None Ordered If you have labs (blood work) drawn today and your tests are completely normal, you will receive your results only by: MyChart Message (if you have MyChart) OR A paper copy in the mail If you have any lab test that is abnormal or we need to change your treatment, we will call you to review the results.   Testing/Procedures: Your physician has requested that you have a lexiscan myoview. For further information please visit [email or web address] Please follow instruction sheet, as given.  The test will take approximately 3 to 4 hours to complete; you may bring reading material.  If someone comes with you to your appointment, they will need to remain in the main lobby due to limited space in the testing area.    How to prepare for your Myocardial Perfusion Test: Do not eat or drink 3 hours prior to your test, except you may have water. Do not consume products containing caffeine (regular or decaffeinated) 12 hours prior to your test. (ex: coffee, chocolate, sodas, tea). Do bring a list of your current medications with you.  If not listed below, you may take your medications as normal. Do wear comfortable clothes (no dresses or overalls) and walking shoes, tennis shoes preferred (No heels or open toe shoes are allowed). Do NOT wear cologne, perfume, aftershave, or lotions (deodorant is allowed). If these instructions are not followed, your test will have to be rescheduled.     Your physician has requested that you have an echocardiogram. Echocardiography is a painless test that uses sound waves to create images of your heart. It provides your doctor with information about the size and shape  of your heart and how well your heart's chambers and valves are working. This procedure takes approximately one hour. There are no restrictions for this procedure. Please do NOT wear cologne, perfume, aftershave, or lotions (deodorant is allowed). Please arrive 15 minutes prior to your appointment time.    Follow-Up: At [facility], you and your health needs are our priority.  As part of our continuing mission to provide you with exceptional heart care, we have created designated Provider Care Teams.  These Care Teams include your primary Cardiologist (physician) and Advanced Practice Providers (APPs -  Physician Assistants and Nurse Practitioners) who all work together to provide you with the care you need, when you need it.  We recommend signing up for the patient portal called "MyChart".  Sign up information is provided on this After Visit Summary.  MyChart is used to connect with patients for Virtual Visits (Telemedicine).  Patients are able to view lab/test results, encounter notes, upcoming appointments, etc.  Non-urgent messages can be sent to your provider as well.   To learn more about what you can do with MyChart, go to [email or web address].    Your next appointment:   2 month(s)  The format for your next appointment:   In Person  Provider:   Gypsy Balsam, MD    Other Instructions NA

## 2023-02-28 NOTE — Progress Notes (Signed)
Cardiology Office Note:    Date:  02/28/2023   ID:  Shane Massey, DOB 11-Apr-1941, MRN [id number]  PCP:  Charlott Rakes, MD  Cardiologist:  Gypsy Balsam, MD    Referring MD: Charlott Rakes, MD   Chief Complaint  Patient presents with   Chest Pain   Shortness of Breath   Fatigue   Unsteady balance    History of Present Illness:    Shane Massey is a 82 y.o. male   with past medical history significant for coronary artery disease.  In March 2017 he required PTCA and stenting of the mid LAD he also have history of ischemic cardiomyopathy however ejection fraction determined in January 2020 was normal.  He been complaining of fatigue tiredness for long period time eventually we decided to pursue cardiac catheterization.  That showed 60 to 75% stenosis of left main, 80% stenosis of ramus intermedius, 50% stenosis first diagonal branch on 08/13/2019 he ended up having coronary bypass graft with LIMA to LAD, left radial to second obtuse marginal branch and RIMA to ramus intermedius at the same time he did have PFO closure.  Recovery was complicated by an episode of atrial fibrillation successfully managed with  He has been struggling for a while with shortness of breath he had difficulty finding out what the problem was extensive evaluation has been performed included echocardiogram as well as stress test which was negative eventually D-dimer has been performed which was high CT of the chest showed pulmonary emboli.  End up being admitted to the hospital put on anticoagulation and discharged home however after that he end up 1 more time in the emergency room with shortness of breath no new pulmonary emboli.  Recently he started having some strength symptoms monitor has been placed he was find to have nonsustained ventricular tachycardia but frequent episodes, he was seen by my colleague Dr. [name] in EP consultation.  He was put on mexiletine.  He comes today to months for follow-up and I  am getting some conflicting signal from him for several he tells me he is tired exhausted gets short of breath sometimes even chest pain from the opposite side he is showing the machine that he uses when he goes to the gym and he is trying to go to twice a week and doing well.  Overall he said his stamina is diminished.  He is also vivid hunter and he is looking forward to going hand at this year.  Shortness of breath quite easily happen with very low level of exercise, chest pain he described to have some heaviness and dizziness sometimes but that usually not related to exercise.  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   Ischemic cardiomyopathy 10/24/2016   Overview:  Ejection fraction 45% in spring 2018   Myocardial infarction [facility])    Near syncope    Nonsustained ventricular tachycardia (HCC) 07/17/2018   OSA (obstructive sleep apnea)    OSA on CPAP 11/14/2015   CPAP 13    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   Paroxysmal atrial fibrillation (  HCC) 09/09/2019   Pulmonary embolism (HCC)    Restless leg syndrome 11/30/2016   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    Past Surgical History:  Procedure Laterality Date   CATARACT EXTRACTION  2012   COLONOSCOPY  2013   CORONARY ARTERY BYPASS GRAFT N/A 08/13/2019   Procedure: CORONARY ARTERY BYPASS GRAFTING (CABG) TIMES 3.;  Surgeon: Linden Dolin, MD;  Location: [facility] OR;  Service: Open Heart Surgery;  Laterality: N/A;   CORONARY STENT PLACEMENT     heart stent Left 09/2015   HERNIA REPAIR  11/1977   LEFT HEART CATH AND CORONARY  ANGIOGRAPHY N/A 08/06/2019   Procedure: LEFT HEART CATH AND CORONARY ANGIOGRAPHY;  Surgeon: Lennette Bihari, MD;  Location: [facility] INVASIVE CV LAB;  Service: Cardiovascular;  Laterality: N/A;   RADIAL ARTERY HARVEST Left 08/13/2019   Procedure: OPEN RADIAL ARTERY HARVEST;  Surgeon: Linden Dolin, MD;  Location: [facility] OR;  Service: Open Heart Surgery;  Laterality: Left;   REPAIR OF PATENT FORAMEN OVALE N/A 08/13/2019   Procedure: CLOSURE OF PATENT FORAMEN OVALE;  Surgeon: Linden Dolin, MD;  Location: [facility] OR;  Service: Open Heart Surgery;  Laterality: N/A;   RTC repair  Right 11/03/2020   SURGERY FOR BOWEL ABSCESS  10/1959   TEE WITHOUT CARDIOVERSION N/A 08/13/2019   Procedure: TRANSESOPHAGEAL ECHOCARDIOGRAM (TEE);  Surgeon: Linden Dolin, MD;  Location[facility];  Service: Open Heart Surgery;  Laterality: N/A;    Current Medications: Current Meds  Medication Sig   apixaban (ELIQUIS) 5 MG TABS tablet Take 1 tablet (5 mg total) by mouth 2 (two) times daily.   Calcium Carb-Cholecalciferol (CALCIUM 600+D3 PO) Take 1 tablet by mouth daily at 12 noon.   clopidogrel (PLAVIX) 75 MG tablet Take 1 tablet by mouth once daily   dexlansoprazole (DEXILANT) 60 MG capsule Take 60 mg by mouth daily before breakfast.   furosemide (LASIX) 40 MG tablet TAKE 1 & 1/2 (ONE & ONE-HALF) TABLETS BY MOUTH ONCE DAILY (Patient taking differently: Take 60 mg by mouth daily.)   isosorbide mononitrate (IMDUR) 60 MG 24 hr tablet Take 1 tablet (60 mg total) by mouth daily.   levothyroxine (SYNTHROID) 75 MCG tablet Take 75 mcg by mouth daily before breakfast.   mexiletine (MEXITIL) 250 MG capsule Take 1 capsule (250 mg total) by mouth 2 (two) times daily.   Multiple Vitamin (MULTIVITAMIN WITH MINERALS) TABS tablet Take 1 tablet by mouth daily at 12 noon. Unknown strength per patient   nitroGLYCERIN (NITROSTAT) 0.4 MG SL tablet Place 1 tablet (0.4 mg total) under the tongue every 5 (five) minutes as needed for chest pain.    potassium chloride (KLOR-CON) 10 MEQ tablet Take 1 tablet by mouth once daily (Patient taking differently: Take 10 mEq by mouth daily.)   pramipexole (MIRAPEX) 1.5 MG tablet Take 1.5 mg by mouth at bedtime.    rosuvastatin (CRESTOR) 20 MG tablet Take 1 tablet by mouth once daily   TRELEGY ELLIPTA 100-62.5-25 MCG/ACT AEPB Inhale 1 puff into the lungs daily.   [DISCONTINUED] ciclopirox (PENLAC) 8 % solution Apply topically at bedtime. Apply over nail and surrounding skin. Apply daily over previous coat. After seven (7) days, may remove with alcohol and continue cycle. (Patient taking differently: Apply 1 Application topically at bedtime. Apply over nail and surrounding skin. Apply daily over previous coat. After seven (7) days, may remove with alcohol and continue cycle.)   [DISCONTINUED] gabapentin (NEURONTIN) 300 MG capsule Take 1 capsule (300 mg total) by  mouth at bedtime.     Allergies:   Patient has no known allergies.   Social History   Socioeconomic History   Marital status: Married    Spouse name: Not on file   Number of children: Not on file   Years of education: Not on file   Highest education level: Not on file  Occupational History   Not on file  Tobacco Use   Smoking status: Former    Current packs/day: 0.00    Average packs/day: 0.5 packs/day for 4.0 years (2.0 ttl pk-yrs)    Types: Cigarettes    Start date: 06/11/1954    Quit date: 06/11/1958    Years since quitting: 64.7   Smokeless tobacco: Former    Types: Chew    Quit date: 12/01/1958  Vaping Use   Vaping status: Never Used  Substance and Sexual Activity   Alcohol use: No    Alcohol/week: 0.0 standard drinks of alcohol   Drug use: No   Sexual activity: Not on file  Other Topics Concern   Not on file  Social History Narrative   Not on file   Social Determinants of Health   Financial Resource Strain: Not on file  Food Insecurity: Not on file  Transportation Needs: Not on file  Physical Activity: Not on file   Stress: Not on file  Social Connections: Not on file     Family History: The patient's family history includes Asthma in his brother and maternal grandmother; Heart attack in his brother; Heart disease in his brother; Stroke in his father. ROS:   Please see the history of present illness.    All 14 point review of systems negative except as described per history of present illness  EKGs/Labs/Other Studies Reviewed:         Recent Labs: No results found for requested labs within last 365 days.  Recent Lipid Panel    Component Value Date/Time   CHOL 128 04/12/2020 0913   TRIG 111 04/12/2020 0913   HDL 48 04/12/2020 0913   CHOLHDL 2.7 04/12/2020 0913   CHOLHDL 2.5 08/13/2019 0603   VLDL 25 08/13/2019 0603   LDLCALC 60 04/12/2020 0913    Physical Exam:    VS:  BP 132/64 (BP Location: Left Arm, Patient Position: Sitting)   Pulse (!) 48   Ht 5\' 10"  (1.778 m)   Wt 183 lb (83 kg)   SpO2 95%   BMI 26.26 kg/m     Wt Readings from Last 3 Encounters:  02/28/23 183 lb (83 kg)  10/19/22 185 lb 6.4 oz (84.1 kg)  09/28/22 182 lb (82.6 kg)     GEN:  Well nourished, well developed in no acute distress HEENT: Normal NECK: No JVD; No carotid bruits LYMPHATICS: No lymphadenopathy CARDIAC: RRR, no murmurs, no rubs, no gallops RESPIRATORY:  Clear to auscultation without rales, wheezing or rhonchi  ABDOMEN: Soft, non-tender, non-distended MUSCULOSKELETAL:  No edema; No deformity  SKIN: Warm and dry LOWER EXTREMITIES: no swelling NEUROLOGIC:  Alert and oriented x 3 PSYCHIATRIC:  Normal affect   ASSESSMENT:    1. Paroxysmal atrial fibrillation (HCC)   2. S/P CABG x 3   3. Dyspnea on exertion   4. Dyslipidemia   5. Atherosclerosis of coronary artery bypass graft(s), unspecified, with other forms of angina pectoris (HCC)   6. Nonsustained ventricular tachycardia (HCC)    PLAN:    In order of problems listed above:  Paroxysmal atrial fibrillation maintained sinus rhythm  anticoagulated which I  will continue. History of coronary to bypass graft.  He does have a constellation of some atypical symptoms I will schedule him to have Lexiscan to make sure he does not have any inducible ischemia. Dyspnea on exertion multifactorial suspect lung condition have some significant role here.  He is being followed by pulmonologist, will get echocardiogram will do a Lexiscan make sure there is no cardiac component of his symptomatology. Dyslipidemia I did review K PN which show me his LDL of 55 HDL 50 this is from 12/17/2022.  Will continue present management. PVCs nonsustained ventricular tachycardia he is EKG today showed frequent PVCs he is actually.  Of bigeminy which might contribute to his symptomatology.  Does and beats are coming from the left ventricle.  He has been followed by EP he is on mexiletine.  If Lexiscan and echocardiogram does not explain his symptomatology he may require another EKG trying to determine the burden of PVCs   Medication Adjustments/Labs and Tests Ordered: Current medicines are reviewed at length with the patient today.  Concerns regarding medicines are outlined above.  Orders Placed This Encounter  Procedures   EKG 12-Lead   Medication changes: No orders of the defined types were placed in this encounter.   Signed, Georgeanna Lea, MD, [facility] 02/28/2023 9:51 AM    [facility]

## 2023-03-05 ENCOUNTER — Ambulatory Visit: Payer: PPO | Attending: Cardiology

## 2023-03-05 DIAGNOSIS — R079 Chest pain, unspecified: Secondary | ICD-10-CM

## 2023-03-05 MED ORDER — TECHNETIUM TC 99M TETROFOSMIN IV KIT
30.9000 | PACK | Freq: Once | INTRAVENOUS | Status: AC | PRN
  Administered 2023-03-05: 30.9 via INTRAVENOUS

## 2023-03-05 MED ORDER — TECHNETIUM TC 99M TETROFOSMIN IV KIT
9.9000 | PACK | Freq: Once | INTRAVENOUS | Status: AC | PRN
  Administered 2023-03-05: 9.9 via INTRAVENOUS

## 2023-03-05 MED ORDER — REGADENOSON 0.4 MG/5ML IV SOLN
0.4000 mg | Freq: Once | INTRAVENOUS | Status: AC
Start: 2023-03-05 — End: 2023-03-05
  Administered 2023-03-05: 0.4 mg via INTRAVENOUS

## 2023-03-06 LAB — MYOCARDIAL PERFUSION IMAGING
LV dias vol: 191 mL (ref 62–150)
LV sys vol: 114 mL
Nuc Stress EF: 40 %
Peak HR: 74 {beats}/min
Rest HR: 55 {beats}/min
Rest Nuclear Isotope Dose: 9.9 mCi
SDS: 0
SRS: 3
SSS: 3
ST Depression (mm): 0 mm
Stress Nuclear Isotope Dose: 30.9 mCi
TID: 1.05

## 2023-03-08 ENCOUNTER — Telehealth: Payer: Self-pay | Admitting: Cardiology

## 2023-03-08 DIAGNOSIS — I480 Paroxysmal atrial fibrillation: Secondary | ICD-10-CM

## 2023-03-08 MED ORDER — APIXABAN 5 MG PO TABS: 5.0000 mg | ORAL_TABLET | Freq: Two times a day (BID) | ORAL | 0 refills | Status: DC

## 2023-03-08 MED ORDER — ISOSORBIDE MONONITRATE ER 120 MG PO TB24: 120.0000 mg | ORAL_TABLET | Freq: Every day | ORAL | Status: DC

## 2023-03-08 NOTE — Telephone Encounter (Signed)
Shane Lea, MD     Stress test showed mild abnormality.  Please increase dose of Imdur from 60 to 120 mg daily   Spoke with pt wife, aware of the nuclear results. Aware to increase isosorbide to 120 mg once daily. He will start with 1 and 1/2 of the 60 mg tablets for 2 days and then increase to 2 60 mg tablets once daily. This will hopefully help with any headache.  Patient reports a feeling in his chest, it is there all the time. It has been there 2-3 weeks. He also reports the discomfort moves down into the top of his stomach. Aware does not sound like heart pain and he can take tylenol to see if that helps.  Eliquis samples placed for patient pick up.

## 2023-03-08 NOTE — Telephone Encounter (Signed)
Pt's spouse is requesting a callback regarding pt's results. Please advise

## 2023-03-12 DIAGNOSIS — E039 Hypothyroidism, unspecified: Secondary | ICD-10-CM | POA: Diagnosis not present

## 2023-03-12 DIAGNOSIS — I10 Essential (primary) hypertension: Secondary | ICD-10-CM | POA: Diagnosis not present

## 2023-03-13 ENCOUNTER — Telehealth: Payer: Self-pay

## 2023-03-13 NOTE — Telephone Encounter (Signed)
-----   Message from Gypsy Balsam sent at 03/08/2023  9:12 AM EDT ----- Stress test showed mild abnormality.  Please increase dose of Imdur from 60 to 120 mg daily

## 2023-03-13 NOTE — Telephone Encounter (Signed)
MyChart message

## 2023-03-21 ENCOUNTER — Ambulatory Visit: Payer: PPO | Attending: Cardiology

## 2023-03-21 DIAGNOSIS — R0609 Other forms of dyspnea: Secondary | ICD-10-CM

## 2023-03-22 LAB — ECHOCARDIOGRAM COMPLETE
Area-P 1/2: 2.45 cm2
S' Lateral: 4.6 cm

## 2023-04-03 ENCOUNTER — Telehealth: Payer: Self-pay

## 2023-04-03 ENCOUNTER — Other Ambulatory Visit: Payer: Self-pay | Admitting: Cardiology

## 2023-04-03 DIAGNOSIS — I480 Paroxysmal atrial fibrillation: Secondary | ICD-10-CM

## 2023-04-03 NOTE — Telephone Encounter (Signed)
Patient notified of results and verbalized understanding.  

## 2023-04-03 NOTE — Telephone Encounter (Signed)
-----   Message from Gypsy Balsam sent at 04/01/2023 12:07 PM EDT ----- Echocardiogram showed unchanged ejection fraction, everything looks great

## 2023-04-03 NOTE — Telephone Encounter (Signed)
Prescription refill request for Eliquis received. Indication: Afib  Last office visit: 02/28/23 [facility])  Scr: 1.02 (12/18/22 via [facility]) Age: 82 Weight: 83kg  Appropriate dose. Refill sent.

## 2023-04-10 ENCOUNTER — Telehealth: Payer: Self-pay

## 2023-04-10 DIAGNOSIS — Z23 Encounter for immunization: Secondary | ICD-10-CM | POA: Diagnosis not present

## 2023-04-10 NOTE — Telephone Encounter (Signed)
Patient informed, application approved until 06/11/2023.  [id number]  Phone number provided to set up delivery

## 2023-04-12 DIAGNOSIS — E039 Hypothyroidism, unspecified: Secondary | ICD-10-CM | POA: Diagnosis not present

## 2023-04-12 DIAGNOSIS — I10 Essential (primary) hypertension: Secondary | ICD-10-CM | POA: Diagnosis not present

## 2023-04-15 ENCOUNTER — Other Ambulatory Visit: Payer: Self-pay | Admitting: Cardiology

## 2023-04-19 ENCOUNTER — Ambulatory Visit (HOSPITAL_COMMUNITY)
Admission: RE | Admit: 2023-04-19 | Discharge: 2023-04-19 | Disposition: A | Discharge status: Home / Self Care | Payer: PPO | Source: Ambulatory Visit | Attending: Pulmonary Disease | Admitting: Pulmonary Disease

## 2023-04-19 DIAGNOSIS — J849 Interstitial pulmonary disease, unspecified: Secondary | ICD-10-CM | POA: Insufficient documentation

## 2023-04-19 DIAGNOSIS — R590 Localized enlarged lymph nodes: Secondary | ICD-10-CM | POA: Diagnosis not present

## 2023-04-19 DIAGNOSIS — R918 Other nonspecific abnormal finding of lung field: Secondary | ICD-10-CM | POA: Diagnosis not present

## 2023-04-19 DIAGNOSIS — J8410 Pulmonary fibrosis, unspecified: Secondary | ICD-10-CM | POA: Diagnosis not present

## 2023-04-22 ENCOUNTER — Ambulatory Visit: Payer: PPO | Attending: Cardiology | Admitting: Cardiology

## 2023-04-22 ENCOUNTER — Encounter: Payer: Self-pay | Admitting: Cardiology

## 2023-04-22 VITALS — BP 136/70 | HR 60 | Ht 70.0 in | Wt 183.0 lb

## 2023-04-22 DIAGNOSIS — I209 Angina pectoris, unspecified: Secondary | ICD-10-CM

## 2023-04-22 MED ORDER — LOSARTAN POTASSIUM 25 MG PO TABS: 25.0000 mg | ORAL_TABLET | Freq: Every day | ORAL | 3 refills | Status: DC

## 2023-04-22 NOTE — H&P (View-Only) (Signed)
 Cardiology Office Note:    Date:  04/22/2023   ID:  Shane Massey, DOB Feb 09, 1941, MRN [id number]  PCP:  Charlott Rakes, MD  Cardiologist:  Gypsy Balsam, MD    Referring MD: Charlott Rakes, MD   Chief Complaint  Patient presents with   Shortness of Breath        Fatigue    Both ongoing for 1-2 months    History of Present Illness:    Shane Massey is a 82 y.o. male   with past medical history significant for coronary artery disease.  In March 2017 he required PTCA and stenting of the mid LAD he also have history of ischemic cardiomyopathy however ejection fraction determined in January 2020 was normal.  He been complaining of fatigue tiredness for long period time eventually we decided to pursue cardiac catheterization.  That showed 60 to 75% stenosis of left main, 80% stenosis of ramus intermedius, 50% stenosis first diagonal branch on 08/13/2019 he ended up having coronary bypass graft with LIMA to LAD, left radial to second obtuse marginal branch and RIMA to ramus intermedius at the same time he did have PFO closure.  Recovery was complicated by an episode of atrial fibrillation successfully managed with  He has been struggling for a while with shortness of breath he had difficulty finding out what the problem was extensive evaluation has been performed included echocardiogram as well as stress test which was negative eventually D-dimer has been performed which was high CT of the chest showed pulmonary emboli.  End up being admitted to the hospital put on anticoagulation and discharged home however after that he end up 1 more time in the emergency room with shortness of breath no new pulmonary emboli.  Recently he started having some strength symptoms monitor has been placed he was find to have nonsustained ventricular tachycardia but frequent episodes, he was seen by my colleague Dr. [name] in EP consultation.  He was put on mexiletine.  Comes today to months for follow-up.   He did have a stress test done which showed area of ischemia involving anterior lateral wall suggesting potential diagonal branch disease.  He still not feeling well.  He denies having any typical chest pain tightness squeezing pressure burning chest but he have to modified his lifestyle significantly.  He love hunting he always was able to walk the distance and now he have to ride his 4 wheeler.  Just profound exertion fatigue tiredness some chest pain.  I tried to increase dose of Imdur however he did not make any difference.  His ejection fraction is mildly diminished 45%.  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   Ischemic cardiomyopathy 10/24/2016   Overview:  Ejection fraction 45% in spring 2018   Myocardial infarction [facility])    Near syncope    Nonsustained ventricular tachycardia (HCC) 07/17/2018   OSA (obstructive sleep apnea)    OSA on CPAP 11/14/2015   CPAP 13    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   Paroxysmal atrial fibrillation (HCC) 09/09/2019   Pulmonary embolism (HCC)  Restless leg syndrome 11/30/2016   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    Past Surgical History:  Procedure Laterality Date   CATARACT EXTRACTION  2012   COLONOSCOPY  2013   CORONARY ARTERY BYPASS GRAFT N/A 08/13/2019   Procedure: CORONARY ARTERY BYPASS GRAFTING (CABG) TIMES 3.;  Surgeon: Linden Dolin, MD;  Location: [facility] OR;  Service: Open Heart Surgery;  Laterality: N/A;   CORONARY STENT PLACEMENT     heart stent Left 09/2015   HERNIA REPAIR  11/1977   LEFT HEART CATH AND CORONARY  ANGIOGRAPHY N/A 08/06/2019   Procedure: LEFT HEART CATH AND CORONARY ANGIOGRAPHY;  Surgeon: Lennette Bihari, MD;  [facility]: [facility] INVASIVE CV LAB;  Service: Cardiovascular;  Laterality: N/A;   RADIAL ARTERY HARVEST Left 08/13/2019   Procedure: OPEN RADIAL ARTERY HARVEST;  Surgeon: Linden Dolin, MD;  Location: [facility] OR;  Service: Open Heart Surgery;  Laterality: Left;   REPAIR OF PATENT FORAMEN OVALE N/A 08/13/2019   Procedure: CLOSURE OF PATENT FORAMEN OVALE;  Surgeon: Linden Dolin, MD;  Location: [facility] OR;  Service: Open Heart Surgery;  Laterality: N/A;   RTC repair  Right 11/03/2020   SURGERY FOR BOWEL ABSCESS  10/1959   TEE WITHOUT CARDIOVERSION N/A 08/13/2019   Procedure: TRANSESOPHAGEAL ECHOCARDIOGRAM (TEE);  Surgeon: Linden Dolin, MD;  Location[facility] OR;  Service: Open Heart Surgery;  Laterality: N/A;    Current Medications: Current Meds  Medication Sig   apixaban (ELIQUIS) 5 MG TABS tablet Take 1 tablet by mouth twice daily   Calcium Carb-Cholecalciferol (CALCIUM 600+D3 PO) Take 1 tablet by mouth daily at 12 noon.   clopidogrel (PLAVIX) 75 MG tablet Take 1 tablet by mouth once daily   dexlansoprazole (DEXILANT) 60 MG capsule Take 60 mg by mouth daily before breakfast.   furosemide (LASIX) 40 MG tablet TAKE 1 & 1/2 (ONE & ONE-HALF) TABLETS BY MOUTH ONCE DAILY (Patient taking differently: Take 60 mg by mouth daily.)   isosorbide mononitrate (IMDUR) 120 MG 24 hr tablet Take 1 tablet (120 mg total) by mouth daily.   levothyroxine (SYNTHROID) 75 MCG tablet Take 75 mcg by mouth daily before breakfast.   mexiletine (MEXITIL) 250 MG capsule Take 1 capsule (250 mg total) by mouth 2 (two) times daily.   Multiple Vitamin (MULTIVITAMIN WITH MINERALS) TABS tablet Take 1 tablet by mouth daily at 12 noon. Unknown strength per patient   nitroGLYCERIN (NITROSTAT) 0.4 MG SL tablet Place 1 tablet (0.4 mg total) under the tongue every 5 (five) minutes as needed for chest pain.   potassium chloride  (KLOR-CON) 10 MEQ tablet Take 1 tablet by mouth once daily (Patient taking differently: Take 10 mEq by mouth daily.)   pramipexole (MIRAPEX) 1.5 MG tablet Take 1.5 mg by mouth at bedtime.    rosuvastatin (CRESTOR) 20 MG tablet Take 1 tablet by mouth once daily   TRELEGY ELLIPTA 100-62.5-25 MCG/ACT AEPB Inhale 1 puff into the lungs daily.     Allergies:   Patient has no known allergies.   Social History   Socioeconomic History   Marital status: Married    Spouse name: Not on file   Number of children: Not on file   Years of education: Not on file   Highest education level: Not on file  Occupational History   Not on file  Tobacco Use   Smoking status: Former    Current packs/day: 0.00    Average packs/day: 0.5 packs/day for 4.0 years (2.0 ttl pk-yrs)  Types: Cigarettes    Start date: 06/11/1954    Quit date: 06/11/1958    Years since quitting: 64.9   Smokeless tobacco: Former    Types: Chew    Quit date: 12/01/1958  Vaping Use   Vaping status: Never Used  Substance and Sexual Activity   Alcohol use: No    Alcohol/week: 0.0 standard drinks of alcohol   Drug use: No   Sexual activity: Not on file  Other Topics Concern   Not on file  Social History Narrative   Not on file   Social Determinants of Health   Financial Resource Strain: Not on file  Food Insecurity: Not on file  Transportation Needs: Not on file  Physical Activity: Not on file  Stress: Not on file  Social Connections: Not on file     Family History: The patient's family history includes Asthma in his brother and maternal grandmother; Heart attack in his brother; Heart disease in his brother; Stroke in his father. ROS:   Please see the history of present illness.    All 14 point review of systems negative except as described per history of present illness  EKGs/Labs/Other Studies Reviewed:         Recent Labs: No results found for requested labs within last 365 days.  Recent Lipid Panel     Component Value Date/Time   CHOL 128 04/12/2020 0913   TRIG 111 04/12/2020 0913   HDL 48 04/12/2020 0913   CHOLHDL 2.7 04/12/2020 0913   CHOLHDL 2.5 08/13/2019 0603   VLDL 25 08/13/2019 0603   LDLCALC 60 04/12/2020 0913    Physical Exam:    VS:  BP 136/70 (BP Location: Left Arm, Patient Position: Sitting)   Pulse 60   Ht 5\' 10"  (1.778 m)   Wt 183 lb (83 kg)   SpO2 94%   BMI 26.26 kg/m     Wt Readings from Last 3 Encounters:  04/22/23 183 lb (83 kg)  03/05/23 183 lb (83 kg)  02/28/23 183 lb (83 kg)     GEN:  Well nourished, well developed in no acute distress HEENT: Normal NECK: No JVD; No carotid bruits LYMPHATICS: No lymphadenopathy CARDIAC: RRR, no murmurs, no rubs, no gallops RESPIRATORY:  Clear to auscultation without rales, wheezing or rhonchi  ABDOMEN: Soft, non-tender, non-distended MUSCULOSKELETAL:  No edema; No deformity  SKIN: Warm and dry LOWER EXTREMITIES: no swelling NEUROLOGIC:  Alert and oriented x 3 PSYCHIATRIC:  Normal affect   ASSESSMENT:    1. Angina pectoris (HCC)    PLAN:    In order of problems listed above:  Coronary artery disease.  He does have some atypical symptoms with some chest pain as well as stress test show ischemia involving anterior lateral wall.  We had a long discussion about what to do with the situation.  He is quite desperate he said something need to be done and he is willing to proceed with cardiac catheterization.  Procedure has been explained to him including all risk benefits as well as alternative we will proceed. Diminished left ventricle ejection fraction, echocardiogram showed ejection fraction 45 to 50%.  Will put him on small dose of losartan with intention to switch him to [facility]. Dyslipidemia he is on statin with LDL 55 HDL 50 this is from summer of this year. Frequent ventricular ectopy he is on mexiletine.  That being followed by our EP team. History of PE on Eliquis.   Medication Adjustments/Labs and  Tests Ordered: Current medicines are  reviewed at length with the patient today.  Concerns regarding medicines are outlined above.  Orders Placed This Encounter  Procedures   EKG 12-Lead   Medication changes: No orders of the defined types were placed in this encounter.   Signed, Georgeanna Lea, MD, [facility] 04/22/2023 10:40 AM    [facility]

## 2023-04-22 NOTE — Patient Instructions (Addendum)
Medication Instructions:   HOLD: Eliquis 2 days prior to Procedure  HOLD: Lasix day of procedure         *If you need a refill on your cardiac medications before your next appointment, please call your pharmacy*   Lab Work: CBC- BMP- today  If you have labs (blood work) drawn today and your tests are completely normal, you will receive your results only by: MyChart Message (if you have MyChart) OR A paper copy in the mail If you have any lab test that is abnormal or we need to change your treatment, we will call you to review the results.   Testing/Procedures:  [place] [facility] DEPT OF [facility] AT [place] [address] [place] [address] Dept: [phone] Loc: [phone]  CREEK SWADER  04/22/2023  You are scheduled for a Cardiac Catheterization on Tuesday, November 19 with Dr. Alverda Skeans.  1. Please arrive at the [facility] (Main Entrance A) at [facility]: [address] [place], [place] [address] at 7:00 AM (This time is 2 hour(s) before your procedure to ensure your preparation). Free valet parking service is available. You will check in at ADMITTING. The support person will be asked to wait in the waiting room.  It is OK to have someone drop you off and come back when you are ready to be discharged.    Special note: Every effort is made to have your procedure done on time. Please understand that emergencies sometimes delay scheduled procedures.  2. Diet: Do not eat solid foods after midnight.  The patient may have clear liquids until 5am upon the day of the procedure.  3. Labs: You will need to have blood drawn on Monday, November 11 at [employer] [employer]: [address] [place], [place] . You do not need to be fasting.  4. Medication instructions in preparation for your procedure:   Contrast Allergy: No    Stop taking Eliquis (Apixiban) on Sunday, November 17.  Stop taking, Lasix (Furosemide)   Tuesday, November 19,       5. Plan to go home the same day, you will only stay overnight if medically necessary. 6. Bring a current list of your medications and current insurance cards. 7. You MUST have a responsible person to drive you home. 8. Someone MUST be with you the first 24 hours after you arrive home or your discharge will be delayed. 9. Please wear clothes that are easy to get on and off and wear slip-on shoes.  Thank you for allowing [place] to care for you!   --  Invasive Cardiovascular services    Follow-Up: At [facility], you and your health needs are our priority.  As part of our continuing mission to provide you with exceptional heart care, we have created designated Provider Care Teams.  These Care Teams include your primary Cardiologist (physician) and Advanced Practice Providers (APPs -  Physician Assistants and Nurse Practitioners) who all work together to provide you with the care you need, when you need it.  We recommend signing up for the patient portal called "MyChart".  Sign up information is provided on this After Visit Summary.  MyChart is used to connect with patients for Virtual Visits (Telemedicine).  Patients are able to view lab/test results, encounter notes, upcoming appointments, etc.  Non-urgent messages can be sent to your provider as well.   To learn more about what you can do with MyChart, go to  [email or web address].    Your next appointment:   2 month(s)  The format for your next appointment:   In Person  Provider:   Gypsy Balsam, MD   Other Instructions  Coronary Angiogram With Stent Coronary angiogram with stent placement is a procedure to widen or open a narrow blood vessel of the heart (coronary artery). Arteries may become blocked by cholesterol buildup (plaques) in the lining of the artery wall. When a coronary artery becomes partially blocked, blood flow to that area decreases. This may lead to chest pain or a heart  attack (myocardial infarction). A stent is a small piece of metal that looks like mesh or spring. Stent placement may be done as treatment after a heart attack, or to prevent a heart attack if a blocked artery is found by a coronary angiogram. Let your health care provider know about: Any allergies you have, including allergies to medicines or contrast dye. All medicines you are taking, including vitamins, herbs, eye drops, creams, and over-the-counter medicines. Any problems you or family members have had with anesthetic medicines. Any blood disorders you have. Any surgeries you have had. Any medical conditions you have, including kidney problems or kidney failure. Whether you are pregnant or may be pregnant. Whether you are breastfeeding. What are the risks? Generally, this is a safe procedure. However, serious problems may occur, including: Damage to nearby structures or organs, such as the heart, blood vessels, or kidneys. A return of blockage. Bleeding, infection, or bruising at the insertion site. A collection of blood under the skin (hematoma) at the insertion site. A blood clot in another part of the body. Allergic reaction to medicines or dyes. Bleeding into the abdomen (retroperitoneal bleeding). Stroke (rare). Heart attack (rare). What happens before the procedure? Staying hydrated Follow instructions from your health care provider about hydration, which may include: Up to 2 hours before the procedure - you may continue to drink clear liquids, such as water, clear fruit juice, black coffee, and plain tea.    Eating and drinking restrictions Follow instructions from your health care provider about eating and drinking, which may include: 8 hours before the procedure - stop eating heavy meals or foods, such as meat, fried foods, or fatty foods. 6 hours before the procedure - stop eating light meals or foods, such as toast or cereal. 2 hours before the procedure - stop drinking  clear liquids. Medicines Ask your health care provider about: Changing or stopping your regular medicines. This is especially important if you are taking diabetes medicines or blood thinners. Taking medicines such as aspirin and ibuprofen. These medicines can thin your blood. Do not take these medicines unless your health care provider tells you to take them. Generally, aspirin is recommended before a thin tube, called a catheter, is passed through a blood vessel and inserted into the heart (cardiac catheterization). Taking over-the-counter medicines, vitamins, herbs, and supplements. General instructions Do not use any products that contain nicotine or tobacco for at least 4 weeks before the procedure. These products include cigarettes, e-cigarettes, and chewing tobacco. If you need help quitting, ask your health care provider. Plan to have someone take you home from the hospital or clinic. If you will be going home right after the procedure, plan to have someone with you for 24 hours. You may have tests and imaging procedures. Ask your health care provider: How your insertion site will be marked. Ask which artery will be used for the procedure. What steps will be taken to help  prevent infection. These may include: Removing hair at the insertion site. Washing skin with a germ-killing soap. Taking antibiotic medicine. What happens during the procedure? An IV will be inserted into one of your veins. Electrodes may be placed on your chest to monitor your heart rate during the procedure. You will be given one or more of the following: A medicine to help you relax (sedative). A medicine to numb the area (local anesthetic) for catheter insertion. A small incision will be made for catheter insertion. The catheter will be inserted into an artery using a guide wire. The location may be in your groin, your wrist, or the fold of your arm (near your elbow). An X-ray procedure (fluoroscopy) will be  used to help guide the catheter to the opening of the heart arteries. A dye will be injected into the catheter. X-rays will be taken. The dye helps to show where any narrowing or blockages are located in the arteries. Tell your health care provider if you have chest pain or trouble breathing. A tiny wire will be guided to the blocked spot, and a balloon will be inflated to make the artery wider. The stent will be expanded to crush the plaques into the wall of the vessel. The stent will hold the area open and improve the blood flow. Most stents have a drug coating to reduce the risk of the stent narrowing over time. The artery may be made wider using a drill, laser, or other tools that remove plaques. The catheter will be removed when the blood flow improves. The stent will stay where it was placed, and the lining of the artery will grow over it. A bandage (dressing) will be placed on the insertion site. Pressure will be applied to stop bleeding. The IV will be removed. This procedure may vary among health care providers and hospitals.    What happens after the procedure? Your blood pressure, heart rate, breathing rate, and blood oxygen level will be monitored until you leave the hospital or clinic. If the procedure is done through the leg, you will lie flat in bed for a few hours or for as long as told by your health care provider. You will be instructed not to bend or cross your legs. The insertion site and the pulse in your foot or wrist will be checked often. You may have more blood tests, X-rays, and a test that records the electrical activity of your heart (electrocardiogram, or ECG). Do not drive for 24 hours if you were given a sedative during your procedure. Summary Coronary angiogram with stent placement is a procedure to widen or open a narrowed coronary artery. This is done to treat heart problems. Before the procedure, let your health care provider know about all the medical conditions  and surgeries you have or have had. This is a safe procedure. However, some problems may occur, including damage to nearby structures or organs, bleeding, blood clots, or allergies. Follow your health care provider's instructions about eating, drinking, medicines, and other lifestyle changes, such as quitting tobacco use before the procedure. This information is not intended to replace advice given to you by your health care provider. Make sure you discuss any questions you have with your health care provider. Document Revised: 12/17/2018 Document Reviewed: 12/17/2018 Elsevier Patient Education  2021 [employer].

## 2023-04-22 NOTE — Progress Notes (Unsigned)
Cardiology Office Note:    Date:  04/22/2023   ID:  Shane Massey, DOB Feb 09, 1941, MRN [id number]  PCP:  Charlott Rakes, MD  Cardiologist:  Gypsy Balsam, MD    Referring MD: Charlott Rakes, MD   Chief Complaint  Patient presents with   Shortness of Breath        Fatigue    Both ongoing for 1-2 months    History of Present Illness:    Shane Massey is a 82 y.o. male   with past medical history significant for coronary artery disease.  In March 2017 he required PTCA and stenting of the mid LAD he also have history of ischemic cardiomyopathy however ejection fraction determined in January 2020 was normal.  He been complaining of fatigue tiredness for long period time eventually we decided to pursue cardiac catheterization.  That showed 60 to 75% stenosis of left main, 80% stenosis of ramus intermedius, 50% stenosis first diagonal branch on 08/13/2019 he ended up having coronary bypass graft with LIMA to LAD, left radial to second obtuse marginal branch and RIMA to ramus intermedius at the same time he did have PFO closure.  Recovery was complicated by an episode of atrial fibrillation successfully managed with  He has been struggling for a while with shortness of breath he had difficulty finding out what the problem was extensive evaluation has been performed included echocardiogram as well as stress test which was negative eventually D-dimer has been performed which was high CT of the chest showed pulmonary emboli.  End up being admitted to the hospital put on anticoagulation and discharged home however after that he end up 1 more time in the emergency room with shortness of breath no new pulmonary emboli.  Recently he started having some strength symptoms monitor has been placed he was find to have nonsustained ventricular tachycardia but frequent episodes, he was seen by my colleague Dr. [name] in EP consultation.  He was put on mexiletine.  Comes today to months for follow-up.   He did have a stress test done which showed area of ischemia involving anterior lateral wall suggesting potential diagonal branch disease.  He still not feeling well.  He denies having any typical chest pain tightness squeezing pressure burning chest but he have to modified his lifestyle significantly.  He love hunting he always was able to walk the distance and now he have to ride his 4 wheeler.  Just profound exertion fatigue tiredness some chest pain.  I tried to increase dose of Imdur however he did not make any difference.  His ejection fraction is mildly diminished 45%.  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   Ischemic cardiomyopathy 10/24/2016   Overview:  Ejection fraction 45% in spring 2018   Myocardial infarction [facility])    Near syncope    Nonsustained ventricular tachycardia (HCC) 07/17/2018   OSA (obstructive sleep apnea)    OSA on CPAP 11/14/2015   CPAP 13    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   Paroxysmal atrial fibrillation (HCC) 09/09/2019   Pulmonary embolism (HCC)  Restless leg syndrome 11/30/2016   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    Past Surgical History:  Procedure Laterality Date   CATARACT EXTRACTION  2012   COLONOSCOPY  2013   CORONARY ARTERY BYPASS GRAFT N/A 08/13/2019   Procedure: CORONARY ARTERY BYPASS GRAFTING (CABG) TIMES 3.;  Surgeon: Linden Dolin, MD;  Location: [facility] OR;  Service: Open Heart Surgery;  Laterality: N/A;   CORONARY STENT PLACEMENT     heart stent Left 09/2015   HERNIA REPAIR  11/1977   LEFT HEART CATH AND CORONARY  ANGIOGRAPHY N/A 08/06/2019   Procedure: LEFT HEART CATH AND CORONARY ANGIOGRAPHY;  Surgeon: Lennette Bihari, MD;  Location: [facility] INVASIVE CV LAB;  Service: Cardiovascular;  Laterality: N/A;   RADIAL ARTERY HARVEST Left 08/13/2019   Procedure: OPEN RADIAL ARTERY HARVEST;  Surgeon: Linden Dolin, MD;  Location: [facility] OR;  Service: Open Heart Surgery;  Laterality: Left;   REPAIR OF PATENT FORAMEN OVALE N/A 08/13/2019   Procedure: CLOSURE OF PATENT FORAMEN OVALE;  Surgeon: Linden Dolin, MD;  Location: [facility] OR;  Service: Open Heart Surgery;  Laterality: N/A;   RTC repair  Right 11/03/2020   SURGERY FOR BOWEL ABSCESS  10/1959   TEE WITHOUT CARDIOVERSION N/A 08/13/2019   Procedure: TRANSESOPHAGEAL ECHOCARDIOGRAM (TEE);  Surgeon: Linden Dolin, MD;  [facility];  Service: Open Heart Surgery;  Laterality: N/A;    Current Medications: Current Meds  Medication Sig   apixaban (ELIQUIS) 5 MG TABS tablet Take 1 tablet by mouth twice daily   Calcium Carb-Cholecalciferol (CALCIUM 600+D3 PO) Take 1 tablet by mouth daily at 12 noon.   clopidogrel (PLAVIX) 75 MG tablet Take 1 tablet by mouth once daily   dexlansoprazole (DEXILANT) 60 MG capsule Take 60 mg by mouth daily before breakfast.   furosemide (LASIX) 40 MG tablet TAKE 1 & 1/2 (ONE & ONE-HALF) TABLETS BY MOUTH ONCE DAILY (Patient taking differently: Take 60 mg by mouth daily.)   isosorbide mononitrate (IMDUR) 120 MG 24 hr tablet Take 1 tablet (120 mg total) by mouth daily.   levothyroxine (SYNTHROID) 75 MCG tablet Take 75 mcg by mouth daily before breakfast.   mexiletine (MEXITIL) 250 MG capsule Take 1 capsule (250 mg total) by mouth 2 (two) times daily.   Multiple Vitamin (MULTIVITAMIN WITH MINERALS) TABS tablet Take 1 tablet by mouth daily at 12 noon. Unknown strength per patient   nitroGLYCERIN (NITROSTAT) 0.4 MG SL tablet Place 1 tablet (0.4 mg total) under the tongue every 5 (five) minutes as needed for chest pain.   potassium chloride  (KLOR-CON) 10 MEQ tablet Take 1 tablet by mouth once daily (Patient taking differently: Take 10 mEq by mouth daily.)   pramipexole (MIRAPEX) 1.5 MG tablet Take 1.5 mg by mouth at bedtime.    rosuvastatin (CRESTOR) 20 MG tablet Take 1 tablet by mouth once daily   TRELEGY ELLIPTA 100-62.5-25 MCG/ACT AEPB Inhale 1 puff into the lungs daily.     Allergies:   Patient has no known allergies.   Social History   Socioeconomic History   Marital status: Married    Spouse name: Not on file   Number of children: Not on file   Years of education: Not on file   Highest education level: Not on file  Occupational History   Not on file  Tobacco Use   Smoking status: Former    Current packs/day: 0.00    Average packs/day: 0.5 packs/day for 4.0 years (2.0 ttl pk-yrs)  Types: Cigarettes    Start date: 06/11/1954    Quit date: 06/11/1958    Years since quitting: 64.9   Smokeless tobacco: Former    Types: Chew    Quit date: 12/01/1958  Vaping Use   Vaping status: Never Used  Substance and Sexual Activity   Alcohol use: No    Alcohol/week: 0.0 standard drinks of alcohol   Drug use: No   Sexual activity: Not on file  Other Topics Concern   Not on file  Social History Narrative   Not on file   Social Determinants of Health   Financial Resource Strain: Not on file  Food Insecurity: Not on file  Transportation Needs: Not on file  Physical Activity: Not on file  Stress: Not on file  Social Connections: Not on file     Family History: The patient's family history includes Asthma in his brother and maternal grandmother; Heart attack in his brother; Heart disease in his brother; Stroke in his father. ROS:   Please see the history of present illness.    All 14 point review of systems negative except as described per history of present illness  EKGs/Labs/Other Studies Reviewed:         Recent Labs: No results found for requested labs within last 365 days.  Recent Lipid Panel     Component Value Date/Time   CHOL 128 04/12/2020 0913   TRIG 111 04/12/2020 0913   HDL 48 04/12/2020 0913   CHOLHDL 2.7 04/12/2020 0913   CHOLHDL 2.5 08/13/2019 0603   VLDL 25 08/13/2019 0603   LDLCALC 60 04/12/2020 0913    Physical Exam:    VS:  BP 136/70 (BP Location: Left Arm, Patient Position: Sitting)   Pulse 60   Ht 5\' 10"  (1.778 m)   Wt 183 lb (83 kg)   SpO2 94%   BMI 26.26 kg/m     Wt Readings from Last 3 Encounters:  04/22/23 183 lb (83 kg)  03/05/23 183 lb (83 kg)  02/28/23 183 lb (83 kg)     GEN:  Well nourished, well developed in no acute distress HEENT: Normal NECK: No JVD; No carotid bruits LYMPHATICS: No lymphadenopathy CARDIAC: RRR, no murmurs, no rubs, no gallops RESPIRATORY:  Clear to auscultation without rales, wheezing or rhonchi  ABDOMEN: Soft, non-tender, non-distended MUSCULOSKELETAL:  No edema; No deformity  SKIN: Warm and dry LOWER EXTREMITIES: no swelling NEUROLOGIC:  Alert and oriented x 3 PSYCHIATRIC:  Normal affect   ASSESSMENT:    1. Angina pectoris (HCC)    PLAN:    In order of problems listed above:  Coronary artery disease.  He does have some atypical symptoms with some chest pain as well as stress test show ischemia involving anterior lateral wall.  We had a long discussion about what to do with the situation.  He is quite desperate he said something need to be done and he is willing to proceed with cardiac catheterization.  Procedure has been explained to him including all risk benefits as well as alternative we will proceed. Diminished left ventricle ejection fraction, echocardiogram showed ejection fraction 45 to 50%.  Will put him on small dose of losartan with intention to switch him to [facility]. Dyslipidemia he is on statin with LDL 55 HDL 50 this is from summer of this year. Frequent ventricular ectopy he is on mexiletine.  That being followed by our EP team. History of PE on Eliquis.   Medication Adjustments/Labs and  Tests Ordered: Current medicines are  reviewed at length with the patient today.  Concerns regarding medicines are outlined above.  Orders Placed This Encounter  Procedures   EKG 12-Lead   Medication changes: No orders of the defined types were placed in this encounter.   Signed, Georgeanna Lea, MD, [facility] 04/22/2023 10:40 AM    [facility]

## 2023-04-23 LAB — BASIC METABOLIC PANEL
BUN/Creatinine Ratio: 20 (ref 10–24)
BUN: 17 mg/dL (ref 8–27)
CO2: 26 mmol/L (ref 20–29)
Calcium: 9.9 mg/dL (ref 8.6–10.2)
Chloride: 102 mmol/L (ref 96–106)
Creatinine, Ser: 0.87 mg/dL (ref 0.76–1.27)
Glucose: 92 mg/dL (ref 70–99)
Potassium: 4.3 mmol/L (ref 3.5–5.2)
Sodium: 139 mmol/L (ref 134–144)
eGFR: 87 mL/min/{1.73_m2} (ref >59)

## 2023-04-23 LAB — CBC
Hematocrit: 38.9 % (ref 37.5–51.0)
Hemoglobin: 12.4 g/dL — ABNORMAL LOW (ref 13.0–17.7)
MCH: 31.5 pg (ref 26.6–33.0)
MCHC: 31.9 g/dL (ref 31.5–35.7)
MCV: 99 fL — ABNORMAL HIGH (ref 79–97)
Platelets: 235 10*3/uL (ref 150–450)
RBC: 3.94 x10E6/uL — ABNORMAL LOW (ref 4.14–5.80)
RDW: 12.4 % (ref 11.6–15.4)
WBC: 6.2 10*3/uL (ref 3.4–10.8)

## 2023-04-24 ENCOUNTER — Telehealth: Payer: Self-pay | Admitting: Cardiology

## 2023-04-24 DIAGNOSIS — G4733 Obstructive sleep apnea (adult) (pediatric): Secondary | ICD-10-CM | POA: Diagnosis not present

## 2023-04-24 MED ORDER — ISOSORBIDE MONONITRATE ER 120 MG PO TB24: 120.0000 mg | ORAL_TABLET | Freq: Every day | ORAL | 3 refills | Status: DC

## 2023-04-24 NOTE — Telephone Encounter (Signed)
*  STAT* If patient is at the pharmacy, call can be transferred to refill team.   1. Which medications need to be refilled? (please list name of each medication and dose if known)   isosorbide mononitrate (IMDUR) 120 MG 24 hr tablet   2. Would you like to learn more about the convenience, safety, & potential cost savings by using the [facility]?   3. Are you open to using the [facility] (Type [facility]. ).  4. Which pharmacy/location (including street and city if local pharmacy) is medication to be sent to?  [facility] - [place], [place] - [address]   5. Do they need a 30 day or 90 day supply?   90 day  Wife stated patient is completely out of this medication.

## 2023-04-29 ENCOUNTER — Telehealth: Payer: Self-pay | Admitting: *Deleted

## 2023-04-29 NOTE — Telephone Encounter (Addendum)
Cardiac Catheterization scheduled at [facility] for: Tuesday April 30, 2023 9 AM Arrival time [facility] Main Entrance A at: 7 AM  Nothing to eat after midnight prior to procedure, clear liquids until 5 AM day of procedure.  Medication instructions: -Hold:  Eliquis-none 04/28/23 until post procedure  Lasix/KCl-AM of procedure -Other usual morning medications can be taken with sips of water including aspirin 81 mg and Plavix 75 mg.  Plan to go home the same day, you will only stay overnight if medically necessary.  You must have responsible adult to drive you home.  Someone must be with you the first 24 hours after you arrive home.  Reviewed procedure instructions with patient's wife (pt verbal permission).

## 2023-04-30 ENCOUNTER — Other Ambulatory Visit: Payer: Self-pay

## 2023-04-30 ENCOUNTER — Ambulatory Visit (HOSPITAL_COMMUNITY)
Admission: RE | Admit: 2023-04-30 | Discharge: 2023-04-30 | Disposition: A | Discharge status: Home / Self Care | Payer: PPO | Attending: Internal Medicine | Admitting: Internal Medicine

## 2023-04-30 ENCOUNTER — Encounter (HOSPITAL_COMMUNITY)
Admission: RE | Disposition: A | Discharge status: Home / Self Care | Payer: Self-pay | Source: Home / Self Care | Attending: Internal Medicine

## 2023-04-30 DIAGNOSIS — R0600 Dyspnea, unspecified: Secondary | ICD-10-CM | POA: Diagnosis not present

## 2023-04-30 DIAGNOSIS — R9439 Abnormal result of other cardiovascular function study: Secondary | ICD-10-CM | POA: Diagnosis present

## 2023-04-30 DIAGNOSIS — Z7901 Long term (current) use of anticoagulants: Secondary | ICD-10-CM | POA: Diagnosis not present

## 2023-04-30 DIAGNOSIS — Z87891 Personal history of nicotine dependence: Secondary | ICD-10-CM | POA: Insufficient documentation

## 2023-04-30 DIAGNOSIS — I255 Ischemic cardiomyopathy: Secondary | ICD-10-CM | POA: Insufficient documentation

## 2023-04-30 DIAGNOSIS — Z955 Presence of coronary angioplasty implant and graft: Secondary | ICD-10-CM | POA: Diagnosis not present

## 2023-04-30 DIAGNOSIS — Z86711 Personal history of pulmonary embolism: Secondary | ICD-10-CM | POA: Insufficient documentation

## 2023-04-30 DIAGNOSIS — J984 Other disorders of lung: Secondary | ICD-10-CM | POA: Diagnosis not present

## 2023-04-30 DIAGNOSIS — I25119 Atherosclerotic heart disease of native coronary artery with unspecified angina pectoris: Secondary | ICD-10-CM | POA: Insufficient documentation

## 2023-04-30 DIAGNOSIS — Z79899 Other long term (current) drug therapy: Secondary | ICD-10-CM | POA: Diagnosis not present

## 2023-04-30 DIAGNOSIS — Z951 Presence of aortocoronary bypass graft: Secondary | ICD-10-CM | POA: Diagnosis not present

## 2023-04-30 DIAGNOSIS — I2510 Atherosclerotic heart disease of native coronary artery without angina pectoris: Secondary | ICD-10-CM | POA: Diagnosis not present

## 2023-04-30 DIAGNOSIS — I25709 Atherosclerosis of coronary artery bypass graft(s), unspecified, with unspecified angina pectoris: Secondary | ICD-10-CM

## 2023-04-30 DIAGNOSIS — E785 Hyperlipidemia, unspecified: Secondary | ICD-10-CM | POA: Diagnosis not present

## 2023-04-30 DIAGNOSIS — I493 Ventricular premature depolarization: Secondary | ICD-10-CM | POA: Diagnosis not present

## 2023-04-30 HISTORY — PX: LEFT HEART CATH AND CORS/GRAFTS ANGIOGRAPHY: CATH118250

## 2023-04-30 LAB — POCT ACTIVATED CLOTTING TIME: Activated Clotting Time: 129 s

## 2023-04-30 SURGERY — LEFT HEART CATH AND CORS/GRAFTS ANGIOGRAPHY
Anesthesia: LOCAL

## 2023-04-30 MED ORDER — HEPARIN SODIUM (PORCINE) 1000 UNIT/ML IJ SOLN
INTRAMUSCULAR | Status: AC
  Filled 2023-04-30: qty 10

## 2023-04-30 MED ORDER — SODIUM CHLORIDE 0.9% FLUSH: 3.0000 mL | Freq: Two times a day (BID) | INTRAVENOUS | Status: DC

## 2023-04-30 MED ORDER — SODIUM CHLORIDE 0.9 % WEIGHT BASED INFUSION: 1.0000 mL/kg/h | INTRAVENOUS | Status: DC

## 2023-04-30 MED ORDER — MIDAZOLAM HCL 2 MG/2ML IJ SOLN
INTRAMUSCULAR | Status: DC | PRN
  Administered 2023-04-30: 1 mg via INTRAVENOUS

## 2023-04-30 MED ORDER — FENTANYL CITRATE (PF) 100 MCG/2ML IJ SOLN
INTRAMUSCULAR | Status: AC
  Filled 2023-04-30: qty 2

## 2023-04-30 MED ORDER — SODIUM CHLORIDE 0.9% FLUSH: 3.0000 mL | INTRAVENOUS | Status: DC | PRN

## 2023-04-30 MED ORDER — LABETALOL HCL 5 MG/ML IV SOLN: 10.0000 mg | INTRAVENOUS | Status: DC | PRN

## 2023-04-30 MED ORDER — HEPARIN (PORCINE) IN NACL 1000-0.9 UT/500ML-% IV SOLN
INTRAVENOUS | Status: DC | PRN
  Administered 2023-04-30 (×2): 500 mL

## 2023-04-30 MED ORDER — HEPARIN SODIUM (PORCINE) 1000 UNIT/ML IJ SOLN
INTRAMUSCULAR | Status: DC | PRN
  Administered 2023-04-30: 2000 [IU] via INTRAVENOUS

## 2023-04-30 MED ORDER — ONDANSETRON HCL 4 MG/2ML IJ SOLN: 4.0000 mg | Freq: Four times a day (QID) | INTRAMUSCULAR | Status: DC | PRN

## 2023-04-30 MED ORDER — FUROSEMIDE 40 MG PO TABS: 40.0000 mg | ORAL_TABLET | Freq: Two times a day (BID) | ORAL | 0 refills | Status: DC

## 2023-04-30 MED ORDER — SODIUM CHLORIDE 0.9 % WEIGHT BASED INFUSION
3.0000 mL/kg/h | INTRAVENOUS | Status: AC
  Administered 2023-04-30: 3 mL/kg/h via INTRAVENOUS

## 2023-04-30 MED ORDER — LIDOCAINE HCL (PF) 1 % IJ SOLN
INTRAMUSCULAR | Status: AC
  Filled 2023-04-30: qty 30

## 2023-04-30 MED ORDER — MIDAZOLAM HCL 2 MG/2ML IJ SOLN
INTRAMUSCULAR | Status: AC
  Filled 2023-04-30: qty 2

## 2023-04-30 MED ORDER — FENTANYL CITRATE (PF) 100 MCG/2ML IJ SOLN
INTRAMUSCULAR | Status: DC | PRN
  Administered 2023-04-30: 25 ug via INTRAVENOUS

## 2023-04-30 MED ORDER — FUROSEMIDE 40 MG PO TABS: 60.0000 mg | ORAL_TABLET | Freq: Two times a day (BID) | ORAL | 0 refills | Status: DC

## 2023-04-30 MED ORDER — SODIUM CHLORIDE 0.9 % IV SOLN: 250.0000 mL | INTRAVENOUS | Status: DC | PRN

## 2023-04-30 MED ORDER — CLOPIDOGREL BISULFATE 75 MG PO TABS: 75.0000 mg | ORAL_TABLET | ORAL | Status: DC

## 2023-04-30 MED ORDER — ACETAMINOPHEN 325 MG PO TABS: 650.0000 mg | ORAL_TABLET | ORAL | Status: DC | PRN

## 2023-04-30 MED ORDER — HYDRALAZINE HCL 20 MG/ML IJ SOLN: 10.0000 mg | INTRAMUSCULAR | Status: DC | PRN

## 2023-04-30 MED ORDER — IOHEXOL 350 MG/ML SOLN
INTRAVENOUS | Status: DC | PRN
  Administered 2023-04-30: 100 mL

## 2023-04-30 MED ORDER — LIDOCAINE HCL (PF) 1 % IJ SOLN
INTRAMUSCULAR | Status: DC | PRN
  Administered 2023-04-30: 12 mL

## 2023-04-30 MED ORDER — ASPIRIN 81 MG PO CHEW: 81.0000 mg | CHEWABLE_TABLET | ORAL | Status: DC

## 2023-04-30 SURGICAL SUPPLY — 9 items
CATH INFINITI 5FR MULTPACK ANG (CATHETERS) ×1
MAT PREVALON FULL STRYKER (MISCELLANEOUS) ×1
PACK CARDIAC CATHETERIZATION (CUSTOM PROCEDURE TRAY) ×1
SET ATX-X65L (MISCELLANEOUS) ×1
SHEATH PINNACLE 5F 10CM (SHEATH) ×1
SHEATH PROBE COVER 6X72 (BAG) ×1
TUBING CIL FLEX 10 FLL-RA (TUBING) ×1
WIRE EMERALD 3MM-J .035X150CM (WIRE) ×1
WIRE MICRO SET SILHO 5FR 7 (SHEATH) ×1

## 2023-04-30 NOTE — Progress Notes (Signed)
Site area: R groin Site Prior to Removal:  Level 0 Pressure Applied For: 25 min Manual:   yes Patient Status During Pull:  stable Post Pull Site:  Level 0 Post Pull Instructions Given:  yes Post Pull Pulses Present: bilateral DP +3 Dressing Applied:  gauze & tegaderm Bedrest begins @ 1436 Comments:

## 2023-04-30 NOTE — Interval H&P Note (Signed)
History and Physical Interval Note:  04/30/2023 8:19 AM  Shane Massey  has presented today for surgery, with the diagnosis of abnormal stress test.  The various methods of treatment have been discussed with the patient and family. After consideration of risks, benefits and other options for treatment, the patient has consented to  Procedure(s): LEFT HEART CATH AND CORONARY ANGIOGRAPHY (N/A) as a surgical intervention.  The patient's history has been reviewed, patient examined, no change in status, stable for surgery.  I have reviewed the patient's chart and labs.  Questions were answered to the patient's satisfaction.     Orbie Pyo

## 2023-04-30 NOTE — Discharge Instructions (Addendum)
Restart Eliquis on 05/01/23  Femoral Site Care This sheet gives you information about how to care for yourself after your procedure. Your health care provider may also give you more specific instructions. If you have problems or questions, contact your health care provider. What can I expect after the procedure?  After the procedure, it is common to have: Bruising that usually fades within 1-2 weeks. Tenderness at the site. Follow these instructions at home: Wound care Follow instructions from your health care provider about how to take care of your insertion site. Make sure you: Wash your hands with soap and water before you change your bandage (dressing). If soap and water are not available, use hand sanitizer. Remove your dressing as told by your health care provider. 24 hours Do not take baths, swim, or use a hot tub until your health care provider approves. You may shower 24-48 hours after the procedure or as told by your health care provider. Gently wash the site with plain soap and water. Pat the area dry with a clean towel. Do not rub the site. This may cause bleeding. Do not apply powder or lotion to the site. Keep the site clean and dry. Check your femoral site every day for signs of infection. Check for: Redness, swelling, or pain. Fluid or blood. Warmth. Pus or a bad smell. Activity For the first 2-3 days after your procedure, or as long as directed: Avoid climbing stairs as much as possible. Do not squat. Do not lift anything that is heavier than 10 lb (4.5 kg), or the limit that you are told, until your health care provider says that it is safe. For 5 days Rest as directed. Avoid sitting for a long time without moving. Get up to take short walks every 1-2 hours. Do not drive for 24 hours if you were given a medicine to help you relax (sedative). General instructions Take over-the-counter and prescription medicines only as told by your health care provider. Keep all  follow-up visits as told by your health care provider. This is important. Contact a health care provider if you have: A fever or chills. You have redness, swelling, or pain around your insertion site. Get help right away if: The catheter insertion area swells very fast. You pass out. You suddenly start to sweat or your skin gets clammy. The catheter insertion area is bleeding, and the bleeding does not stop when you hold steady pressure on the area. The area near or just beyond the catheter insertion site becomes pale, cool, tingly, or numb. These symptoms may represent a serious problem that is an emergency. Do not wait to see if the symptoms will go away. Get medical help right away. Call your local emergency services (911 in the [place] Do not drive yourself to the hospital. Summary After the procedure, it is common to have bruising that usually fades within 1-2 weeks. Check your femoral site every day for signs of infection. Do not lift anything that is heavier than 10 lb (4.5 kg), or the limit that you are told, until your health care provider says that it is safe. This information is not intended to replace advice given to you by your health care provider. Make sure you discuss any questions you have with your health care provider. Document Revised: 06/10/2017 Document Reviewed: 06/10/2017 Elsevier Patient Education  2020 [employer].

## 2023-05-01 ENCOUNTER — Telehealth: Payer: Self-pay | Admitting: Pulmonary Disease

## 2023-05-01 ENCOUNTER — Encounter (HOSPITAL_COMMUNITY): Payer: Self-pay | Admitting: Internal Medicine

## 2023-05-01 ENCOUNTER — Ambulatory Visit: Payer: PPO | Admitting: Pulmonary Disease

## 2023-05-01 NOTE — Telephone Encounter (Signed)
Patient has a catheter and is unable to make it to his appointment today. Wife would like a call if anything has changed with his x-ray. They are unable to use MyChart successfully so a MyChart video visit would not be helpful. Will reschedule appointment for a later date. [phone]

## 2023-05-02 DIAGNOSIS — L578 Other skin changes due to chronic exposure to nonionizing radiation: Secondary | ICD-10-CM | POA: Diagnosis not present

## 2023-05-02 DIAGNOSIS — L570 Actinic keratosis: Secondary | ICD-10-CM | POA: Diagnosis not present

## 2023-05-02 DIAGNOSIS — L821 Other seborrheic keratosis: Secondary | ICD-10-CM | POA: Diagnosis not present

## 2023-05-06 NOTE — Telephone Encounter (Signed)
Atc pt no answer, lvmm for pt to give the office a call back. Ct results have not been read yet

## 2023-05-06 NOTE — Telephone Encounter (Signed)
PT's wife calling back. States the message said she had the results. Please call to clarify. TY  Her # is [phone]

## 2023-05-07 NOTE — Telephone Encounter (Signed)
LVM stating CT has not been read yet, we have requested this be done ASAP and once we have received the results and provider has reviewed we will be in touch.

## 2023-05-07 NOTE — Telephone Encounter (Addendum)
Pt wife returning missed call you can leave a detailed VM

## 2023-05-07 NOTE — Telephone Encounter (Signed)
Lm x1 for patient's wife, Edith(DPR).  CT has not been read by radiologist. Sherron Monday to [facility] with [facility] radiologist and requested that CT be read.

## 2023-05-12 ENCOUNTER — Other Ambulatory Visit: Payer: Self-pay | Admitting: Cardiology

## 2023-05-12 DIAGNOSIS — I10 Essential (primary) hypertension: Secondary | ICD-10-CM | POA: Diagnosis not present

## 2023-05-12 DIAGNOSIS — E039 Hypothyroidism, unspecified: Secondary | ICD-10-CM | POA: Diagnosis not present

## 2023-05-13 ENCOUNTER — Telehealth: Payer: Self-pay | Admitting: Cardiology

## 2023-05-13 ENCOUNTER — Other Ambulatory Visit: Payer: Self-pay

## 2023-05-13 MED ORDER — FUROSEMIDE 40 MG PO TABS: 60.0000 mg | ORAL_TABLET | Freq: Two times a day (BID) | ORAL | 3 refills | Status: DC

## 2023-05-13 NOTE — Telephone Encounter (Signed)
Pt c/o medication issue:  1. Name of Medication: furosemide (LASIX) 40 MG tablet   2. How are you currently taking this medication (dosage and times per day)? As prescribed  3. Are you having a reaction (difficulty breathing--STAT)? No   4. What is your medication issue? Patient's spouse is calling requesting prescription be updated and resent for a full 90 day supply. She reports he has enough medication to last him til Wednesday. Please advise.

## 2023-05-19 NOTE — Telephone Encounter (Signed)
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

## 2023-05-21 ENCOUNTER — Ambulatory Visit: Payer: PPO | Admitting: Podiatry

## 2023-05-24 DIAGNOSIS — R1032 Left lower quadrant pain: Secondary | ICD-10-CM | POA: Diagnosis not present

## 2023-05-24 DIAGNOSIS — R109 Unspecified abdominal pain: Secondary | ICD-10-CM | POA: Diagnosis not present

## 2023-05-24 DIAGNOSIS — Z6828 Body mass index (BMI) 28.0-28.9, adult: Secondary | ICD-10-CM | POA: Diagnosis not present

## 2023-05-26 ENCOUNTER — Other Ambulatory Visit: Payer: Self-pay | Admitting: Cardiology

## 2023-05-27 DIAGNOSIS — Z6826 Body mass index (BMI) 26.0-26.9, adult: Secondary | ICD-10-CM | POA: Diagnosis not present

## 2023-05-27 DIAGNOSIS — K5792 Diverticulitis of intestine, part unspecified, without perforation or abscess without bleeding: Secondary | ICD-10-CM | POA: Diagnosis not present

## 2023-06-02 ENCOUNTER — Other Ambulatory Visit: Payer: Self-pay | Admitting: Cardiology

## 2023-06-12 DIAGNOSIS — I10 Essential (primary) hypertension: Secondary | ICD-10-CM | POA: Diagnosis not present

## 2023-06-12 DIAGNOSIS — E039 Hypothyroidism, unspecified: Secondary | ICD-10-CM | POA: Diagnosis not present

## 2023-06-19 DIAGNOSIS — R7303 Prediabetes: Secondary | ICD-10-CM | POA: Diagnosis not present

## 2023-06-19 DIAGNOSIS — E785 Hyperlipidemia, unspecified: Secondary | ICD-10-CM | POA: Diagnosis not present

## 2023-06-19 DIAGNOSIS — E039 Hypothyroidism, unspecified: Secondary | ICD-10-CM | POA: Diagnosis not present

## 2023-06-21 DIAGNOSIS — G4733 Obstructive sleep apnea (adult) (pediatric): Secondary | ICD-10-CM | POA: Diagnosis not present

## 2023-06-23 ENCOUNTER — Other Ambulatory Visit: Payer: Self-pay | Admitting: Cardiology

## 2023-06-24 ENCOUNTER — Ambulatory Visit: Payer: PPO | Attending: Cardiology | Admitting: Cardiology

## 2023-06-24 ENCOUNTER — Encounter: Payer: Self-pay | Admitting: Cardiology

## 2023-06-24 VITALS — BP 126/60 | HR 62 | Ht 70.0 in | Wt 179.8 lb

## 2023-06-24 DIAGNOSIS — Z951 Presence of aortocoronary bypass graft: Secondary | ICD-10-CM | POA: Diagnosis not present

## 2023-06-24 DIAGNOSIS — I480 Paroxysmal atrial fibrillation: Secondary | ICD-10-CM | POA: Diagnosis not present

## 2023-06-24 DIAGNOSIS — Z86711 Personal history of pulmonary embolism: Secondary | ICD-10-CM | POA: Diagnosis not present

## 2023-06-24 DIAGNOSIS — R0609 Other forms of dyspnea: Secondary | ICD-10-CM | POA: Diagnosis not present

## 2023-06-24 DIAGNOSIS — E785 Hyperlipidemia, unspecified: Secondary | ICD-10-CM

## 2023-06-24 DIAGNOSIS — I255 Ischemic cardiomyopathy: Secondary | ICD-10-CM | POA: Diagnosis not present

## 2023-06-24 MED ORDER — APIXABAN 5 MG PO TABS: 5.0000 mg | ORAL_TABLET | Freq: Two times a day (BID) | ORAL | 0 refills | Status: DC

## 2023-06-24 NOTE — Patient Instructions (Signed)
Medication Instructions:  Your physician recommends that you continue on your current medications as directed. Please refer to the Current Medication list given to you today.  *If you need a refill on your cardiac medications before your next appointment, please call your pharmacy*   Lab Work: BMP, ProBNP - today If you have labs (blood work) drawn today and your tests are completely normal, you will receive your results only by: [place] (if you have MyChart) OR A paper copy in the mail If you have any lab test that is abnormal or we need to change your treatment, we will call you to review the results.   Testing/Procedures: None Ordered   Follow-Up: At [facility], you and your health needs are our priority.  As part of our continuing mission to provide you with exceptional heart care, we have created designated Provider Care Teams.  These Care Teams include your primary Cardiologist (physician) and Advanced Practice Providers (APPs -  Physician Assistants and Nurse Practitioners) who all work together to provide you with the care you need, when you need it.  We recommend signing up for the patient portal called "MyChart".  Sign up information is provided on this After Visit Summary.  MyChart is used to connect with patients for Virtual Visits (Telemedicine).  Patients are able to view lab/test results, encounter notes, upcoming appointments, etc.  Non-urgent messages can be sent to your provider as well.   To learn more about what you can do with MyChart, go to [email or web address].    Your next appointment:   5 month(s)  The format for your next appointment:   In Person  Provider:   Jenne Campus, MD    Other Instructions NA

## 2023-06-24 NOTE — Progress Notes (Signed)
 Cardiology Office Note:    Date:  06/24/2023   ID:  Shane Massey, DOB 03-Apr-1941, MRN [id number]  PCP:  Shane Glisson, MD  Cardiologist:  Shane Fitch, MD    Referring MD: Shane Glisson, MD   Chief Complaint  Patient presents with   Follow-up    History of Present Illness:    Shane Massey is a 83 y.o. male   with past medical history significant for coronary artery disease.  In March 2017 he required PTCA and stenting of the mid LAD he also have history of ischemic cardiomyopathy however ejection fraction determined in January 2020 was normal.  He been complaining of fatigue tiredness for long period time eventually we decided to pursue cardiac catheterization.  That showed 60 to 75% stenosis of left main, 80% stenosis of ramus intermedius, 50% stenosis first diagonal branch on 08/13/2019 he ended up having coronary bypass graft with LIMA to LAD, left radial to second obtuse marginal branch and RIMA to ramus intermedius at the same time he did have PFO closure.  Recovery was complicated by an episode of atrial fibrillation successfully managed with  He has been struggling for a while with shortness of breath he had difficulty finding out what the problem was extensive evaluation has been performed included echocardiogram as well as stress test which was negative eventually D-dimer has been performed which was high CT of the chest showed pulmonary emboli.  End up being admitted to the hospital put on anticoagulation and discharged home however after that he end up 1 more time in the emergency room with shortness of breath no new pulmonary emboli.  Recently he started having some strength symptoms monitor has been placed he was find to have nonsustained ventricular tachycardia but frequent episodes, he was seen by my colleague Dr. Inocencio in EP consultation.  He was put on mexiletine.  Last time I am seeing him he was still complaining 5 some symptoms eventually cardiac  catheterization was done which show only 1 branch of circumflex artery very small with 80% stenosis everything else seems to be nicely open except left main which Chronic significant stenosis which we knew about and that is why he got bypass surgery.  He was found to be elevated LVEDP 30 mmHg, dose of diuretic has been increased he comes today to my office feeling better shortness of breath is better.  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   Ischemic cardiomyopathy 10/24/2016   Overview:  Ejection fraction 45% in spring 2018   Myocardial infarction [facility])    Near syncope    Nonsustained ventricular tachycardia (HCC) 07/17/2018   OSA (obstructive sleep apnea)    OSA on CPAP 11/14/2015   CPAP 13    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   Paroxysmal atrial fibrillation (HCC) 09/09/2019   Pulmonary embolism (HCC)    Restless leg syndrome 11/30/2016   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    Past Surgical History:  Procedure Laterality Date   CATARACT EXTRACTION  2012   COLONOSCOPY  2013   CORONARY ARTERY BYPASS GRAFT N/A 08/13/2019   Procedure: CORONARY ARTERY BYPASS GRAFTING (CABG) TIMES 3.;  Surgeon: Shane Bartlett PEDLAR, MD;  Location: [facility] OR;  Service: Open Heart Surgery;  Laterality: N/A;   CORONARY STENT PLACEMENT     heart stent Left 09/2015   HERNIA REPAIR  11/1977   LEFT HEART CATH AND CORONARY ANGIOGRAPHY N/A 08/06/2019   Procedure: LEFT HEART CATH AND CORONARY ANGIOGRAPHY;  Surgeon: Shane Debby LABOR, MD;   Location: [facility] INVASIVE CV LAB;  Service: Cardiovascular;  Laterality: N/A;   LEFT HEART CATH AND CORS/GRAFTS ANGIOGRAPHY N/A 04/30/2023   Procedure: LEFT HEART CATH AND CORS/GRAFTS ANGIOGRAPHY;  Surgeon: Shane Lurena POUR, MD;  Location: [facility] INVASIVE CV LAB;  Service: Cardiovascular;  Laterality: N/A;   RADIAL ARTERY HARVEST Left 08/13/2019   Procedure: OPEN RADIAL ARTERY HARVEST;  Surgeon: Shane Bartlett PEDLAR, MD;  Location: [facility] OR;  Service: Open Heart Surgery;  Laterality: Left;   REPAIR OF PATENT FORAMEN OVALE N/A 08/13/2019   Procedure: CLOSURE OF PATENT FORAMEN OVALE;  Surgeon: Shane Bartlett PEDLAR, MD;  Location: [facility] OR;  Service: Open Heart Surgery;  Laterality: N/A;   RTC repair  Right 11/03/2020   SURGERY FOR BOWEL ABSCESS  10/1959   TEE WITHOUT CARDIOVERSION N/A 08/13/2019   Procedure: TRANSESOPHAGEAL ECHOCARDIOGRAM (TEE);  Surgeon: Shane Bartlett PEDLAR, MD;  Location[facility] OR;  Service: Open Heart Surgery;  Laterality: N/A;    Current Medications: Current Meds  Medication Sig   apixaban  (ELIQUIS ) 5 MG TABS tablet Take 1 tablet by mouth twice daily   Calcium  Carb-Cholecalciferol (CALCIUM  600+D3 PO) Take 1 tablet by mouth daily at 12 noon.   clopidogrel  (PLAVIX ) 75 MG tablet Take 1 tablet by mouth once daily   dexlansoprazole (DEXILANT) 60 MG capsule Take 60 mg by mouth daily before breakfast.   furosemide  (LASIX ) 40 MG tablet Take 1.5 tablets (60 mg total) by mouth 2 (two) times daily.   isosorbide  mononitrate (IMDUR ) 120 MG 24 hr tablet Take 1 tablet (120 mg total) by mouth daily.   levothyroxine  (SYNTHROID ) 75 MCG tablet Take 75 mcg by mouth daily before breakfast.   losartan  (COZAAR ) 25 MG tablet Take 25 mg by mouth daily.   mexiletine (MEXITIL) 250 MG capsule Take 1 capsule (250 mg total) by mouth 2 (two) times daily.   Multiple Vitamin (MULTIVITAMIN WITH MINERALS) TABS tablet Take 1 tablet by mouth daily at 12 noon. Unknown strength per patient   nitroGLYCERIN  (NITROSTAT ) 0.4 MG SL tablet  Place 1 tablet (0.4 mg total) under the tongue every 5 (five) minutes as needed for chest pain.   potassium chloride  (KLOR-CON ) 10 MEQ tablet Take 1 tablet by mouth once daily (Patient taking differently: Take 10 mEq by mouth daily.)   pramipexole  (MIRAPEX ) 1.5 MG tablet Take 1.5 mg by mouth at bedtime.    rosuvastatin  (CRESTOR ) 20 MG tablet Take 1 tablet by mouth once daily (Patient taking differently: Take 20 mg by mouth at bedtime.)   TRELEGY ELLIPTA  100-62.5-25 MCG/ACT AEPB Inhale 1 puff into the lungs daily.     Allergies:   Patient has no known allergies.   Social History   Socioeconomic History   Marital status: Married    Spouse name: Not on file   Number of children: Not on file   Years of education: Not on file   Highest education level: Not on file  Occupational History   Not on file  Tobacco Use  Smoking status: Former    Current packs/day: 0.00    Average packs/day: 0.5 packs/day for 4.0 years (2.0 ttl pk-yrs)    Types: Cigarettes    Start date: 06/11/1954    Quit date: 06/11/1958    Years since quitting: 65.0   Smokeless tobacco: Former    Types: Chew    Quit date: 12/01/1958  Vaping Use   Vaping status: Never Used  Substance and Sexual Activity   Alcohol  use: No    Alcohol /week: 0.0 standard drinks of alcohol    Drug use: No   Sexual activity: Not on file  Other Topics Concern   Not on file  Social History Narrative   Not on file   Social Drivers of Health   Financial Resource Strain: Not on file  Food Insecurity: Not on file  Transportation Needs: Not on file  Physical Activity: Not on file  Stress: Not on file  Social Connections: Not on file     Family History: The patient's family history includes Asthma in his brother and maternal grandmother; Heart attack in his brother; Heart disease in his brother; Stroke in his father. ROS:   Please see the history of present illness.    All 14 point review of systems negative except as described per history  of present illness  EKGs/Labs/Other Studies Reviewed:         Recent Labs: 04/22/2023: BUN 17; Creatinine, Ser 0.87; Hemoglobin 12.4; Platelets 235; Potassium 4.3; Sodium 139  Recent Lipid Panel    Component Value Date/Time   CHOL 128 04/12/2020 0913   TRIG 111 04/12/2020 0913   HDL 48 04/12/2020 0913   CHOLHDL 2.7 04/12/2020 0913   CHOLHDL 2.5 08/13/2019 0603   VLDL 25 08/13/2019 0603   LDLCALC 60 04/12/2020 0913    Physical Exam:    VS:  BP 126/60 (BP Location: Left Arm, Patient Position: Sitting)   Pulse 62   Ht 5' 10 (1.778 m)   Wt 179 lb 12.8 oz (81.6 kg)   SpO2 94%   BMI 25.80 kg/m     Wt Readings from Last 3 Encounters:  06/24/23 179 lb 12.8 oz (81.6 kg)  04/30/23 183 lb (83 kg)  04/22/23 183 lb (83 kg)     GEN:  Well nourished, well developed in no acute distress HEENT: Normal NECK: No JVD; No carotid bruits LYMPHATICS: No lymphadenopathy CARDIAC: RRR, no murmurs, no rubs, no gallops RESPIRATORY:  Clear to auscultation without rales, wheezing or rhonchi  ABDOMEN: Soft, non-tender, non-distended MUSCULOSKELETAL:  No edema; No deformity  SKIN: Warm and dry LOWER EXTREMITIES: no swelling NEUROLOGIC:  Alert and oriented x 3 PSYCHIATRIC:  Normal affect   ASSESSMENT:    1. S/P CABG x 3   2. Paroxysmal atrial fibrillation (HCC)   3. History of pulmonary embolism   4. Dyslipidemia   5. Ischemic cardiomyopathy    PLAN:    In order of problems listed above:  Coronary disease status post coronary bypass graft.  Cardiac catheterization performed.  Showed patent grafts.  Continue conservative medical therapy. Paroxysmal atrial fibrillation.  On Eliquis  which I will continue. Cardiomyopathy ejection fraction 45%.  He is on losartan , dose of diuretic has been increased upon discharge from hospital after cardiac catheterization we will check Chem-7 as well as proBNP based on that we decide what will be next move anticipate need to switch from losartan  to  Entresto  and then follow-up Chem-7. Dyslipidemia I did review K PN which show me data from summer of  last year LDL 55 HDL 50 we will continue present management   Medication Adjustments/Labs and Tests Ordered: Current medicines are reviewed at length with the patient today.  Concerns regarding medicines are outlined above.  No orders of the defined types were placed in this encounter.  Medication changes: No orders of the defined types were placed in this encounter.   Signed, Shane DOROTHA Fitch, MD, [facility] 06/24/2023 9:00 AM    [facility]

## 2023-06-24 NOTE — Addendum Note (Signed)
 Addended by: Baldo Ash D on: 06/24/2023 09:11 AM   Modules accepted: Orders

## 2023-06-25 LAB — BASIC METABOLIC PANEL
BUN/Creatinine Ratio: 13 (ref 10–24)
BUN: 13 mg/dL (ref 8–27)
CO2: 23 mmol/L (ref 20–29)
Calcium: 9.7 mg/dL (ref 8.6–10.2)
Chloride: 102 mmol/L (ref 96–106)
Creatinine, Ser: 0.97 mg/dL (ref 0.76–1.27)
Glucose: 94 mg/dL (ref 70–99)
Potassium: 4.4 mmol/L (ref 3.5–5.2)
Sodium: 143 mmol/L (ref 134–144)
eGFR: 79 mL/min/{1.73_m2} (ref >59)

## 2023-06-25 LAB — PRO B NATRIURETIC PEPTIDE: NT-Pro BNP: 301 pg/mL (ref 0–486)

## 2023-06-26 DIAGNOSIS — D485 Neoplasm of uncertain behavior of skin: Secondary | ICD-10-CM | POA: Diagnosis not present

## 2023-06-28 ENCOUNTER — Other Ambulatory Visit: Payer: Self-pay

## 2023-06-28 MED ORDER — ROSUVASTATIN CALCIUM 20 MG PO TABS: 20.0000 mg | ORAL_TABLET | Freq: Every day | ORAL | 3 refills | Status: AC

## 2023-07-03 DIAGNOSIS — Z6827 Body mass index (BMI) 27.0-27.9, adult: Secondary | ICD-10-CM | POA: Diagnosis not present

## 2023-07-03 DIAGNOSIS — I10 Essential (primary) hypertension: Secondary | ICD-10-CM | POA: Diagnosis not present

## 2023-07-03 DIAGNOSIS — R7303 Prediabetes: Secondary | ICD-10-CM | POA: Diagnosis not present

## 2023-07-03 DIAGNOSIS — E785 Hyperlipidemia, unspecified: Secondary | ICD-10-CM | POA: Diagnosis not present

## 2023-07-03 DIAGNOSIS — E039 Hypothyroidism, unspecified: Secondary | ICD-10-CM | POA: Diagnosis not present

## 2023-07-04 DIAGNOSIS — C44629 Squamous cell carcinoma of skin of left upper limb, including shoulder: Secondary | ICD-10-CM | POA: Diagnosis not present

## 2023-07-07 ENCOUNTER — Other Ambulatory Visit: Payer: Self-pay | Admitting: Cardiology

## 2023-07-08 ENCOUNTER — Ambulatory Visit: Payer: PPO | Admitting: Pulmonary Disease

## 2023-07-08 DIAGNOSIS — I480 Paroxysmal atrial fibrillation: Secondary | ICD-10-CM | POA: Diagnosis not present

## 2023-07-08 DIAGNOSIS — G4733 Obstructive sleep apnea (adult) (pediatric): Secondary | ICD-10-CM | POA: Diagnosis not present

## 2023-07-08 DIAGNOSIS — R531 Weakness: Secondary | ICD-10-CM | POA: Diagnosis not present

## 2023-07-08 DIAGNOSIS — J849 Interstitial pulmonary disease, unspecified: Secondary | ICD-10-CM | POA: Diagnosis not present

## 2023-07-08 DIAGNOSIS — I255 Ischemic cardiomyopathy: Secondary | ICD-10-CM | POA: Diagnosis not present

## 2023-07-08 DIAGNOSIS — I2510 Atherosclerotic heart disease of native coronary artery without angina pectoris: Secondary | ICD-10-CM | POA: Diagnosis not present

## 2023-07-13 DIAGNOSIS — E039 Hypothyroidism, unspecified: Secondary | ICD-10-CM | POA: Diagnosis not present

## 2023-07-13 DIAGNOSIS — I10 Essential (primary) hypertension: Secondary | ICD-10-CM | POA: Diagnosis not present

## 2023-07-15 ENCOUNTER — Encounter: Payer: Self-pay | Admitting: Neurology

## 2023-07-22 ENCOUNTER — Telehealth: Payer: Self-pay

## 2023-07-22 NOTE — Telephone Encounter (Signed)
 Att x 1 to call pt. No answer

## 2023-07-25 DIAGNOSIS — G4733 Obstructive sleep apnea (adult) (pediatric): Secondary | ICD-10-CM | POA: Diagnosis not present

## 2023-07-26 ENCOUNTER — Telehealth: Payer: Self-pay

## 2023-07-26 DIAGNOSIS — I10 Essential (primary) hypertension: Secondary | ICD-10-CM

## 2023-07-26 MED ORDER — SACUBITRIL-VALSARTAN 24-26 MG PO TABS: 1.0000 | ORAL_TABLET | Freq: Two times a day (BID) | ORAL | 3 refills | Status: DC

## 2023-07-26 NOTE — Telephone Encounter (Signed)
Spoke with spouse regarding stopping Losartan and starting Entresto 24-26 BID. BMP in 1 week after starting Entresto. Spouse verbalized understanding and had no other questions. Routed to PCP.

## 2023-08-05 DIAGNOSIS — I10 Essential (primary) hypertension: Secondary | ICD-10-CM | POA: Diagnosis not present

## 2023-08-06 DIAGNOSIS — S61011A Laceration without foreign body of right thumb without damage to nail, initial encounter: Secondary | ICD-10-CM | POA: Diagnosis not present

## 2023-08-06 DIAGNOSIS — G4733 Obstructive sleep apnea (adult) (pediatric): Secondary | ICD-10-CM | POA: Diagnosis not present

## 2023-08-06 LAB — BASIC METABOLIC PANEL
BUN/Creatinine Ratio: 20 (ref 10–24)
BUN: 22 mg/dL (ref 8–27)
CO2: 24 mmol/L (ref 20–29)
Calcium: 9.9 mg/dL (ref 8.6–10.2)
Chloride: 104 mmol/L (ref 96–106)
Creatinine, Ser: 1.11 mg/dL (ref 0.76–1.27)
Glucose: 100 mg/dL — ABNORMAL HIGH (ref 70–99)
Potassium: 4.6 mmol/L (ref 3.5–5.2)
Sodium: 144 mmol/L (ref 134–144)
eGFR: 67 mL/min/{1.73_m2} (ref >59)

## 2023-08-10 DIAGNOSIS — E039 Hypothyroidism, unspecified: Secondary | ICD-10-CM | POA: Diagnosis not present

## 2023-08-10 DIAGNOSIS — I10 Essential (primary) hypertension: Secondary | ICD-10-CM | POA: Diagnosis not present

## 2023-08-14 ENCOUNTER — Ambulatory Visit: Payer: PPO | Admitting: Pulmonary Disease

## 2023-08-14 ENCOUNTER — Encounter: Payer: Self-pay | Admitting: Neurology

## 2023-08-14 ENCOUNTER — Ambulatory Visit: Payer: PPO | Admitting: Neurology

## 2023-08-14 ENCOUNTER — Other Ambulatory Visit: Payer: Self-pay

## 2023-08-14 ENCOUNTER — Encounter: Payer: Self-pay | Admitting: Pulmonary Disease

## 2023-08-14 VITALS — BP 131/46 | HR 69 | Ht 70.0 in | Wt 182.2 lb

## 2023-08-14 VITALS — BP 132/62 | HR 52 | Ht 70.0 in | Wt 181.0 lb

## 2023-08-14 DIAGNOSIS — G4733 Obstructive sleep apnea (adult) (pediatric): Secondary | ICD-10-CM | POA: Diagnosis not present

## 2023-08-14 DIAGNOSIS — Z7901 Long term (current) use of anticoagulants: Secondary | ICD-10-CM | POA: Diagnosis not present

## 2023-08-14 DIAGNOSIS — R42 Dizziness and giddiness: Secondary | ICD-10-CM

## 2023-08-14 DIAGNOSIS — J849 Interstitial pulmonary disease, unspecified: Secondary | ICD-10-CM | POA: Diagnosis not present

## 2023-08-14 NOTE — Patient Instructions (Signed)
 Good to meet you.  Schedule MRI brain with and without contrast at [facility]  2. Schedule 1-hour EEG  3. Our office will call with results, if normal, continue follow-up with Cardiology

## 2023-08-14 NOTE — Progress Notes (Signed)
 Shane Massey    [id number]    09/29/1940  Primary Care Physician:Hodges, Para March, MD  Referring Physician: Charlott Rakes, MD [address] [place],  [place] [address]  Chief complaint: Follow-up for ILD  HPI: 83 y.o.  with history of cardiomyopathy, OSA, GERD Here for evaluation of interstitial lung disease Complains of chronic dyspnea on exertion.  Has to stop after walking about 200 feet.  No symptoms at rest Denies any cough, sputum production, chest congestion or wheezing He had a high-res CT showing mild interstitial changes which are being monitored.  Previously tried on ICS/LABA inhaler with no improvement in symptoms  History also significant for ischemic cardiomyopathy for which she follows with Dr. Bing Matter.  Recent echocardiogram showed EF of 40-45% and losartan added instead of lisinopril OSA for which he is on BiPAP, managed by Dr. 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  Reports exposure to asbestos while working in the [employer] Exposures: Asbestos exposure as above.  No mold, hot tub, Jacuzzi.  No feather pillows or comforter Smoking history: Minimal smoking as a teenager Travel history: No significant travel Relevant family history: No family history of lung disease  Interim history: Discussed the use of AI scribe software for clinical note transcription with the patient, who gave verbal consent to proceed.  Shane Massey is a 83 year old male with interstitial lung disease who presents for follow-up.  He is being followed for interstitial lung disease, characterized by mild inflammation or scarring of  the lung. A CT scan performed a few months ago showed that the condition has not worsened. He reports no shortness of breath and feels he is 'doing pretty good'.  He is currently using Trelegy inhaler, although he forgets it 'a lot of the time'.  He is on a blood thinner for a previous blood clot.   Outpatient Encounter Medications as of 08/14/2023  Medication Sig   apixaban (ELIQUIS) 5 MG TABS tablet Take 1 tablet (5 mg total) by mouth 2 (two) times daily.   Calcium Carb-Cholecalciferol (CALCIUM 600+D3 PO) Take 1 tablet by mouth daily at 12 noon.   clopidogrel (PLAVIX) 75 MG tablet Take 1 tablet by mouth once daily   dexlansoprazole (DEXILANT) 60 MG capsule Take 60 mg by mouth daily before breakfast.   furosemide (LASIX) 40 MG tablet Take 1.5 tablets (60 mg total) by mouth 2 (two) times daily.   isosorbide mononitrate (IMDUR) 120 MG 24 hr tablet Take 1 tablet (120 mg total) by mouth daily.   levothyroxine (SYNTHROID) 75 MCG tablet Take 75 mcg by mouth daily before breakfast.   mexiletine (MEXITIL) 250 MG capsule Take 1 capsule by mouth twice daily   Multiple Vitamin (MULTIVITAMIN WITH MINERALS) TABS tablet Take 1 tablet by mouth daily at 12 noon. Unknown strength per patient   nitroGLYCERIN (NITROSTAT) 0.4 MG SL tablet Place 1 tablet (0.4 mg total) under the tongue every 5 (five) minutes as needed for chest pain.   potassium chloride (KLOR-CON) 10 MEQ tablet Take 1 tablet by mouth once daily (Patient taking differently: Take 10 mEq by mouth daily.)   pramipexole (  MIRAPEX) 1.5 MG tablet Take 1.5 mg by mouth at bedtime.    rosuvastatin (CRESTOR) 20 MG tablet Take 1 tablet (20 mg total) by mouth daily.   sacubitril-valsartan (ENTRESTO) 24-26 MG Take 1 tablet by mouth 2 (two) times daily.   TRELEGY ELLIPTA 100-62.5-25 MCG/ACT AEPB Inhale 1 puff into the lungs daily.   [DISCONTINUED] apixaban (ELIQUIS) 5 MG TABS tablet Take 1 tablet (5 mg total) by mouth 2 (two) times daily.   No  facility-administered encounter medications on file as of 08/14/2023.   Physical Exam: Blood pressure 132/62, pulse (!) 52, height 5\' 10"  (1.778 m), weight 181 lb (82.1 kg), SpO2 97%. Gen:      No acute distress HEENT:  EOMI, sclera anicteric Neck:     No masses; no thyromegaly Lungs:    Clear to auscultation bilaterally; normal respiratory effort CV:         Regular rate and rhythm; no murmurs Abd:      + bowel sounds; soft, non-tender; no palpable masses, no distension Ext:    No edema; adequate peripheral perfusion Skin:      Warm and dry; no rash Neuro: alert and oriented x 3 Psych: normal mood and affect   Data Reviewed: Imaging: CT chest [facility] 08/12/2015-bilateral interstitial changes with reticulation and groundglass opacities  High-res CT 03/14/2020- Patchy confluent mild-to-moderate subpleural reticulation and ground-glass opacity in both lungs with associated minimal traction bronchiolectasis and architectural distortion.  Alternate diagnosis.  No progression since 2017  High-res CT 05/11/2021-redemonstration of pulmonary fibrosis and alternate pattern.  High-res CT 04/19/2023-stable mild fibrotic changes in alternate pattern, resolved pulmonary nodules, aortic, coronary atherosclerosis I have reviewed the images personally  PFTs: 03/18/2020 FVC 2.80 [61%], FEV1 2.37 [79%], F/F 85, TLC 4.70 (66%), DLCO 18.80 [75%] Mild restriction diffusion impairment  09/07/2021 FVC 2.47 (62%], FEV1 2.04 [72%], TLC 4.58 [6%], DLCO 15.03 [62%] Moderate restriction, diffusion impairment  10/19/2022 FVC 2.78 [69%], FEV1 2.39 [83%], F/F86, TLC 4.52 [63%], DLCO 17.63 [102%]  Labs: CTD serologies 04/19/2021-significant for ANA 1:40  Cardiac: Echocardiogram 04/11/2021- EF 40-45%.  Normal RV systolic size and function.  Normal PA systolic pressure  Assessment:  Interstitial Lung Disease Etiology of ILD is not clear but he does have some asbestos exposure in the past.  There are no signs and  symptoms of connective tissue disease and ANA is borderline elevated Differential diagnosis at this point is IPF versus asbestosis.  He is not a candidate for lung biopsy  We had a long discussion about antifibrotic therapy with patient and his wife.  They do not want to go ahead with treatment due to the side effects involved with therapy.   Latest CT scan from November 2024 reviewed showing stability which is encouraging PFTs from 2024 reviewed which are better than 2023 and over a longer time period since 2021 they have been stable.  Will continue monitoring Trelegy seems to be helping with his breathing and will continue the same   Blood Clot On anticoagulant therapy. No new issues or complications. - Continue anticoagulant as prescribed  Follow-up - Schedule follow-up visit in 1 year - Order CT scan prior to follow-up visit.       Plan/Recommendations: High-res CT in 1 year Continue Trelegy  Chilton Greathouse MD [facility] 08/14/2023, 11:19 AM  CC: Charlott Rakes, MD

## 2023-08-14 NOTE — Progress Notes (Signed)
 NEUROLOGY CONSULTATION NOTE  Shane Massey MRN: [id number] DOB: [birth year]  Referring provider: Carilyn Goodpasture, PA-C Primary care provider: Dr. Charlott Rakes  Reason for consult:  weakness/syncope/?seizures  Thank you for your kind referral of Shane Massey for consultation of the above symptoms. Although his history is well known to you, please allow me to reiterate it for the purpose of our medical record. Records and images were personally reviewed where available.   HISTORY OF PRESENT ILLNESS: This is an 83 year old right-handed man with a history of hyperlipidemia, CAD, cardiomyopathy, non-sustained VT, PE, paroxysmal afib, OSA on CPAP, ILD, presenting for evaluation of weakness/syncope/?seizures. He reports symptoms started around January, he was sitting and fell off a stool. He was not sure what happened, he was cleaning and tore his desk apart, he bent down to get something on the floor and felt like the stool slid out however it was against the wall. His head went forward, he denies any loss of consciousness. He felt like he was going forward and could not stop himself. He lay there for half a minute then got up with no focal symptoms. Since then, he has felt dizzy/lightheaded, "like a passing thing." He would get up, start to move and would wobble, having to stay still for a minute. No falls. There is a constant sensation like his head is swimming/woozy. When he is laying down it feels like everything will keep rolling over for a just a moment. Symptoms are worse when walking. No staring/unresponsiveness. Sometimes he thinks that something "stinks." No rising epigastric sensation, loss of time, focal numbness/tingling/weakness, myoclonic jerks. Sometimes he is a little fidgety or tremors, his wife has not noticed this. For the past couple of weeks, he would eat and vomit. GERD is pretty bad. For the past year, he has had occasional headaches feeling like something is on top of his  head. Sometimes it can be daily, other times it does not bother him. He rarely takes Tylenol which does help. No diplopia, dysarthria but notes his throat is giving out, he coughs a lot and voice has changed. He gets 6 hours of interrupted sleep with his CPAP machine, he endorses daytime drowsiness.   He had a normal birth and early development.  There is no history of febrile convulsions, CNS infections such as meningitis/encephalitis, significant traumatic brain injury, neurosurgical procedures, or family history of seizures.   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   Ischemic cardiomyopathy 10/24/2016   Overview:  Ejection fraction 45% in spring 2018   Myocardial infarction [facility])    Near syncope    Nonsustained ventricular tachycardia (HCC) 07/17/2018   OSA (obstructive sleep apnea)    OSA on CPAP 11/14/2015   CPAP 13    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   Paroxysmal atrial fibrillation (HCC) 09/09/2019   Pulmonary embolism (HCC)    Restless leg syndrome 11/30/2016   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    PAST SURGICAL HISTORY: Past Surgical History:  Procedure Laterality Date   CATARACT EXTRACTION  2012   COLONOSCOPY  2013   CORONARY ARTERY BYPASS GRAFT N/A 08/13/2019   Procedure: CORONARY ARTERY BYPASS GRAFTING (CABG) TIMES 3.;  Surgeon: Linden Dolin, MD;  Location: [facility] OR;  Service: Open Heart Surgery;  Laterality: N/A;   CORONARY STENT PLACEMENT     heart stent Left 09/2015   HERNIA REPAIR  11/1977   LEFT HEART CATH AND CORONARY  ANGIOGRAPHY N/A 08/06/2019   Procedure: LEFT HEART CATH AND CORONARY ANGIOGRAPHY;  Surgeon: Lennette Bihari, MD;  Location: [facility] INVASIVE CV LAB;  Service: Cardiovascular;  Laterality: N/A;   LEFT HEART CATH AND CORS/GRAFTS ANGIOGRAPHY N/A 04/30/2023   Procedure: LEFT HEART CATH AND CORS/GRAFTS ANGIOGRAPHY;  Surgeon: Orbie Pyo, MD;  Location: [facility] INVASIVE CV LAB;  Service: Cardiovascular;  Laterality: N/A;   RADIAL ARTERY HARVEST Left 08/13/2019   Procedure: OPEN RADIAL ARTERY HARVEST;  Surgeon: Linden Dolin, MD;  Location: [facility] OR;  Service: Open Heart Surgery;  Laterality: Left;   REPAIR OF PATENT FORAMEN OVALE N/A 08/13/2019   Procedure: CLOSURE OF PATENT FORAMEN OVALE;  Surgeon: Linden Dolin, MD;  Location: [facility] OR;  Service: Open Heart Surgery;  Laterality: N/A;   RTC repair  Right 11/03/2020   SURGERY FOR BOWEL ABSCESS  10/1959   TEE WITHOUT CARDIOVERSION N/A 08/13/2019   Procedure: TRANSESOPHAGEAL ECHOCARDIOGRAM (TEE);  Surgeon: Linden Dolin, MD;  Location[facility] OR;  Service: Open Heart Surgery;  Laterality: N/A;    MEDICATIONS: Current Outpatient Medications on File Prior to Visit  Medication Sig Dispense Refill   apixaban (ELIQUIS) 5 MG TABS tablet Take 1 tablet (5 mg total) by mouth 2 (two) times daily. 180 tablet 0   apixaban (ELIQUIS) 5 MG TABS tablet Take 1 tablet (5 mg total) by mouth 2 (two) times daily. 42 tablet 0   Calcium Carb-Cholecalciferol (CALCIUM 600+D3 PO) Take 1 tablet by mouth daily at 12 noon.     clopidogrel (PLAVIX) 75 MG tablet Take 1 tablet by mouth once daily 90 tablet 0   dexlansoprazole (DEXILANT) 60 MG capsule Take 60 mg by mouth daily before breakfast.     furosemide (LASIX) 40 MG tablet Take 1.5 tablets (60 mg total) by mouth 2 (two) times daily. 135 tablet 3   isosorbide mononitrate (IMDUR) 120 MG 24 hr tablet Take 1 tablet (120 mg total) by mouth daily. 90 tablet 3   levothyroxine (SYNTHROID) 75 MCG tablet Take 75 mcg by mouth daily before  breakfast.     mexiletine (MEXITIL) 250 MG capsule Take 1 capsule by mouth twice daily 180 capsule 0   Multiple Vitamin (MULTIVITAMIN WITH MINERALS) TABS tablet Take 1 tablet by mouth daily at 12 noon. Unknown strength per patient     nitroGLYCERIN (NITROSTAT) 0.4 MG SL tablet Place 1 tablet (0.4 mg total) under the tongue every 5 (five) minutes as needed for chest pain. 25 tablet 3   potassium chloride (KLOR-CON) 10 MEQ tablet Take 1 tablet by mouth once daily (Patient taking differently: Take 10 mEq by mouth daily.) 90 tablet 2   pramipexole (MIRAPEX) 1.5 MG tablet Take 1.5 mg by mouth at bedtime.      rosuvastatin (CRESTOR) 20 MG tablet Take 1 tablet (20 mg total) by mouth daily. 90 tablet 3   sacubitril-valsartan (ENTRESTO) 24-26 MG Take 1 tablet by mouth 2 (two) times daily. 60 tablet 3   TRELEGY ELLIPTA 100-62.5-25 MCG/ACT AEPB Inhale 1 puff into the lungs daily. 60 each 11   No current facility-administered medications  on file prior to visit.    ALLERGIES: No Known Allergies  FAMILY HISTORY: Family History  Problem Relation Age of Onset   Stroke Father    Asthma Brother    Heart disease Brother    Heart attack Brother    Asthma Maternal Grandmother     SOCIAL HISTORY: Social History   Socioeconomic History   Marital status: Married    Spouse name: Not on file   Number of children: Not on file   Years of education: Not on file   Highest education level: Not on file  Occupational History   Not on file  Tobacco Use   Smoking status: Former    Current packs/day: 0.00    Average packs/day: 0.5 packs/day for 4.0 years (2.0 ttl pk-yrs)    Types: Cigarettes    Start date: 06/11/1954    Quit date: 06/11/1958    Years since quitting: 65.2   Smokeless tobacco: Former    Types: Chew    Quit date: 12/01/1958  Vaping Use   Vaping status: Never Used  Substance and Sexual Activity   Alcohol use: No    Alcohol/week: 0.0 standard drinks of alcohol   Drug use: No   Sexual  activity: Not on file  Other Topics Concern   Not on file  Social History Narrative   Are you right handed or left handed? Right    Are you currently employed ? Retired    What is your current occupation?   Do you live at home alone?no    Who lives with you? spouse   What type of home do you live in: 1 story or 2 story? 1 story        Social Drivers of Corporate investment banker Strain: Not on file  Food Insecurity: Not on file  Transportation Needs: Not on file  Physical Activity: Not on file  Stress: Not on file  Social Connections: Not on file  Intimate Partner Violence: Not on file     PHYSICAL EXAM: Vitals:   08/14/23 0857  BP: (!) 131/46  Pulse: 69  SpO2: 98%   General: No acute distress Head:  Normocephalic/atraumatic Skin/Extremities: No rash, no edema Neurological Exam: Mental status: alert and awake, no dysarthria or aphasia, Fund of knowledge is appropriate.  Attention and concentration are normal.   Cranial nerves: CN I: not tested CN II: pupils equal, round, visual fields intact CN III, IV, VI:  full range of motion, no nystagmus, no ptosis CN V: facial sensation intact CN VII: upper and lower face symmetric CN VIII: hearing intact to conversation Bulk & Tone: normal, no fasciculations, no cogwheeling Motor: 5/5 throughout with no pronator drift. Sensation: intact to light touch, cold, pin on both UE and LE, decreased vibration sense to right knee.  No extinction to double simultaneous stimulation.  Romberg test negative Deep Tendon Reflexes: +1 throughout except for +2 right UE Cerebellar: no incoordination on finger to nose testing Gait: slow and cautious, left leg bent at the knee, favoring left leg. No ataxia Tremor: none   IMPRESSION: This is an 83 year old right-handed man with a history of hyperlipidemia, CAD, cardiomyopathy, non-sustained VT, PE, paroxysmal afib, OSA on CPAP, ILD, presenting for evaluation of weakness/syncope/?seizures. No  clear epilepsy risk factors. Neurological exam largely non-focal except for chronic gait changes. He describes a constant sensation of dizziness and denies any loss of consciousness. Etiology unclear. From a neurological standpoint, MRI brain with and without contrast and 1-hour  EEG will be ordered. Our office will call with results, if normal, recommend continued Cardiology follow-up. He knows to call for any changes.   Thank you for allowing me to participate in the care of this patient. Please do not hesitate to call for any questions or concerns.   Shane Massey, M.D.  CC: Carilyn Goodpasture, PA-C, Dr. Yetta Flock

## 2023-08-14 NOTE — Patient Instructions (Signed)
 VISIT SUMMARY:  Shane Massey, an 83 year old male, visited for a follow-up on his interstitial lung disease. He reports no shortness of breath and feels he is doing well. A recent CT scan showed no worsening of his condition. He is also being treated for COPD and is on a blood thinner for a previous blood clot.  YOUR PLAN:  -INTERSTITIAL LUNG DISEASE: Interstitial lung disease involves mild inflammation or scarring of the lungs. Your recent CT scan shows that the condition has not worsened. Since you are not experiencing any symptoms, we will continue to monitor your condition without active treatment. We will order another CT scan in one year to check for any changes.  -CHRONIC OBSTRUCTIVE PULMONARY DISEASE (COPD): COPD is a chronic lung disease that makes it hard to breathe. You are currently using the Trelegy inhaler to manage this condition, but you mentioned that you often forget to use it. It is important to use the inhaler as prescribed to help manage your symptoms and prevent worsening of the disease.  -BLOOD CLOT: You are on a blood thinner to prevent complications from a previous blood clot. There are no new issues or complications, so you should continue taking the anticoagulant as prescribed.  INSTRUCTIONS:  Please schedule a follow-up visit in one year. Additionally, make sure to get a CT scan before your next visit to monitor your interstitial lung disease.  For more information, you can read your full clinical note, available in your patient portal.

## 2023-08-15 ENCOUNTER — Telehealth: Payer: Self-pay

## 2023-08-15 NOTE — Telephone Encounter (Signed)
 Attempted to call the patient, line was busy. I sent a message through my chart with results.

## 2023-08-15 NOTE — Telephone Encounter (Signed)
-----   Message from [facility] sent at 08/09/2023  1:32 PM EST ----- Normal or stable result

## 2023-08-20 ENCOUNTER — Ambulatory Visit: Admitting: Neurology

## 2023-08-20 DIAGNOSIS — R42 Dizziness and giddiness: Secondary | ICD-10-CM | POA: Diagnosis not present

## 2023-08-20 NOTE — Progress Notes (Unsigned)
 EEG complete - results pending

## 2023-08-22 DIAGNOSIS — L578 Other skin changes due to chronic exposure to nonionizing radiation: Secondary | ICD-10-CM | POA: Diagnosis not present

## 2023-08-22 DIAGNOSIS — L821 Other seborrheic keratosis: Secondary | ICD-10-CM | POA: Diagnosis not present

## 2023-08-22 DIAGNOSIS — C44629 Squamous cell carcinoma of skin of left upper limb, including shoulder: Secondary | ICD-10-CM | POA: Diagnosis not present

## 2023-08-22 DIAGNOSIS — L570 Actinic keratosis: Secondary | ICD-10-CM | POA: Diagnosis not present

## 2023-08-27 NOTE — Addendum Note (Signed)
 Addended by: Van Clines on: 08/27/2023 12:59 PM   Modules accepted: Orders

## 2023-08-27 NOTE — Procedures (Signed)
 ELECTROENCEPHALOGRAM REPORT  Date of Study: 08/20/2022  Patient's Name: Shane Massey MRN: [id number] Date of Birth: November 22, 1940  Referring Provider: Dr. Patrcia Dolly  Clinical History: This is an 83 year old man with recurrent dizziness with fall. EEG for classification.  CNS Active Medications: Pramipexole  Technical Summary: A multichannel digital 1-hour EEG recording measured by the international 10-20 system with electrodes applied with paste and impedances below 5000 ohms performed in our laboratory with EKG monitoring in an awake and asleep patient.  Hyperventilation was not performed. Photic stimulation was performed.  The digital EEG was referentially recorded, reformatted, and digitally filtered in a variety of bipolar and referential montages for optimal display.    Description: The patient is awake and asleep during the recording.  During maximal wakefulness, there is a symmetric, medium voltage 10 Hz posterior dominant rhythm that attenuates with eye opening.  The record is symmetric.  During drowsiness and sleep, there is an increase in theta slowing of the background.  Vertex waves and symmetric sleep spindles were seen. Photic stimulation did not elicit any abnormalities.  There were no epileptiform discharges or electrographic seizures seen.    EKG lead showed sinus bradycardia at 60 bpm with frequent extrasystolic beats.   Impression: This 1-hour awake and asleep EEG is normal.    Clinical Correlation: A normal EEG does not exclude a clinical diagnosis of epilepsy.  If further clinical questions remain, prolonged EEG may be helpful.  Clinical correlation is advised.   Patrcia Dolly, M.D.

## 2023-08-30 DIAGNOSIS — G4733 Obstructive sleep apnea (adult) (pediatric): Secondary | ICD-10-CM | POA: Diagnosis not present

## 2023-09-04 DIAGNOSIS — Z20822 Contact with and (suspected) exposure to covid-19: Secondary | ICD-10-CM | POA: Diagnosis not present

## 2023-09-04 DIAGNOSIS — Z6827 Body mass index (BMI) 27.0-27.9, adult: Secondary | ICD-10-CM | POA: Diagnosis not present

## 2023-09-04 DIAGNOSIS — J189 Pneumonia, unspecified organism: Secondary | ICD-10-CM | POA: Diagnosis not present

## 2023-09-04 DIAGNOSIS — R059 Cough, unspecified: Secondary | ICD-10-CM | POA: Diagnosis not present

## 2023-09-09 ENCOUNTER — Telehealth: Payer: Self-pay

## 2023-09-09 NOTE — Telephone Encounter (Signed)
 Pt called an informed that  EEG is normal, proceed with brain MRI as discussed,

## 2023-09-09 NOTE — Telephone Encounter (Signed)
-----   Message from Van Clines sent at 09/05/2023  3:29 PM EDT ----- Pls let him know the EEG is normal, proceed with brain MRI as discussed, thanks

## 2023-09-10 DIAGNOSIS — E785 Hyperlipidemia, unspecified: Secondary | ICD-10-CM | POA: Diagnosis not present

## 2023-09-10 DIAGNOSIS — R7303 Prediabetes: Secondary | ICD-10-CM | POA: Diagnosis not present

## 2023-09-12 DIAGNOSIS — E669 Obesity, unspecified: Secondary | ICD-10-CM | POA: Diagnosis not present

## 2023-09-12 DIAGNOSIS — Z0000 Encounter for general adult medical examination without abnormal findings: Secondary | ICD-10-CM | POA: Diagnosis not present

## 2023-09-12 DIAGNOSIS — Z139 Encounter for screening, unspecified: Secondary | ICD-10-CM | POA: Diagnosis not present

## 2023-09-12 DIAGNOSIS — Z136 Encounter for screening for cardiovascular disorders: Secondary | ICD-10-CM | POA: Diagnosis not present

## 2023-09-12 DIAGNOSIS — Z1331 Encounter for screening for depression: Secondary | ICD-10-CM | POA: Diagnosis not present

## 2023-09-12 DIAGNOSIS — Z1339 Encounter for screening examination for other mental health and behavioral disorders: Secondary | ICD-10-CM | POA: Diagnosis not present

## 2023-09-16 ENCOUNTER — Ambulatory Visit: Payer: PPO | Attending: Cardiology | Admitting: Cardiology

## 2023-09-16 ENCOUNTER — Encounter: Payer: Self-pay | Admitting: Cardiology

## 2023-09-16 ENCOUNTER — Other Ambulatory Visit: Payer: Self-pay

## 2023-09-16 ENCOUNTER — Ambulatory Visit (INDEPENDENT_AMBULATORY_CARE_PROVIDER_SITE_OTHER)

## 2023-09-16 VITALS — BP 112/50 | HR 68 | Ht 70.0 in | Wt 181.2 lb

## 2023-09-16 DIAGNOSIS — I4729 Other ventricular tachycardia: Secondary | ICD-10-CM | POA: Diagnosis not present

## 2023-09-16 DIAGNOSIS — I5022 Chronic systolic (congestive) heart failure: Secondary | ICD-10-CM | POA: Diagnosis not present

## 2023-09-16 DIAGNOSIS — I493 Ventricular premature depolarization: Secondary | ICD-10-CM

## 2023-09-16 DIAGNOSIS — I480 Paroxysmal atrial fibrillation: Secondary | ICD-10-CM | POA: Diagnosis not present

## 2023-09-16 DIAGNOSIS — D6869 Other thrombophilia: Secondary | ICD-10-CM | POA: Diagnosis not present

## 2023-09-16 DIAGNOSIS — I10 Essential (primary) hypertension: Secondary | ICD-10-CM

## 2023-09-16 DIAGNOSIS — I2510 Atherosclerotic heart disease of native coronary artery without angina pectoris: Secondary | ICD-10-CM | POA: Diagnosis not present

## 2023-09-16 MED ORDER — ISOSORBIDE MONONITRATE ER 60 MG PO TB24: 60.0000 mg | ORAL_TABLET | Freq: Every day | ORAL | 6 refills | Status: DC

## 2023-09-16 MED ORDER — ENTRESTO 24-26 MG PO TABS: 1.0000 | ORAL_TABLET | Freq: Two times a day (BID) | ORAL | 3 refills | Status: DC

## 2023-09-16 NOTE — Patient Instructions (Addendum)
 Medication Instructions:  Your physician has recommended you make the following change in your medication:  DECREASE your Imdur to 60 mg daily  *If you need a refill on your cardiac medications before your next appointment, please call your pharmacy*   Lab Work: None ordered   Testing/Procedures:                          ZIO XT- Long Term Monitor Instructions  Your physician has requested you wear a ZIO patch monitor for 14 days.  This is a single patch monitor. [employer] supplies one patch monitor per enrollment. Additional stickers are not available. Please do not apply patch if you will be having a Nuclear Stress Test,  Echocardiogram, Cardiac CT, MRI, or Chest Xray during the period you would be wearing the  monitor. The patch cannot be worn during these tests. You cannot remove and re-apply the  ZIO XT patch monitor.  Your ZIO patch monitor will be mailed 3 day USPS to your address on file. It may take 3-5 days  to receive your monitor after you have been enrolled.  Once you have received your monitor, please review the enclosed instructions. Your monitor  has already been registered assigning a specific monitor serial # to you.  Billing and Patient Assistance Program Information  We have supplied [employer] with any of your insurance information on file for billing purposes. [employer] offers a sliding scale Patient Assistance Program for patients that do not have  insurance, or whose insurance does not completely cover the cost of the ZIO monitor.  You must apply for the Patient Assistance Program to qualify for this discounted rate.  To apply, please call [employer] at [phone], select option 4, select option 2, ask to apply for  Patient Assistance Program. Meredeth Ide will ask your household income, and how many people  are in your household. They will quote your out-of-pocket cost based on that information.  [employer] will also be able to set up a 15-month, interest-free payment plan  if needed.  Applying the monitor   Shave hair from upper left chest.  Hold abrader disc by orange tab. Rub abrader in 40 strokes over the upper left chest as  indicated in your monitor instructions.  Clean area with 4 enclosed alcohol pads. Let dry.  Apply patch as indicated in monitor instructions. Patch will be placed under collarbone on left  side of chest with arrow pointing upward.  Rub patch adhesive wings for 2 minutes. Remove white label marked "1". Remove the white  label marked "2". Rub patch adhesive wings for 2 additional minutes.  While looking in a mirror, press and release button in center of patch. A small green light will  flash 3-4 times. This will be your only indicator that the monitor has been turned on.  Do not shower for the first 24 hours. You may shower after the first 24 hours.  Press the button if you feel a symptom. You will hear a small click. Record Date, Time and  Symptom in the Patient Logbook.  When you are ready to remove the patch, follow instructions on the last 2 pages of Patient  Logbook. Stick patch monitor onto the last page of Patient Logbook.  Place Patient Logbook in the blue and white box. Use locking tab on box and tape box closed  securely. The blue and white box has prepaid postage on it. Please place it in the mailbox as  soon  as possible. Your physician should have your test results approximately 7 days after the  monitor has been mailed back to [facility].  Call [facility] Customer Care at [phone] if you have questions regarding  your ZIO XT patch monitor. Call them immediately if you see an orange light blinking on your  monitor.  If your monitor falls off in less than 4 days, contact our Monitor department at [phone].  If your monitor becomes loose or falls off after 4 days call [employer] at [phone] for  suggestions on securing your monitor    Follow-Up: At [facility], you and your health needs are our  priority.  As part of our continuing mission to provide you with exceptional heart care, we have created designated Provider Care Teams.  These Care Teams include your primary Cardiologist (physician) and Advanced Practice Providers (APPs -  Physician Assistants and Nurse Practitioners) who all work together to provide you with the care you need, when you need it.  Your next appointment:   1 year(s)  The format for your next appointment:   In Person  Provider:   Loman Brooklyn, MD    Thank you for choosing [facility]!!   Dory Horn, RN [phone]

## 2023-09-16 NOTE — Progress Notes (Signed)
 Electrophysiology Office Note:   Date:  09/16/2023  ID:  Shane Massey, DOB [birth year], MRN [id number]  Primary Cardiologist: Gypsy Balsam, MD Primary Heart Failure: None Electrophysiologist: [name], MD      History of Present Illness:   Shane Massey is a 83 y.o. male with h/o coronary artery disease, chronic systolic heart failure, sleep apnea, atrial fibrillation, hypertension, hyperlipidemia seen today for routine electrophysiology followup.   Since last being seen in our clinic the patient reports fatigue and dizziness.  His Imdur was recently increased.  He continues to try and do his daily activities, but he has orthostatic type dizziness.  He is dizzy when he stands up or has recently stood up and tries to walk across the room.  He has had no chest pain.  He is not short of breath, but does have significant fatigue.  He has follow-up with his sleep doctor to discuss his CPAP..  he denies chest pain, palpitations, dyspnea, PND, orthopnea, nausea, vomiting, dizziness, syncope, edema, weight gain, or early satiety.   Review of systems complete and found to be negative unless listed in HPI.   EP Information / Studies Reviewed:    EKG is ordered today. Personal review as below.  EKG Interpretation Date/Time:  Monday September 16 2023 11:41:51 EDT Ventricular Rate:  68 PR Interval:  146 QRS Duration:  96 QT Interval:  426 QTC Calculation: 452 R Axis:   -33  Text Interpretation: Sinus rhythm with frequent Premature ventricular complexes in a pattern of bigeminy Left axis deviation ST & T wave abnormality, consider lateral ischemia Abnormal ECG When compared with ECG of 22-Apr-2023 10:41, Premature ventricular complexes are now Present Confirmed by [name] ([id number]) on 09/16/2023 11:49:39 AM     Risk Assessment/Calculations:    CHA2DS2-VASc Score = 5   This indicates a 7.2% annual risk of stroke. The patient's score is based upon: CHF History: 1 HTN History:  1 Diabetes History: 0 Stroke History: 0 Vascular Disease History: 1 Age Score: 2 Gender Score: 0            Physical Exam:   VS:  BP (!) 112/50   Pulse 68   Ht 5\' 10"  (1.778 m)   Wt 181 lb 3.2 oz (82.2 kg)   SpO2 96%   BMI 26.00 kg/m    Wt Readings from Last 3 Encounters:  09/16/23 181 lb 3.2 oz (82.2 kg)  08/14/23 181 lb (82.1 kg)  08/14/23 182 lb 3.2 oz (82.6 kg)     GEN: Well nourished, well developed in no acute distress NECK: No JVD; No carotid bruits CARDIAC: Regular rate and rhythm with occasional ectopy, no murmurs, rubs, gallops RESPIRATORY:  Clear to auscultation without rales, wheezing or rhonchi  ABDOMEN: Soft, non-tender, non-distended EXTREMITIES:  No edema; No deformity   ASSESSMENT AND PLAN:    1.  Ventricular tachycardia: Currently on mexiletine 250 mg twice daily.  Had episodes on his cardiac monitor of VT lasting up to 45 seconds.  He is in ventricular bigeminy today.  I am concerned that some of his fatigue may be related to PVCs.  [name] have him wear a 2-week monitor.  2.  Coronary artery disease: Post CABG x 3.  Intermittent chest pain controlled with nitroglycerin and Imdur.  Continue plan per primary cardiology.  3.  Interstitial lung disease: Followed by internal medicine.  Hoping to avoid amiodarone.  4.  Paroxysmal atrial fibrillation: Minimal episodes.  5.  Secondary hypercoagulable state: Currently on  Eliquis for atrial fibrillation  6.  Dizziness: Patient has orthostatic symptoms.  [name] reduce Imdur to 60 mg daily.  Discussed with primary cardiology  Follow up with Dr. [name] in 12 months  Signed, Katora Fini [name], MD

## 2023-09-16 NOTE — Progress Notes (Unsigned)
 Enrolled for [employer] to mail a ZIO XT long term holter monitor to the patients address on file.

## 2023-10-06 ENCOUNTER — Other Ambulatory Visit: Payer: Self-pay | Admitting: Cardiology

## 2023-10-08 DIAGNOSIS — G4733 Obstructive sleep apnea (adult) (pediatric): Secondary | ICD-10-CM | POA: Diagnosis not present

## 2023-10-09 DIAGNOSIS — I493 Ventricular premature depolarization: Secondary | ICD-10-CM | POA: Diagnosis not present

## 2023-10-10 DIAGNOSIS — R7303 Prediabetes: Secondary | ICD-10-CM | POA: Diagnosis not present

## 2023-10-10 DIAGNOSIS — E785 Hyperlipidemia, unspecified: Secondary | ICD-10-CM | POA: Diagnosis not present

## 2023-10-10 DIAGNOSIS — I493 Ventricular premature depolarization: Secondary | ICD-10-CM | POA: Diagnosis not present

## 2023-10-16 DIAGNOSIS — H26492 Other secondary cataract, left eye: Secondary | ICD-10-CM | POA: Diagnosis not present

## 2023-10-16 DIAGNOSIS — H04123 Dry eye syndrome of bilateral lacrimal glands: Secondary | ICD-10-CM | POA: Diagnosis not present

## 2023-10-16 DIAGNOSIS — H43393 Other vitreous opacities, bilateral: Secondary | ICD-10-CM | POA: Diagnosis not present

## 2023-10-16 DIAGNOSIS — Z961 Presence of intraocular lens: Secondary | ICD-10-CM | POA: Diagnosis not present

## 2023-10-16 DIAGNOSIS — H524 Presbyopia: Secondary | ICD-10-CM | POA: Diagnosis not present

## 2023-10-16 DIAGNOSIS — H02402 Unspecified ptosis of left eyelid: Secondary | ICD-10-CM | POA: Diagnosis not present

## 2023-10-21 DIAGNOSIS — C44319 Basal cell carcinoma of skin of other parts of face: Secondary | ICD-10-CM | POA: Diagnosis not present

## 2023-10-21 DIAGNOSIS — L821 Other seborrheic keratosis: Secondary | ICD-10-CM | POA: Diagnosis not present

## 2023-10-21 DIAGNOSIS — L578 Other skin changes due to chronic exposure to nonionizing radiation: Secondary | ICD-10-CM | POA: Diagnosis not present

## 2023-10-21 DIAGNOSIS — L570 Actinic keratosis: Secondary | ICD-10-CM | POA: Diagnosis not present

## 2023-10-24 ENCOUNTER — Ambulatory Visit: Payer: Self-pay | Admitting: Cardiology

## 2023-10-25 DIAGNOSIS — C44329 Squamous cell carcinoma of skin of other parts of face: Secondary | ICD-10-CM | POA: Diagnosis not present

## 2023-10-29 ENCOUNTER — Other Ambulatory Visit: Payer: Self-pay | Admitting: Cardiology

## 2023-11-07 NOTE — Telephone Encounter (Signed)
 Pt spouse returning call.

## 2023-11-08 NOTE — Telephone Encounter (Signed)
 Explained to wife monitor findings and MD recommendation.  Aware I will send information on  the medication via mychart and further explained why he would need to be admitted for medication loading.  They will take a look at the information over the weekend and also would like to further discuss with Dr. Krasowski at their appt in 2 weeks.  Will notify Camnitz of plan.  Wife appreciates my return call.

## 2023-11-10 ENCOUNTER — Other Ambulatory Visit: Payer: Self-pay | Admitting: Cardiology

## 2023-11-10 DIAGNOSIS — E785 Hyperlipidemia, unspecified: Secondary | ICD-10-CM | POA: Diagnosis not present

## 2023-11-10 DIAGNOSIS — I10 Essential (primary) hypertension: Secondary | ICD-10-CM | POA: Diagnosis not present

## 2023-11-21 DIAGNOSIS — G4734 Idiopathic sleep related nonobstructive alveolar hypoventilation: Secondary | ICD-10-CM | POA: Insufficient documentation

## 2023-11-21 HISTORY — DX: Idiopathic sleep related nonobstructive alveolar hypoventilation: G47.34

## 2023-11-22 ENCOUNTER — Encounter: Payer: Self-pay | Admitting: Cardiology

## 2023-11-22 ENCOUNTER — Ambulatory Visit: Attending: Cardiology | Admitting: Cardiology

## 2023-11-22 VITALS — BP 102/40 | HR 56 | Ht 70.0 in | Wt 177.8 lb

## 2023-11-22 DIAGNOSIS — Z951 Presence of aortocoronary bypass graft: Secondary | ICD-10-CM | POA: Diagnosis not present

## 2023-11-22 DIAGNOSIS — I25709 Atherosclerosis of coronary artery bypass graft(s), unspecified, with unspecified angina pectoris: Secondary | ICD-10-CM | POA: Diagnosis not present

## 2023-11-22 DIAGNOSIS — I480 Paroxysmal atrial fibrillation: Secondary | ICD-10-CM | POA: Diagnosis not present

## 2023-11-22 DIAGNOSIS — I4729 Other ventricular tachycardia: Secondary | ICD-10-CM | POA: Diagnosis not present

## 2023-11-22 DIAGNOSIS — I255 Ischemic cardiomyopathy: Secondary | ICD-10-CM

## 2023-11-22 DIAGNOSIS — I10 Essential (primary) hypertension: Secondary | ICD-10-CM | POA: Diagnosis not present

## 2023-11-22 MED ORDER — FUROSEMIDE 40 MG PO TABS: 40.0000 mg | ORAL_TABLET | Freq: Two times a day (BID) | ORAL | 3 refills | Status: AC

## 2023-11-22 MED ORDER — ISOSORBIDE MONONITRATE ER 30 MG PO TB24: 30.0000 mg | ORAL_TABLET | Freq: Every day | ORAL | 3 refills | Status: AC

## 2023-11-22 NOTE — Progress Notes (Unsigned)
 Cardiology Office Note:    Date:  11/22/2023   ID:  Shane Massey, DOB [birth year], MRN [id number]  PCP:  Barbar Levine, MD  Cardiologist:  Ralene Burger, MD    Referring MD: Barbar Levine, MD   Chief Complaint  Patient presents with   Follow-up    History of Present Illness:    Shane Massey is a 83 y.o. male    with past medical history significant for coronary artery disease.  In March 2017 he required PTCA and stenting of the mid LAD he also have history of ischemic cardiomyopathy however ejection fraction determined in January 2020 was normal.  He been complaining of fatigue tiredness for long period time eventually we decided to pursue cardiac catheterization.  That showed 60 to 75% stenosis of left main, 80% stenosis of ramus intermedius, 50% stenosis first diagonal branch on 08/13/2019 he ended up having coronary bypass graft with LIMA to LAD, left radial to second obtuse marginal branch and RIMA to ramus intermedius at the same time he did have PFO closure.  Recovery was complicated by an episode of atrial fibrillation successfully managed with  He has been struggling for a while with shortness of breath he had difficulty finding out what the problem was extensive evaluation has been performed included echocardiogram as well as stress test which was negative eventually D-dimer has been performed which was high CT of the chest showed pulmonary emboli.  End up being admitted to the hospital put on anticoagulation and discharged home however after that he end up 1 more time in the emergency room with shortness of breath no new pulmonary emboli.  Recently he started having some strength symptoms monitor has been placed he was find to have nonsustained ventricular tachycardia but frequent episodes, he was seen by my colleague Dr. Lawana Pray in EP consultation.  He was put on mexiletine.  Last time I am seeing him he was still complaining 5 some symptoms eventually cardiac  catheterization was done which show only 1 branch of circumflex artery very small with 80% stenosis everything else seems to be nicely open except left main which Chronic significant stenosis which we knew about and that is why he got bypass surgery.  He was found to be elevated LVEDP 30 mmHg Comes today 2 months for follow-up he is not doing well complain of being weak fatigued tired exhausted.  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   Ischemic cardiomyopathy 10/24/2016   Overview:  Ejection fraction 45% in spring 2018   Myocardial infarction [facility])    Near syncope    Nonsustained ventricular tachycardia (HCC) 07/17/2018   OSA (obstructive sleep apnea)    OSA on CPAP 11/14/2015   CPAP 13    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   Paroxysmal atrial fibrillation (HCC) 09/09/2019   Pulmonary embolism (HCC)    Restless leg syndrome 11/30/2016  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    Past Surgical History:  Procedure Laterality Date   CATARACT EXTRACTION  2012   COLONOSCOPY  2013   CORONARY ARTERY BYPASS GRAFT N/A 08/13/2019   Procedure: CORONARY ARTERY BYPASS GRAFTING (CABG) TIMES 3.;  Surgeon: Rudine Cos, MD;  Location: [facility] OR;  Service: Open Heart Surgery;  Laterality: N/A;   CORONARY STENT PLACEMENT     heart stent Left 09/2015   HERNIA REPAIR  11/1977   LEFT HEART CATH AND CORONARY ANGIOGRAPHY N/A 08/06/2019    Procedure: LEFT HEART CATH AND CORONARY ANGIOGRAPHY;  Surgeon: Millicent Ally, MD;  Location: [facility] INVASIVE CV LAB;  Service: Cardiovascular;  Laterality: N/A;   LEFT HEART CATH AND CORS/GRAFTS ANGIOGRAPHY N/A 04/30/2023   Procedure: LEFT HEART CATH AND CORS/GRAFTS ANGIOGRAPHY;  Surgeon: Kyra Phy, MD;  Location: [facility] INVASIVE CV LAB;  Service: Cardiovascular;  Laterality: N/A;   RADIAL ARTERY HARVEST Left 08/13/2019   Procedure: OPEN RADIAL ARTERY HARVEST;  Surgeon: Rudine Cos, MD;  Location: [facility] OR;  Service: Open Heart Surgery;  Laterality: Left;   REPAIR OF PATENT FORAMEN OVALE N/A 08/13/2019   Procedure: CLOSURE OF PATENT FORAMEN OVALE;  Surgeon: Rudine Cos, MD;  Location: [facility] OR;  Service: Open Heart Surgery;  Laterality: N/A;   RTC repair  Right 11/03/2020   SURGERY FOR BOWEL ABSCESS  10/1959   TEE WITHOUT CARDIOVERSION N/A 08/13/2019   Procedure: TRANSESOPHAGEAL ECHOCARDIOGRAM (TEE);  Surgeon: Rudine Cos, MD;  Location: [facility] OR;  Service: Open Heart Surgery;  Laterality: N/A;    Current Medications: Current Meds  Medication Sig   apixaban  (ELIQUIS ) 5 MG TABS tablet Take 1 tablet (5 mg total) by mouth 2 (two) times daily.   Calcium  Carb-Cholecalciferol (CALCIUM  600+D3 PO) Take 1 tablet by mouth daily at 12 noon.   clopidogrel  (PLAVIX ) 75 MG tablet Take 1 tablet by mouth once daily   dexlansoprazole (DEXILANT) 60 MG capsule Take 60 mg by mouth daily before breakfast.   isosorbide  mononitrate (IMDUR ) 30 MG 24 hr tablet Take 1 tablet (30 mg total) by mouth daily.   levothyroxine  (SYNTHROID ) 75 MCG tablet Take 75 mcg by mouth daily before breakfast.   mexiletine (MEXITIL) 250 MG capsule Take 1 capsule (250 mg total) by mouth 2 (two) times daily.   Multiple Vitamin (MULTIVITAMIN WITH MINERALS) TABS tablet Take 1 tablet by mouth daily at 12 noon. Unknown strength per patient   nitroGLYCERIN  (NITROSTAT ) 0.4 MG SL tablet Place 1 tablet (0.4 mg total) under the tongue  every 5 (five) minutes as needed for chest pain.   potassium chloride  (KLOR-CON ) 10 MEQ tablet Take 1 tablet by mouth once daily   pramipexole  (MIRAPEX ) 1.5 MG tablet Take 1.5 mg by mouth at bedtime.    rosuvastatin  (CRESTOR ) 20 MG tablet Take 1 tablet (20 mg total) by mouth daily.   sacubitril -valsartan  (ENTRESTO ) 24-26 MG Take 1 tablet by mouth 2 (two) times daily.   TRELEGY ELLIPTA  100-62.5-25 MCG/ACT AEPB Inhale 1 puff into the lungs daily.   [DISCONTINUED] furosemide  (LASIX ) 40 MG tablet Take 1.5 tablets (60 mg total) by mouth 2 (two) times daily. Please keep scheduled appointment for future refills. Thank you.   [DISCONTINUED] isosorbide  mononitrate (IMDUR ) 60 MG 24 hr tablet Take 1 tablet (60 mg total) by mouth daily.     Allergies:   Patient has no known allergies.   Social History   Socioeconomic History   Marital status: Married    Spouse name:  Not on file   Number of children: Not on file   Years of education: Not on file   Highest education level: Not on file  Occupational History   Not on file  Tobacco Use   Smoking status: Former    Current packs/day: 0.00    Average packs/day: 0.5 packs/day for 4.0 years (2.0 ttl pk-yrs)    Types: Cigarettes    Start date: 06/11/1954    Quit date: 06/11/1958    Years since quitting: 65.4   Smokeless tobacco: Former    Types: Chew    Quit date: 12/01/1958  Vaping Use   Vaping status: Never Used  Substance and Sexual Activity   Alcohol  use: No    Alcohol /week: 0.0 standard drinks of alcohol    Drug use: No   Sexual activity: Not on file  Other Topics Concern   Not on file  Social History Narrative   Are you right handed or left handed? Right    Are you currently employed ? Retired    What is your current occupation?   Do you live at home alone?no    Who lives with you? spouse   What type of home do you live in: 1 story or 2 story? 1 story        Social Drivers of Corporate investment banker Strain: Not on file  Food  Insecurity: Not on file  Transportation Needs: Not on file  Physical Activity: Not on file  Stress: Not on file  Social Connections: Not on file     Family History: The patient's family history includes Asthma in his brother and maternal grandmother; Heart attack in his brother; Heart disease in his brother; Stroke in his father. ROS:   Please see the history of present illness.    All 14 point review of systems negative except as described per history of present illness  EKGs/Labs/Other Studies Reviewed:         Recent Labs: 04/22/2023: Hemoglobin 12.4; Platelets 235 06/24/2023: NT-Pro BNP 301 08/05/2023: BUN 22; Creatinine, Ser 1.11; Potassium 4.6; Sodium 144  Recent Lipid Panel    Component Value Date/Time   CHOL 128 04/12/2020 0913   TRIG 111 04/12/2020 0913   HDL 48 04/12/2020 0913   CHOLHDL 2.7 04/12/2020 0913   CHOLHDL 2.5 08/13/2019 0603   VLDL 25 08/13/2019 0603   LDLCALC 60 04/12/2020 0913    Physical Exam:    VS:  BP (!) 102/40 (BP Location: Right Arm, Patient Position: Sitting)   Pulse (!) 56   Ht 5' 10 (1.778 m)   Wt 177 lb 12.8 oz (80.6 kg)   SpO2 93%   BMI 25.51 kg/m     Wt Readings from Last 3 Encounters:  11/22/23 177 lb 12.8 oz (80.6 kg)  09/16/23 181 lb 3.2 oz (82.2 kg)  08/14/23 181 lb (82.1 kg)     GEN:  Well nourished, well developed in no acute distress HEENT: Normal NECK: No JVD; No carotid bruits LYMPHATICS: No lymphadenopathy CARDIAC: RRR, no murmurs, no rubs, no gallops RESPIRATORY:  Clear to auscultation without rales, wheezing or rhonchi  ABDOMEN: Soft, non-tender, non-distended MUSCULOSKELETAL:  No edema; No deformity  SKIN: Warm and dry LOWER EXTREMITIES: no swelling NEUROLOGIC:  Alert and oriented x 3 PSYCHIATRIC:  Normal affect   ASSESSMENT:    1. Coronary artery disease involving coronary bypass graft of native heart with angina pectoris (HCC)   2. Ischemic cardiomyopathy   3. Essential hypertension   4. Nonsustained  ventricular tachycardia (HCC)   5. Paroxysmal atrial fibrillation (HCC)   6. S/P CABG x 3    PLAN:    In order of problems listed above:  Coronary artery disease does not have any chest pain tightness squeezing pressure burning chest on guideline directed medical therapy which I continue. Ischemic cardiomyopathy.  Last ejection fraction 4045%.  Compensated on physical exam. Essential hypertension his blood pressure especially is very low I will reduce dose of Lasix  to only 40 twice daily I will reduce dose of Imdur  to only 30 a day I see him back next week to see how he does. Paroxysmal atrial fibrillation maintained sinus rhythm. Frequent ventricular ectopy with runs of ventricular tachycardia he is on mexiletine there is conversation about starting him on sotalol, but his wife read about this medication she is almost upset to the point why we were talking about potentially putting him on medication with all side effects I tried to calm her down he may be candidate for V. tach/PVCs ablation.   Medication Adjustments/Labs and Tests Ordered: Current medicines are reviewed at length with the patient today.  Concerns regarding medicines are outlined above.  No orders of the defined types were placed in this encounter.  Medication changes:  Meds ordered this encounter  Medications   furosemide  (LASIX ) 40 MG tablet    Sig: Take 1 tablet (40 mg total) by mouth 2 (two) times daily.    Dispense:  180 tablet    Refill:  3   isosorbide  mononitrate (IMDUR ) 30 MG 24 hr tablet    Sig: Take 1 tablet (30 mg total) by mouth daily.    Dispense:  90 tablet    Refill:  3    Signed, Manfred Seed, MD, [facility] 11/22/2023 4:46 PM    [facility]

## 2023-11-22 NOTE — Patient Instructions (Addendum)
 Medication Instructions:   DECREASE: Imdur  to 30mg  daily  DECREASE: Lasix  40mg  twice daily   Lab Work: None Ordered If you have labs (blood work) drawn today and your tests are completely normal, you will receive your results only by: MyChart Message (if you have MyChart) OR A paper copy in the mail If you have any lab test that is abnormal or we need to change your treatment, we will call you to review the results.   Testing/Procedures: None Ordered   Follow-Up: At [facility], you and your health needs are our priority.  As part of our continuing mission to provide you with exceptional heart care, we have created designated Provider Care Teams.  These Care Teams include your primary Cardiologist (physician) and Advanced Practice Providers (APPs -  Physician Assistants and Nurse Practitioners) who all work together to provide you with the care you need, when you need it.  We recommend signing up for the patient portal called MyChart.  Sign up information is provided on this After Visit Summary.  MyChart is used to connect with patients for Virtual Visits (Telemedicine).  Patients are able to view lab/test results, encounter notes, upcoming appointments, etc.  Non-urgent messages can be sent to your provider as well.   To learn more about what you can do with MyChart, go to [email or web address].    Your next appointment:   1 week(s)  The format for your next appointment:   In Person  Provider:   Ralene Burger, MD    Other Instructions NA

## 2023-11-29 ENCOUNTER — Ambulatory Visit: Attending: Cardiology | Admitting: Cardiology

## 2023-11-29 VITALS — BP 134/64 | HR 60 | Ht 70.0 in | Wt 181.0 lb

## 2023-11-29 DIAGNOSIS — Z951 Presence of aortocoronary bypass graft: Secondary | ICD-10-CM | POA: Diagnosis not present

## 2023-11-29 DIAGNOSIS — I25708 Atherosclerosis of coronary artery bypass graft(s), unspecified, with other forms of angina pectoris: Secondary | ICD-10-CM

## 2023-11-29 DIAGNOSIS — I10 Essential (primary) hypertension: Secondary | ICD-10-CM

## 2023-11-29 DIAGNOSIS — I480 Paroxysmal atrial fibrillation: Secondary | ICD-10-CM | POA: Diagnosis not present

## 2023-11-29 DIAGNOSIS — I509 Heart failure, unspecified: Secondary | ICD-10-CM | POA: Insufficient documentation

## 2023-11-29 DIAGNOSIS — I255 Ischemic cardiomyopathy: Secondary | ICD-10-CM

## 2023-11-29 NOTE — Progress Notes (Signed)
 Cardiology Office Note:    Date:  11/29/2023   ID:  Shane Massey, DOB May 14, 1941, MRN [id number]  PCP:  Barbar Levine, MD  Cardiologist:  Ralene Burger, MD    Referring MD: Barbar Levine, MD   No chief complaint on file.   History of Present Illness:    Shane Massey is a 83 y.o. male    with past medical history significant for coronary artery disease.  In March 2017 he required PTCA and stenting of the mid LAD he also have history of ischemic cardiomyopathy however ejection fraction determined in January 2020 was normal.  He been complaining of fatigue tiredness for long period time eventually we decided to pursue cardiac catheterization.  That showed 60 to 75% stenosis of left main, 80% stenosis of ramus intermedius, 50% stenosis first diagonal branch on 08/13/2019 he ended up having coronary bypass graft with LIMA to LAD, left radial to second obtuse marginal branch and RIMA to ramus intermedius at the same time he did have PFO closure.  Recovery was complicated by an episode of atrial fibrillation successfully managed with  He has been struggling for a while with shortness of breath he had difficulty finding out what the problem was extensive evaluation has been performed included echocardiogram as well as stress test which was negative eventually D-dimer has been performed which was high CT of the chest showed pulmonary emboli.  End up being admitted to the hospital put on anticoagulation and discharged home however after that he end up 1 more time in the emergency room with shortness of breath no new pulmonary emboli.  Recently he started having some strength symptoms monitor has been placed he was find to have nonsustained ventricular tachycardia but frequent episodes, he was seen by my colleague Dr. Lawana Pray in EP consultation.  He was put on mexiletine. Last time I am seeing him he was still complaining 5 some symptoms eventually cardiac catheterization was done which show only  1 branch of circumflex artery very small with 80% stenosis everything else seems to be nicely open except left main which Chronic significant stenosis which we knew about and that is why he got bypass surgery. He was found to be elevated LVEDP 30 mmHg  I see him last week he is was not doing well he was complaining of being tired exhausted it was difficult to gradually what the problem was his blood pressure was a little low so decided to cut down his Lasix  as well as Imdur .  He comes today for follow-up he can tell me if there is much difference.  Another suspicion that the heart was divided he had frequent ventricular ectopy.  He was taking mexiletine there was some issue about potentially starting sotalol but he is reluctant to take it because he is worried about his lung condition.  Since have seen him last time his Imdur  has been reduced from 60-30 he describes 1 episode of suspicion chest pain.  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   Atherosclerosis of coronary artery bypass graft(s), unspecified, with other forms of angina pectoris (HCC) 07/07/2021   CAD (coronary artery disease) 08/12/2019   CHF (congestive heart failure) (HCC)    Coronary artery disease    Coronary artery disease involving coronary bypass graft of native heart with angina pectoris (HCC) 07/07/2021   Dependence on other enabling machines and devices 07/07/2021   Dizziness 06/14/2017   Dyslipidemia 09/06/2015   Dyspnea    Dyspnea  on exertion 09/06/2015   Dysrhythmia    Essential hypertension    Exertional dyspnea 09/06/2015   GERD (gastroesophageal reflux disease)    History of pulmonary embolism 07/31/2022   Hypertension    Hypothyroidism    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   ILD (interstitial lung disease) (HCC) 04/07/2020   Ischemic cardiomyopathy 10/24/2016   Overview:  Ejection  fraction 45% in spring 2018   Myocardial infarction [facility])    Near syncope    Nocturnal hypoxia 11/21/2023   Nonsustained ventricular tachycardia (HCC) 07/17/2018   OSA (obstructive sleep apnea)    OSA on CPAP 11/14/2015   CPAP 13    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   Paroxysmal atrial fibrillation (HCC) 09/09/2019   Pulmonary embolism (HCC)    Restless leg syndrome 11/30/2016   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    Past Surgical History:  Procedure Laterality Date   CATARACT EXTRACTION  2012   COLONOSCOPY  2013   CORONARY ARTERY BYPASS GRAFT N/A 08/13/2019   Procedure: CORONARY ARTERY BYPASS GRAFTING (CABG) TIMES 3.;  Surgeon: Rudine Cos, MD;  Location: [facility] OR;  Service: Open Heart Surgery;  Laterality: N/A;   CORONARY STENT PLACEMENT     heart stent Left 09/2015   HERNIA REPAIR  11/1977   LEFT HEART CATH AND CORONARY ANGIOGRAPHY N/A 08/06/2019   Procedure: LEFT HEART CATH AND CORONARY ANGIOGRAPHY;  Surgeon: Millicent Ally, MD;  Location: [facility] INVASIVE CV LAB;  Service: Cardiovascular;  Laterality: N/A;   LEFT HEART CATH AND CORS/GRAFTS ANGIOGRAPHY N/A 04/30/2023   Procedure: LEFT HEART CATH AND CORS/GRAFTS ANGIOGRAPHY;  Surgeon: Kyra Phy, MD;  Location: [facility] INVASIVE CV LAB;  Service: Cardiovascular;  Laterality: N/A;   RADIAL ARTERY HARVEST Left 08/13/2019   Procedure: OPEN RADIAL ARTERY HARVEST;  Surgeon: Rudine Cos, MD;  Location: [facility] OR;  Service: Open Heart Surgery;  Laterality: Left;   REPAIR OF PATENT FORAMEN OVALE N/A 08/13/2019   Procedure: CLOSURE OF PATENT FORAMEN OVALE;  Surgeon: Rudine Cos, MD;  Location: [facility] OR;  Service: Open Heart Surgery;  Laterality: N/A;   RTC repair  Right 11/03/2020   SURGERY FOR BOWEL ABSCESS  10/1959   TEE WITHOUT CARDIOVERSION N/A 08/13/2019   Procedure: TRANSESOPHAGEAL ECHOCARDIOGRAM (TEE);  Surgeon: Rudine Cos, MD;  Location: [facility] OR;  Service: Open Heart Surgery;  Laterality: N/A;    Current  Medications: Current Meds  Medication Sig   apixaban  (ELIQUIS ) 5 MG TABS tablet Take 1 tablet (5 mg total) by mouth 2 (two) times daily.   Calcium  Carb-Cholecalciferol (CALCIUM  600+D3 PO) Take 1 tablet by mouth daily at 12 noon.   clopidogrel  (PLAVIX ) 75 MG tablet Take 1 tablet by mouth once daily   dexlansoprazole (DEXILANT) 60 MG capsule Take 60 mg by mouth daily before breakfast.   furosemide  (LASIX ) 40 MG tablet Take 1 tablet (40 mg total) by mouth 2 (two) times daily.   isosorbide  mononitrate (IMDUR ) 30 MG 24 hr tablet Take 1 tablet (30 mg total) by mouth daily.   levothyroxine  (SYNTHROID ) 75 MCG tablet Take 75 mcg by mouth daily before breakfast.   mexiletine (MEXITIL) 250 MG capsule Take 1 capsule (250 mg total) by mouth 2 (two) times daily.   Multiple Vitamin (MULTIVITAMIN WITH MINERALS) TABS tablet Take 1 tablet by mouth daily at 12 noon. Unknown strength per patient   nitroGLYCERIN  (NITROSTAT ) 0.4 MG SL tablet Place 1 tablet (0.4 mg total) under  the tongue every 5 (five) minutes as needed for chest pain.   potassium chloride  (KLOR-CON ) 10 MEQ tablet Take 1 tablet by mouth once daily   pramipexole  (MIRAPEX ) 1.5 MG tablet Take 1.5 mg by mouth at bedtime.    rosuvastatin  (CRESTOR ) 20 MG tablet Take 1 tablet (20 mg total) by mouth daily.   sacubitril -valsartan  (ENTRESTO ) 24-26 MG Take 1 tablet by mouth 2 (two) times daily.   TRELEGY ELLIPTA  100-62.5-25 MCG/ACT AEPB Inhale 1 puff into the lungs daily.     Allergies:   Patient has no known allergies.   Social History   Socioeconomic History   Marital status: Married    Spouse name: Not on file   Number of children: Not on file   Years of education: Not on file   Highest education level: Not on file  Occupational History   Not on file  Tobacco Use   Smoking status: Former    Current packs/day: 0.00    Average packs/day: 0.5 packs/day for 4.0 years (2.0 ttl pk-yrs)    Types: Cigarettes    Start date: 06/11/1954    Quit date:  06/11/1958    Years since quitting: 65.5   Smokeless tobacco: Former    Types: Chew    Quit date: 12/01/1958  Vaping Use   Vaping status: Never Used  Substance and Sexual Activity   Alcohol  use: No    Alcohol /week: 0.0 standard drinks of alcohol    Drug use: No   Sexual activity: Not on file  Other Topics Concern   Not on file  Social History Narrative   Are you right handed or left handed? Right    Are you currently employed ? Retired    What is your current occupation?   Do you live at home alone?no    Who lives with you? spouse   What type of home do you live in: 1 story or 2 story? 1 story        Social Drivers of Corporate investment banker Strain: Not on file  Food Insecurity: Not on file  Transportation Needs: Not on file  Physical Activity: Not on file  Stress: Not on file  Social Connections: Not on file     Family History: The patient's family history includes Asthma in his brother and maternal grandmother; Heart attack in his brother; Heart disease in his brother; Stroke in his father. ROS:   Please see the history of present illness.    All 14 point review of systems negative except as described per history of present illness  EKGs/Labs/Other Studies Reviewed:         Recent Labs: 04/22/2023: Hemoglobin 12.4; Platelets 235 06/24/2023: NT-Pro BNP 301 08/05/2023: BUN 22; Creatinine, Ser 1.11; Potassium 4.6; Sodium 144  Recent Lipid Panel    Component Value Date/Time   CHOL 128 04/12/2020 0913   TRIG 111 04/12/2020 0913   HDL 48 04/12/2020 0913   CHOLHDL 2.7 04/12/2020 0913   CHOLHDL 2.5 08/13/2019 0603   VLDL 25 08/13/2019 0603   LDLCALC 60 04/12/2020 0913    Physical Exam:    VS:  BP 134/64   Pulse 60   Ht 5' 10 (1.778 m)   Wt 181 lb (82.1 kg)   SpO2 96%   BMI 25.97 kg/m     Wt Readings from Last 3 Encounters:  11/29/23 181 lb (82.1 kg)  11/22/23 177 lb 12.8 oz (80.6 kg)  09/16/23 181 lb 3.2 oz (82.2 kg)  GEN:  Well nourished, well  developed in no acute distress HEENT: Normal NECK: No JVD; No carotid bruits LYMPHATICS: No lymphadenopathy CARDIAC: RRR, no murmurs, no rubs, no gallops RESPIRATORY:  Clear to auscultation without rales, wheezing or rhonchi  ABDOMEN: Soft, non-tender, non-distended MUSCULOSKELETAL:  No edema; No deformity  SKIN: Warm and dry LOWER EXTREMITIES: no swelling NEUROLOGIC:  Alert and oriented x 3 PSYCHIATRIC:  Normal affect   ASSESSMENT:    1. Atherosclerosis of coronary artery bypass graft(s), unspecified, with other forms of angina pectoris (HCC)   2. Essential hypertension   3. Ischemic cardiomyopathy   4. Paroxysmal atrial fibrillation (HCC)   5. S/P CABG x 3    PLAN:    In order of problems listed above:  Coronary disease 1 episode of chest pain will go back to 60 mg of Imdur . Essential hypertension blood pressure is well-controlled right now we will add higher dose of Imdur  to see if we can prevent him from having pain. History of ischemic cardiomyopathy on appropriate medications which I will continue. Paroxysmal atrial fibrillation.  Anticoagulated. Frequent ventricular ectopy.  He will have appointment with EP team for consideration of ablation.   Medication Adjustments/Labs and Tests Ordered: Current medicines are reviewed at length with the patient today.  Concerns regarding medicines are outlined above.  No orders of the defined types were placed in this encounter.  Medication changes: No orders of the defined types were placed in this encounter.   Signed, Manfred Seed, MD, [facility] 11/29/2023 11:01 AM    [facility]

## 2023-11-29 NOTE — Patient Instructions (Addendum)
 Medication Instructions:   Isosorbide  - Imdur  60mg  daily   Lab Work: None Ordered If you have labs (blood work) drawn today and your tests are completely normal, you will receive your results only by: MyChart Message (if you have MyChart) OR A paper copy in the mail If you have any lab test that is abnormal or we need to change your treatment, we will call you to review the results.   Testing/Procedures: None Ordered   Follow-Up: At [facility], you and your health needs are our priority.  As part of our continuing mission to provide you with exceptional heart care, we have created designated Provider Care Teams.  These Care Teams include your primary Cardiologist (physician) and Advanced Practice Providers (APPs -  Physician Assistants and Nurse Practitioners) who all work together to provide you with the care you need, when you need it.  We recommend signing up for the patient portal called MyChart.  Sign up information is provided on this After Visit Summary.  MyChart is used to connect with patients for Virtual Visits (Telemedicine).  Patients are able to view lab/test results, encounter notes, upcoming appointments, etc.  Non-urgent messages can be sent to your provider as well.   To learn more about what you can do with MyChart, go to [email or web address].    Your next appointment:   3 month(s)  The format for your next appointment:   In Person  Provider:   Ralene Burger, MD    Other Instructions NA

## 2023-12-02 ENCOUNTER — Telehealth: Payer: Self-pay | Admitting: *Deleted

## 2023-12-02 NOTE — Telephone Encounter (Signed)
 Left message to call back    Me   11/08/23  4:48 PM Note Explained to wife monitor findings and MD recommendation.  Aware I will send information on  the medication via mychart and further explained why he would need to be admitted for medication loading.  They will take a look at the information over the weekend and also would like to further discuss with Dr. Krasowski at their appt in 2 weeks.  Will notify Camnitz of plan.  Wife appreciates my return call.        Will [name], MD 10/24/2023  2:27 PM EDT     PVCs remain elevated. If willing could try admit for sotalol loading so can avoid amio with interstitial lung disease

## 2023-12-03 NOTE — Telephone Encounter (Signed)
 Patient's daughter returning call.

## 2023-12-03 NOTE — Telephone Encounter (Signed)
 Spoke to dtr, she explains that pt is out of town and she will give him the message when he returns.  She will let him know to call the office to discuss plan.  Thanked dtr for relaying message.

## 2023-12-10 DIAGNOSIS — I10 Essential (primary) hypertension: Secondary | ICD-10-CM | POA: Diagnosis not present

## 2023-12-10 DIAGNOSIS — E785 Hyperlipidemia, unspecified: Secondary | ICD-10-CM | POA: Diagnosis not present

## 2023-12-11 DIAGNOSIS — L570 Actinic keratosis: Secondary | ICD-10-CM | POA: Diagnosis not present

## 2023-12-23 DIAGNOSIS — E785 Hyperlipidemia, unspecified: Secondary | ICD-10-CM | POA: Diagnosis not present

## 2023-12-23 DIAGNOSIS — R7303 Prediabetes: Secondary | ICD-10-CM | POA: Diagnosis not present

## 2023-12-23 DIAGNOSIS — E039 Hypothyroidism, unspecified: Secondary | ICD-10-CM | POA: Diagnosis not present

## 2023-12-26 ENCOUNTER — Ambulatory Visit: Attending: Cardiology

## 2023-12-26 VITALS — BP 114/62 | HR 80 | Ht 70.0 in | Wt 179.2 lb

## 2023-12-26 DIAGNOSIS — I959 Hypotension, unspecified: Secondary | ICD-10-CM

## 2023-12-26 NOTE — Progress Notes (Signed)
   Nurse Visit   Date of Encounter: 12/26/2023 ID: FILMORE MOLYNEUX, DOB [birth year], MRN [id number]  PCP:  Trinidad Glisson, MD   [facility] Providers Cardiologist:  Lamar Fitch, MD Electrophysiologist:  Soyla [name], MD      Visit Details   VS:  BP 114/62 (BP Location: Right Arm, Patient Position: Standing, Cuff Size: Normal) Comment (Patient Position): 1 minute  Pulse 80   Ht 5' 10 (1.778 m)   Wt 179 lb 3.2 oz (81.3 kg)   SpO2 96%   BMI 25.71 kg/m  , BMI Body mass index is 25.71 kg/m.  Wt Readings from Last 3 Encounters:  12/26/23 179 lb 3.2 oz (81.3 kg)  11/29/23 181 lb (82.1 kg)  11/22/23 177 lb 12.8 oz (80.6 kg)     Reason for visit: fatigue and low BP Performed today: Vitals, Provider consulted:Revankar, and Education Changes (medications, testing, etc.) : Stop Imdur , restart if begins having chest pain Length of Visit: 15 minutes  Pt reports that he feels weak, pants and BP has been low at home. Denies chest pain. Dr. Edwyna recommends stopping Imdur  but to restart for chest pain. Pt verbalized understanding and had no additional questions.  Medications Adjustments/Labs and Tests Ordered: No orders of the defined types were placed in this encounter.  No orders of the defined types were placed in this encounter.    Signed, Melene Meissner, RN  12/26/2023 12:04 PM

## 2023-12-27 DIAGNOSIS — W57XXXA Bitten or stung by nonvenomous insect and other nonvenomous arthropods, initial encounter: Secondary | ICD-10-CM | POA: Diagnosis not present

## 2023-12-27 DIAGNOSIS — S90561A Insect bite (nonvenomous), right ankle, initial encounter: Secondary | ICD-10-CM | POA: Diagnosis not present

## 2023-12-27 DIAGNOSIS — M25869 Other specified joint disorders, unspecified knee: Secondary | ICD-10-CM | POA: Diagnosis not present

## 2023-12-27 DIAGNOSIS — M25561 Pain in right knee: Secondary | ICD-10-CM | POA: Diagnosis not present

## 2023-12-28 DIAGNOSIS — W57XXXA Bitten or stung by nonvenomous insect and other nonvenomous arthropods, initial encounter: Secondary | ICD-10-CM | POA: Diagnosis not present

## 2023-12-28 DIAGNOSIS — S90561A Insect bite (nonvenomous), right ankle, initial encounter: Secondary | ICD-10-CM | POA: Diagnosis not present

## 2023-12-28 DIAGNOSIS — M25561 Pain in right knee: Secondary | ICD-10-CM | POA: Diagnosis not present

## 2023-12-30 ENCOUNTER — Ambulatory Visit: Attending: Cardiology | Admitting: Cardiology

## 2023-12-30 VITALS — BP 108/48 | HR 65 | Ht 70.0 in | Wt 178.0 lb

## 2023-12-30 DIAGNOSIS — I10 Essential (primary) hypertension: Secondary | ICD-10-CM | POA: Diagnosis not present

## 2023-12-30 DIAGNOSIS — I480 Paroxysmal atrial fibrillation: Secondary | ICD-10-CM

## 2023-12-30 DIAGNOSIS — Z951 Presence of aortocoronary bypass graft: Secondary | ICD-10-CM | POA: Diagnosis not present

## 2023-12-30 DIAGNOSIS — E785 Hyperlipidemia, unspecified: Secondary | ICD-10-CM | POA: Diagnosis not present

## 2023-12-30 DIAGNOSIS — I25709 Atherosclerosis of coronary artery bypass graft(s), unspecified, with unspecified angina pectoris: Secondary | ICD-10-CM

## 2023-12-30 DIAGNOSIS — Z6827 Body mass index (BMI) 27.0-27.9, adult: Secondary | ICD-10-CM | POA: Diagnosis not present

## 2023-12-30 DIAGNOSIS — E039 Hypothyroidism, unspecified: Secondary | ICD-10-CM | POA: Diagnosis not present

## 2023-12-30 DIAGNOSIS — R7303 Prediabetes: Secondary | ICD-10-CM | POA: Diagnosis not present

## 2023-12-30 DIAGNOSIS — I25708 Atherosclerosis of coronary artery bypass graft(s), unspecified, with other forms of angina pectoris: Secondary | ICD-10-CM

## 2023-12-30 DIAGNOSIS — I2510 Atherosclerotic heart disease of native coronary artery without angina pectoris: Secondary | ICD-10-CM | POA: Diagnosis not present

## 2023-12-30 MED ORDER — LOSARTAN POTASSIUM 25 MG PO TABS: 25.0000 mg | ORAL_TABLET | Freq: Every day | ORAL | 0 refills | Status: DC

## 2023-12-30 NOTE — Addendum Note (Signed)
 Addended by: ARLOA PLANAS D on: 12/30/2023 04:09 PM   Modules accepted: Orders

## 2023-12-30 NOTE — Patient Instructions (Signed)
 Medication Instructions:   STOP: Entresto   START: Losartan  25mg  1 tablet daily   Lab Work: None Ordered If you have labs (blood work) drawn today and your tests are completely normal, you will receive your results only by: MyChart Message (if you have MyChart) OR A paper copy in the mail If you have any lab test that is abnormal or we need to change your treatment, we will call you to review the results.   Testing/Procedures: None Ordered   Follow-Up: At [facility], you and your health needs are our priority.  As part of our continuing mission to provide you with exceptional heart care, we have created designated Provider Care Teams.  These Care Teams include your primary Cardiologist (physician) and Advanced Practice Providers (APPs -  Physician Assistants and Nurse Practitioners) who all work together to provide you with the care you need, when you need it.  We recommend signing up for the patient portal called MyChart.  Sign up information is provided on this After Visit Summary.  MyChart is used to connect with patients for Virtual Visits (Telemedicine).  Patients are able to view lab/test results, encounter notes, upcoming appointments, etc.  Non-urgent messages can be sent to your provider as well.   To learn more about what you can do with MyChart, go to [email or web address].    Your next appointment:   2 month(s)  The format for your next appointment:   In Person  Provider:   Lamar Fitch, MD    Other Instructions NA

## 2023-12-30 NOTE — Progress Notes (Signed)
 Cardiology Office Note:    Date:  12/30/2023   ID:  Shane Massey, DOB [birth year], MRN [id number]  PCP:  Trinidad Glisson, MD  Cardiologist:  Lamar Fitch, MD    Referring MD: Trinidad Glisson, MD   Chief Complaint  Patient presents with   Blood Pressure Check    History of Present Illness:    Shane Massey is a 83 y.o. male    with past medical history significant for coronary artery disease.  In March 2017 he required PTCA and stenting of the mid LAD he also have history of ischemic cardiomyopathy however ejection fraction determined in January 2020 was normal.  He been complaining of fatigue tiredness for long period time eventually we decided to pursue cardiac catheterization.  That showed 60 to 75% stenosis of left main, 80% stenosis of ramus intermedius, 50% stenosis first diagonal branch on 08/13/2019 he ended up having coronary bypass graft with LIMA to LAD, left radial to second obtuse marginal branch and RIMA to ramus intermedius at the same time he did have PFO closure.  Recovery was complicated by an episode of atrial fibrillation successfully managed with  He has been struggling for a while with shortness of breath he had difficulty finding out what the problem was extensive evaluation has been performed included echocardiogram as well as stress test which was negative eventually D-dimer has been performed which was high CT of the chest showed pulmonary emboli.  End up being admitted to the hospital put on anticoagulation and discharged home however after that he end up 1 more time in the emergency room with shortness of breath no new pulmonary emboli.  Recently he started having some strength symptoms monitor has been placed he was find to have nonsustained ventricular tachycardia but frequent episodes, he was seen by my colleague Dr. Inocencio in EP consultation.  He was put on mexiletine. Last time I am seeing him he was still complaining 5 some symptoms eventually cardiac  catheterization was done which show only 1 branch of circumflex artery very small with 80% stenosis everything else seems to be nicely open except left main which Chronic significant stenosis which we knew about and that is why he got bypass surgery. He was found to be elevated LVEDP 30 mmHg  I see him last week he is was not doing well he was complaining of being tired exhausted it was difficult to gradually what the problem was his blood pressure was a little low so decided to cut down his Lasix  as well as Imdur .  He comes today for follow-up he can tell me if there is much difference.  Another suspicion that the heart was divided he had frequent ventricular ectopy.  Comes today to months for follow-up since have seen him last time few issues happen.  He started feeling poor weak tired exhausted blood pressure being low, end up having fever he went to the emergency room he was found to have [facility] spotted fever doxycycline has been given and he seems to be doing okay but still weak tired and exhausted with low blood pressure.  We discontinued completely Imdur  which resulted with some chest pain so his wife started only 30 mg daily which is very appropriate  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   Atherosclerosis of coronary artery bypass graft(s), unspecified, with other forms of angina pectoris (HCC) 07/07/2021   CAD (coronary artery disease) 08/12/2019   CHF (congestive heart failure) (HCC)  Coronary artery disease    Coronary artery disease involving coronary bypass graft of native heart with angina pectoris (HCC) 07/07/2021   Dependence on other enabling machines and devices 07/07/2021   Dizziness 06/14/2017   Dyslipidemia 09/06/2015   Dyspnea    Dyspnea on exertion 09/06/2015   Dysrhythmia    Essential hypertension    Exertional dyspnea 09/06/2015   GERD (gastroesophageal reflux disease)    History of pulmonary embolism 07/31/2022   Hypertension     Hypothyroidism    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   ILD (interstitial lung disease) (HCC) 04/07/2020   Ischemic cardiomyopathy 10/24/2016   Overview:  Ejection fraction 45% in spring 2018   Myocardial infarction [facility])    Near syncope    Nocturnal hypoxia 11/21/2023   Nonsustained ventricular tachycardia (HCC) 07/17/2018   OSA (obstructive sleep apnea)    OSA on CPAP 11/14/2015   CPAP 13    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   Paroxysmal atrial fibrillation (HCC) 09/09/2019   Pulmonary embolism (HCC)    Restless leg syndrome 11/30/2016   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    Past Surgical History:  Procedure Laterality Date   CATARACT EXTRACTION  2012   COLONOSCOPY  2013   CORONARY ARTERY BYPASS GRAFT N/A 08/13/2019   Procedure: CORONARY ARTERY BYPASS GRAFTING (CABG) TIMES 3.;  Surgeon: German Bartlett PEDLAR, MD;  Location: [facility] OR;  Service: Open Heart Surgery;  Laterality: N/A;   CORONARY STENT PLACEMENT     heart stent Left 09/2015   HERNIA REPAIR  11/1977   LEFT HEART CATH AND CORONARY ANGIOGRAPHY N/A 08/06/2019   Procedure: LEFT HEART CATH AND CORONARY ANGIOGRAPHY;  Surgeon: Burnard Debby LABOR, MD;  Location: [facility] INVASIVE CV LAB;  Service: Cardiovascular;  Laterality: N/A;   LEFT HEART CATH AND CORS/GRAFTS ANGIOGRAPHY N/A 04/30/2023   Procedure: LEFT HEART CATH AND CORS/GRAFTS ANGIOGRAPHY;  Surgeon: Wendel Lurena POUR, MD;  Location: [facility] INVASIVE CV LAB;  Service: Cardiovascular;  Laterality: N/A;   RADIAL ARTERY HARVEST Left 08/13/2019   Procedure: OPEN RADIAL ARTERY HARVEST;  Surgeon: German Bartlett PEDLAR, MD;  Location: [facility] OR;  Service: Open Heart Surgery;  Laterality: Left;   REPAIR OF PATENT FORAMEN OVALE N/A 08/13/2019   Procedure: CLOSURE OF PATENT FORAMEN OVALE;  Surgeon: German Bartlett PEDLAR, MD;  Location: [facility] OR;  Service: Open Heart Surgery;  Laterality: N/A;   RTC repair  Right 11/03/2020    SURGERY FOR BOWEL ABSCESS  10/1959   TEE WITHOUT CARDIOVERSION N/A 08/13/2019   Procedure: TRANSESOPHAGEAL ECHOCARDIOGRAM (TEE);  Surgeon: German Bartlett PEDLAR, MD;  Location: [facility] OR;  Service: Open Heart Surgery;  Laterality: N/A;    Current Medications: Current Meds  Medication Sig   apixaban  (ELIQUIS ) 5 MG TABS tablet Take 1 tablet (5 mg total) by mouth 2 (two) times daily.   Calcium  Carb-Cholecalciferol (CALCIUM  600+D3 PO) Take 1 tablet by mouth daily at 12 noon.   clopidogrel  (PLAVIX ) 75 MG tablet Take 1 tablet by mouth once daily   dexlansoprazole (DEXILANT) 60 MG capsule Take 60 mg by mouth daily before breakfast.   furosemide  (LASIX ) 40 MG tablet Take 1 tablet (40 mg total) by mouth 2 (two) times daily.   isosorbide  mononitrate (IMDUR ) 30 MG 24 hr tablet Take 1 tablet (30 mg total) by mouth daily.   levothyroxine  (SYNTHROID ) 75 MCG tablet Take 75 mcg by mouth daily before breakfast.   mexiletine (MEXITIL) 250 MG capsule Take 1 capsule (  250 mg total) by mouth 2 (two) times daily.   Multiple Vitamin (MULTIVITAMIN WITH MINERALS) TABS tablet Take 1 tablet by mouth daily at 12 noon. Unknown strength per patient   nitroGLYCERIN  (NITROSTAT ) 0.4 MG SL tablet Place 1 tablet (0.4 mg total) under the tongue every 5 (five) minutes as needed for chest pain.   potassium chloride  (KLOR-CON ) 10 MEQ tablet Take 1 tablet by mouth once daily   pramipexole  (MIRAPEX ) 1.5 MG tablet Take 1.5 mg by mouth at bedtime.    rosuvastatin  (CRESTOR ) 20 MG tablet Take 1 tablet (20 mg total) by mouth daily.   sacubitril -valsartan  (ENTRESTO ) 24-26 MG Take 1 tablet by mouth 2 (two) times daily.   TRELEGY ELLIPTA  100-62.5-25 MCG/ACT AEPB Inhale 1 puff into the lungs daily.     Allergies:   Patient has no known allergies.   Social History   Socioeconomic History   Marital status: Married    Spouse name: Not on file   Number of children: Not on file   Years of education: Not on file   Highest education level: Not  on file  Occupational History   Not on file  Tobacco Use   Smoking status: Former    Current packs/day: 0.00    Average packs/day: 0.5 packs/day for 4.0 years (2.0 ttl pk-yrs)    Types: Cigarettes    Start date: 06/11/1954    Quit date: 06/11/1958    Years since quitting: 65.5   Smokeless tobacco: Former    Types: Chew    Quit date: 12/01/1958  Vaping Use   Vaping status: Never Used  Substance and Sexual Activity   Alcohol  use: No    Alcohol /week: 0.0 standard drinks of alcohol    Drug use: No   Sexual activity: Not on file  Other Topics Concern   Not on file  Social History Narrative   Are you right handed or left handed? Right    Are you currently employed ? Retired    What is your current occupation?   Do you live at home alone?no    Who lives with you? spouse   What type of home do you live in: 1 story or 2 story? 1 story        Social Drivers of Corporate investment banker Strain: Not on file  Food Insecurity: Not on file  Transportation Needs: Not on file  Physical Activity: Not on file  Stress: Not on file  Social Connections: Not on file     Family History: The patient's family history includes Asthma in his brother and maternal grandmother; Heart attack in his brother; Heart disease in his brother; Stroke in his father. ROS:   Please see the history of present illness.    All 14 point review of systems negative except as described per history of present illness  EKGs/Labs/Other Studies Reviewed:         Recent Labs: 04/22/2023: Hemoglobin 12.4; Platelets 235 06/24/2023: NT-Pro BNP 301 08/05/2023: BUN 22; Creatinine, Ser 1.11; Potassium 4.6; Sodium 144  Recent Lipid Panel    Component Value Date/Time   CHOL 128 04/12/2020 0913   TRIG 111 04/12/2020 0913   HDL 48 04/12/2020 0913   CHOLHDL 2.7 04/12/2020 0913   CHOLHDL 2.5 08/13/2019 0603   VLDL 25 08/13/2019 0603   LDLCALC 60 04/12/2020 0913    Physical Exam:    VS:  BP (!) 108/48 (BP Location:  Left Arm, Patient Position: Sitting)   Pulse 65   Ht 5'  10 (1.778 m)   Wt 178 lb (80.7 kg)   SpO2 98%   BMI 25.54 kg/m     Wt Readings from Last 3 Encounters:  12/30/23 178 lb (80.7 kg)  12/26/23 179 lb 3.2 oz (81.3 kg)  11/29/23 181 lb (82.1 kg)     GEN:  Well nourished, well developed in no acute distress HEENT: Normal NECK: No JVD; No carotid bruits LYMPHATICS: No lymphadenopathy CARDIAC: RRR, no murmurs, no rubs, no gallops RESPIRATORY:  Clear to auscultation without rales, wheezing or rhonchi  ABDOMEN: Soft, non-tender, non-distended MUSCULOSKELETAL:  No edema; No deformity  SKIN: Warm and dry LOWER EXTREMITIES: no swelling NEUROLOGIC:  Alert and oriented x 3 PSYCHIATRIC:  Normal affect   ASSESSMENT:    1. Atherosclerosis of coronary artery bypass graft(s), unspecified, with other forms of angina pectoris (HCC)   2. Coronary artery disease involving coronary bypass graft of native heart with angina pectoris (HCC)   3. Paroxysmal atrial fibrillation (HCC)   4. S/P CABG x 3   5. Dyslipidemia    PLAN:    In order of problems listed above:  Coronary disease with some atypical symptoms.  Will continue Imdur  30 mg daily. Paroxysmal atrial fibrillation.  Stable from that point review appropriate guideline directed medical therapy. [facility] spotted fever: On doxycycline doing better. Low blood pressure which I suspect is combination of multiple issue including [place] spotted fever.  I will discontinue his Entresto  temporarily put him on the losartan  25 bring him back in 2 months   Medication Adjustments/Labs and Tests Ordered: Current medicines are reviewed at length with the patient today.  Concerns regarding medicines are outlined above.  No orders of the defined types were placed in this encounter.  Medication changes: No orders of the defined types were placed in this encounter.   Signed, Lamar DOROTHA Fitch, MD, [facility] 12/30/2023 3:56 PM    [facility]

## 2024-01-01 DIAGNOSIS — M2241 Chondromalacia patellae, right knee: Secondary | ICD-10-CM | POA: Diagnosis not present

## 2024-01-08 DIAGNOSIS — U071 COVID-19: Secondary | ICD-10-CM | POA: Diagnosis not present

## 2024-01-08 DIAGNOSIS — R5381 Other malaise: Secondary | ICD-10-CM | POA: Diagnosis not present

## 2024-01-08 DIAGNOSIS — Z6826 Body mass index (BMI) 26.0-26.9, adult: Secondary | ICD-10-CM | POA: Diagnosis not present

## 2024-01-08 DIAGNOSIS — Z20822 Contact with and (suspected) exposure to covid-19: Secondary | ICD-10-CM | POA: Diagnosis not present

## 2024-01-09 ENCOUNTER — Ambulatory Visit: Admitting: Cardiology

## 2024-01-10 DIAGNOSIS — E785 Hyperlipidemia, unspecified: Secondary | ICD-10-CM | POA: Diagnosis not present

## 2024-01-10 DIAGNOSIS — I10 Essential (primary) hypertension: Secondary | ICD-10-CM | POA: Diagnosis not present

## 2024-01-11 ENCOUNTER — Other Ambulatory Visit: Payer: Self-pay | Admitting: Cardiology

## 2024-01-30 ENCOUNTER — Encounter: Payer: Self-pay | Admitting: Cardiology

## 2024-01-30 ENCOUNTER — Ambulatory Visit: Attending: Cardiology | Admitting: Cardiology

## 2024-01-30 ENCOUNTER — Ambulatory Visit: Admitting: Cardiology

## 2024-01-30 VITALS — BP 138/68 | HR 51 | Ht 70.0 in | Wt 175.8 lb

## 2024-01-30 DIAGNOSIS — I493 Ventricular premature depolarization: Secondary | ICD-10-CM | POA: Diagnosis not present

## 2024-01-30 DIAGNOSIS — I2510 Atherosclerotic heart disease of native coronary artery without angina pectoris: Secondary | ICD-10-CM

## 2024-01-30 DIAGNOSIS — D6869 Other thrombophilia: Secondary | ICD-10-CM | POA: Diagnosis not present

## 2024-01-30 DIAGNOSIS — I480 Paroxysmal atrial fibrillation: Secondary | ICD-10-CM | POA: Diagnosis not present

## 2024-01-30 NOTE — Progress Notes (Signed)
 Electrophysiology Office Note:   Date:  01/30/2024  ID:  Shane Massey, DOB [birth year], MRN [id number]  Primary Cardiologist: Lamar Fitch, MD Primary Heart Failure: None Electrophysiologist: Arra Connaughton [name], MD      History of Present Illness:   Shane Massey is a 83 y.o. male with h/o coronary artery disease, chronic systolic heart failure, sleep apnea, atrial fibrillation, hypertension, hyperlipidemia seen today for routine electrophysiology followup.   Since last being seen in our clinic the patient reports doing overall well.  He has no chest pain.  He does get mildly short of breath when he exerts himself, but otherwise has no acute complaints.  He did not short of breath walking into the building today.  He gets short of breath when walking to his house 125 feet and climbing 6 stairs.  He does go to the gym and has no issues working out at [employer]  he denies chest pain, palpitations, dyspnea, PND, orthopnea, nausea, vomiting, dizziness, syncope, edema, weight gain, or early satiety.   Review of systems complete and found to be negative unless listed in HPI.   EP Information / Studies Reviewed:    EKG is ordered today. Personal review as below.  EKG Interpretation Date/Time:  Thursday January 30 2024 09:55:04 EDT Ventricular Rate:  51 PR Interval:  136 QRS Duration:  98 QT Interval:  442 QTC Calculation: 407 R Axis:   -39  Text Interpretation: Sinus bradycardia Left axis deviation Nonspecific ST and T wave abnormality When compared with ECG of 16-Sep-2023 11:41, Premature ventricular complexes are no longer Present Criteria for Septal infarct are no longer Present ST no longer depressed in Anterior leads T wave inversion less evident in Lateral leads Confirmed by [name] on 01/30/2024 10:03:23 AM     Risk Assessment/Calculations:    CHA2DS2-VASc Score = 5   This indicates a 7.2% annual risk of stroke. The patient's score is based upon: CHF History:  1 HTN History: 1 Diabetes History: 0 Stroke History: 0 Vascular Disease History: 1 Age Score: 2 Gender Score: 0            Physical Exam:   VS:  BP 138/68   Pulse (!) 51   Ht 5' 10 (1.778 m)   Wt 175 lb 12.8 oz (79.7 kg)   SpO2 97%   BMI 25.22 kg/m    Wt Readings from Last 3 Encounters:  01/30/24 175 lb 12.8 oz (79.7 kg)  12/30/23 178 lb (80.7 kg)  12/26/23 179 lb 3.2 oz (81.3 kg)     GEN: Well nourished, well developed in no acute distress NECK: No JVD; No carotid bruits CARDIAC: Regular rate and rhythm with occasional ectopy, no murmurs, rubs, gallops RESPIRATORY:  Clear to auscultation without rales, wheezing or rhonchi  ABDOMEN: Soft, non-tender, non-distended EXTREMITIES:  No edema; No deformity   ASSESSMENT AND PLAN:    1.  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  Cardiac monitor with VT episodes lasting up to 45 seconds.  Recent cardiac monitor with a significantly elevated burden of PVCs.  He is not have PVCs on his EKG today but did have minimal ectopy on auscultation.  For now, we [name] continue with his current dose of mexiletine.  If he does continue to have PVCs with symptoms, or a decrease in his ejection fraction, switching to sotalol versus ablation would be a potential option.  2.  Coronary disease: Post CABG x 3.  On nitroglycerin  and Imdur .  Plan per primary cardiology.  3.  Interstitial lung disease: Followed by internal medicine.  Hoping to avoid amiodarone   4.  Paroxysmal atrial fibrillation: Has minimal episodes  5.  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 Inocencio in 6 months  Signed, [name], MD

## 2024-02-10 DIAGNOSIS — I10 Essential (primary) hypertension: Secondary | ICD-10-CM | POA: Diagnosis not present

## 2024-02-10 DIAGNOSIS — E785 Hyperlipidemia, unspecified: Secondary | ICD-10-CM | POA: Diagnosis not present

## 2024-02-25 ENCOUNTER — Ambulatory Visit: Attending: Cardiology | Admitting: Cardiology

## 2024-02-25 ENCOUNTER — Encounter: Payer: Self-pay | Admitting: Cardiology

## 2024-02-25 VITALS — BP 130/60 | HR 58 | Ht 70.0 in | Wt 177.8 lb

## 2024-02-25 DIAGNOSIS — I25709 Atherosclerosis of coronary artery bypass graft(s), unspecified, with unspecified angina pectoris: Secondary | ICD-10-CM

## 2024-02-25 DIAGNOSIS — I480 Paroxysmal atrial fibrillation: Secondary | ICD-10-CM

## 2024-02-25 DIAGNOSIS — L570 Actinic keratosis: Secondary | ICD-10-CM | POA: Diagnosis not present

## 2024-02-25 DIAGNOSIS — E785 Hyperlipidemia, unspecified: Secondary | ICD-10-CM | POA: Diagnosis not present

## 2024-02-25 DIAGNOSIS — Z86711 Personal history of pulmonary embolism: Secondary | ICD-10-CM | POA: Diagnosis not present

## 2024-02-25 DIAGNOSIS — D485 Neoplasm of uncertain behavior of skin: Secondary | ICD-10-CM | POA: Diagnosis not present

## 2024-02-25 DIAGNOSIS — L578 Other skin changes due to chronic exposure to nonionizing radiation: Secondary | ICD-10-CM | POA: Diagnosis not present

## 2024-02-25 DIAGNOSIS — R0609 Other forms of dyspnea: Secondary | ICD-10-CM

## 2024-02-25 DIAGNOSIS — C44629 Squamous cell carcinoma of skin of left upper limb, including shoulder: Secondary | ICD-10-CM | POA: Diagnosis not present

## 2024-02-25 DIAGNOSIS — I4729 Other ventricular tachycardia: Secondary | ICD-10-CM

## 2024-02-25 DIAGNOSIS — L821 Other seborrheic keratosis: Secondary | ICD-10-CM | POA: Diagnosis not present

## 2024-02-25 DIAGNOSIS — I2782 Chronic pulmonary embolism: Secondary | ICD-10-CM

## 2024-02-25 DIAGNOSIS — G4733 Obstructive sleep apnea (adult) (pediatric): Secondary | ICD-10-CM | POA: Diagnosis not present

## 2024-02-25 NOTE — Progress Notes (Unsigned)
 Cardiology Office Note:    Date:  02/25/2024   ID:  Shane Massey, DOB 03-May-1941, MRN [id number]  PCP:  Trinidad Glisson, MD  Cardiologist:  Lamar Fitch, MD    Referring MD: Trinidad Glisson, MD   No chief complaint on file.   History of Present Illness:    Shane Massey is a 83 y.o. male   with past medical history significant for coronary artery disease.  In March 2017 he required PTCA and stenting of the mid LAD he also have history of ischemic cardiomyopathy however ejection fraction determined in January 2020 was normal.  He been complaining of fatigue tiredness for long period time eventually we decided to pursue cardiac catheterization.  That showed 60 to 75% stenosis of left main, 80% stenosis of ramus intermedius, 50% stenosis first diagonal branch on 08/13/2019 he ended up having coronary bypass graft with LIMA to LAD, left radial to second obtuse marginal branch and RIMA to ramus intermedius at the same time he did have PFO closure.  Recovery was complicated by an episode of atrial fibrillation successfully managed with  He has been struggling for a while with shortness of breath he had difficulty finding out what the problem was extensive evaluation has been performed included echocardiogram as well as stress test which was negative eventually D-dimer has been performed which was high CT of the chest showed pulmonary emboli.  End up being admitted to the hospital put on anticoagulation and discharged home however after that he end up 1 more time in the emergency room with shortness of breath no new pulmonary emboli.  Recently he started having some strength symptoms monitor has been placed he was find to have nonsustained ventricular tachycardia but frequent episodes, he was seen by my colleague Dr. Inocencio in EP consultation.  He was put on mexiletine Comes today to my office for follow-up.  Still complain of being weak tired exhausted.  He said walking to his shop is a big  effort.  Still goes to [facility] once a week and trying to exercise some.  No dizziness no passing out no chest pain  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   Atherosclerosis of coronary artery bypass graft(s), unspecified, with other forms of angina pectoris (HCC) 07/07/2021   CAD (coronary artery disease) 08/12/2019   CHF (congestive heart failure) (HCC)    Coronary artery disease    Coronary artery disease involving coronary bypass graft of native heart with angina pectoris (HCC) 07/07/2021   Dependence on other enabling machines and devices 07/07/2021   Dizziness 06/14/2017   Dyslipidemia 09/06/2015   Dyspnea    Dyspnea on exertion 09/06/2015   Dysrhythmia    Essential hypertension    Exertional dyspnea 09/06/2015   GERD (gastroesophageal reflux disease)    History of pulmonary embolism 07/31/2022   Hypertension    Hypothyroidism    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   ILD (interstitial lung disease) (HCC) 04/07/2020   Ischemic cardiomyopathy 10/24/2016   Overview:  Ejection fraction 45% in spring 2018   Myocardial infarction [facility])    Near syncope    Nocturnal hypoxia 11/21/2023   Nonsustained ventricular tachycardia (HCC) 07/17/2018   OSA (obstructive sleep apnea)    OSA on CPAP 11/14/2015   CPAP 13    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   Paroxysmal atrial fibrillation (HCC) 09/09/2019   Pulmonary embolism (HCC)    Restless leg syndrome  11/30/2016   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    Past Surgical History:  Procedure Laterality Date   CATARACT EXTRACTION  2012   COLONOSCOPY  2013   CORONARY ARTERY BYPASS GRAFT N/A 08/13/2019   Procedure: CORONARY ARTERY BYPASS GRAFTING (CABG) TIMES 3.;  Surgeon: German Bartlett PEDLAR, MD;  Location: [facility] OR;  Service: Open Heart Surgery;  Laterality: N/A;   CORONARY STENT PLACEMENT     heart stent Left 09/2015    HERNIA REPAIR  11/1977   LEFT HEART CATH AND CORONARY ANGIOGRAPHY N/A 08/06/2019   Procedure: LEFT HEART CATH AND CORONARY ANGIOGRAPHY;  Surgeon: Burnard Debby LABOR, MD;  Location: [facility] INVASIVE CV LAB;  Service: Cardiovascular;  Laterality: N/A;   LEFT HEART CATH AND CORS/GRAFTS ANGIOGRAPHY N/A 04/30/2023   Procedure: LEFT HEART CATH AND CORS/GRAFTS ANGIOGRAPHY;  Surgeon: Wendel Lurena POUR, MD;  Location: [facility] INVASIVE CV LAB;  Service: Cardiovascular;  Laterality: N/A;   RADIAL ARTERY HARVEST Left 08/13/2019   Procedure: OPEN RADIAL ARTERY HARVEST;  Surgeon: German Bartlett PEDLAR, MD;  Location: [facility] OR;  Service: Open Heart Surgery;  Laterality: Left;   REPAIR OF PATENT FORAMEN OVALE N/A 08/13/2019   Procedure: CLOSURE OF PATENT FORAMEN OVALE;  Surgeon: German Bartlett PEDLAR, MD;  Location: [facility] OR;  Service: Open Heart Surgery;  Laterality: N/A;   RTC repair  Right 11/03/2020   SURGERY FOR BOWEL ABSCESS  10/1959   TEE WITHOUT CARDIOVERSION N/A 08/13/2019   Procedure: TRANSESOPHAGEAL ECHOCARDIOGRAM (TEE);  Surgeon: German Bartlett PEDLAR, MD;  Location: [facility] OR;  Service: Open Heart Surgery;  Laterality: N/A;    Current Medications: Current Meds  Medication Sig   apixaban  (ELIQUIS ) 5 MG TABS tablet Take 1 tablet (5 mg total) by mouth 2 (two) times daily.   Calcium  Carb-Cholecalciferol (CALCIUM  600+D3 PO) Take 1 tablet by mouth daily at 12 noon.   clopidogrel  (PLAVIX ) 75 MG tablet Take 1 tablet by mouth once daily   dexlansoprazole (DEXILANT) 60 MG capsule Take 60 mg by mouth daily before breakfast.   furosemide  (LASIX ) 40 MG tablet Take 1 tablet (40 mg total) by mouth 2 (two) times daily.   isosorbide  mononitrate (IMDUR ) 30 MG 24 hr tablet Take 1 tablet (30 mg total) by mouth daily.   levothyroxine  (SYNTHROID ) 75 MCG tablet Take 75 mcg by mouth daily before breakfast.   losartan  (COZAAR ) 25 MG tablet Take 1 tablet (25 mg total) by mouth daily.   mexiletine (MEXITIL) 250 MG capsule Take 1 capsule (250 mg total) by  mouth 2 (two) times daily.   Multiple Vitamin (MULTIVITAMIN WITH MINERALS) TABS tablet Take 1 tablet by mouth daily at 12 noon. Unknown strength per patient   nitroGLYCERIN  (NITROSTAT ) 0.4 MG SL tablet Place 1 tablet (0.4 mg total) under the tongue every 5 (five) minutes as needed for chest pain.   potassium chloride  (KLOR-CON ) 10 MEQ tablet Take 1 tablet by mouth once daily   pramipexole  (MIRAPEX ) 1.5 MG tablet Take 1.5 mg by mouth at bedtime.    rosuvastatin  (CRESTOR ) 20 MG tablet Take 1 tablet (20 mg total) by mouth daily.   TRELEGY ELLIPTA  100-62.5-25 MCG/ACT AEPB Inhale 1 puff into the lungs daily.     Allergies:   Patient has no known allergies.   Social History   Socioeconomic History   Marital status: Married    Spouse name: Not on file   Number of children: Not on file   Years of education: Not on file   Highest education level: Not on  file  Occupational History   Not on file  Tobacco Use   Smoking status: Former    Current packs/day: 0.00    Average packs/day: 0.5 packs/day for 4.0 years (2.0 ttl pk-yrs)    Types: Cigarettes    Start date: 06/11/1954    Quit date: 06/11/1958    Years since quitting: 65.7   Smokeless tobacco: Former    Types: Chew    Quit date: 12/01/1958  Vaping Use   Vaping status: Never Used  Substance and Sexual Activity   Alcohol  use: No    Alcohol /week: 0.0 standard drinks of alcohol    Drug use: No   Sexual activity: Not on file  Other Topics Concern   Not on file  Social History Narrative   Are you right handed or left handed? Right    Are you currently employed ? Retired    What is your current occupation?   Do you live at home alone?no    Who lives with you? spouse   What type of home do you live in: 1 story or 2 story? 1 story        Social Drivers of Corporate investment banker Strain: Not on file  Food Insecurity: Not on file  Transportation Needs: Not on file  Physical Activity: Not on file  Stress: Not on file  Social  Connections: Not on file     Family History: The patient's family history includes Asthma in his brother and maternal grandmother; Heart attack in his brother; Heart disease in his brother; Stroke in his father. ROS:   Please see the history of present illness.    All 14 point review of systems negative except as described per history of present illness  EKGs/Labs/Other Studies Reviewed:    EKG Interpretation Date/Time:  Tuesday February 25 2024 09:07:17 EDT Ventricular Rate:  58 PR Interval:  132 QRS Duration:  98 QT Interval:  450 QTC Calculation: 441 R Axis:   -28  Text Interpretation: Sinus bradycardia Incomplete right bundle branch block Anterior infarct , age undetermined Abnormal ECG When compared with ECG of 30-Jan-2024 09:55, Anterior infarct is now Present Confirmed by Bernie Charleston [id number]) on 02/25/2024 9:21:58 AM    Recent Labs: 04/22/2023: Hemoglobin 12.4; Platelets 235 06/24/2023: NT-Pro BNP 301 08/05/2023: BUN 22; Creatinine, Ser 1.11; Potassium 4.6; Sodium 144  Recent Lipid Panel    Component Value Date/Time   CHOL 128 04/12/2020 0913   TRIG 111 04/12/2020 0913   HDL 48 04/12/2020 0913   CHOLHDL 2.7 04/12/2020 0913   CHOLHDL 2.5 08/13/2019 0603   VLDL 25 08/13/2019 0603   LDLCALC 60 04/12/2020 0913    Physical Exam:    VS:  BP 130/60   Pulse (!) 58   Ht 5' 10 (1.778 m)   Wt 177 lb 12.8 oz (80.6 kg)   SpO2 97%   BMI 25.51 kg/m     Wt Readings from Last 3 Encounters:  02/25/24 177 lb 12.8 oz (80.6 kg)  01/30/24 175 lb 12.8 oz (79.7 kg)  12/30/23 178 lb (80.7 kg)     GEN:  Well nourished, well developed in no acute distress HEENT: Normal NECK: No JVD; No carotid bruits LYMPHATICS: No lymphadenopathy CARDIAC: RRR, no murmurs, no rubs, no gallops RESPIRATORY:  Clear to auscultation without rales, wheezing or rhonchi  ABDOMEN: Soft, non-tender, non-distended MUSCULOSKELETAL:  No edema; No deformity  SKIN: Warm and dry LOWER EXTREMITIES: no  swelling NEUROLOGIC:  Alert and oriented x 3 PSYCHIATRIC:  Normal affect   ASSESSMENT:    1. Coronary artery disease involving coronary bypass graft of native heart with angina pectoris (HCC)   2. Nonsustained ventricular tachycardia (HCC)   3. Paroxysmal atrial fibrillation (HCC)   4. Other chronic pulmonary embolism without acute cor pulmonale (HCC)   5. OSA on CPAP   6. Dyslipidemia   7. History of pulmonary embolism    PLAN:    In order of problems listed above:  Profound weakness fatigue still unexplained etiology.  He does have frequent PVCs that may be contributing to his symptomatology.  I will ask him to have echocardiogram done especially since his EKG today show possibility of anterior septal wall MI that was not present there before. Frequent ventricular ectopy with no sustained ventricular tachycardia followed by our EP team, on mexiletine will continue for now. Paroxysmal atrial fibrillation, he is on Eliquis  which I continue denies having episode of palpitations lately. Dyslipidemia I did review K PN which show me his LDL at 54 HDL 46 we will continue present management. History of pulmonary emboli continue anticoagulation   Medication Adjustments/Labs and Tests Ordered: Current medicines are reviewed at length with the patient today.  Concerns regarding medicines are outlined above.  Orders Placed This Encounter  Procedures   EKG 12-Lead   Medication changes: No orders of the defined types were placed in this encounter.   Signed, Lamar DOROTHA Fitch, MD, [facility] 02/25/2024 9:35 AM    [facility]

## 2024-02-25 NOTE — Patient Instructions (Addendum)
 Medication Instructions:  Your physician recommends that you continue on your current medications as directed. Please refer to the Current Medication list given to you today.  *If you need a refill on your cardiac medications before your next appointment, please call your pharmacy*   Lab Work: None Ordered If you have labs (blood work) drawn today and your tests are completely normal, you will receive your results only by: MyChart Message (if you have MyChart) OR A paper copy in the mail If you have any lab test that is abnormal or we need to change your treatment, we will call you to review the results.   Testing/Procedures: Your physician has requested that you have an echocardiogram. Echocardiography is a painless test that uses sound waves to create images of your heart. It provides your doctor with information about the size and shape of your heart and how well your heart's chambers and valves are working. This procedure takes approximately one hour. There are no restrictions for this procedure. Please do NOT wear cologne, perfume, aftershave, or lotions (deodorant is allowed). Please arrive 15 minutes prior to your appointment time.  Please note: We ask at that you not bring children with you during ultrasound (echo/ vascular) testing. Due to room size and safety concerns, children are not allowed in the ultrasound rooms during exams. Our front office staff cannot provide observation of children in our lobby area while testing is being conducted. An adult accompanying a patient to their appointment will only be allowed in the ultrasound room at the discretion of the ultrasound technician under special circumstances. We apologize for any inconvenience.    Follow-Up: At [facility], you and your health needs are our priority.  As part of our continuing mission to provide you with exceptional heart care, we have created designated Provider Care Teams.  These Care Teams include your  primary Cardiologist (physician) and Advanced Practice Providers (APPs -  Physician Assistants and Nurse Practitioners) who all work together to provide you with the care you need, when you need it.  We recommend signing up for the patient portal called "MyChart".  Sign up information is provided on this After Visit Summary.  MyChart is used to connect with patients for Virtual Visits (Telemedicine).  Patients are able to view lab/test results, encounter notes, upcoming appointments, etc.  Non-urgent messages can be sent to your provider as well.   To learn more about what you can do with MyChart, go to [email or web address].    Your next appointment:   4 month(s)  The format for your next appointment:   In Person  Provider:   Gypsy Balsam, MD    Other Instructions NA

## 2024-03-09 ENCOUNTER — Ambulatory Visit: Admitting: Cardiology

## 2024-03-11 DIAGNOSIS — E785 Hyperlipidemia, unspecified: Secondary | ICD-10-CM | POA: Diagnosis not present

## 2024-03-11 DIAGNOSIS — R7303 Prediabetes: Secondary | ICD-10-CM | POA: Diagnosis not present

## 2024-03-19 ENCOUNTER — Ambulatory Visit: Attending: Cardiology

## 2024-03-19 DIAGNOSIS — R0609 Other forms of dyspnea: Secondary | ICD-10-CM

## 2024-03-19 LAB — ECHOCARDIOGRAM COMPLETE
AR max vel: 2.25 cm2
AV Area VTI: 2.35 cm2
AV Area mean vel: 2.15 cm2
AV Mean grad: 2.3 mmHg
AV Peak grad: 4.3 mmHg
Ao pk vel: 1.04 m/s
Area-P 1/2: 2.11 cm2
MV VTI: 1.48 cm2
S' Lateral: 4.1 cm

## 2024-03-25 ENCOUNTER — Ambulatory Visit: Payer: Self-pay | Admitting: Cardiology

## 2024-04-07 NOTE — Telephone Encounter (Signed)
 Wife is calling to get echo results

## 2024-04-11 DIAGNOSIS — R7303 Prediabetes: Secondary | ICD-10-CM | POA: Diagnosis not present

## 2024-04-11 DIAGNOSIS — E785 Hyperlipidemia, unspecified: Secondary | ICD-10-CM | POA: Diagnosis not present

## 2024-04-22 DIAGNOSIS — J849 Interstitial pulmonary disease, unspecified: Secondary | ICD-10-CM | POA: Diagnosis not present

## 2024-04-22 DIAGNOSIS — R051 Acute cough: Secondary | ICD-10-CM | POA: Diagnosis not present

## 2024-04-22 DIAGNOSIS — R490 Dysphonia: Secondary | ICD-10-CM | POA: Diagnosis not present

## 2024-05-05 DIAGNOSIS — Z23 Encounter for immunization: Secondary | ICD-10-CM | POA: Diagnosis not present

## 2024-05-11 ENCOUNTER — Other Ambulatory Visit: Payer: Self-pay | Admitting: Cardiology

## 2024-05-11 DIAGNOSIS — R7303 Prediabetes: Secondary | ICD-10-CM | POA: Diagnosis not present

## 2024-05-11 DIAGNOSIS — I480 Paroxysmal atrial fibrillation: Secondary | ICD-10-CM

## 2024-05-11 DIAGNOSIS — E785 Hyperlipidemia, unspecified: Secondary | ICD-10-CM | POA: Diagnosis not present

## 2024-05-15 NOTE — Telephone Encounter (Signed)
 Eliquis  5mg  refill request received. Patient is 83 years old, weight-80.6kg, Crea-10.93 on 12/23/23 via [employer] tab from [facility], Diagnosis-Afib, and last seen by Dr. Bernie on 02/25/24. Dose is appropriate based on dosing criteria. Will send in refill to requested pharmacy.

## 2024-06-18 ENCOUNTER — Other Ambulatory Visit (HOSPITAL_COMMUNITY): Payer: Self-pay

## 2024-06-18 ENCOUNTER — Encounter: Payer: Self-pay | Admitting: Cardiology

## 2024-06-18 ENCOUNTER — Ambulatory Visit: Attending: Cardiology | Admitting: Cardiology

## 2024-06-18 VITALS — BP 130/56 | HR 68 | Ht 70.0 in | Wt 178.2 lb

## 2024-06-18 DIAGNOSIS — E785 Hyperlipidemia, unspecified: Secondary | ICD-10-CM | POA: Diagnosis not present

## 2024-06-18 DIAGNOSIS — I25708 Atherosclerosis of coronary artery bypass graft(s), unspecified, with other forms of angina pectoris: Secondary | ICD-10-CM | POA: Diagnosis not present

## 2024-06-18 DIAGNOSIS — G4733 Obstructive sleep apnea (adult) (pediatric): Secondary | ICD-10-CM | POA: Diagnosis not present

## 2024-06-18 DIAGNOSIS — I480 Paroxysmal atrial fibrillation: Secondary | ICD-10-CM | POA: Diagnosis not present

## 2024-06-18 DIAGNOSIS — Z951 Presence of aortocoronary bypass graft: Secondary | ICD-10-CM

## 2024-06-18 DIAGNOSIS — I25709 Atherosclerosis of coronary artery bypass graft(s), unspecified, with unspecified angina pectoris: Secondary | ICD-10-CM | POA: Diagnosis not present

## 2024-06-18 MED ORDER — NITROGLYCERIN 0.4 MG SL SUBL
0.4000 mg | SUBLINGUAL_TABLET | SUBLINGUAL | 3 refills | Status: DC | PRN
  Filled 2024-06-18: qty 25, 8d supply, fill #0

## 2024-06-18 MED ORDER — NITROGLYCERIN 0.4 MG SL SUBL: 0.4000 mg | SUBLINGUAL_TABLET | SUBLINGUAL | 3 refills | Status: AC | PRN

## 2024-06-18 NOTE — Addendum Note (Signed)
 Addended by: ARLOA MALLORY D on: 06/18/2024 09:23 AM   Modules accepted: Orders

## 2024-06-18 NOTE — Addendum Note (Signed)
 Addended by: ARLOA MALLORY D on: 06/18/2024 10:03 AM   Modules accepted: Orders

## 2024-06-18 NOTE — Progress Notes (Signed)
 " Cardiology Office Note:    Date:  06/18/2024   ID:  Shane Massey, DOB [birth year], MRN [id number]  PCP:  Trinidad Glisson, MD  Cardiologist:  Lamar Fitch, MD    Referring MD: Trinidad Glisson, MD   No chief complaint on file. Feeling fair  History of Present Illness:    Shane Massey is a 84 y.o. male   with past medical history significant for coronary artery disease.  In March 2017 he required PTCA and stenting of the mid LAD he also have history of ischemic cardiomyopathy however ejection fraction determined in January 2020 was normal.  He been complaining of fatigue tiredness for long period time eventually we decided to pursue cardiac catheterization.  That showed 60 to 75% stenosis of left main, 80% stenosis of ramus intermedius, 50% stenosis first diagonal branch on 08/13/2019 he ended up having coronary bypass graft with LIMA to LAD, left radial to second obtuse marginal branch and RIMA to ramus intermedius at the same time he did have PFO closure.  Recovery was complicated by an episode of atrial fibrillation successfully managed with  He has been struggling for a while with shortness of breath he had difficulty finding out what the problem was extensive evaluation has been performed included echocardiogram as well as stress test which was negative eventually D-dimer has been performed which was high CT of the chest showed pulmonary emboli.  End up being admitted to the hospital put on anticoagulation and discharged home however after that he end up 1 more time in the emergency room with shortness of breath no new pulmonary emboli.  Recently he started having some strength symptoms monitor has been placed he was find to have nonsustained ventricular tachycardia but frequent episodes, he was seen by my colleague Dr. Inocencio in EP consultation.  He was put on mexiletine. Last time I am seeing him he was still complaining 5 some symptoms eventually cardiac catheterization was done which  show only 1 branch of circumflex artery very small with 80% stenosis everything else seems to be nicely open except left main which Chronic significant stenosis which we knew about and that is why he got bypass surgery. He was found to be elevated LVEDP 30 mmHg  For last few months to more than a year if she is not being doing well.  Complain of being weak tired exhausted lack of energy today he comes and tells me that few days ago he got situation that he started having blurred vision lasted for few minutes and went away, also this 1 day he started feeling sick on his stomach but did not throw up.  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  There was some suspicion that maybe frequent ventricular ectopy is responsible for his symptomatology, he was seen by our EP team and they felt he was stable from that aspect.  Past Medical History:  Diagnosis Date   Angina pectoris 09/06/2015   Atherosclerosis of coronary artery bypass graft(s), unspecified, with other forms of angina pectoris 07/07/2021   CAD (coronary artery disease) 08/12/2019   CHF (congestive heart failure) (HCC)    Coronary artery disease    Coronary artery disease involving coronary bypass graft of native heart with angina pectoris 07/07/2021   Dependence on other enabling machines and devices 07/07/2021   Dizziness 06/14/2017   Dyslipidemia 09/06/2015   Dyspnea    Dyspnea on exertion 09/06/2015  Dysrhythmia    Essential hypertension    Exertional dyspnea 09/06/2015   GERD (gastroesophageal reflux disease)    History of pulmonary embolism 07/31/2022   Hypertension    Hypothyroidism    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   ILD (interstitial lung disease) (HCC) 04/07/2020   Ischemic cardiomyopathy 10/24/2016   Overview:  Ejection fraction 45% in spring 2018   Myocardial  infarction [facility])    Near syncope    Nocturnal hypoxia 11/21/2023   Nonsustained ventricular tachycardia (HCC) 07/17/2018   OSA (obstructive sleep apnea)    OSA on CPAP 11/14/2015   CPAP 13    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   Paroxysmal atrial fibrillation (HCC) 09/09/2019   Pulmonary embolism (HCC)    Restless leg syndrome 11/30/2016   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    Past Surgical History:  Procedure Laterality Date   CATARACT EXTRACTION  2012   COLONOSCOPY  2013   CORONARY ARTERY BYPASS GRAFT N/A 08/13/2019   Procedure: CORONARY ARTERY BYPASS GRAFTING (CABG) TIMES 3.;  Surgeon: German Bartlett PEDLAR, MD;  Location: [facility] OR;  Service: Open Heart Surgery;  Laterality: N/A;   CORONARY STENT PLACEMENT     heart stent Left 09/2015   HERNIA REPAIR  11/1977   LEFT HEART CATH AND CORONARY ANGIOGRAPHY N/A 08/06/2019   Procedure: LEFT HEART CATH AND CORONARY ANGIOGRAPHY;  Surgeon: Burnard Debby LABOR, MD;  Location: [facility] INVASIVE CV LAB;  Service: Cardiovascular;  Laterality: N/A;   LEFT HEART CATH AND CORS/GRAFTS ANGIOGRAPHY N/A 04/30/2023   Procedure: LEFT HEART CATH AND CORS/GRAFTS ANGIOGRAPHY;  Surgeon: Wendel Lurena POUR, MD;  Location: [facility] INVASIVE CV LAB;  Service: Cardiovascular;  Laterality: N/A;   RADIAL ARTERY HARVEST Left 08/13/2019   Procedure: OPEN RADIAL ARTERY HARVEST;  Surgeon: German Bartlett PEDLAR, MD;  Location: [facility] OR;  Service: Open Heart Surgery;  Laterality: Left;   REPAIR OF PATENT FORAMEN OVALE N/A 08/13/2019   Procedure: CLOSURE OF PATENT FORAMEN OVALE;  Surgeon: German Bartlett PEDLAR, MD;  Location: [facility] OR;  Service: Open Heart Surgery;  Laterality: N/A;   RTC repair  Right 11/03/2020   SURGERY FOR BOWEL ABSCESS  10/1959   TEE WITHOUT CARDIOVERSION N/A 08/13/2019   Procedure: TRANSESOPHAGEAL ECHOCARDIOGRAM (TEE);  Surgeon: German Bartlett PEDLAR, MD;  Location: [facility] OR;  Service: Open Heart Surgery;  Laterality: N/A;    Current Medications: Active Medications[1]   Allergies:    Patient has no known allergies.   Social History   Socioeconomic History   Marital status: Married    Spouse name: Not on file   Number of children: Not on file   Years of education: Not on file   Highest education level: Not on file  Occupational History   Not on file  Tobacco Use   Smoking status: Former    Current packs/day: 0.00    Average packs/day: 0.5 packs/day for 4.0 years (2.0 ttl pk-yrs)    Types: Cigarettes    Start date: 06/11/1954    Quit date: 06/11/1958    Years since quitting: 66.0   Smokeless tobacco: Former    Types: Chew    Quit date: 12/01/1958  Vaping Use   Vaping status: Never Used  Substance and Sexual Activity   Alcohol  use: No    Alcohol /week: 0.0 standard drinks of alcohol    Drug use: No   Sexual activity: Not on file  Other Topics Concern   Not on file  Social History  Narrative   Are you right handed or left handed? Right    Are you currently employed ? Retired    What is your current occupation?   Do you live at home alone?no    Who lives with you? spouse   What type of home do you live in: 1 story or 2 story? 1 story        Social Drivers of Health   Tobacco Use: Medium Risk (06/18/2024)   Patient History    Smoking Tobacco Use: Former    Smokeless Tobacco Use: Former    Passive Exposure: Not on Stage Manager: Not on [employer] Insecurity: Not on file  Transportation Needs: Not on file  Physical Activity: Not on file  Stress: Not on file  Social Connections: Not on file  Depression (PHQ2-9): Not on file  Alcohol  Screen: Not on file  Housing: Not on file  Utilities: Not on file  Health Literacy: Not on file     Family History: The patient's family history includes Asthma in his brother and maternal grandmother; Heart attack in his brother; Heart disease in his brother; Stroke in his father. ROS:   Please see the history of present illness.    All 14 point review of systems negative except as described per history  of present illness  EKGs/Labs/Other Studies Reviewed:         Recent Labs: 06/24/2023: NT-Pro BNP 301 08/05/2023: BUN 22; Creatinine, Ser 1.11; Potassium 4.6; Sodium 144  Recent Lipid Panel    Component Value Date/Time   CHOL 128 04/12/2020 0913   TRIG 111 04/12/2020 0913   HDL 48 04/12/2020 0913   CHOLHDL 2.7 04/12/2020 0913   CHOLHDL 2.5 08/13/2019 0603   VLDL 25 08/13/2019 0603   LDLCALC 60 04/12/2020 0913    Physical Exam:    VS:  BP (!) 130/56   Pulse 68   Ht 5' 10 (1.778 m)   Wt 178 lb 4 oz (80.9 kg)   SpO2 99%   BMI 25.58 kg/m     Wt Readings from Last 3 Encounters:  06/18/24 178 lb 4 oz (80.9 kg)  02/25/24 177 lb 12.8 oz (80.6 kg)  01/30/24 175 lb 12.8 oz (79.7 kg)     GEN:  Well nourished, well developed in no acute distress HEENT: Normal NECK: No JVD; No carotid bruits LYMPHATICS: No lymphadenopathy CARDIAC: RRR, no murmurs, no rubs, no gallops RESPIRATORY:  Clear to auscultation without rales, wheezing or rhonchi  ABDOMEN: Soft, non-tender, non-distended MUSCULOSKELETAL:  No edema; No deformity  SKIN: Warm and dry LOWER EXTREMITIES: no swelling NEUROLOGIC:  Alert and oriented x 3 PSYCHIATRIC:  Normal affect   ASSESSMENT:    1. Coronary artery disease involving coronary bypass graft of native heart with angina pectoris   2. Atherosclerosis of coronary artery bypass graft(s), unspecified, with other forms of angina pectoris   3. Paroxysmal atrial fibrillation (HCC)   4. OSA on CPAP   5. S/P CABG x 3   6. Dyslipidemia    PLAN:    In order of problems listed above:  Coronary disease stable from that point we will continue present management. Some strange sensation with vision we will schedule him to have carotic ultrasounds make sure he does not have any significant problem in therapy of however she is already on Eliquis  and Plavix  which I will continue for now. Obstructive sleep apnea using CPAP mask on the regular basis. Paroxysmal atrial  fibrillation denies have  any palpitation continue anticoagulation. History of PE anticoagulated no new issues. Frequent PVCs with ventricular tachycardia on mexiletine will continue.   Medication Adjustments/Labs and Tests Ordered: Current medicines are reviewed at length with the patient today.  Concerns regarding medicines are outlined above.  No orders of the defined types were placed in this encounter.  Medication changes: No orders of the defined types were placed in this encounter.   Signed, Lamar DOROTHA Fitch, MD, [facility] 06/18/2024 9:16 AM    [facility]    [1]  Current Meds  Medication Sig   apixaban  (ELIQUIS ) 5 MG TABS tablet Take 1 tablet by mouth twice daily   Calcium  Carb-Cholecalciferol (CALCIUM  600+D3 PO) Take 1 tablet by mouth daily at 12 noon.   clopidogrel  (PLAVIX ) 75 MG tablet Take 1 tablet by mouth once daily   dexlansoprazole (DEXILANT) 60 MG capsule Take 60 mg by mouth daily before breakfast.   furosemide  (LASIX ) 40 MG tablet Take 1 tablet (40 mg total) by mouth 2 (two) times daily.   isosorbide  mononitrate (IMDUR ) 30 MG 24 hr tablet Take 1 tablet (30 mg total) by mouth daily.   levothyroxine  (SYNTHROID ) 75 MCG tablet Take 75 mcg by mouth daily before breakfast.   losartan  (COZAAR ) 25 MG tablet Take 1 tablet by mouth once daily   mexiletine (MEXITIL) 250 MG capsule Take 1 capsule (250 mg total) by mouth 2 (two) times daily.   Multiple Vitamin (MULTIVITAMIN WITH MINERALS) TABS tablet Take 1 tablet by mouth daily at 12 noon. Unknown strength per patient   nitroGLYCERIN  (NITROSTAT ) 0.4 MG SL tablet Place 1 tablet (0.4 mg total) under the tongue every 5 (five) minutes as needed for chest pain.   potassium chloride  (KLOR-CON ) 10 MEQ tablet Take 1 tablet by mouth once daily   pramipexole  (MIRAPEX ) 1.5 MG tablet Take 1.5 mg by mouth at bedtime.    rosuvastatin  (CRESTOR ) 20 MG tablet Take 1 tablet (20 mg total) by mouth daily.   TRELEGY ELLIPTA   100-62.5-25 MCG/ACT AEPB Inhale 1 puff into the lungs daily.   "

## 2024-06-18 NOTE — Patient Instructions (Signed)
Medication Instructions:  Your physician recommends that you continue on your current medications as directed. Please refer to the Current Medication list given to you today.  *If you need a refill on your cardiac medications before your next appointment, please call your pharmacy*   Lab Work: None Ordered If you have labs (blood work) drawn today and your tests are completely normal, you will receive your results only by: MyChart Message (if you have MyChart) OR A paper copy in the mail If you have any lab test that is abnormal or we need to change your treatment, we will call you to review the results.   Testing/Procedures: Your physician has requested that you have a carotid duplex. This test is an ultrasound of the carotid arteries in your neck. It looks at blood flow through these arteries that supply the brain with blood. Allow one hour for this exam. There are no restrictions or special instructions.    Follow-Up: At [facility], you and your health needs are our priority.  As part of our continuing mission to provide you with exceptional heart care, we have created designated Provider Care Teams.  These Care Teams include your primary Cardiologist (physician) and Advanced Practice Providers (APPs -  Physician Assistants and Nurse Practitioners) who all work together to provide you with the care you need, when you need it.  We recommend signing up for the patient portal called "MyChart".  Sign up information is provided on this After Visit Summary.  MyChart is used to connect with patients for Virtual Visits (Telemedicine).  Patients are able to view lab/test results, encounter notes, upcoming appointments, etc.  Non-urgent messages can be sent to your provider as well.   To learn more about what you can do with MyChart, go to [email or web address].    Your next appointment:   3 month(s)  The format for your next appointment:   In Person  Provider:   Gypsy Balsam, MD     Other Instructions NA

## 2024-06-25 ENCOUNTER — Other Ambulatory Visit (HOSPITAL_BASED_OUTPATIENT_CLINIC_OR_DEPARTMENT_OTHER): Payer: Self-pay | Admitting: Family Medicine

## 2024-06-25 ENCOUNTER — Ambulatory Visit (INDEPENDENT_AMBULATORY_CARE_PROVIDER_SITE_OTHER)
Admission: RE | Admit: 2024-06-25 | Discharge: 2024-06-25 | Disposition: A | Discharge status: Home / Self Care | Source: Ambulatory Visit | Attending: Family Medicine | Admitting: Family Medicine

## 2024-06-25 DIAGNOSIS — R0989 Other specified symptoms and signs involving the circulatory and respiratory systems: Secondary | ICD-10-CM

## 2024-07-14 ENCOUNTER — Ambulatory Visit

## 2024-07-14 DIAGNOSIS — I25708 Atherosclerosis of coronary artery bypass graft(s), unspecified, with other forms of angina pectoris: Secondary | ICD-10-CM

## 2024-07-14 DIAGNOSIS — I6523 Occlusion and stenosis of bilateral carotid arteries: Secondary | ICD-10-CM

## 2024-07-15 ENCOUNTER — Ambulatory Visit: Payer: Self-pay | Admitting: Cardiology

## 2024-08-14 ENCOUNTER — Other Ambulatory Visit
